# Patient Record
Sex: Male | Born: 1945 | Race: White | Hispanic: No | Marital: Married | Smoking: Former smoker
Health system: Southern US, Community
[De-identification: ages and names within clinical notes are randomized; demographics above are authoritative.]

## PROBLEM LIST (undated history)

## (undated) DIAGNOSIS — T4145XA Adverse effect of unspecified anesthetic, initial encounter: Secondary | ICD-10-CM

## (undated) DIAGNOSIS — M60852 Other myositis, left thigh: Secondary | ICD-10-CM

## (undated) DIAGNOSIS — Z950 Presence of cardiac pacemaker: Secondary | ICD-10-CM

## (undated) DIAGNOSIS — I350 Nonrheumatic aortic (valve) stenosis: Secondary | ICD-10-CM

## (undated) DIAGNOSIS — G8929 Other chronic pain: Secondary | ICD-10-CM

## (undated) DIAGNOSIS — I4891 Unspecified atrial fibrillation: Secondary | ICD-10-CM

## (undated) DIAGNOSIS — I10 Essential (primary) hypertension: Secondary | ICD-10-CM

## (undated) DIAGNOSIS — R7989 Other specified abnormal findings of blood chemistry: Secondary | ICD-10-CM

## (undated) DIAGNOSIS — T8859XA Other complications of anesthesia, initial encounter: Secondary | ICD-10-CM

## (undated) DIAGNOSIS — J449 Chronic obstructive pulmonary disease, unspecified: Secondary | ICD-10-CM

## (undated) DIAGNOSIS — R040 Epistaxis: Secondary | ICD-10-CM

## (undated) DIAGNOSIS — I25810 Atherosclerosis of coronary artery bypass graft(s) without angina pectoris: Secondary | ICD-10-CM

## (undated) DIAGNOSIS — J45909 Unspecified asthma, uncomplicated: Secondary | ICD-10-CM

## (undated) DIAGNOSIS — N179 Acute kidney failure, unspecified: Secondary | ICD-10-CM

## (undated) DIAGNOSIS — E119 Type 2 diabetes mellitus without complications: Secondary | ICD-10-CM

## (undated) DIAGNOSIS — D649 Anemia, unspecified: Secondary | ICD-10-CM

## (undated) DIAGNOSIS — R42 Dizziness and giddiness: Secondary | ICD-10-CM

## (undated) DIAGNOSIS — R011 Cardiac murmur, unspecified: Secondary | ICD-10-CM

## (undated) DIAGNOSIS — Z9289 Personal history of other medical treatment: Secondary | ICD-10-CM

## (undated) DIAGNOSIS — I499 Cardiac arrhythmia, unspecified: Secondary | ICD-10-CM

## (undated) DIAGNOSIS — K7460 Unspecified cirrhosis of liver: Secondary | ICD-10-CM

## (undated) DIAGNOSIS — M5030 Other cervical disc degeneration, unspecified cervical region: Secondary | ICD-10-CM

## (undated) DIAGNOSIS — M5136 Other intervertebral disc degeneration, lumbar region: Secondary | ICD-10-CM

## (undated) DIAGNOSIS — Z9981 Dependence on supplemental oxygen: Secondary | ICD-10-CM

## (undated) DIAGNOSIS — I5033 Acute on chronic diastolic (congestive) heart failure: Secondary | ICD-10-CM

## (undated) DIAGNOSIS — I442 Atrioventricular block, complete: Secondary | ICD-10-CM

## (undated) DIAGNOSIS — E871 Hypo-osmolality and hyponatremia: Secondary | ICD-10-CM

## (undated) DIAGNOSIS — E785 Hyperlipidemia, unspecified: Secondary | ICD-10-CM

## (undated) DIAGNOSIS — J189 Pneumonia, unspecified organism: Secondary | ICD-10-CM

## (undated) DIAGNOSIS — R945 Abnormal results of liver function studies: Secondary | ICD-10-CM

## (undated) DIAGNOSIS — K861 Other chronic pancreatitis: Secondary | ICD-10-CM

## (undated) DIAGNOSIS — R0602 Shortness of breath: Secondary | ICD-10-CM

## (undated) DIAGNOSIS — M069 Rheumatoid arthritis, unspecified: Secondary | ICD-10-CM

## (undated) DIAGNOSIS — Z952 Presence of prosthetic heart valve: Secondary | ICD-10-CM

## (undated) DIAGNOSIS — K219 Gastro-esophageal reflux disease without esophagitis: Secondary | ICD-10-CM

## (undated) DIAGNOSIS — I4892 Unspecified atrial flutter: Secondary | ICD-10-CM

## (undated) DIAGNOSIS — M1990 Unspecified osteoarthritis, unspecified site: Secondary | ICD-10-CM

## (undated) DIAGNOSIS — K766 Portal hypertension: Secondary | ICD-10-CM

## (undated) DIAGNOSIS — I2510 Atherosclerotic heart disease of native coronary artery without angina pectoris: Secondary | ICD-10-CM

## (undated) DIAGNOSIS — I5032 Chronic diastolic (congestive) heart failure: Secondary | ICD-10-CM

## (undated) DIAGNOSIS — M549 Dorsalgia, unspecified: Secondary | ICD-10-CM

## (undated) DIAGNOSIS — Z951 Presence of aortocoronary bypass graft: Secondary | ICD-10-CM

## (undated) HISTORY — PX: BACK SURGERY: SHX140

## (undated) HISTORY — DX: Abnormal results of liver function studies: R94.5

## (undated) HISTORY — DX: Chronic obstructive pulmonary disease, unspecified: J44.9

## (undated) HISTORY — DX: Other specified abnormal findings of blood chemistry: R79.89

## (undated) HISTORY — PX: INSERT / REPLACE / REMOVE PACEMAKER: SUR710

## (undated) HISTORY — DX: Rheumatoid arthritis, unspecified: M06.9

## (undated) HISTORY — DX: Essential (primary) hypertension: I10

---
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
## 1997-03-27 DIAGNOSIS — Z951 Presence of aortocoronary bypass graft: Secondary | ICD-10-CM

## 1997-03-27 HISTORY — DX: Presence of aortocoronary bypass graft: Z95.1

## 1997-03-27 HISTORY — PX: CORONARY ARTERY BYPASS GRAFT: SHX141

## 2006-11-02 ENCOUNTER — Ambulatory Visit (HOSPITAL_COMMUNITY): Admission: RE | Admit: 2006-11-02 | Discharge: 2006-11-02 | Payer: Self-pay | Admitting: Neurosurgery

## 2006-12-02 HISTORY — PX: ANTERIOR CERVICAL DECOMP/DISCECTOMY FUSION: SHX1161

## 2006-12-23 ENCOUNTER — Inpatient Hospital Stay (HOSPITAL_COMMUNITY): Admission: RE | Admit: 2006-12-23 | Discharge: 2006-12-25 | Payer: Self-pay | Admitting: Neurosurgery

## 2007-05-04 HISTORY — PX: POSTERIOR LAMINECTOMY / DECOMPRESSION CERVICAL SPINE: SUR739

## 2007-05-19 ENCOUNTER — Inpatient Hospital Stay (HOSPITAL_COMMUNITY): Admission: RE | Admit: 2007-05-19 | Discharge: 2007-05-21 | Payer: Self-pay | Admitting: Neurosurgery

## 2008-05-03 DIAGNOSIS — M51369 Other intervertebral disc degeneration, lumbar region without mention of lumbar back pain or lower extremity pain: Secondary | ICD-10-CM

## 2008-05-03 DIAGNOSIS — I25810 Atherosclerosis of coronary artery bypass graft(s) without angina pectoris: Secondary | ICD-10-CM

## 2008-05-03 DIAGNOSIS — M5136 Other intervertebral disc degeneration, lumbar region: Secondary | ICD-10-CM

## 2008-05-03 HISTORY — DX: Atherosclerosis of coronary artery bypass graft(s) without angina pectoris: I25.810

## 2008-05-03 HISTORY — DX: Other intervertebral disc degeneration, lumbar region without mention of lumbar back pain or lower extremity pain: M51.369

## 2008-05-03 HISTORY — DX: Other intervertebral disc degeneration, lumbar region: M51.36

## 2008-05-06 ENCOUNTER — Encounter: Payer: Self-pay | Admitting: Physician Assistant

## 2008-05-06 ENCOUNTER — Encounter: Payer: Self-pay | Admitting: Cardiology

## 2008-05-07 ENCOUNTER — Encounter: Payer: Self-pay | Admitting: Physician Assistant

## 2008-05-10 ENCOUNTER — Encounter: Payer: Self-pay | Admitting: Physician Assistant

## 2008-06-28 ENCOUNTER — Observation Stay (HOSPITAL_COMMUNITY): Admission: EM | Admit: 2008-06-28 | Discharge: 2008-06-28 | Payer: Self-pay | Admitting: Emergency Medicine

## 2008-12-24 ENCOUNTER — Ambulatory Visit: Payer: Self-pay | Admitting: Family Medicine

## 2008-12-24 ENCOUNTER — Ambulatory Visit: Payer: Self-pay | Admitting: Cardiovascular Disease

## 2008-12-24 ENCOUNTER — Inpatient Hospital Stay (HOSPITAL_COMMUNITY): Admission: AD | Admit: 2008-12-24 | Discharge: 2009-01-03 | Payer: Self-pay | Admitting: Neurosurgery

## 2008-12-25 ENCOUNTER — Encounter: Payer: Self-pay | Admitting: Cardiovascular Disease

## 2008-12-27 ENCOUNTER — Ambulatory Visit: Payer: Self-pay | Admitting: Thoracic Surgery (Cardiothoracic Vascular Surgery)

## 2008-12-28 ENCOUNTER — Encounter: Payer: Self-pay | Admitting: Cardiovascular Disease

## 2008-12-31 ENCOUNTER — Encounter: Payer: Self-pay | Admitting: Family Medicine

## 2009-01-02 ENCOUNTER — Encounter: Payer: Self-pay | Admitting: Cardiology

## 2009-01-02 HISTORY — PX: CORONARY ANGIOPLASTY WITH STENT PLACEMENT: SHX49

## 2009-01-03 ENCOUNTER — Encounter: Payer: Self-pay | Admitting: Cardiology

## 2009-01-15 ENCOUNTER — Ambulatory Visit: Payer: Self-pay | Admitting: Cardiology

## 2009-01-15 DIAGNOSIS — I25810 Atherosclerosis of coronary artery bypass graft(s) without angina pectoris: Secondary | ICD-10-CM | POA: Insufficient documentation

## 2009-01-15 DIAGNOSIS — M5430 Sciatica, unspecified side: Secondary | ICD-10-CM | POA: Insufficient documentation

## 2009-01-15 DIAGNOSIS — I4892 Unspecified atrial flutter: Secondary | ICD-10-CM | POA: Insufficient documentation

## 2009-05-03 HISTORY — PX: LUMBAR DISC SURGERY: SHX700

## 2009-05-06 ENCOUNTER — Encounter: Payer: Self-pay | Admitting: Cardiology

## 2009-05-13 ENCOUNTER — Encounter: Payer: Self-pay | Admitting: Cardiology

## 2009-06-16 ENCOUNTER — Ambulatory Visit: Payer: Self-pay | Admitting: Cardiology

## 2009-07-14 ENCOUNTER — Telehealth (INDEPENDENT_AMBULATORY_CARE_PROVIDER_SITE_OTHER): Payer: Self-pay | Admitting: *Deleted

## 2009-08-01 ENCOUNTER — Inpatient Hospital Stay (HOSPITAL_COMMUNITY): Admission: RE | Admit: 2009-08-01 | Discharge: 2009-08-03 | Payer: Self-pay | Admitting: Neurosurgery

## 2009-10-31 ENCOUNTER — Encounter: Payer: Self-pay | Admitting: Cardiology

## 2009-12-05 ENCOUNTER — Encounter: Payer: Self-pay | Admitting: Cardiology

## 2010-02-25 ENCOUNTER — Ambulatory Visit: Payer: Self-pay | Admitting: Cardiology

## 2010-02-25 DIAGNOSIS — Z8639 Personal history of other endocrine, nutritional and metabolic disease: Secondary | ICD-10-CM

## 2010-02-25 DIAGNOSIS — Z862 Personal history of diseases of the blood and blood-forming organs and certain disorders involving the immune mechanism: Secondary | ICD-10-CM | POA: Insufficient documentation

## 2010-05-24 ENCOUNTER — Encounter: Payer: Self-pay | Admitting: Neurosurgery

## 2010-06-02 NOTE — Letter (Signed)
Summary: External Correspondence/ OFFICE NOTE DR. DANIEL  External Correspondence/ OFFICE NOTE DR. DANIEL   Imported By: Dorise Hiss 05/15/2009 16:44:00  _____________________________________________________________________  External Attachment:    Type:   Image     Comment:   External Document

## 2010-06-02 NOTE — Letter (Signed)
Summary: [facility]  [facility]   Imported By: Claudette Laws 12/25/2008 08:41:08  _____________________________________________________________________  External Attachment:    Type:   Image     Comment:   External Document

## 2010-06-02 NOTE — Assessment & Plan Note (Signed)
Summary: 3 MONTH FU-NEEDS SURGICAL CLEARANCE FOR BACK SURGERY   Visit Type:  Follow-up Primary Provider:  Donzetta Sprung  CC:  follow-up visit and need surgical clearance for back surgery.  History of Present Illness: the patient is a 65 year old male this her coronary bypass grafting in 1998. The patient required back surgery but this was postponed due to an abnormal Cardiolite. Subsequently the patient underwent bare-metal stent placement to the vein graft of right coronary artery. The patient reports no recurrent chest pain. He has had no palpitations. The patient previously had a brief episode of atrial flutter. He however remains in normal sinus rhythm. The patient stated he is very limited in his activity due to his severe back pain. His valve and at least 5 months on Plavix. He denies any orthopnea PND. He does report easy bruisability. I noted to the patient that these both on aspirin Plavix as well as Fish oil and vitamin E. all which can lead to easy bruisability. From a cardiovascular standpoint however he has remained stable his EKG today shows normal sinus rhythm.the patient also reports that he had several falls not because of loss of consciousness but do to date instability and weakness in his lower extremities related to his back.  Preventive Screening-Counseling & Management  Alcohol-Tobacco     Smoking Status: quit     Year Quit: 1998     Pipe use/week: chew tobacco     Cans of tobacco/week: 6pks/wk     Tobacco Counseling: to remain off tobacco products  Clinical Review Panels:  CXR CXR results  Artifact overlies the chest.  The patient has had   previous median sternotomy and CABG.  There are slightly increased   chronic lung markings but no evidence of active infiltrate, mass,   effusion or collapse.  Ordinary degenerative changes effect the   spine.    IMPRESSION:   Prior CABG.  Chronic lung markings.  No active disease evident.  (12/28/2008)  Echocardiogram Echocardiogram   1. Left ventricle: The cavity size was normal. Wall thickness was      increased in a pattern of mild LVH. Systolic function was normal.      The estimated ejection fraction was in the range of 55% to 65%.   2. Atrial septum: No defect or patent foramen ovale was identified.   3. Impressions: No evidence of SBE but subopitmal images   Impressions:    - No evidence of SBE but subopitmal images   Transthoracic echocardiography. M-mode, complete 2D, spectral   Doppler, and color Doppler. Height: Height: 180.3cm. Height: 71in.   Weight: Weight: 121kg. Weight: 266.2lb. Body mass index: BMI:   37.2kg/m^2. Body surface area: BSA: 2.69m^2. Patient status:   Inpatient. Location: Bedside. (12/31/2008)  Cardiac Imaging Cardiac Cath Findings  RESULTS:  Initially, the stenosis in the midportion of the right   coronary artery was estimated at 95%.  Following stenting, this improved   to 0%.      CONCLUSION:  Successful percutaneous coronary intervention of the lesion   in the midportion of the vein graft to distal right coronary artery   using a bare-metal stent with distal protection with improvement of   center narrowing from 95% to 0%.      DISPOSITION:  The patient returned to [facility] room for further   observation.  We used a bare-metal stent and I think if the patient   requires back surgery this could be done anytime after 6 weeks and the  Plavix could be interrupted for that.      Bruce Elvera Lennox Juanda Chance, MD, [facility]  (01/02/2009)    Current Medications (verified): 1)  Lisinopril 40 Mg Tabs (Lisinopril) .... Take One Tablet By Mouth Daily 2)  Novolin 70/30 70-30 % Susp (Insulin Isophane & Regular) .... Inject 63 Units Subcutaneously Two Times A Day 3)  Crestor 5 Mg Tabs (Rosuvastatin Calcium) .... Take One Tablet By Mouth Every Other Day 4)  Metformin Hcl 1000 Mg Tabs (Metformin Hcl) .... Two Times A Day 5)  Combivent 18-103 Mcg/act Aero  (Ipratropium-Albuterol) .... As Needed 6)  Zetia 10 Mg Tabs (Ezetimibe) .... Take One Tablet By Mouth Daily. 7)  Metoprolol Succinate 50 Mg Xr24h-Tab (Metoprolol Succinate) .... Take One Tablet By Mouth Two Times A Day 8)  Prednisone 5 Mg Tabs (Prednisone) .... Take 1 Tablet By Mouth Two Times A Day 9)  Co Q-10 30 Mg  Caps (Coenzyme Q10) .... Once Daily 10)  Fish Oil   Oil (Fish Oil) .... Once Daily 11)  Aspirin 81 Mg Tbec (Aspirin) .... Take One Tablet By Mouth Daily 12)  Ipratropium-Albuterol 0.5-2.5 (3) Mg/31ml Soln (Ipratropium-Albuterol) .... Nebulizer-Up To 4 Times Daily As Needed 13)  Plavix 75 Mg Tabs (Clopidogrel Bisulfate) .... Take One Tablet By Mouth Daily 14)  Percocet 5-325 Mg Tabs (Oxycodone-Acetaminophen) .... Take 1 Tablet By Mouth Every 4-6 Hours As Needed 15)  Loratadine 10 Mg Tabs (Loratadine) .... Take 1 Tablet By Mouth Once A Day 16)  Ascorbic Acid 500 Mg Tabs (Ascorbic Acid) .... Take 1 Tablet By Mouth Two Times A Day 17)  Multivitamins  Tabs (Multiple Vitamin) .... Take 1 Tablet By Mouth Once A Day 18)  Vitamin E 400 Unit Caps (Vitamin E) .... Take 1 Tablet By Mouth Once A Day  Allergies (verified): No Known Drug Allergies  Comments:  Nurse/Medical Assistant: The patient's medications and allergies were reviewed with the patient and were updated in the Medication and Allergy Lists. Bottles reviewed.  Past History:  Past Surgical History: Last updated: 01/14/2009 Appendectomy CABG  Anterior cervical decompression with posterior decompression and       fusion.   Social History: Last updated: 01/14/2009 Retired  Married  Tobacco Use - Former.  Alcohol Use - no  Risk Factors: Smoking Status: quit (06/16/2009) Pipe Use/wk: chew tobacco (06/16/2009) Cans of tobacco/wk: 6pks/wk (06/16/2009)  Past Medical History: status post bare-metal stent to the vein graft to the right coronary artery. September 2010 Normal LV function Rheumatoid  arthritis. COPD  Family History: Reviewed history and no changes required. noncontributory  Social History: Smoking Status:  quit Cans of tobacco/week:  6pks/wk Pipe use/week:  chew tobacco  Review of Systems       The patient complains of muscle weakness and joint pain.  The patient denies fatigue, malaise, fever, weight gain/loss, vision loss, decreased hearing, hoarseness, chest pain, palpitations, shortness of breath, prolonged cough, wheezing, sleep apnea, coughing up blood, abdominal pain, blood in stool, nausea, vomiting, diarrhea, heartburn, incontinence, blood in urine, leg swelling, rash, skin lesions, headache, fainting, dizziness, depression, anxiety, enlarged lymph nodes, easy bruising or bleeding, and environmental allergies.    Vital Signs:  Patient profile:   65 year old male Height:      72 inches Weight:      279 pounds Pulse rate:   86 / minute BP sitting:   112 / 76  (left arm) Cuff size:   large  Vitals Entered By: Carlye Grippe (June 16, 2009 3:25 PM)  CC: follow-up visit,need surgical clearance for back surgery   Physical Exam  Additional Exam:  General: Well-developed, well-nourished in no distress head: Normocephalic and atraumatic eyes PERRLA/EOMI intact, conjunctiva and lids normal nose: No deformity or lesions mouth normal dentition, normal posterior pharynx neck: Supple, no JVD.  No masses, thyromegaly or abnormal cervical nodes. No carotid bruits lungs: Normal breath sounds bilaterally without wheezing.  Normal percussion heart: regular rate and rhythm with normal S1 and S2, no S3 or S4.  PMI is normal.  No pathological murmurs abdomen: Normal bowel sounds, abdomen is soft and nontender without masses, organomegaly or hernias noted.  No hepatosplenomegaly musculoskeletal: patient walks with a cane. He has significant gait instability. pulsus: Pulse is normal in all 4 extremities Extremities: No peripheral pitting edema neurologic: Alert and  oriented x 3 skin: Intact without lesions or rashes cervical nodes: No significant adenopathy psychologic: Normal affect    EKG  Procedure date:  06/16/2009  Findings:      normal sinus rhythm. Incomplete right bundle branch block. Heart rate 86 beats per minute  Impression & Recommendations:  Problem # 1:  ATRIAL FLUTTER (ICD-427.32) no evidence of recurrent atrial flutter. At this point there is no indication to place patient Coumadin, particularly with upcoming back surgery. His updated medication list for this problem includes:    Metoprolol Succinate 50 Mg Xr24h-tab (Metoprolol succinate) .Marland Kitchen... Take one tablet by mouth two times a day    Aspirin 81 Mg Tbec (Aspirin) .Marland Kitchen... Take one tablet by mouth daily    Plavix 75 Mg Tabs (Clopidogrel bisulfate) .Marland Kitchen... Take one tablet by mouth daily  Orders: EKG w/ Interpretation (93000)  Problem # 2:  SCIATICA (ICD-724.3) the patient has severe disability related to his back problem. He has remained stable from a cardiovascular perspective he should not be able to proceed with back surgery. I asked the patient to stop his fish oil as well as his vitamin E, both which have anticoagulant properties. The patient will also stop Plavix 7 days before surgery and then resume Plavix whenever Dr. Channing Mutters feels it's safe to resume.we will notify Dr. 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 He reports no significant recurrent chest pain. His updated medication list for this problem includes:    Lisinopril 40 Mg Tabs (Lisinopril) .Marland Kitchen... Take one tablet by mouth daily    Metoprolol Succinate 50 Mg Xr24h-tab (Metoprolol succinate) .Marland Kitchen... Take one tablet by mouth two times a day    Aspirin 81 Mg Tbec (Aspirin) .Marland Kitchen... Take one tablet by mouth daily    Plavix 75 Mg Tabs (Clopidogrel  bisulfate) .Marland Kitchen... Take one tablet by mouth daily  Patient Instructions: 1)  Stop Plavix 7 days before surgery  2)  Stop Vitamin E and Fish Oil while on Plavix 3)  Follow up in  6 months.    Appended Document: 3 MONTH FU-NEEDS SURGICAL CLEARANCE FOR BACK SURGERY please forward to Dr. Channing Mutters. Patient is clear for back surgery.

## 2010-06-02 NOTE — Assessment & Plan Note (Signed)
Summary: 6 MO FU AUG REMINDER-SRS   Visit Type:  Follow-up Primary Provider:  Donzetta Sprung   History of Present Illness: the patient is a 65 year old male with history of coronary bypass grafting in 1998. The patient in August of 2000 and then underwent bare-metal stent placement the vein graft to the right coronary artery. Because it was felt that he needed back surgery fairly soon a bare-metal stent was placed instead of a drug-eluting stent. However the patient's back surgery was delayed and he underwent surgery in April 2011. He had significant sciatica which remains present but with some improvement in his overall pain syndrome. The patient has rheumatoid arthritis and is on infliximab infusions every other month. 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 Apparently Dr. Reuel Boom has drawn some blood last week and asked him to follow up with his to make sure that he is no further elevation in the transaminases. Despite the fact that the patient had a bare-metal stent placed in his vein graft I recommended to him that we continue on aspirin and Plavix and will antiplatelet therapy. The patient has continued multiple risk factors and he has no complications with Plavix.  He also has a history of atrial flutter but reports no palpitations and has remained in normal sinus rhythm. An EKG was obtained in the office today and reviewed.   The patient denies any chest pain he does have shortness of breath due to deconditioning but this pattern is unchanged he also has no palpitations he has no orthopnea PND and reports no lower extremity edema.  Preventive Screening-Counseling & Management  Alcohol-Tobacco     Smoking Status: quit     Year Quit: 1998     Cans of tobacco/week: chews 4 packs tobacco/week     Tobacco Counseling: not to resume use of tobacco products  Current Medications (verified): 1)  Lisinopril 40 Mg  Tabs (Lisinopril) .... Take One Tablet By Mouth Daily 2)  Novolin 70/30 70-30 % Susp (Insulin Isophane & Regular) .... Inject 63 Units Subcutaneously Two Times A Day 3)  Crestor 5 Mg Tabs (Rosuvastatin Calcium) .... Take One Tablet By Mouth Every Other Day 4)  Metformin Hcl 1000 Mg Tabs (Metformin Hcl) .... Two Times A Day 5)  Combivent 18-103 Mcg/act Aero (Ipratropium-Albuterol) .... As Needed 6)  Zetia 10 Mg Tabs (Ezetimibe) .... Take One Tablet By Mouth Daily. 7)  Metoprolol Succinate 50 Mg Xr24h-Tab (Metoprolol Succinate) .... Take One Tablet By Mouth Two Times A Day 8)  Prednisone 5 Mg Tabs (Prednisone) .... Take 1 Tablet By Mouth Two Times A Day 9)  Co Q-10 30 Mg  Caps (Coenzyme Q10) .... Once Daily 10)  Fish Oil   Oil (Fish Oil) .... Once Daily 11)  Aspirin 81 Mg Tbec (Aspirin) .... Take One Tablet By Mouth Daily 12)  Ipratropium-Albuterol 0.5-2.5 (3) Mg/74ml Soln (Ipratropium-Albuterol) .... Nebulizer-Up To 4 Times Daily As Needed 13)  Plavix 75 Mg Tabs (Clopidogrel Bisulfate) .... Take One Tablet By Mouth Daily 14)  Percocet 5-325 Mg Tabs (Oxycodone-Acetaminophen) .... Take 1 Tablet By Mouth Every 4-6 Hours As Needed 15)  Loratadine 10 Mg Tabs (Loratadine) .... Take 1 Tablet By Mouth Once A Day 16)  Ascorbic Acid 500 Mg Tabs (Ascorbic Acid) .... Take 1 Tablet By Mouth Two Times A Day 17)  Multivitamins  Tabs (Multiple Vitamin) .... Take 1 Tablet By Mouth Once A Day 18)  Vitamin E 400 Unit Caps (Vitamin E) .... Take 1 Tablet By Mouth Once A Day 19)  Remicade 100 Mg Solr (Infliximab) .... Infusion Every Other Month For Rheumatoid Arthritis  Allergies (verified): No Known Drug Allergies  Comments:  Nurse/Medical Assistant: The patient is currently on medications but does not know the name or dosage at this time. Instructed to contact our office with details. Will update medication list at that time.  Past History:  Past Medical History: status post bare-metal stent to the vein  graft to the right coronary artery. September 2010 Normal LV function Rheumatoid arthritis.infliximab infusions every other month COPD Elevated liver function tests  Past Surgical History: Appendectomy CABG 1998  Anterior cervical decompression with posterior decompression and       fusion.   Social History: Cans of tobacco/week:  chews 4 packs tobacco/week  Review of Systems       The patient complains of weight gain/loss, shortness of breath, prolonged cough, and joint pain.  The patient denies fatigue, malaise, fever, vision loss, decreased hearing, hoarseness, chest pain, palpitations, wheezing, sleep apnea, coughing up blood, abdominal pain, blood in stool, nausea, vomiting, diarrhea, heartburn, incontinence, blood in urine, muscle weakness, leg swelling, rash, skin lesions, headache, fainting, dizziness, depression, anxiety, enlarged lymph nodes, easy bruising or bleeding, and environmental allergies.    Vital Signs:  Patient profile:   65 year old male Height:      72 inches Weight:      290 pounds BMI:     39.47 Pulse rate:   87 / minute BP sitting:   117 / 68  (left arm) Cuff size:   large  Vitals Entered By: Carlye Grippe (February 25, 2010 10:10 AM)  Nutrition Counseling: Patient's BMI is greater than 25 and therefore counseled on weight management options.  Physical Exam  Additional Exam:  General: Well-developed, well-nourished in no distress head: Normocephalic and atraumatic eyes PERRLA/EOMI intact, conjunctiva and lids normal nose: No deformity or lesions mouth normal dentition, normal posterior pharynx neck: Supple, no JVD.  No masses, thyromegaly or abnormal cervical nodes. No carotid bruits lungs: Normal breath sounds bilaterally without wheezing.  Normal percussion heart: regular rate and rhythm with normal S1 and S2, no S3 or S4.  PMI is normal.  No pathological murmurs abdomen: Normal bowel sounds, abdomen is soft and nontender without masses,  organomegaly or hernias noted.  No hepatosplenomegaly musculoskeletal: patient walks with a cane. He has significant gait instability. pulsus: Pulse is normal in all 4 extremities Extremities: No peripheral pitting edema neurologic: Alert and oriented x 3 skin: Intact without lesions or rashes cervical nodes: No significant adenopathy psychologic: Normal affect    EKG  Procedure date:  02/25/2010  Findings:      normal sinus rhythm. Incomplete right bundle branch block. Nonspecific ST segment changes. 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 Despite this I have recommended that he continues on dual antiplatelet therapy. His updated medication list for this problem includes:    Lisinopril 40 Mg Tabs (Lisinopril) .Marland Kitchen... Take one tablet by mouth daily    Metoprolol Succinate 50 Mg Xr24h-tab (Metoprolol succinate) .Marland Kitchen... Take one tablet by mouth two times a day    Aspirin 81 Mg Tbec (Aspirin) .Marland Kitchen... Take one tablet  by mouth daily    Plavix 75 Mg Tabs (Clopidogrel bisulfate) .Marland Kitchen... Take one tablet by mouth daily  Problem # 3:  ATRIAL FLUTTER (ICD-427.32) no recurrence of atrial flutter no palpitations EKG demonstrates normal sinus rhythm. His updated medication list for this problem includes:    Metoprolol Succinate 50 Mg Xr24h-tab (Metoprolol succinate) .Marland Kitchen... Take one tablet by mouth two times a day    Aspirin 81 Mg Tbec (Aspirin) .Marland Kitchen... Take one tablet by mouth daily    Plavix 75 Mg Tabs (Clopidogrel bisulfate) .Marland Kitchen... Take one tablet by mouth daily  Orders: EKG w/ Interpretation (93000)  Problem # 4:  LIVER FUNCTION TESTS, ABNORMAL, HX OF (ICD-V12.2) the patient has mild elevation in his transaminases likely related to his  infusion with infliximab. I asked him to followup with Dr. Garner Nash and keep an eye on his liver function tests. I'm not sure if hepatitis screen was done in the past but we'll leave this decision up to Dr. Garner Nash.  Patient Instructions: 1)  Your physician recommends that you continue on your current medications as directed. Please refer to the Current Medication list given to you today. 2)  Follow up in  6 months

## 2010-06-02 NOTE — Progress Notes (Signed)
  [place] from [facility] [place] [facility]  July 14, 2009 2:36 PM

## 2010-07-22 LAB — GLUCOSE, CAPILLARY
Glucose-Capillary: 236 mg/dL — ABNORMAL HIGH (ref 70–99)
Glucose-Capillary: 276 mg/dL — ABNORMAL HIGH (ref 70–99)
Glucose-Capillary: 289 mg/dL — ABNORMAL HIGH (ref 70–99)

## 2010-07-22 LAB — TYPE AND SCREEN
ABO/RH(D): O POS
Antibody Screen: NEGATIVE

## 2010-07-26 LAB — URINALYSIS, ROUTINE W REFLEX MICROSCOPIC
Bilirubin Urine: NEGATIVE
Ketones, ur: NEGATIVE mg/dL
Nitrite: NEGATIVE
Specific Gravity, Urine: 1.024 (ref 1.005–1.030)
Urobilinogen, UA: 1 mg/dL (ref 0.0–1.0)

## 2010-07-26 LAB — DIFFERENTIAL
Basophils Absolute: 0.1 10*3/uL (ref 0.0–0.1)
Basophils Relative: 1 % (ref 0–1)
Monocytes Relative: 10 % (ref 3–12)
Neutro Abs: 7.2 10*3/uL (ref 1.7–7.7)
Neutrophils Relative %: 65 % (ref 43–77)

## 2010-07-26 LAB — COMPREHENSIVE METABOLIC PANEL
Alkaline Phosphatase: 97 U/L (ref 39–117)
BUN: 17 mg/dL (ref 6–23)
Calcium: 9.7 mg/dL (ref 8.4–10.5)
Creatinine, Ser: 0.85 mg/dL (ref 0.4–1.5)
Glucose, Bld: 103 mg/dL — ABNORMAL HIGH (ref 70–99)
Potassium: 4.5 mEq/L (ref 3.5–5.1)
Total Protein: 6.8 g/dL (ref 6.0–8.3)

## 2010-07-26 LAB — CBC
HCT: 43 % (ref 39.0–52.0)
Hemoglobin: 14.4 g/dL (ref 13.0–17.0)
MCHC: 33.5 g/dL (ref 30.0–36.0)
MCV: 91 fL (ref 78.0–100.0)
RDW: 15.5 % (ref 11.5–15.5)

## 2010-07-26 LAB — PROTIME-INR: INR: 0.98 (ref 0.00–1.49)

## 2010-07-26 LAB — APTT: aPTT: 31 seconds (ref 24–37)

## 2010-08-07 LAB — CBC
HCT: 39.7 % (ref 39.0–52.0)
HCT: 40.3 % (ref 39.0–52.0)
Hemoglobin: 13.1 g/dL (ref 13.0–17.0)
MCHC: 33 g/dL (ref 30.0–36.0)
MCV: 90.6 fL (ref 78.0–100.0)
MCV: 90.9 fL (ref 78.0–100.0)
Platelets: 201 10*3/uL (ref 150–400)
Platelets: 209 10*3/uL (ref 150–400)
RBC: 4.45 MIL/uL (ref 4.22–5.81)
RDW: 14.2 % (ref 11.5–15.5)
RDW: 14.3 % (ref 11.5–15.5)
WBC: 9.5 10*3/uL (ref 4.0–10.5)

## 2010-08-07 LAB — GLUCOSE, CAPILLARY
Glucose-Capillary: 104 mg/dL — ABNORMAL HIGH (ref 70–99)
Glucose-Capillary: 104 mg/dL — ABNORMAL HIGH (ref 70–99)
Glucose-Capillary: 145 mg/dL — ABNORMAL HIGH (ref 70–99)
Glucose-Capillary: 152 mg/dL — ABNORMAL HIGH (ref 70–99)
Glucose-Capillary: 164 mg/dL — ABNORMAL HIGH (ref 70–99)
Glucose-Capillary: 183 mg/dL — ABNORMAL HIGH (ref 70–99)
Glucose-Capillary: 190 mg/dL — ABNORMAL HIGH (ref 70–99)
Glucose-Capillary: 90 mg/dL (ref 70–99)

## 2010-08-07 LAB — BASIC METABOLIC PANEL
BUN: 10 mg/dL (ref 6–23)
CO2: 26 mEq/L (ref 19–32)
Calcium: 8.9 mg/dL (ref 8.4–10.5)
Calcium: 9.1 mg/dL (ref 8.4–10.5)
Chloride: 101 mEq/L (ref 96–112)
Creatinine, Ser: 0.86 mg/dL (ref 0.4–1.5)
GFR calc Af Amer: >60 mL/min (ref >60)
GFR calc non Af Amer: >60 mL/min (ref >60)
Glucose, Bld: 127 mg/dL — ABNORMAL HIGH (ref 70–99)
Glucose, Bld: 138 mg/dL — ABNORMAL HIGH (ref 70–99)
Potassium: 3.8 mEq/L (ref 3.5–5.1)
Sodium: 133 mEq/L — ABNORMAL LOW (ref 135–145)

## 2010-08-07 LAB — VANCOMYCIN, TROUGH: Vancomycin Tr: 20.5 ug/mL — ABNORMAL HIGH (ref 10.0–20.0)

## 2010-08-08 LAB — BASIC METABOLIC PANEL
BUN: 26 mg/dL — ABNORMAL HIGH (ref 6–23)
BUN: 36 mg/dL — ABNORMAL HIGH (ref 6–23)
CO2: 24 mEq/L (ref 19–32)
Chloride: 94 mEq/L — ABNORMAL LOW (ref 96–112)
Chloride: 96 mEq/L (ref 96–112)
Creatinine, Ser: 0.98 mg/dL (ref 0.4–1.5)
GFR calc Af Amer: >60 mL/min (ref >60)
GFR calc Af Amer: >60 mL/min (ref >60)
GFR calc non Af Amer: >60 mL/min (ref >60)
Glucose, Bld: 372 mg/dL — ABNORMAL HIGH (ref 70–99)
Potassium: 3.9 mEq/L (ref 3.5–5.1)
Potassium: 4.4 mEq/L (ref 3.5–5.1)
Potassium: 4.5 mEq/L (ref 3.5–5.1)
Sodium: 134 mEq/L — ABNORMAL LOW (ref 135–145)

## 2010-08-08 LAB — GLUCOSE, CAPILLARY
Glucose-Capillary: 128 mg/dL — ABNORMAL HIGH (ref 70–99)
Glucose-Capillary: 137 mg/dL — ABNORMAL HIGH (ref 70–99)
Glucose-Capillary: 151 mg/dL — ABNORMAL HIGH (ref 70–99)
Glucose-Capillary: 160 mg/dL — ABNORMAL HIGH (ref 70–99)
Glucose-Capillary: 162 mg/dL — ABNORMAL HIGH (ref 70–99)
Glucose-Capillary: 163 mg/dL — ABNORMAL HIGH (ref 70–99)
Glucose-Capillary: 174 mg/dL — ABNORMAL HIGH (ref 70–99)
Glucose-Capillary: 195 mg/dL — ABNORMAL HIGH (ref 70–99)
Glucose-Capillary: 224 mg/dL — ABNORMAL HIGH (ref 70–99)
Glucose-Capillary: 236 mg/dL — ABNORMAL HIGH (ref 70–99)
Glucose-Capillary: 268 mg/dL — ABNORMAL HIGH (ref 70–99)
Glucose-Capillary: 301 mg/dL — ABNORMAL HIGH (ref 70–99)
Glucose-Capillary: 306 mg/dL — ABNORMAL HIGH (ref 70–99)
Glucose-Capillary: 320 mg/dL — ABNORMAL HIGH (ref 70–99)
Glucose-Capillary: 343 mg/dL — ABNORMAL HIGH (ref 70–99)
Glucose-Capillary: 353 mg/dL — ABNORMAL HIGH (ref 70–99)
Glucose-Capillary: 358 mg/dL — ABNORMAL HIGH (ref 70–99)
Glucose-Capillary: 420 mg/dL — ABNORMAL HIGH (ref 70–99)
Glucose-Capillary: 438 mg/dL — ABNORMAL HIGH (ref 70–99)
Glucose-Capillary: 77 mg/dL (ref 70–99)
Glucose-Capillary: 90 mg/dL (ref 70–99)

## 2010-08-08 LAB — CBC
HCT: 38.7 % — ABNORMAL LOW (ref 39.0–52.0)
HCT: 39.8 % (ref 39.0–52.0)
HCT: 42.7 % (ref 39.0–52.0)
HCT: 42.8 % (ref 39.0–52.0)
HCT: 43.1 % (ref 39.0–52.0)
Hemoglobin: 12.9 g/dL — ABNORMAL LOW (ref 13.0–17.0)
Hemoglobin: 14.2 g/dL (ref 13.0–17.0)
Hemoglobin: 14.5 g/dL (ref 13.0–17.0)
Hemoglobin: 15.1 g/dL (ref 13.0–17.0)
MCHC: 33.5 g/dL (ref 30.0–36.0)
MCHC: 33.6 g/dL (ref 30.0–36.0)
MCHC: 33.7 g/dL (ref 30.0–36.0)
MCHC: 34 g/dL (ref 30.0–36.0)
MCV: 91.3 fL (ref 78.0–100.0)
MCV: 91.5 fL (ref 78.0–100.0)
MCV: 91.6 fL (ref 78.0–100.0)
Platelets: 195 10*3/uL (ref 150–400)
Platelets: 201 10*3/uL (ref 150–400)
Platelets: 204 10*3/uL (ref 150–400)
Platelets: 212 10*3/uL (ref 150–400)
RBC: 4.23 MIL/uL (ref 4.22–5.81)
RBC: 4.7 MIL/uL (ref 4.22–5.81)
RBC: 4.7 MIL/uL (ref 4.22–5.81)
RDW: 13.4 % (ref 11.5–15.5)
RDW: 13.9 % (ref 11.5–15.5)
RDW: 14.1 % (ref 11.5–15.5)
RDW: 14.2 % (ref 11.5–15.5)
RDW: 14.2 % (ref 11.5–15.5)
WBC: 10.1 10*3/uL (ref 4.0–10.5)
WBC: 11.6 10*3/uL — ABNORMAL HIGH (ref 4.0–10.5)
WBC: 12 10*3/uL — ABNORMAL HIGH (ref 4.0–10.5)
WBC: 12.2 10*3/uL — ABNORMAL HIGH (ref 4.0–10.5)

## 2010-08-08 LAB — URINALYSIS, MICROSCOPIC ONLY
Bilirubin Urine: NEGATIVE
Hgb urine dipstick: NEGATIVE
Nitrite: NEGATIVE
Specific Gravity, Urine: 1.013 (ref 1.005–1.030)
pH: 6 (ref 5.0–8.0)

## 2010-08-08 LAB — DIFFERENTIAL
Basophils Absolute: 0.1 10*3/uL (ref 0.0–0.1)
Eosinophils Relative: 3 % (ref 0–5)
Lymphocytes Relative: 19 % (ref 12–46)
Monocytes Absolute: 1 10*3/uL (ref 0.1–1.0)
Monocytes Relative: 9 % (ref 3–12)
Neutro Abs: 7.9 10*3/uL — ABNORMAL HIGH (ref 1.7–7.7)

## 2010-08-08 LAB — URINALYSIS, ROUTINE W REFLEX MICROSCOPIC
Glucose, UA: >1000 mg/dL — AB
Ketones, ur: 15 mg/dL — AB
Leukocytes, UA: NEGATIVE
Nitrite: NEGATIVE
Specific Gravity, Urine: 1.029 (ref 1.005–1.030)
pH: 5.5 (ref 5.0–8.0)

## 2010-08-08 LAB — SYNOVIAL CELL COUNT + DIFF, W/ CRYSTALS
Crystals, Fluid: NONE SEEN
WBC, Synovial: 77005 /mm3 — ABNORMAL HIGH (ref 0–200)

## 2010-08-08 LAB — BLOOD GAS, ARTERIAL
Acid-base deficit: 0 mmol/L (ref 0.0–2.0)
Bicarbonate: 23.3 mEq/L (ref 20.0–24.0)
O2 Saturation: 95.6 %
pO2, Arterial: 75.9 mmHg — ABNORMAL LOW (ref 80.0–100.0)

## 2010-08-08 LAB — HEPARIN LEVEL (UNFRACTIONATED)
Heparin Unfractionated: 0.15 IU/mL — ABNORMAL LOW (ref 0.30–0.70)
Heparin Unfractionated: 0.24 IU/mL — ABNORMAL LOW (ref 0.30–0.70)
Heparin Unfractionated: 0.28 IU/mL — ABNORMAL LOW (ref 0.30–0.70)
Heparin Unfractionated: 0.28 IU/mL — ABNORMAL LOW (ref 0.30–0.70)
Heparin Unfractionated: 0.35 IU/mL (ref 0.30–0.70)
Heparin Unfractionated: 0.41 IU/mL (ref 0.30–0.70)
Heparin Unfractionated: 0.5 IU/mL (ref 0.30–0.70)

## 2010-08-08 LAB — COMPREHENSIVE METABOLIC PANEL
AST: 40 U/L — ABNORMAL HIGH (ref 0–37)
Albumin: 3.9 g/dL (ref 3.5–5.2)
Chloride: 97 mEq/L (ref 96–112)
Creatinine, Ser: 1.06 mg/dL (ref 0.4–1.5)
GFR calc Af Amer: >60 mL/min (ref >60)
Potassium: 4.2 mEq/L (ref 3.5–5.1)
Total Bilirubin: 0.6 mg/dL (ref 0.3–1.2)
Total Protein: 7.1 g/dL (ref 6.0–8.3)

## 2010-08-08 LAB — URINE MICROSCOPIC-ADD ON

## 2010-08-08 LAB — PROTIME-INR
INR: 1.1 (ref 0.00–1.49)
Prothrombin Time: 14.3 seconds (ref 11.6–15.2)

## 2010-08-08 LAB — APTT
aPTT: 25 seconds (ref 24–37)
aPTT: 29 seconds (ref 24–37)

## 2010-08-08 LAB — URINE CULTURE

## 2010-08-08 LAB — GLUCOSE, SYNOVIAL FLUID: Glucose, Synovial Fluid: 163 mg/dL

## 2010-08-08 LAB — RAPID STREP SCREEN (MED CTR MEBANE ONLY): Streptococcus, Group A Screen (Direct): NEGATIVE

## 2010-08-08 LAB — STREP A DNA PROBE: Group A Strep Probe: NEGATIVE

## 2010-08-08 LAB — PROTEIN, BODY FLUID

## 2010-08-08 LAB — HEMOGLOBIN A1C: Hgb A1c MFr Bld: 9.6 % — ABNORMAL HIGH (ref 4.6–6.1)

## 2010-08-18 LAB — BASIC METABOLIC PANEL
BUN: 32 mg/dL — ABNORMAL HIGH (ref 6–23)
CO2: 23 mEq/L (ref 19–32)
Calcium: 10.1 mg/dL (ref 8.4–10.5)
Creatinine, Ser: 1.53 mg/dL — ABNORMAL HIGH (ref 0.4–1.5)
GFR calc Af Amer: 56 mL/min — ABNORMAL LOW (ref >60)

## 2010-08-18 LAB — CBC
MCHC: 34.2 g/dL (ref 30.0–36.0)
Platelets: 215 10*3/uL (ref 150–400)
RBC: 5.17 MIL/uL (ref 4.22–5.81)

## 2010-08-18 LAB — DIFFERENTIAL
Basophils Absolute: 0.2 10*3/uL — ABNORMAL HIGH (ref 0.0–0.1)
Basophils Relative: 1 % (ref 0–1)
Eosinophils Absolute: 0.1 10*3/uL (ref 0.0–0.7)
Monocytes Relative: 10 % (ref 3–12)
Neutro Abs: 10.4 10*3/uL — ABNORMAL HIGH (ref 1.7–7.7)
Neutrophils Relative %: 76 % (ref 43–77)

## 2010-09-15 NOTE — Cardiovascular Report (Signed)
[name], [name]             ACCOUNT NO.:  [id number]   MEDICAL RECORD NO.:  [id number]          PATIENT TYPE:  INP   LOCATION:  2508                         FACILITY:  [facility]   PHYSICIAN:  Dennis R. Juanda Chance, MD, FACCDATE OF BIRTH:  [birth year]   DATE OF PROCEDURE:  01/02/2009  DATE OF DISCHARGE:                            CARDIAC CATHETERIZATION   CLINICAL HISTORY:  [name] is 65 year old and has had previous  bypass surgery in 1998.  He was admitted on Dr. Loraine Leriche Roy's service for  lumbar back surgery, but injured his right knee while waiting for  surgery and the surgery was postponed.  He was seen by Dr. Eden Emms in  consultation for preoperative evaluation and had a Myoview scan which  was abnormal and underwent catheterization why Dr. Riley Kill, which showed  a patent LIMA graft to the LAD, tight lesion in the distal limb of the  sequential vein graft to the marginal posterolateral branch of the  circumflex artery, although the native circulation to the posterolateral  branch was not severely obstructed, 50-70% lesion in the vein graft to  the diagonal branch of the LAD which was a small vessel, and a 95%  stenosis in the midportion of the vein graft to distal right coronary  artery.  Surgery was considered and Dr. Cornelius Moras saw him in consultation,  but it was felt that percutaneous intervention was a better choice.  After reviewing the films with Dr. Riley Kill and discussed with Dr.  Eden Emms, we thought the best approach was to stent the vein graft to the  right.  We did not feel the distal limb of the circumflex graft needed  intervention and the diagonal lesion was not clearly obstructing and a  small vessel and we did not feel this needed intervention.  His  procedure was delayed because he had fever and elevated white count due  to what was felt to be septic phlebitis.   PROCEDURE:  The procedure was performed via the right femoral artery  using an arterial sheath and a 6-French  right saphenous vein bypass  graft guiding catheter, 6-French with side holes.  The patient given  Angiomax bolus infusion.  He was loaded with Plavix and baby aspirin.  We passed a Prowater wire down the graft and crossed the lesion without  difficulty.  We then passed the spider filter which was a 4.0 filter  down to the distal portion of the graft.  We removed the Prowater wire  and advanced the filter to the distal graft and then deployed the filter  by removing the sheath.  We then directly stented the lesion with a 3.0  x 15 mm Vision stent deploying this with 1 inflation of 14 atmospheres  for 30 seconds.  The filter was then removed and final diagnostic  studies were performed through the guiding catheter.   The patient tolerated the procedure well and left Laboratory in  satisfactory condition.  The right femoral artery was not suitable for  closure due to a stick below the bifurcation.   RESULTS:  Initially, the stenosis in the midportion of the right  coronary  artery was estimated at 95%.  Following stenting, this improved  to 0%.   CONCLUSION:  Successful percutaneous coronary intervention of the lesion  in the midportion of the vein graft to distal right coronary artery  using a bare-metal stent with distal protection with improvement of  center narrowing from 95% to 0%.   DISPOSITION:  The patient returned to [facility] room for further  observation.  We used a bare-metal stent and I think if the patient  requires back surgery this could be done anytime after 6 weeks and the  Plavix could be interrupted for that.      Dennis Elvera Lennox Juanda Chance, MD, [facility]  Electronically Signed     BRB/MEDQ  D:  01/02/2009  T:  01/03/2009  Job:  [id number]   cc:   Dyann Kief. Eden Emms, MD, [facility]

## 2010-09-15 NOTE — H&P (Signed)
[name], [name]             ACCOUNT NO.:  [id number]   MEDICAL RECORD NO.:  [id number]          PATIENT TYPE:  INP   LOCATION:  3172                         FACILITY:  [facility]   PHYSICIAN:  Payton Doughty, M.D.      DATE OF BIRTH:  23-May-1945   DATE OF ADMISSION:  05/19/2007  DATE OF DISCHARGE:                              HISTORY & PHYSICAL   ADMISSION DIAGNOSIS:  Spondylosis at 64.   Neurosurgeon surgery fights x-rays 59, right-handed white gentleman who  had C3-4 at C7-T1 spondylosis.  Operation was done several months ago  and access 3471 was not accessible from the anterior   PROGNOSIS:  The patient's body habitus returns now for posterior  cervical and posterior C7-T1 fusion.  He has mostly left arm pain with  ulnar or C8 radiculopathy.   MEDICAL HISTORY:  Marked for adult onset diabetes, coronary artery  disease and COPD (he uses insulin), metformin, flunisolide, Combivent,  albuterol, ipratropium, loratadine, Toprol, lisinopril,  hydrochlorothiazide, ezetimibe, Crestor and aspirin, Motrin and  oxycodone.   ALLERGIES:  None.   SURGICAL HISTORY:  1. Appendectomy in 1950.  2. Five way coronary bypass in 1998.   SOCIAL HISTORY:  Does not smoke or drink and is retired Advertising account planner.   FAMILY HISTORY:  [name] is 77 and in good health.  [name] at age 79 deceased  with cancer.   REVIEW OF SYSTEMS:  Remarkable for wearing glasses, hearing aid, hearing  loss, hypertension, COPD, shortness of breath, back pain, arm pain, neck  pain, skin disease difficult memory diabetes and nasal allergies.   PHYSICAL EXAMINATION:  HEENT:  Within normal limits.  NECK:  He has a somewhat limited range of motion in his neck, and now it  does reproduces some of his left shoulder and arm pain.  CHEST:  Few wheezes.  CARDIAC:  Regular rate and rhythm with distant  heart sounds.  ABDOMEN:  Large but nontender with no hepatosplenomegaly.  EXTREMITIES:  No clubbing, cyanosis.  GU:  Exam is  deferred.  Peripheral pulses are good.  NEUROLOGICAL:  He is awake, alert and oriented, save for hard of hearing  Cranial nerves appear to function normally.  Motor exam shows 5/5  strength throughout the right upper extremity.  Left upper extremities  have good strength in deltoid, biceps, triceps, wrist extensors, finger  extensors about 4/5 and grips 4/5.  Reflexes are absent in the triceps,  one at the biceps on the left, one at the biceps and one at the triceps  on the right.  Brachioradialis is one.  There is very positive Tinel's  over the ulnar nerve and is greater on the left side.  Hoffman's is  negative MR results have been reviewed above.   CLINICAL IMPRESSION:  Residual left C8 radiculopathy.   PLAN:  A posterior laminectomy fusion.  The risks and benefits have been  discussed with him and he wishes to proceed.    .           ______________________________  Payton Doughty, M.D.     MWR/MEDQ  D:  05/19/2007  T:  05/19/2007  Job:  [id number]

## 2010-09-15 NOTE — Op Note (Signed)
[name], DIEHL             ACCOUNT NO.:  [id number]   MEDICAL RECORD NO.:  [id number]          PATIENT TYPE:  INP   LOCATION:  3315                         FACILITY:  [facility]   PHYSICIAN:  Payton Doughty, M.D.      DATE OF BIRTH:  10-13-1945   DATE OF PROCEDURE:  12/23/2006  DATE OF DISCHARGE:                               OPERATIVE REPORT   PREOPERATIVE DIAGNOSIS:  Spondylosis at C3-C4 disc C7-T1.   POSTOPERATIVE DIAGNOSIS:  Spondylosis at C3-C4 disc C7-T1.   OPERATIVE PROCEDURE:  C3-C4 anterior cervical discectomy and fusion with  Reflex hybrid plate and attempted C7-T1 anterior cervical decompression  and fusion.   SURGEON:  Payton Doughty, M.D.   ASSISTANT:  Danae Orleans. Venetia Maxon, M.D.   ANESTHESIA:  General endotracheal anesthesia.   PREPARATION:  Sterile Betadine prep and scrub with alcohol wipe.   COMPLICATIONS:  We were unable to access the C7-T1 disc space.   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  He is taken to the  operating room, smoothly anesthetized and intubated, placed spine on the  operating table. Following shave, prep, and drape in the usual sterile  fashion, two skin incisions were made, one was two fingerbreadths below  the mandible from the midline and the medial border of the  sternocleidomastoid muscle on the left side, the other one was one  fingerbreadth above the clavicle, also from the midline of the medial  border of the sternocleidomastoid muscle.  The platysma on both sides  was identified, elevated, divided, and undermined.  Dissection revealed  the sternocleidomastoid that was retracted laterally to the left, the  carotid artery and jugular vein retracted laterally to the left, trachea  and esophagus were retracted laterally to the right exposing bones of  the anterior cervical spine.  A marker was placed and the C3-C4 disc  space was identified.  Working in the  prevertebral space and counting  down from the marker, the C7-T1 disc space was also identified.  The C7-  T1 disc space was basically in the top part of the thoracic cavity.  The  aorta was directly over top of it and it was simply impossible to access  it via an anterior cervical approach.  Feeling that it was not warranted  to open the patient's chest, it was decided to forego the C7-T1  discectomy and to simply do the anterior decompression and fusion at C3-  C4.   At C3-C4, the disc was removed under gross observation and then the  operating microscope was brought in.  We used microdissection technique  to remove the remaining disc, divide the posterior annulus, divide the  posterior longitudinal ligament, and explore the disc space bilaterally.  The neural foramina were decompressed using the Kerrison punch and a  careful exploration revealed they were open.  An 8 mm bone graft was  fashioned and patellar allograft tapped into place and a 16 mm Reflex  hybrid plate was selected with 12 mm screws, two in C3, two in C4.  Intraoperative x-ray showed  good placement of bone, graft, plate, and  screws.  Both incisions were closed in successive layers of 3-0 Vicryl  and 4-0 Vicryl.  The patient was placed in an Aspen collar and returned  to the recovery room in good condition.           ______________________________  Payton Doughty, M.D.     MWR/MEDQ  D:  12/23/2006  T:  12/24/2006  Job:  [id number]

## 2010-09-15 NOTE — H&P (Signed)
NAMESHOLOM, DULUDE             ACCOUNT NO.:  [id number]   MEDICAL RECORD NO.:  [id number]          PATIENT TYPE:  INP   LOCATION:  3023                         FACILITY:  [facility]   PHYSICIAN:  Payton Doughty, M.D.      DATE OF BIRTH:  [birth year]   DATE OF ADMISSION:  12/24/2008  DATE OF DISCHARGE:                              HISTORY & PHYSICAL   ADMITTING DIAGNOSIS:  Torn meniscus in his right knee, diabetes, cardiac  ischemia, and lumbar radiculopathy.   BODY OF TEXT:  This is a now 65 year old right-handed white gentleman  who I have been following for several years.  He has had several  cervical fusions and done well, but has developed some pain in his left  leg, had a foraminal disk that was unresponsive to steroids and was to  be admitted today for foraminal diskectomy on the left side at L3-4;  however, yesterday, he rolled over him in bed and had a sharp popping  pain in his knee and since then been unable to bear weight on his right  leg because of pain in his knee.  It is tender immediately.  It is  tender with through all range of motion.  It seems as if he probably has  a torn meniscus and he cannot walk, so he is going to be admitted.   Secondly when exploring his history with him and found out that in  January he was up in [place] with a bout of atrial flutter and shortness of  breath.  There appeared to be some Q-waves on his initial EKG and this  was not mentioned, but because of this, we felt that it was not prudent  to operate him for his back, so during the time that he was admitted for  his knee, we were going to have the cardiologist visit with him as well.   His medical history is remarkable for adult-onset diabetes, coronary  artery disease, and COPD.   He is on insulin 70/30 45 twice a day, metformin, flunisolide,  Combivent, albuterol, ipratropium, loratadine, Toprol, lisinopril,  hydrochlorothiazide, ezetimibe, Crestor, aspirin, Motrin, and oxycodone.   He  has no allergies.   SURGICAL HISTORY:  He has an appendectomy in 1950s, bypass in 1998.  He  has anterior decompression and a posterior fusion.   SOCIAL HISTORY:  He does not smoke or drink and he is retired from  [employer].   FAMILY HISTORY:  Mom is 42, dad is 62, deceased with cancer.   REVIEW OF SYSTEMS:  Remarkable for wearing glasses, hearing aid, hearing  loss, hypertension, COPD, shortness of breath, back pain, arm pain, neck  pain, skin disease, difficult with memory, diabetes, nasal allergies,  and rheumatoid arthritis.   PHYSICAL EXAMINATION:  HEENT:  Normal limit.  NECK:  He has a little bit of reasonable range of motion of his neck.  CHEST:  Wheezes.  CARDIAC:  At this time is regular rate and rhythm with very distant  heart sounds.  ABDOMEN:  Large, but nontender with no hepatosplenomegaly.  EXTREMITIES:  Without clubbing or cyanosis.  He has  knee pain as  described above.  Strength is intact.  Sensory dysesthesias in the left  L3 distribution.  Reflexes were not check today because of his knee  pain.   CLINICAL IMPRESSION:  Herniated lumbar disks, which is secondary problem  now.  Needs to be visited by the Orthopedic doctors for his right knee  and he will visit the cardiologist about his heart.  He is going to be  admitted to 3000.           ______________________________  Payton Doughty, M.D.     MWR/MEDQ  D:  12/24/2008  T:  12/25/2008  Job:  [id number]

## 2010-09-15 NOTE — Cardiovascular Report (Signed)
NAMELAMONDRE, WESCHE             ACCOUNT NO.:  [id number]   MEDICAL RECORD NO.:  [id number]          PATIENT TYPE:  INP   LOCATION:  3701                         FACILITY:  [facility]   PHYSICIAN:  Arturo Morton. Riley Kill, MD, FACCDATE OF BIRTH:  1946-04-11   DATE OF PROCEDURE:  12/26/2008  DATE OF DISCHARGE:                            CARDIAC CATHETERIZATION   INDICATIONS:  The patient has an abnormal CV function study.  He  developed atrial flutter during evaluation for coronary ischemia prior  to operation.  He has had prior bypass surgery in 1998 by Dr. Cruz Condon.  Current study was done to assess coronary anatomy.   PROCEDURES:  1. Left heart catheterization.  2. Selective coronary angiography.  3. Selective left ventriculography.  4. Saphenous vein graft angiography.  5. Selective left internal mammary angiography.   DESCRIPTION OF PROCEDURE:  The procedure was performed from the right  femoral arteries using 5-French catheters.  He tolerated the procedure  without complication.  He was taken to the holding area in satisfactory  clinical condition.  I then called Dr. Eden Emms and discussed the case  with him and a surgical consult was obtained.  There were no major  complications.   HEMODYNAMIC DATA:  1. Central aortic pressure 99/52, mean 70.  2. Left ventricular pressure 106/20.  3. No gradient or pullback across the aortic valve.   ANGIOGRAPHIC DATA:  1. The left main coronary artery is free of critical disease.  2. The LAD goes out anteriorly, is calcified, and then demonstrates a      moderate stenosis in the calcified area that is difficult to assess      in severity, but is at least 70%.  This is because there is      competitive flow to and fro.  3. The distal left anterior descending artery is supplied by an      internal mammary vessel and is widely patent.  4. There is a saphenous vein graft to the diagonal branch that is      small in caliber and has about 60%  narrowing proximally.  5. The circumflex is a fairly large-caliber vessel.  The first      marginal branch is probably not seen.  The second marginal branch      has a proximal lesion of 70-80% proximally and then has competitive      flow filling distally.  The AV circumflex has multiple lesions      coursing posteriorly, all of which appear to be moderate in      severity but noncritical being about 70% luminal reduction.  6. The saphenous vein graft sequentially to two marginal branches      demonstrates wide patency to the first marginal branch but tandem      lesions of 80-90% in the second limb.  7. The right coronary artery is severely diseased but patent.  There      is long 80% narrowing in the proximal mid junction followed by a      70% fairly focal stenosis in the midvessel.  8. The saphenous vein graft to the  distal right coronary artery is      patent but with a subtotal 95% lesion distally.   Ventriculography done in the RAO projection reveals preserved global  systolic function with well-preserved wall motion.   CONCLUSIONS:  1. Normal left ventricular function.  2. 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  The patient is diabetic with elevated      sugars.  With multiple lesions, it is not ideal for percutaneous      intervention, yet redo surgery involves a fairly drastic step with      a widely patent large internal mammary.  This will need to be      discussed in greater detail.  Surgical consultation will be      obtained.  I have discussed the case with Dr. Eden Emms who will      follow up.      Arturo Morton. Riley Kill, MD, [facility]  Electronically Signed     TDS/MEDQ  D:  12/26/2008  T:  12/27/2008  Job:  [id number]   cc:   Noralyn Pick. Eden Emms, MD, [facility]  CV Laboratory  [facility] Medicine Inpatient Teaching Service

## 2010-09-15 NOTE — Consult Note (Signed)
[name], [name]             ACCOUNT NO.:  [id number]   MEDICAL RECORD NO.:  [id number]          PATIENT TYPE:  INP   LOCATION:  3023                         FACILITY:  [facility]   PHYSICIAN:  Noralyn Pick. Eden Emms, MD, FACCDATE OF BIRTH:  Mar 13, 1946   DATE OF CONSULTATION:  DATE OF DISCHARGE:                                 CONSULTATION   HISTORY OF PRESENT ILLNESS:  A 65 year old patient we were asked to  clear for surgery.  Interestingly, Mr. Osgood was coming to the  hospital for lumbar surgery, however, the evening before he injured his  right knee.  He is unable to weightbear and will likely get an MRI and  require a right knee surgery.   His primary care doctor is Dr. Donzetta Sprung.  He is status post coronary  artery bypass surgery in 1998.  I believe at that time his cardiologist  was [facility].  He has not been seen on a regular basis by  Cardiology.  We do not have records from [facility] or Dr.  Reuel Boom.  He indicates he has not had a stress test in over 5 years.  He  does not get chest pain but does get exertional dyspnea.  He is a  previous smoker with hypertension and diabetes.   The patient's activities are limited by rheumatoid arthritis and his  knee pain.   He also has limitations on exercise from his chronic back pain.   He has not had any significant palpitations or syncope.  There has been  no diaphoresis.  No history of valvular heart disease or congestive  heart failure.   The patient does indicate that he was hospitalized up at [facility] for  shortness of breath in January or February.  He has not seen by  Cardiology at that time.  There was a question if this was pneumonia or  URI.  He indicates that his heart was fluttering at that time.  I have  asked our PA to get these records to review.   REVIEW OF SYSTEMS:  His 10-point review of systems is otherwise  negative.   PAST MEDICAL HISTORY:  Remarkable for:  1. Previous spondylolysis with  C-spine surgery by Dr. Channing Mutters.  2. History of diabetes.  3. History of hypertension.  Lipid status unknown.  4. History of rheumatoid arthritis.   ALLERGIES:  He denies any allergies.   CURRENT MEDICATIONS:  Insulin sliding scale, magnesium and oxycodone.   FAMILY HISTORY:  Noncontributory.  The patient is happily married.  He  and his wife are from [place].  He is retired.   SOCIAL HISTORY:  He does not drink and quit smoking approximately 8-10  years ago.   PHYSICAL EXAMINATION:  GENERAL:  Elderly male in no distress.  HEENT:  Unremarkable.  NECK:  Carotids are without bruit.  No lymphadenopathy,  thyromegaly or  JVP elevation.  LUNGS:  Clear.  No diaphragmatic motion.  No wheezing  CVA:  S1 and S2.  Normal heart sounds.  PMI normal.  ABDOMEN:  Benign.  Bowel sounds positive.  No AAA.  No tenderness.  No  bruit.  No hepatosplenomegaly, no hepatojugular reflux or tenderness.  Distal pulses are intact.  No edema.  NEURO:  Nonfocal.  SKIN:  Warm and dry.  He has marked erythema and swelling with an  effusion in his right knee.  It is extremely tender to the touch.  I did  not try to extend or flex the right knee.   STUDIES:  His baseline EKG shows sinus rhythm with an old inferior wall  MI.  There were no old EKGs to compare.   LABORATORY WORK:  Remarkable for potassium of 4.2, creatinine 1.06.  Protime 0.9, white count 11.6, hematocrit 46.5.   His current blood pressure is 140/80, pulse is 62, respiratory rate 16,  afebrile.   IMPRESSION:  1. Preoperative clearance.  The patient has 66 year old bypass grafts.      He is a diabetic and has limited activity due to his arthritis,      neck, lower back and knee problems.  He needs a pharmacological      stress test prior to clearing him for surgery.  We will order an      adenosine Myoview study on him tomorrow.  I suspect it will show an      old inferior wall infarct, but so long as there is no reversibility      or marked  ischemia, he will be cleared for surgery and we need to      get his home medications.  I suspect he should be on a beta blocker      and ACE inhibitor.  2. Diabetes.  Would get Internal Medicine consultation.  Currently, he      is only being ordered for sliding scale.  Again, home medications      should be instituted if he is to get any dye, any Glucophage would      be held.  3. Right knee pain.  As far as I can tell, Orthopedics has not seen      the patient.  Suspect he will need an MRI to diagnose any tears.      He may need to have the effusion tapped.  Again, he will not be      ready for any surgery until December 26, 2008 as we will need      tomorrow to do his adenosine Myoview and clear his heart.   We will try to get records from [facility] since there was a  question of his heart fluttering in January, February.  He may have had  paroxysmal fibrillation and flutter in the setting of a URI pneumonia.   Further recommendation would be based on results of his stress test.      Theron Arista C. Eden Emms, MD, [facility]  Electronically Signed     PCN/MEDQ  D:  12/24/2008  T:  12/25/2008  Job:  [id number]

## 2010-09-15 NOTE — H&P (Signed)
[name], [name]             ACCOUNT NO.:  [id number]   MEDICAL RECORD NO.:  [id number]          PATIENT TYPE:  INP   LOCATION:  3172                         FACILITY:  [facility]   PHYSICIAN:  Payton Doughty, M.D.      DATE OF BIRTH:  1946/02/19   DATE OF ADMISSION:  12/23/2006  DATE OF DISCHARGE:                              HISTORY & PHYSICAL   ADMITTING DIAGNOSES:  Spondylosis at C3-4 and at C7-T1.   SERVICE:  Neurosurgery.   This is a very nice 65 year old right-handed white gentleman who since  November is having increasing pain, some in the neck, mostly up the left  shoulder, down his left arm with numbness on the ulnar side of the left  hand.  Had initial studies, showed spondylosis at 3-4.  The neurosurgeon  did not get much done.  I saw him in my office in June, got an EMG and  nerve conduction studies as well as a repeat MRI.  EMG and nerve  conduction studies showed left C8 radiculopathy and a repeat MRI showed  significant compression at left L4 root at the neural foramen as well as  the left C8 nerve root and C7-T1 neural foramen, and he is admitted for  decompression and fusion at both those levels.   MEDICAL HISTORY:  Remarkable for adult-onset diabetes, coronary artery  disease, and COPD.  He uses insulin 70/30 with 34 in the morning, 24 at  night.  He uses metformin 2000 mg a day.  He is on flunisolide,  Combivent, albuterol, ipratropium, loratadine, Toprol, lisinopril,  hydrochlorothiazide, ezetimibe, and Crestor.   ALLERGIES:  HE HAS NO ALLERGIES.   SURGICAL HISTORY:  He has had an appendectomy, __________ and a bypass  in 1998.   SOCIAL HISTORY:  He does not smoke or drink, and he is a retired  Advertising account planner.   FAMILY HISTORY:  Mom is 34 in good health.  Dad is 33, deceased with  lung cancer.   REVIEW OF SYSTEMS:  Remarkable for wearing glasses, hearing aid, hearing  loss, hypertension, COPD, shortness of breath, back pain, arm pain, neck  pain, skin  disease, difficulty without memory, diabetes, and nasal  allergies.   PHYSICAL EXAMINATION:  HEENT:  Within normal limits.  NECK:  He has somewhat limited range of motion of his neck.  CHEST:  Few wheezes.  CARDIAC EXAM:  Regular rate and rhythm.  Distant heart sounds.  ABDOMEN:  Nontender, no hepatosplenomegaly.  EXTREMITIES:  Without clubbing or cyanosis.  GU EXAM:  Deferred.  Peripheral pulses are good.  NEUROLOGICALLY:  He is awake, alert, and oriented except for he is hard  of hearing.  His cranial nerves are intact.  Motor exam:  Shows 5/5  strength throughout the right upper extremity and left upper extremity  has good strength in the deltoid, biceps, triceps extensors, and finger  extensors are 4+/5 and grips 4/5.  Reflexes are absent at the biceps, 1  at the triceps on the left, 1 at the triceps on the right.  Brachial  radialis is 1 and hehas very positive Tinel over the ulnar  nerve roots.  On the left side, Hoffmann's is negative.   His MRI and EMG results have been reviewed above.   CLINICAL IMPRESSION:  Cervical spondylosis at C7-T1 and at T3-4.   PLAN:  Anterior decompression and fusion at C3-4 as well as at C7-T1.  The risks and benefits have been discussed with him, and he wished to  proceed.           ______________________________  Payton Doughty, M.D.     MWR/MEDQ  D:  12/23/2006  T:  12/23/2006  Job:  [id number]

## 2010-09-15 NOTE — Consult Note (Signed)
[name], [name]             ACCOUNT NO.:  [id number]   MEDICAL RECORD NO.:  [id number]          PATIENT TYPE:  INP   LOCATION:  3701                         FACILITY:  [facility]   PHYSICIAN:  Salvatore Decent. Cornelius Moras, M.D. DATE OF BIRTH:  [birth year]   DATE OF CONSULTATION:  12/27/2008  DATE OF DISCHARGE:                                 CONSULTATION   REQUESTING PHYSICIAN:  Arturo Morton. Riley Kill, MD, [facility]   REASON FOR CONSULTATION:  Severe three-vessel coronary artery disease  with vein graft disease status post coronary artery bypass grafting in  1998.   HISTORY OF PRESENT ILLNESS:  [name] is a 65 year old obese white  male with known history of coronary artery disease, hypertension, type 2  diabetes mellitus, rheumatoid arthritis, chronic obstructive pulmonary  disease, and hyperlipidemia.  The patient presented with angina pectoris  in 1998 and underwent coronary artery bypass grafting x5 at that time.  Grafts placed at the time of surgery included left internal mammary  artery to the distal left anterior descending coronary artery, saphenous  vein graft to a diagonal branch, saphenous vein graft to first and  second obtuse marginal branches sequentially, and saphenous vein graft  to the posterior descending coronary artery.  The patient did well,  although he states that it took quite a while for him to get over the  surgery, particularly with respect to recovering from the sternotomy  itself.  He did not have any wound complications per se, but he remained  sore for more than six months.  He has done well from a cardiac  standpoint until presently.  In January of this year, he apparently was  hospitalized briefly at [facility] in [place] with atrial flutter.  This resolved spontaneously and cardiac enzymes were reportedly  negative.  He did not have any further cardiac workup done at that time  and he was not treated with anticoagulation.  Over the last 6 months,  [name]  has had considerable problems with his back.  He apparently  developed acute back pain related to herniated disk at L3-L4.  This has  been associated with numbness and weakness involving his left leg.  He  states that the pain has gotten some better in recent weeks, and he is  now able to ambulate with a cane.  However, he has been evaluated by Dr.  Trey Sailors with tentative plans to proceed with possible surgical  intervention.  For preoperative clearance, however, the patient was  noted to have an abnormal electrocardiogram and given history of  previous coronary artery disease and episode of atrial flutter last  January, cardiac consultation was requested for preoperative clearance.  At this time, the patient also developed acute swelling and pain in his  right knee consistent with acute inflammatory arthritis, for which he  was seen by the orthopedic surgical team.  He underwent needle  aspiration and steroid injection.  The patient is now being treated with  increased doses of oral prednisone.  The patient was taken for a stress  echocardiogram for preoperative cardiac clearance.  He went into atrial  flutter during  the procedure, which was subsequently terminated.  The  patient then underwent elective cardiac catheterization by Dr. Riley Kill  yesterday.  He was found to have normal left ventricular size and  function.  There is progression of native three-vessel coronary artery  disease.  All five of the previous bypass grafts remained patent and  functional, although there is high-grade stenosis in the midportion of  the vein graft to the posterior descending coronary artery as well as  partial stenosis in the continuation of vein graft to the second obtuse  marginal branch.  There is mild <70% stenosis of the vein graft to the  diagonal branch.  All remaining grafts are functioning normally and free  of significant stenosis.  Cardiothoracic surgical consultation was  requested to  consider possible treatment options.   REVIEW OF SYSTEMS:  GENERAL:  The patient reports that for the last 6  months, he has been down and very sedentary because of problems related  to his back.  As such, he has been fairly inactive physically.  His  appetite has been fine and he has actually gained back about 10 pounds  in weight.  CARDIAC:  The patient denies any history of chest pain, chest tightness,  chest pressure either with activity or at rest.  When he developed  atrial flutter during his stress echo, he was asymptomatic.  He reports  mild exertional shortness of breath.  The patient, otherwise, denies  shortness of breath either with activity or at rest.  The patient denies  PND, orthopnea, or lower extremity edema.  RESPIRATORY:  Notable for intermittent bouts of congestion which the  patient treats with nebulized breathing treatment at home.  He states  that this loosens up his breathing when he feels like he is getting  tight and allows him to cough and clear the secretions.  He has managed  this way for years.  He, otherwise, denies productive cough, hemoptysis,  wheezing.  GASTROINTESTINAL:  Notable for some intermittent problems with  swallowing pills and initiation of swallowing that dates back to his  cervical disk surgery many years ago.  The patient reports normal bowel  function.  He denies hematochezia, hematemesis, melena.  MUSCULOSKELETAL:  Notable for chronic problems related to rheumatoid  arthritis.  The patient recently was instructed to stop taking ibuprofen  by his primary care physician, and subsequent to this developed acute  flare of pain in his back and severe acute flare in his right knee.  This is being treated with high-dose steroids at this time, and the  symptoms are improving.  The right knee pain has improved markedly  following knee aspiration and cortisone injection.  The patient still  has chronic back pain as well as pain and numbness  involving the medial  aspect of the left upper thigh extending down to the level of the knee.  NEUROLOGIC:  Notable for symptoms of sciatic pain and numbness in the L3-  L4 distribution as noted on the left side.  GENITOURINARY:  Negative.  HEENT:  Negative.  ENDOCRINE:  Notable for type 2 diabetes mellitus.  The patient admits  that he does not check his blood sugars at home.  He states that his  most recent hemoglobin A1c was greater than 9.  INFECTIOUS:  Negative.   PAST MEDICAL HISTORY:  1. Coronary artery disease status post coronary artery bypass grafting      x5 in 1998.  2. Hypertension.  3. Hyperlipidemia.  4. Type 2 diabetes mellitus,  poorly controlled.  5. Rheumatoid arthritis.  6. Chronic obstructive pulmonary disease.  7. Herniated disk L3-L4 with left side sciatic pain and numbness.  8. Obesity.  9. Atrial flutter.   PAST SURGICAL HISTORY:  1. Appendectomy.  2. Coronary artery bypass grafting x5.  3. Anterior cervical decompression with posterior decompression and      fusion.   FAMILY HISTORY:  Noncontributory.   SOCIAL HISTORY:  The patient is married, lives with his wife in [place].  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  The  patient has remote history of heavy tobacco abuse, although he quit  smoking in 1998.  He denies excessive alcohol consumption.   MEDICATIONS PRIOR TO ADMISSION:  1. Lisinopril 40 mg daily.  2. Novolin 70/30 insulin 45 units twice daily.  3. Rosuvastatin 5 mg daily.  4. Oxycodone as needed for pain.  5. Metformin 1000 mg twice daily.  6. Advair Diskus 1 puff twice daily.  7. Albuterol inhaler as needed.  8. Zetia 10 mg daily.  9. Metoprolol 50 mg twice daily.  10.Prednisone 5 mg daily.  11.Hydrochlorothiazide 25 mg daily.  12.Flexeril 10 mg three times daily as needed.  13.Coenzyme Q10.  14.Fish oil capsule.  15.Aspirin 81 mg  daily.  16.Humira twice monthly.  17.Ibuprofen as needed.   DRUG ALLERGIES:  None known.   PHYSICAL EXAMINATION:  GENERAL:  The patient is a well-appearing obese  male who appears his stated age, in no acute distress.  HEENT:  Unrevealing.  NECK:  Supple.  There is no cervical or supraclavicular lymphadenopathy.  There is no jugular venous distention.  No carotid bruits noted.  CHEST:  A well-healed median sternotomy scar.  Auscultation demonstrates  distant breath sounds.  No wheezes, rales, or rhonchi noted.  CARDIOVASCULAR:  Notable for distant heart sounds.  No murmurs, rubs, or  gallops are appreciated.  ABDOMEN:  Obese, but soft and nontender.  There are no palpable masses.  Bowel sounds are present.  EXTREMITIES:  Warm and well perfused.  There is no lower extremity  edema.  Distal pulses are easily palpable in the posterior tibial  position.  There are well-healed surgical scars up and down the right  thigh and right lower leg from previous saphenous vein harvest.  There  is no sign of venous insufficiency.  RECTAL AND GU:  Both deferred.  SKIN:  Clean, dry, healthy appearing throughout.  NEUROLOGIC:  Grossly nonfocal.   DIAGNOSTIC TESTS:  Cardiac catheterization performed by Dr. Riley Kill is  reviewed.  This demonstrates severe native three-vessel coronary artery  disease with 80-90% proximal stenosis of the right coronary artery, 70%  stenosis of the distal right coronary artery.  There is patent vein  graft to the posterior descending coronary artery which is widely patent  and functional with exception of a short segment 95% irregular stenosis  in the mid portion of the vein graft.  The left main coronary artery is  normal.  There is 70% proximal stenosis of the left anterior descending  coronary artery.  There is patent left internal mammary artery graft to  the distal left anterior descending coronary artery, which is widely  patent and functioning normally.  There is  a small diagonal branch with  a patent vein graft to the diagonal branch that is functioning normally.  There is tubular 50-60% proximal stenosis of the vein graft to the  diagonal branch.  There is 100% proximal occlusion of the large obtuse  marginal branch.  There is 80% proximal stenosis of the second obtuse  marginal branch and 70% stenosis of the distal left circumflex coronary  artery.  There is patent vein graft sequence to the first and second  obtuse marginal branches.  This vein graft is functioning normally to  the first obtuse marginal branch.  There are tandem ulcerated 80-90%  lesions in the continuation of the vein graft between the first and  second obtuse marginal branches.  Left ventricular function is normal  with no significant wall motion abnormalities.   IMPRESSION:  Severe native three-vessel coronary artery disease with  vein graft disease, status post coronary artery bypass grafting x5 in  1998.  The patient is asymptomatic, but developed atrial flutter during  stress and would clearly be at risk for acute myocardial infarction in  the event he were to undergo general anesthesia for surgical  intervention for other purposes.  He has chronic back pain and sciatica  related to herniated lumbar disk.  His physical mobility has been  severely hampered over the last 6 months.  His diabetes is poorly  controlled.  Options for management of coronary artery disease include  continued medical therapy versus percutaneous coronary intervention,  versus repeat surgical revascularization.  Surgical revascularization  would probably come with the best long-term prognosis in terms of  decreased risk of recurrent cardiac event.  However, this would come  with considerable operative risk, and even in the absence of  complications, the postoperative recovery would certainly be slow.  I  suspect it would be at least 6 months if not longer following redo  coronary artery bypass  grafting before [name] could consider  surgical treatment for his herniated disk.  Percutaneous coronary  intervention for treatment of vein graft disease appears to be feasible,  particularly with respect to the high-grade lesion in the vein graft to  the posterior descending coronary artery.  If this were treated  percutaneously, I suspect that [name] could undergo his back  surgery with acceptable risk.  The pertinent question there would be  whether or not to use a drug-eluting stent at the time of percutaneous  coronary intervention.   PLAN:  I have discussed matters at length with [name] and his wife.  [name] seems reluctant to consider redo coronary artery bypass  surgery at this time.  Under the circumstances, I suspect that his  impression is correct.  I would favor a percutaneous coronary  intervention at this time in an effort to get him back on his feet as  quick as possible and allow him to have his back surgery if he in fact  needs to have it done to get him back physically mobile.  Ultimately, he  may require redo surgical revascularization for treatments of coronary  artery disease, but this would come with considerable risk.  All of his  questions have been addressed.  We will discuss matters further with Dr.  Riley Kill.  Finally, his diabetes needs to be brought under very tight  control if his long-term prognosis is going to factor into the equation  at all.  Without aggressive attention to his underlying medical disease,  [name] should not be considered a candidate for redo coronary  artery bypass grafting because his long term prognosis would remain poor  even with successful coronary revascularization.      Salvatore Decent. Cornelius Moras, M.D.  Electronically Signed     CHO/MEDQ  D:  12/27/2008  T:  12/28/2008  Job:  [id number]   cc:   Arturo Morton. Riley Kill, MD, [facility]  Kathlene Cote, M.D.

## 2010-09-15 NOTE — Discharge Summary (Signed)
NAMECHANEL, MCKESSON             ACCOUNT NO.:  [id number]   MEDICAL RECORD NO.:  [id number]          PATIENT TYPE:  INP   LOCATION:  3010                         FACILITY:  [facility]   PHYSICIAN:  Payton Doughty, M.D.      DATE OF BIRTH:  1945/06/06   DATE OF ADMISSION:  05/19/2007  DATE OF DISCHARGE:  05/22/2007                               DISCHARGE SUMMARY   ADMISSION DIAGNOSES:  Cervical spondylosis at C7, T1 with right CA  radiculopathy.   DISCHARGE DIAGNOSES:  Cervical spondylosis at C7, T1 with right CA  radiculopathy.   PROCEDURE:  C7, T1 posterior cervical fusion with C7, T1 laminotomy.   COMPLICATIONS:  None.   DISPOSITION:  Discharged in satisfactory condition.   HISTORY OF PRESENT ILLNESS:  This is a 65 year old gentleman. His  history and physical is recounted on the chart. He had C3-4 operated on  previously. C7,T1 could not be operated because of his body habitus. He  was therefore admitted for posterior decompression and fusion.   PAST MEDICAL HISTORY:  Remarkable for coronary artery disease. He is  cleared by electro-cardiologist.   PHYSICAL EXAMINATION:  GENERAL:  Intact examination.  NEUROLOGIC:  Intact examination.   HOSPITAL COURSE:  He was admitted after ascertainment of the normal  laboratory values and underwent a C7, T1 posterior cervical fusion as  well as laminotomy as described above. Postoperatively, he has done  extremely well in the Intensive Care Unit for 1 night because of his  heart. Spent another day on the floor. Now, he is up and about with  interscapular pain. Strength is full. His left hand is much better. His  incisions are well healing.   DISCHARGE MEDICATIONS:  Percocet for pain and Ciprofloxacin  prophylactically.   FOLLOWUP:  With me in the [facility] office in about a week for suture  removal.           ______________________________  Payton Doughty, M.D.     MWR/MEDQ  D:  05/21/2007  T:  05/21/2007  Job:  [id number]

## 2010-09-15 NOTE — Op Note (Signed)
NAMEKHRISTIAN, PHILLIPPI             ACCOUNT NO.:  [id number]   MEDICAL RECORD NO.:  [id number]          PATIENT TYPE:  INP   LOCATION:  3112                         FACILITY:  [facility]   PHYSICIAN:  Payton Doughty, M.D.      DATE OF BIRTH:  11/06/1945   DATE OF PROCEDURE:  05/19/2007  DATE OF DISCHARGE:                               OPERATIVE REPORT   PREOPERATIVE DIAGNOSIS:  Spondylosis and left C8 radiculopathy.   POSTOPERATIVE DIAGNOSIS:  Spondylosis and left C8 radiculopathy.   OPERATIVE PROCEDURE:  C7-T1 laminotomy and foraminotomy and C7-T1  posterior cervical fusion.   ANESTHESIA:  General endotracheal anesthesia.   PREPARATION:  Betadine prep with alcohol wipe.   COMPLICATIONS:  None.   ASSISTANT:  Coletta Memos, M.D.  Nurse assistant Basilia Jumbo.   BODY OF TEXT:  This is a 65 year old gentleman with cervical spondylosis  and left C8 radiculopathy taken to the operating room, smoothly  anesthetized and intubated, placed on the [facility] bed and turned prone  with pressure points padded. Following shave, prep, and drape in the  usual sterile fashion, the skin was infiltrated with 1% lidocaine with  1:400,000 epinephrine.  The skin was incised from T1 to C5. The lamina  of T1, C7, C6 and C5 on the left side and C7 and T1 on the right side  were exposed.  X-ray was obtained.  However, it was not feasible to see  the marker. Correlation with the MRI found that C5 was the last bifid  spinous process, C6 was minimally bifid and somewhat shorter, and C7 was  tall and was the vertebral prominence. Therefore, the vertebral  prominence and the level below were fused.  C7-T1 laminotomy and  foraminotomy was carried out on the left side with exposure of the C8  root and resulted in its decompression. Lateral mass screws were placed  in C7 bilaterally and pedicle screws placed in T1 bilaterally.  They  were connected with rods.  The lamina was decorticated with a high-speed  drill and packed  with BMP on the extender matrix.  The wound was  irrigated.  Hemostasis was assured.  Successive layers of 0 Vicryl, 2-0  Vicryl, and 3-0 nylon were used to close.  A Betadine dressing was  applied.  The patient was placed in an Aspen collar and returned to the  recovery room in good condition.           ______________________________  Payton Doughty, M.D.     MWR/MEDQ  D:  05/19/2007  T:  05/19/2007  Job:  [id number]

## 2010-09-15 NOTE — Discharge Summary (Signed)
[name], [name]             ACCOUNT NO.:  [id number]   MEDICAL RECORD NO.:  [id number]          PATIENT TYPE:  INP   LOCATION:  3315                         FACILITY:  [facility]   PHYSICIAN:  Danae Orleans. Venetia Maxon, M.D.  DATE OF BIRTH:  12-02-1945   DATE OF ADMISSION:  12/23/2006  DATE OF DISCHARGE:  12/25/2006                               DISCHARGE SUMMARY   REASON FOR ADMISSION:  Cervical spondylosis of C3-4 and C7-T1.   FINAL DIAGNOSIS:  Cervical spondylosis of C3-4 and C7-T1   ADDITIONAL DIAGNOSES:  1. Chronic obstructive pulmonary disease.  2. Hypertension.  3. Diabetes.   HISTORY OF PRESENT ILLNESS AND HOSPITAL COURSE:  [name] is  a 65 year old man with bilateral upper extremity hand weakness, Hoffmann  sign on the left and evidence of cervical myelopathy.  He was also taken  to surgery  initially for plan to do a decompression and fusion at C3-4  and C7-T1, however, it was not possible to access C7-T1 level because of  his size and high-riding aorta but he underwent major decompression and  fusion at C3-4.  However, the patient had significant discomfort  swallowing after surgery and was initially given pain medication by  elixir and a short course of Decadron.  On the 24th he was doing well  and was swallowing well.  He was discharged home in stable and  satisfactory condition.  He tolerated operation and hospitalization well  with discharge instructions to wear a cervical collar.  Follow up with  Dr. Channing Mutters in the office in about 2 weeks.   DISCHARGE MEDICATIONS:  He was given discharge medications of:  1. Percocet.  2. Valium.  Along with his preoperative medications of:  1. Lisinopril 40 mg daily.  2. Hydrochlorothiazide 25 mg daily.  3. Crestor 5 mg daily.  4. Oxycodone 5/325 as needed.  5. Loratadine 20 mg daily.  6. __________  aspirin 81 daily.  7. Zetia 10 daily.  8. Metoprolol 50 mg 2 tablets daily.  9. Metformin 1000 mg 2 times daily.  10.70/30  insulin 35 units every morning and 25 unit every evening.  11.Albuterol nebulizer and ipratropium nebulizers.      Danae Orleans. Venetia Maxon, M.D.  Electronically Signed     JDS/MEDQ  D:  12/25/2006  T:  12/25/2006  Job:  [id number]

## 2010-09-17 ENCOUNTER — Ambulatory Visit: Payer: Self-pay | Admitting: Cardiology

## 2010-12-02 ENCOUNTER — Encounter: Payer: Self-pay | Admitting: Cardiology

## 2010-12-10 ENCOUNTER — Encounter: Payer: Self-pay | Admitting: Cardiology

## 2010-12-10 ENCOUNTER — Ambulatory Visit (INDEPENDENT_AMBULATORY_CARE_PROVIDER_SITE_OTHER): Payer: Medicare Other | Admitting: Cardiology

## 2010-12-10 VITALS — BP 141/70 | HR 73 | Resp 23 | Ht 71.0 in | Wt 284.1 lb

## 2010-12-10 DIAGNOSIS — Z8639 Personal history of other endocrine, nutritional and metabolic disease: Secondary | ICD-10-CM

## 2010-12-10 DIAGNOSIS — I4892 Unspecified atrial flutter: Secondary | ICD-10-CM

## 2010-12-10 DIAGNOSIS — Z862 Personal history of diseases of the blood and blood-forming organs and certain disorders involving the immune mechanism: Secondary | ICD-10-CM

## 2010-12-10 DIAGNOSIS — I25810 Atherosclerosis of coronary artery bypass graft(s) without angina pectoris: Secondary | ICD-10-CM

## 2010-12-10 NOTE — Progress Notes (Deleted)
HPI   No Known Allergies  Current Outpatient Prescriptions on File Prior to Visit  Medication Sig Dispense Refill  . albuterol-ipratropium (COMBIVENT) 18-103 MCG/ACT inhaler Inhale 2 puffs into the lungs every 6 (six) hours as needed.        . ascorbic acid (VITAMIN C) 500 MG tablet Take 500 mg by mouth 2 (two) times daily.        . aspirin 81 MG EC tablet Take 81 mg by mouth daily.        . clopidogrel (PLAVIX) 75 MG tablet Take 75 mg by mouth daily.        . co-enzyme Q-10 30 MG capsule Take 30 mg by mouth daily.        . ezetimibe (ZETIA) 10 MG tablet Take 10 mg by mouth daily.        . inFLIXimab (REMICADE) 100 MG injection Inject into the vein every 30 (thirty) days.        . insulin NPH-insulin regular (NOVOLIN 70/30) (70-30) 100 UNIT/ML injection Inject 63 Units into the skin 2 (two) times daily.        . ipratropium-albuterol (DUONEB) 0.5-2.5 (3) MG/3ML SOLN Take 3 mLs by nebulization 4 (four) times daily as needed.        . lisinopril (PRINIVIL,ZESTRIL) 40 MG tablet Take 40 mg by mouth daily.        . metFORMIN (GLUCOPHAGE) 1000 MG tablet Take 1,000 mg by mouth 2 (two) times daily.        . metoprolol (TOPROL-XL) 50 MG 24 hr tablet Take 50 mg by mouth 2 (two) times daily.        . Multiple Vitamin (MULTIVITAMIN) tablet Take 1 tablet by mouth daily.        . oxyCODONE-acetaminophen (PERCOCET) 5-325 MG per tablet Take 1 tablet by mouth every 4 (four) hours as needed.        . predniSONE (DELTASONE) 5 MG tablet Take 5 mg by mouth 2 (two) times daily.        . rosuvastatin (CRESTOR) 5 MG tablet Take 5 mg by mouth every other day.        . Omega-3 Fatty Acids (FISH OIL) 1000 MG CAPS Take 1 capsule by mouth daily. 1000 mg?       . vitamin E 400 UNIT capsule Take 400 Units by mouth daily.          Past Medical History  Diagnosis Date  . Rheumatoid arthritis     infliximab infusions every month   . COPD (chronic obstructive pulmonary disease)   . Elevated liver function tests      Past Surgical History  Procedure Date  . Coronary stent placement     s/p bare metal stent to the vein graft to the RCA 9/20. nml LV function   . Appendectomy   . Coronary artery bypass graft 1998  . Anterior cervical decompression     with posterior decompression and fusion     No family history on file.  History   Social History  . Marital Status: Married    Spouse Name: N/A    Number of Children: N/A  . Years of Education: N/A   Occupational History  . Not on file.   Social History Main Topics  . Smoking status: Former Smoker  . Smokeless tobacco: Not on file  . Alcohol Use: No  . Drug Use: Not on file  . Sexually Active: Not on file   Other Topics Concern  .   Not on file   Social History Narrative   Married, retired.     PHYSICAL EXAM BP 141/70  Pulse 73  Resp 23  Ht 5' 11" (1.803 m)  Wt 284 lb 1.9 oz (128.876 kg)  BMI 39.63 kg/m2  SpO2 93%   ECG:  ASSESSMENT AND PLAN    

## 2010-12-10 NOTE — Progress Notes (Deleted)
HPI   No Known Allergies  Current Outpatient Prescriptions on File Prior to Visit  Medication Sig Dispense Refill  . albuterol-ipratropium (COMBIVENT) 18-103 MCG/ACT inhaler Inhale 2 puffs into the lungs every 6 (six) hours as needed.        Marland Kitchen ascorbic acid (VITAMIN C) 500 MG tablet Take 500 mg by mouth 2 (two) times daily.        Marland Kitchen aspirin 81 MG EC tablet Take 81 mg by mouth daily.        . clopidogrel (PLAVIX) 75 MG tablet Take 75 mg by mouth daily.        Marland Kitchen co-enzyme Q-10 30 MG capsule Take 30 mg by mouth daily.        Marland Kitchen ezetimibe (ZETIA) 10 MG tablet Take 10 mg by mouth daily.        Marland Kitchen inFLIXimab (REMICADE) 100 MG injection Inject into the vein every 30 (thirty) days.        . insulin NPH-insulin regular (NOVOLIN 70/30) (70-30) 100 UNIT/ML injection Inject 63 Units into the skin 2 (two) times daily.        Marland Kitchen ipratropium-albuterol (DUONEB) 0.5-2.5 (3) MG/3ML SOLN Take 3 mLs by nebulization 4 (four) times daily as needed.        Marland Kitchen lisinopril (PRINIVIL,ZESTRIL) 40 MG tablet Take 40 mg by mouth daily.        . metFORMIN (GLUCOPHAGE) 1000 MG tablet Take 1,000 mg by mouth 2 (two) times daily.        . metoprolol (TOPROL-XL) 50 MG 24 hr tablet Take 50 mg by mouth 2 (two) times daily.        . Multiple Vitamin (MULTIVITAMIN) tablet Take 1 tablet by mouth daily.        Marland Kitchen oxyCODONE-acetaminophen (PERCOCET) 5-325 MG per tablet Take 1 tablet by mouth every 4 (four) hours as needed.        . predniSONE (DELTASONE) 5 MG tablet Take 5 mg by mouth 2 (two) times daily.        . rosuvastatin (CRESTOR) 5 MG tablet Take 5 mg by mouth every other day.        . Omega-3 Fatty Acids (FISH OIL) 1000 MG CAPS Take 1 capsule by mouth daily. 1000 mg?       . vitamin E 400 UNIT capsule Take 400 Units by mouth daily.          Past Medical History  Diagnosis Date  . Rheumatoid arthritis     infliximab infusions every month   . COPD (chronic obstructive pulmonary disease)   . Elevated liver function tests      Past Surgical History  Procedure Date  . Coronary stent placement     s/p bare metal stent to the vein graft to the RCA 9/20. nml LV function   . Appendectomy   . Coronary artery bypass graft 1998  . Anterior cervical decompression     with posterior decompression and fusion     No family history on file.  History   Social History  . Marital Status: Married    Spouse Name: N/A    Number of Children: N/A  . Years of Education: N/A   Occupational History  . Not on file.   Social History Main Topics  . Smoking status: Former Games developer  . Smokeless tobacco: Not on file  . Alcohol Use: No  . Drug Use: Not on file  . Sexually Active: Not on file   Other Topics Concern  .  Not on file   Social History Narrative   Married, retired.     PHYSICAL EXAM BP 141/70  Pulse 73  Resp 23  Ht 5\' 11"  (1.803 m)  Wt 284 lb 1.9 oz (128.876 kg)  BMI 39.63 kg/m2  SpO2 93%   ECG:  ASSESSMENT AND PLAN

## 2010-12-10 NOTE — Patient Instructions (Signed)
Continue all current medications. Your physician wants you to follow up in: 6 months.  You will receive a reminder letter in the mail one-two months in advance.  If you don't receive a letter, please call our office to schedule the follow up appointment   

## 2010-12-12 NOTE — Assessment & Plan Note (Signed)
No recurrent arrhythmias no indication for anticoagulation at the present time

## 2010-12-12 NOTE — Assessment & Plan Note (Signed)
Continue medical therapy with dual antiplatelet therapy. The patient has a bare-metal stents the stents placed in the vein graft. Ejection fraction is stable by current echocardiogram

## 2010-12-12 NOTE — Assessment & Plan Note (Signed)
Followed by Dr. Reuel Boom. Apparently chronically elevated

## 2010-12-12 NOTE — Progress Notes (Signed)
HPI The patient is a 65 year old male with history of coronary bypass grafting in 1998. The patient also underwent bare-metal stenting of a vein graft to the right coronary artery 2010. Subsequently underwent back surgery in April 2011 and did well. The patient has a history of rheumatoid arthritis and is on infliximab infusions every month. The patient is significantly limited in his activity because of his rheumatoid arthritis. He has been continued on dual antiplatelet therapy with aspirin and Plavix. He has a history of atrial flutter but continues to remain in normal sinus rhythm. He also reports no palpitations. He has chronically had elevated liver function tests which as discussed with Dr. Reuel Boom. From a cardiac perspective the patient appears to be doing well. He reports no chest pain he does have shortness of breath secondary to underlying COPD and still chews tobacco. He significantly deconditioned because of his underlying rheumatoid arthritis and limited mobility. We did a bedside echocardiogram today which showed normal LV systolic function with no wall motion abnormalities. Estimated ejection fraction was 60-65%. There was no significant mitral regurgitation or aortic insufficiency. Right heart structures appear to be normal.  No Known Allergies  Current Outpatient Prescriptions on File Prior to Visit  Medication Sig Dispense Refill  . albuterol-ipratropium (COMBIVENT) 18-103 MCG/ACT inhaler Inhale 2 puffs into the lungs every 6 (six) hours as needed.        Marland Kitchen ascorbic acid (VITAMIN C) 500 MG tablet Take 500 mg by mouth 2 (two) times daily.        Marland Kitchen aspirin 81 MG EC tablet Take 81 mg by mouth daily.        . clopidogrel (PLAVIX) 75 MG tablet Take 75 mg by mouth daily.        Marland Kitchen co-enzyme Q-10 30 MG capsule Take 30 mg by mouth daily.        Marland Kitchen ezetimibe (ZETIA) 10 MG tablet Take 10 mg by mouth daily.        Marland Kitchen inFLIXimab (REMICADE) 100 MG injection Inject into the vein every 30 (thirty)  days.        . insulin NPH-insulin regular (NOVOLIN 70/30) (70-30) 100 UNIT/ML injection Inject 63 Units into the skin 2 (two) times daily.        Marland Kitchen ipratropium-albuterol (DUONEB) 0.5-2.5 (3) MG/3ML SOLN Take 3 mLs by nebulization 4 (four) times daily as needed.        Marland Kitchen lisinopril (PRINIVIL,ZESTRIL) 40 MG tablet Take 40 mg by mouth daily.        . metFORMIN (GLUCOPHAGE) 1000 MG tablet Take 1,000 mg by mouth 2 (two) times daily.        . metoprolol (TOPROL-XL) 50 MG 24 hr tablet Take 50 mg by mouth 2 (two) times daily.        . Multiple Vitamin (MULTIVITAMIN) tablet Take 1 tablet by mouth daily.        Marland Kitchen oxyCODONE-acetaminophen (PERCOCET) 5-325 MG per tablet Take 1 tablet by mouth every 4 (four) hours as needed.        . predniSONE (DELTASONE) 5 MG tablet Take 5 mg by mouth 2 (two) times daily.        . rosuvastatin (CRESTOR) 5 MG tablet Take 5 mg by mouth every other day.        . Omega-3 Fatty Acids (FISH OIL) 1000 MG CAPS Take 1 capsule by mouth daily. 1000 mg?       . vitamin E 400 UNIT capsule Take 400 Units by mouth daily.  Past Medical History  Diagnosis Date  . Rheumatoid arthritis     infliximab infusions every month   . COPD (chronic obstructive pulmonary disease)   . Elevated liver function tests     Past Surgical History  Procedure Date  . Coronary stent placement     s/p bare metal stent to the vein graft to the RCA 9/20. nml LV function   . Appendectomy   . Coronary artery bypass graft 1998  . Anterior cervical decompression     with posterior decompression and fusion     No family history on file.  History   Social History  . Marital Status: Married    Spouse Name: N/A    Number of Children: N/A  . Years of Education: N/A   Occupational History  . Not on file.   Social History Main Topics  . Smoking status: Former Games developer  . Smokeless tobacco: Not on file  . Alcohol Use: No  . Drug Use: Not on file  . Sexually Active: Not on file   Other  Topics Concern  . Not on file   Social History Narrative   Married, retired.    [id number] positives as outlined above. The remainder of the 18  point review of systems is negative  PHYSICAL EXAM BP 141/70  Pulse 73  Resp 23  Ht 5\' 11"  (1.803 m)  Wt 284 lb 1.9 oz (128.876 kg)  BMI 39.63 kg/m2  SpO2 93%  General: Overweight male in no distress Head: Normocephalic and atraumatic Eyes:PERRLA/EOMI intact, conjunctiva and lids normal Ears: No deformity or lesions Mouth:normal dentition, normal posterior pharynx Neck: Supple, no JVD.  No masses, thyromegaly or abnormal cervical nodes Lungs: Normal breath sounds bilaterally without wheezing.  Normal percussion Cardiac: regular rate and rhythm with normal S1 and S2, no S3 or S4.  PMI is normal.  No pathological murmurs Abdomen: Normal bowel sounds, abdomen is soft and nontender without masses, organomegaly or hernias noted.  No hepatosplenomegaly MSK: Decreased muscle strength and difficulty with gait secondary to rheumatoid arthritis  Vascular: Pulse is normal in all 4 extremities Extremities: No peripheral pitting edema Neurologic: Alert and oriented x 3 Skin: Intact without lesions or rashes Lymphatics: No significant adenopathy Psychologic: Normal affect  Limited bedside echocardiogram: Results outlined above  ECG: Not available  ASSESSMENT AND PLAN

## 2011-01-21 LAB — DIFFERENTIAL
Eosinophils Absolute: 0.7
Eosinophils Relative: 4
Lymphs Abs: 3.7
Monocytes Relative: 9

## 2011-01-21 LAB — COMPREHENSIVE METABOLIC PANEL
ALT: 113 — ABNORMAL HIGH
AST: 78 — ABNORMAL HIGH
CO2: 28
Calcium: 10
GFR calc Af Amer: >60
GFR calc non Af Amer: >60
Sodium: 136
Total Protein: 6.5

## 2011-01-21 LAB — URINALYSIS, ROUTINE W REFLEX MICROSCOPIC
Glucose, UA: NEGATIVE
Hgb urine dipstick: NEGATIVE
Specific Gravity, Urine: 1.025
pH: 5.5

## 2011-01-21 LAB — CBC
MCHC: 34.2
RDW: 14.1

## 2011-02-12 LAB — COMPREHENSIVE METABOLIC PANEL
AST: 62 — ABNORMAL HIGH
Alkaline Phosphatase: 115
BUN: 21
CO2: 25
Chloride: 102
Creatinine, Ser: 1.19
GFR calc non Af Amer: >60
Total Bilirubin: 1

## 2011-02-12 LAB — PROTIME-INR: Prothrombin Time: 13.1

## 2011-02-12 LAB — URINALYSIS, ROUTINE W REFLEX MICROSCOPIC
Bilirubin Urine: NEGATIVE
Glucose, UA: NEGATIVE
Hgb urine dipstick: NEGATIVE
Ketones, ur: NEGATIVE
Protein, ur: NEGATIVE

## 2011-02-12 LAB — DIFFERENTIAL
Basophils Absolute: 0.1
Basophils Relative: 1
Eosinophils Relative: 5
Lymphocytes Relative: 26
Neutro Abs: 8.3 — ABNORMAL HIGH

## 2011-02-12 LAB — CBC
HCT: 45.8
MCV: 85.5
RBC: 5.36
WBC: 13.7 — ABNORMAL HIGH

## 2011-02-12 LAB — ABO/RH: ABO/RH(D): O POS

## 2011-11-19 ENCOUNTER — Telehealth: Payer: Self-pay | Admitting: Internal Medicine

## 2011-11-19 NOTE — Telephone Encounter (Signed)
called wife with new pt appt

## 2011-11-22 ENCOUNTER — Telehealth: Payer: Self-pay | Admitting: Internal Medicine

## 2011-11-22 NOTE — Telephone Encounter (Signed)
Referred by Dr. Dareen Piano Dx- Thrombocytopenia. NP package mailed out.

## 2011-11-24 ENCOUNTER — Ambulatory Visit (HOSPITAL_BASED_OUTPATIENT_CLINIC_OR_DEPARTMENT_OTHER): Payer: Medicare Other | Admitting: Internal Medicine

## 2011-11-24 ENCOUNTER — Other Ambulatory Visit (HOSPITAL_BASED_OUTPATIENT_CLINIC_OR_DEPARTMENT_OTHER): Payer: Medicare Other

## 2011-11-24 ENCOUNTER — Encounter: Payer: Self-pay | Admitting: Internal Medicine

## 2011-11-24 ENCOUNTER — Ambulatory Visit: Payer: Medicare Other

## 2011-11-24 VITALS — BP 133/75 | HR 68 | Temp 96.9°F | Ht 71.0 in | Wt 291.8 lb

## 2011-11-24 DIAGNOSIS — D696 Thrombocytopenia, unspecified: Secondary | ICD-10-CM

## 2011-11-24 LAB — CBC WITH DIFFERENTIAL/PLATELET
Basophils Absolute: 0 10*3/uL (ref 0.0–0.1)
Eosinophils Absolute: 0.1 10*3/uL (ref 0.0–0.5)
HCT: 45.3 % (ref 38.4–49.9)
HGB: 15.1 g/dL (ref 13.0–17.1)
MONO#: 0.4 10*3/uL (ref 0.1–0.9)
NEUT#: 1.7 10*3/uL (ref 1.5–6.5)
NEUT%: 50.5 % (ref 39.0–75.0)
RDW: 14.6 % (ref 11.0–14.6)
WBC: 3.3 10*3/uL — ABNORMAL LOW (ref 4.0–10.3)
lymph#: 1.1 10*3/uL (ref 0.9–3.3)

## 2011-11-24 LAB — COMPREHENSIVE METABOLIC PANEL
Albumin: 3.9 g/dL (ref 3.5–5.2)
BUN: 18 mg/dL (ref 6–23)
CO2: 24 mEq/L (ref 19–32)
Calcium: 10.2 mg/dL (ref 8.4–10.5)
Chloride: 102 mEq/L (ref 96–112)
Creatinine, Ser: 0.95 mg/dL (ref 0.50–1.35)
Potassium: 4.7 mEq/L (ref 3.5–5.3)

## 2011-11-24 LAB — LACTATE DEHYDROGENASE: LDH: 159 U/L (ref 94–250)

## 2011-11-24 NOTE — Progress Notes (Signed)
[facility] [phone]   [phone]  CONSULT NOTE  REASON FOR CONSULTATION:  66 years old white male with thrombocytopenia  HPI [name] is a 66 y.o. male was past medical history significant for hypertension, diabetes mellitus, dyslipidemia, history of coronary artery disease status post CABG in 1998 as well as stent placement in addition to a history of rheumatoid arthritis. The patient was seen by his rheumatologist Dr. Dareen Piano for routine evaluation and blood work. CBC on 11/18/2011 showed platelets count were down to 73,000 from 145,000 on 07/29/2011. Repeat CBC on 11/19/2011 showed the platelets count were further down to 69,000. He was referred to me today for evaluation and recommendation regarding his thrombocytopenia. Previous CBC on 01/07/2011 showed platelets count 166,000 and on 06/17/2011 platelets count were 148,000. The patient has been on treatment with Remicade for his rheumatoid arthritis but this was change in April 2013 Actemra, which can result dose related neutropenia and thrombocytopenia. The patient also mentioned that he did not have any great response with the new medication regarding his rheumatoid arthritis. The patient is currently on several other medications that can cause thrombocytopenia including Crestor and Zetia as well as Plavix but he has been on this medication for several years. He denied having any bleeding issues today, no bruises or ecchymosis.  He has no significant weight loss or night sweats. No kidney disease. No history of hemolytic anemia. The patient is married and has 5 children. 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 He has a history of smoking but quit in 1998. No alcohol or drug abuse. @SFHPI @  Past Medical History  Diagnosis Date  . Rheumatoid arthritis     infliximab infusions every month   . COPD (chronic obstructive pulmonary disease)   . Elevated liver function  tests   . Hypertension   . Diabetes mellitus     Past Surgical History  Procedure Date  . Coronary stent placement     s/p bare metal stent to the vein graft to the RCA 9/20. nml LV function   . Appendectomy   . Coronary artery bypass graft 1998  . Anterior cervical decompression     with posterior decompression and fusion     Family History  Problem Relation Age of Onset  . Hypertension Mother   . Cancer Father   . Stroke Brother     Social History History  Substance Use Topics  . Smoking status: Former Smoker -- 3.0 packs/day for 35 years    Types: Cigarettes    Quit date: 11/23/1996  . Smokeless tobacco: Current User    Types: Chew  . Alcohol Use: No    No Known Allergies  Current Outpatient Prescriptions  Medication Sig Dispense Refill  . albuterol-ipratropium (COMBIVENT) 18-103 MCG/ACT inhaler Inhale 2 puffs into the lungs every 6 (six) hours as needed.        Marland Kitchen ascorbic acid (VITAMIN C) 500 MG tablet Take 500 mg by mouth 2 (two) times daily.        Marland Kitchen CETIRIZINE HCL PO Take 10 mg by mouth daily.        . clopidogrel (PLAVIX) 75 MG tablet Take 75 mg by mouth daily.        Marland Kitchen co-enzyme Q-10 30 MG capsule Take 30 mg by mouth daily.        Marland Kitchen desonide (DESOWEN) 0.05 % cream Apply topically at bedtime as needed and may repeat dose one  time if needed.      . ezetimibe (ZETIA) 10 MG tablet Take 10 mg by mouth daily.        . insulin NPH-insulin regular (NOVOLIN 70/30) (70-30) 100 UNIT/ML injection Inject 63 Units into the skin 2 (two) times daily.        Marland Kitchen ipratropium-albuterol (DUONEB) 0.5-2.5 (3) MG/3ML SOLN Take 3 mLs by nebulization 4 (four) times daily as needed.        Marland Kitchen ketoconazole (NIZORAL) 2 % cream Apply topically daily.      Marland Kitchen lisinopril (PRINIVIL,ZESTRIL) 40 MG tablet Take 40 mg by mouth daily.        Marland Kitchen loratadine (CLARITIN) 10 MG tablet Take 10 mg by mouth daily.      . metFORMIN (GLUCOPHAGE) 1000 MG tablet Take 1,000 mg by mouth 2 (two) times daily.          . metoprolol (TOPROL-XL) 50 MG 24 hr tablet Take 50 mg by mouth 2 (two) times daily.        . Multiple Vitamin (MULTIVITAMIN) tablet Take 1 tablet by mouth daily.        Marland Kitchen oxyCODONE-acetaminophen (PERCOCET) 5-325 MG per tablet Take 1 tablet by mouth every 4 (four) hours as needed.        . predniSONE (DELTASONE) 5 MG tablet Take 5 mg by mouth 2 (two) times daily.        . rosuvastatin (CRESTOR) 5 MG tablet Take 5 mg by mouth every other day.          Review of Systems  A comprehensive review of systems was negative.  Physical Exam  ZOX:WRUEA, healthy, no distress, well nourished and well developed SKIN: skin color, texture, turgor are normal HEAD: Normocephalic, No masses, lesions, tenderness or abnormalities EYES: normal EARS: External ears normal OROPHARYNX:no exudate and no erythema  NECK: supple, no adenopathy LYMPH:  no palpable lymphadenopathy, no hepatosplenomegaly LUNGS: clear to auscultation  HEART: regular rate & rhythm and no murmurs ABDOMEN:abdomen soft, non-tender, normal bowel sounds and no masses or organomegaly BACK: Back symmetric, no curvature. EXTREMITIES:no edema, no skin discoloration, no clubbing, no cyanosis  NEURO: alert & oriented x 3 with fluent speech, no focal motor/sensory deficits  PERFORMANCE STATUS: ECOG 1  LABORATORY DATA: Lab Results  Component Value Date   WBC 3.3* 11/24/2011   HGB 15.1 11/24/2011   HCT 45.3 11/24/2011   MCV 93.8 11/24/2011   PLT 83* 11/24/2011      Chemistry      Component Value Date/Time   NA 138 07/25/2009 1139   K 4.5 07/25/2009 1139   CL 104 07/25/2009 1139   CO2 28 07/25/2009 1139   BUN 17 07/25/2009 1139   CREATININE 0.85 07/25/2009 1139      Component Value Date/Time   CALCIUM 9.7 07/25/2009 1139   ALKPHOS 97 07/25/2009 1139   AST 51* 07/25/2009 1139   ALT 69* 07/25/2009 1139   BILITOT 0.4 07/25/2009 1139       RADIOGRAPHIC STUDIES: No results found.  ASSESSMENT: This is a very pleasant 66 years old white  male presented for evaluation of thrombocytopenia which most likely drug-induced secondary to treatment with Actemra which is associated with those related to neutropenia and thrombocytopenia and there was time coincidence between the administration of the medication in April 2013 and thrombocytopenia.  PLAN: I recommended for the patient to discuss with Dr. Dareen Piano switching this medication to a different agent for his rheumatoid arthritis or at least reduce the dose to  avoid thrombocytopenia and Leukocytopenia. His platelets count today are 83,000 and the patient is not at risk for any significant bleeding issues. I don't see a need for any further intervention from my side at this point. I asked the patient to continue his routine followup visit with his primary care physician as well as his rheumatologist. I would be happy to see him in the future if needed.  All questions were answered. The patient knows to call the clinic with any problems, questions or concerns. We can certainly see the patient much sooner if necessary.  Thank you so much for allowing me to participate in the care of [name]. I will continue to follow up the patient with you and assist in his care.  I spent 30 minutes counseling the patient face to face. The total time spent in the appointment was 50 minutes.   Dathan Attia K. 11/24/2011, 3:53 PM

## 2011-11-25 ENCOUNTER — Encounter: Payer: Self-pay | Admitting: Internal Medicine

## 2011-11-25 NOTE — Progress Notes (Signed)
Patient came in yesterday as a new patient with his wife and he has two insurance.The patient wife said that they are [place] as far as financial assistance.

## 2012-01-06 ENCOUNTER — Ambulatory Visit: Payer: Medicare Other | Admitting: Cardiology

## 2012-02-07 ENCOUNTER — Encounter: Payer: Self-pay | Admitting: Cardiology

## 2012-02-07 ENCOUNTER — Ambulatory Visit (INDEPENDENT_AMBULATORY_CARE_PROVIDER_SITE_OTHER): Payer: Medicare Other | Admitting: Cardiology

## 2012-02-07 VITALS — BP 124/70 | HR 79 | Ht 71.0 in | Wt 282.0 lb

## 2012-02-07 DIAGNOSIS — I25810 Atherosclerosis of coronary artery bypass graft(s) without angina pectoris: Secondary | ICD-10-CM

## 2012-02-07 DIAGNOSIS — Z8639 Personal history of other endocrine, nutritional and metabolic disease: Secondary | ICD-10-CM

## 2012-02-07 DIAGNOSIS — I4892 Unspecified atrial flutter: Secondary | ICD-10-CM

## 2012-02-07 NOTE — Patient Instructions (Addendum)

## 2012-02-07 NOTE — Progress Notes (Signed)
HPI The patient presents for followup of known coronary disease. Since he was last seen he said no new cardiovascular complaints. He's not particularly active being limited by COPD and back pain. However, he does not get any chest discomfort, neck or arm discomfort. He does not have palpitations, presyncope or syncope. He has had no PND or orthopnea. He's had no weight gain (or weight loss) or new edema.  No Known Allergies  Current Outpatient Prescriptions  Medication Sig Dispense Refill  . albuterol-ipratropium (COMBIVENT) 18-103 MCG/ACT inhaler Inhale 2 puffs into the lungs every 6 (six) hours as needed.        Marland Kitchen ascorbic acid (VITAMIN C) 500 MG tablet Take 500 mg by mouth 2 (two) times daily.        Marland Kitchen CETIRIZINE HCL PO Take 10 mg by mouth daily.        . clopidogrel (PLAVIX) 75 MG tablet Take 75 mg by mouth daily.        Marland Kitchen co-enzyme Q-10 30 MG capsule Take 30 mg by mouth daily.        Marland Kitchen desonide (DESOWEN) 0.05 % cream Apply topically at bedtime as needed and may repeat dose one time if needed.      . ezetimibe (ZETIA) 10 MG tablet Take 10 mg by mouth daily.        . insulin NPH-insulin regular (NOVOLIN 70/30) (70-30) 100 UNIT/ML injection Inject 70 Units into the skin 2 (two) times daily.       Marland Kitchen ipratropium-albuterol (DUONEB) 0.5-2.5 (3) MG/3ML SOLN Take 3 mLs by nebulization 4 (four) times daily as needed.        Marland Kitchen ketoconazole (NIZORAL) 2 % cream Apply topically daily.      Marland Kitchen lisinopril (PRINIVIL,ZESTRIL) 40 MG tablet Take 40 mg by mouth daily.        Marland Kitchen loratadine (CLARITIN) 10 MG tablet Take 10 mg by mouth daily.      . metFORMIN (GLUCOPHAGE) 1000 MG tablet Take 1,000 mg by mouth 2 (two) times daily.        . metoprolol (TOPROL-XL) 50 MG 24 hr tablet Take 50 mg by mouth 2 (two) times daily.        . Multiple Vitamin (MULTIVITAMIN) tablet Take 1 tablet by mouth daily.        . NON FORMULARY Simponi Aria infusion every other month      . oxyCODONE-acetaminophen (PERCOCET) 5-325 MG  per tablet Take 1 tablet by mouth every 4 (four) hours as needed.        . predniSONE (DELTASONE) 5 MG tablet Take 5 mg by mouth daily.       . rosuvastatin (CRESTOR) 5 MG tablet Take 5 mg by mouth every other day.         Past Medical History  Diagnosis Date  . Rheumatoid arthritis     infliximab infusions every month   . COPD (chronic obstructive pulmonary disease)   . Elevated liver function tests   . Hypertension   . Diabetes mellitus     Past Surgical History  Procedure Date  . Coronary stent placement     s/p bare metal stent to the vein graft to the RCA 9/20. nml LV function   . Appendectomy   . Coronary artery bypass graft 1998  . Anterior cervical decompression     with posterior decompression and fusion     ROS:  As stated in the HPI and negative for all other systems.  PHYSICAL EXAM  BP 124/70  Pulse 79  Ht 5\' 11"  (1.803 m)  Wt 127.914 kg (282 lb)  BMI 39.33 kg/m2 GENERAL:  Well appearing HEENT:  Pupils equal round and reactive, fundi not visualized, oral mucosa unremarkable NECK:  No jugular venous distention, waveform within normal limits, carotid upstroke brisk and symmetric, no bruits, no thyromegaly LYMPHATICS:  No cervical, inguinal adenopathy LUNGS:  Clear to auscultation bilaterally BACK:  No CVA tenderness CHEST:  Well healed sternotomy scar. HEART:  PMI not displaced or sustained,S1 and S2 within normal limits, no S3, no S4, no clicks, no rubs, no murmurs ABD:  Flat, positive bowel sounds normal in frequency in pitch, no bruits, no rebound, no guarding, no midline pulsatile mass, no hepatomegaly, no splenomegaly, obese EXT:  2 plus pulses throughout, no edema, no cyanosis no clubbing SKIN:  No rashes no nodules NEURO:  Cranial nerves II through XII grossly intact, motor grossly intact throughout PSYCH:  Cognitively intact, oriented to person place and time   ASSESSMENT AND PLAN   CORONARY ATHEROSCLEROSIS OF ARTERY BYPASS GRAFT -  The patient has  had no new symptoms since his last catheterization in 2010. At this point no further cardiovascular testing is suggested. He will continue with risk reduction.  ATRIAL FLUTTER -  No recurrent arrhythmias no indication for anticoagulation at the present time  DYSLIPIDEMIA - This is followed by  Donzetta Sprung, MD.  The goal should be an LDL less than 70 and HDL greater than 40.

## 2013-02-12 ENCOUNTER — Ambulatory Visit (INDEPENDENT_AMBULATORY_CARE_PROVIDER_SITE_OTHER): Payer: Medicare Other | Admitting: Cardiovascular Disease

## 2013-02-12 ENCOUNTER — Other Ambulatory Visit: Payer: Self-pay | Admitting: *Deleted

## 2013-02-12 ENCOUNTER — Encounter: Payer: Self-pay | Admitting: Cardiovascular Disease

## 2013-02-12 VITALS — BP 107/57 | HR 79 | Ht 71.0 in | Wt 285.0 lb

## 2013-02-12 DIAGNOSIS — J4489 Other specified chronic obstructive pulmonary disease: Secondary | ICD-10-CM

## 2013-02-12 DIAGNOSIS — J449 Chronic obstructive pulmonary disease, unspecified: Secondary | ICD-10-CM

## 2013-02-12 DIAGNOSIS — I10 Essential (primary) hypertension: Secondary | ICD-10-CM

## 2013-02-12 DIAGNOSIS — R011 Cardiac murmur, unspecified: Secondary | ICD-10-CM

## 2013-02-12 DIAGNOSIS — R0609 Other forms of dyspnea: Secondary | ICD-10-CM

## 2013-02-12 DIAGNOSIS — I25810 Atherosclerosis of coronary artery bypass graft(s) without angina pectoris: Secondary | ICD-10-CM

## 2013-02-12 DIAGNOSIS — I4892 Unspecified atrial flutter: Secondary | ICD-10-CM

## 2013-02-12 MED ORDER — LOSARTAN POTASSIUM 25 MG PO TABS: 25.0000 mg | ORAL_TABLET | Freq: Every day | ORAL | Status: DC

## 2013-02-12 NOTE — Progress Notes (Signed)
SUBJECTIVE: SUBJECTIVE: Mr. Nedeau has a h/o CABG with stent to a vein graft, HTN, dyslipidemia, paroxysmal atrial flutter, diabetes, COPD, and rheumatoid arthritis. ECG today shows sinus rhythm, 83 bpm, RBBB with PVC's, and a nonspecific T wave abnormality, not remarkably different from one done a year ago.  He has chronic SOB due to COPD which has progressively worsened in the past 6 months to a year, according to his wife and the patient. His PCP made some medication adjustments which appear to have helped.  His last cardiac cath was in 2010. He very seldomly gets left axillary chest pain. He denies any current leg swelling. He denies palpitations, lightheadedness, and syncope.  Prior to CABG, his symptoms were related to retrosternal chest pain.  He reportedly has not tolerated Lipitor in the past due to myalgias. He had been on Crestor but due to insurance reasons, he was started on Zetia.  He stopped infusions for RA as he felt it was ineffective.   No Known Allergies  Current Outpatient Prescriptions  Medication Sig Dispense Refill  . albuterol-ipratropium (COMBIVENT) 18-103 MCG/ACT inhaler Inhale 2 puffs into the lungs every 6 (six) hours as needed.        Marland Kitchen ascorbic acid (VITAMIN C) 500 MG tablet Take 500 mg by mouth daily.       Marland Kitchen CETIRIZINE HCL PO Take 10 mg by mouth daily.        . clopidogrel (PLAVIX) 75 MG tablet Take 75 mg by mouth daily.        Marland Kitchen co-enzyme Q-10 30 MG capsule Take 30 mg by mouth daily.        Marland Kitchen desonide (DESOWEN) 0.05 % cream Apply topically at bedtime as needed and may repeat dose one time if needed.      . ezetimibe (ZETIA) 10 MG tablet Take 10 mg by mouth daily.        . insulin NPH-insulin regular (NOVOLIN 70/30) (70-30) 100 UNIT/ML injection Inject 76-78 Units into the skin 2 (two) times daily.       Marland Kitchen ipratropium-albuterol (DUONEB) 0.5-2.5 (3) MG/3ML SOLN Take 3 mLs by nebulization 4 (four) times daily as needed.        Marland Kitchen ketoconazole  (NIZORAL) 2 % cream Apply topically daily.      Marland Kitchen loratadine (CLARITIN) 10 MG tablet Take 10 mg by mouth daily.      Marland Kitchen losartan (COZAAR) 50 MG tablet Take 50 mg by mouth daily.      . metFORMIN (GLUCOPHAGE) 1000 MG tablet Take 1,000 mg by mouth 2 (two) times daily.        . metoprolol (TOPROL-XL) 50 MG 24 hr tablet Take 50 mg by mouth 2 (two) times daily.        . Multiple Vitamin (MULTIVITAMIN) tablet Take 1 tablet by mouth daily.        Marland Kitchen oxyCODONE-acetaminophen (PERCOCET) 5-325 MG per tablet Take 1 tablet by mouth every 4 (four) hours as needed.        . predniSONE (DELTASONE) 5 MG tablet Take 5 mg by mouth daily.        No current facility-administered medications for this visit.    Past Medical History  Diagnosis Date  . Rheumatoid arthritis(714.0)     infliximab infusions every month   . COPD (chronic obstructive pulmonary disease)   . Elevated liver function tests   . Hypertension   . Diabetes mellitus     Past Surgical History  Procedure Laterality  Date  . Coronary stent placement      s/p bare metal stent to the vein graft to the RCA 9/20. nml LV function   . Appendectomy    . Coronary artery bypass graft  1998  . Anterior cervical decompression      with posterior decompression and fusion     History   Social History  . Marital Status: Married    Spouse Name: N/A    Number of Children: N/A  . Years of Education: N/A   Occupational History  . Not on file.   Social History Main Topics  . Smoking status: Former Smoker -- 3.00 packs/day for 35 years    Types: Cigarettes    Quit date: 11/23/1996  . Smokeless tobacco: Current User    Types: Chew     Comment: started chewing in 2001/chews 2-3 packs per week  . Alcohol Use: No  . Drug Use: Not on file  . Sexual Activity: Not on file   Other Topics Concern  . Not on file   Social History Narrative   Married, retired.      Filed Vitals:   02/12/13 1326 02/12/13 1342  BP: 97/58 107/57  Pulse: 79 79    Height: 5\' 11"  (1.803 m)   Weight: 285 lb (129.275 kg)   SpO2: 93%     PHYSICAL EXAM General: NAD Neck: No JVD, no thyromegaly or thyroid nodule.  Lungs: Dry crackles at bases bilaterally with wheezes also heard, with diminished respiratory effort. CV: Nondisplaced PMI.  Heart regular with premature contractions appreciated, distant S1/S2, no S3/S4, II/VI harsh systolic murmur at RUSB.  No peripheral edema.  No carotid bruit.  Normal pedal pulses.  Abdomen: Soft, nontender, no hepatosplenomegaly, no distention.  Neurologic: Alert and oriented x 3.  Psych: Normal affect. Extremities: No clubbing or cyanosis.   ECG: reviewed and available in electronic records.      ASSESSMENT AND PLAN: 1. CAD: no anginal discomfort. He does have dyspnea on exertion which may more be related to his COPD and potentially interstitial lung disease secondary to rheumatoid arthritis. I will obtain an echocardiogram to evaluate his systolic and diastolic function, as well as to assess for valvular disease given the murmur I appreciated along with his progressive dyspnea. Continues on Plavix. 2. HTN: relatively hypotensive today, albeit asymptomatic. As per he and his wife's request, I will reduce losartan to 25 mg daily and have him f/u with a nurse to see if his home BP cuff is calibrated appropriately (wrist cuff). I've asked him to check his BP 3-4 times per week over the next several weeks and to inform me of these results. 3. Hyperlipidemia: on Zetia. Followed by PCP. 4. Paroxysmal atrial flutter: currently in sinus rhythm.  5. Dyspnea on exertion and murmur: see #1. Will obtain a chest xray as well.   Prentice Docker, M.D., F.A.C.C.

## 2013-02-12 NOTE — Patient Instructions (Addendum)
   Decrease Losartan to 25mg  daily - new sent to pharm  Your physician has requested that you have an echocardiogram. Echocardiography is a painless test that uses sound waves to create images of your heart. It provides your doctor with information about the size and shape of your heart and how well your heart's chambers and valves are working. This procedure takes approximately one hour. There are no restrictions for this procedure. A chest x-ray takes a picture of the organs and structures inside the chest, including the heart, lungs, and blood vessels. This test can show several things, including, whether the heart is enlarges; whether fluid is building up in the lungs; and whether pacemaker / defibrillator leads are still in place. Your physician has requested that you regularly monitor and record your blood pressure readings at home. Please check at varying times of the day over the next 3-4 weeks and return readings to office.   Nurse visit to compare your monitor with our BP machine Continue all other medications.   Office will contact with results via phone or letter.   Your physician wants you to follow up in: 6 months.  You will receive a reminder letter in the mail one-two months in advance.  If you don't receive a letter, please call our office to schedule the follow up appointment

## 2013-02-14 ENCOUNTER — Encounter: Payer: Self-pay | Admitting: *Deleted

## 2013-02-15 ENCOUNTER — Other Ambulatory Visit (INDEPENDENT_AMBULATORY_CARE_PROVIDER_SITE_OTHER): Payer: Medicare Other

## 2013-02-15 ENCOUNTER — Other Ambulatory Visit: Payer: Self-pay

## 2013-02-15 DIAGNOSIS — I25810 Atherosclerosis of coronary artery bypass graft(s) without angina pectoris: Secondary | ICD-10-CM

## 2013-02-15 DIAGNOSIS — I359 Nonrheumatic aortic valve disorder, unspecified: Secondary | ICD-10-CM

## 2013-02-15 DIAGNOSIS — R0609 Other forms of dyspnea: Secondary | ICD-10-CM

## 2013-03-16 ENCOUNTER — Ambulatory Visit (INDEPENDENT_AMBULATORY_CARE_PROVIDER_SITE_OTHER): Payer: Medicare Other | Admitting: Cardiovascular Disease

## 2013-03-16 VITALS — BP 136/72 | HR 76 | Ht 71.0 in | Wt 296.0 lb

## 2013-03-16 DIAGNOSIS — I25810 Atherosclerosis of coronary artery bypass graft(s) without angina pectoris: Secondary | ICD-10-CM

## 2013-03-16 DIAGNOSIS — Z79899 Other long term (current) drug therapy: Secondary | ICD-10-CM

## 2013-03-16 DIAGNOSIS — J849 Interstitial pulmonary disease, unspecified: Secondary | ICD-10-CM

## 2013-03-16 DIAGNOSIS — I350 Nonrheumatic aortic (valve) stenosis: Secondary | ICD-10-CM | POA: Insufficient documentation

## 2013-03-16 DIAGNOSIS — I359 Nonrheumatic aortic valve disorder, unspecified: Secondary | ICD-10-CM

## 2013-03-16 DIAGNOSIS — M069 Rheumatoid arthritis, unspecified: Secondary | ICD-10-CM

## 2013-03-16 DIAGNOSIS — E785 Hyperlipidemia, unspecified: Secondary | ICD-10-CM

## 2013-03-16 DIAGNOSIS — J449 Chronic obstructive pulmonary disease, unspecified: Secondary | ICD-10-CM

## 2013-03-16 DIAGNOSIS — J8410 Pulmonary fibrosis, unspecified: Secondary | ICD-10-CM

## 2013-03-16 DIAGNOSIS — R0609 Other forms of dyspnea: Secondary | ICD-10-CM

## 2013-03-16 DIAGNOSIS — I10 Essential (primary) hypertension: Secondary | ICD-10-CM

## 2013-03-16 MED ORDER — PREDNISONE 10 MG PO TABS: ORAL_TABLET | ORAL | Status: DC

## 2013-03-16 MED ORDER — FUROSEMIDE 40 MG PO TABS: ORAL_TABLET | ORAL | Status: DC

## 2013-03-16 NOTE — Patient Instructions (Addendum)
   Lasix 40mg  daily x 5 days only, then STOP   Lab for BMET - due around November 20 or 21 Office will contact with results via phone or letter.    Prednisone 20mg  daily x 3 days, then 10mg  daily x 4 days, then resume 5mg  daily as you were previously taking Continue all other medications.   Pulmonology referral for evaluation of COPD  Echo - due just prior to next office visit  Your physician wants you to follow up in: 6 months.  You will receive a reminder letter in the mail one-two months in advance.  If you don't receive a letter, please call our office to schedule the follow up appointment

## 2013-03-16 NOTE — Progress Notes (Signed)
Patient ID: [name], male   DOB: 1945-11-05, 67 y.o.   MRN: [id number]      SUBJECTIVE: [name] has a h/o CABG with stent to a vein graft, HTN, dyslipidemia, paroxysmal atrial flutter, diabetes, COPD, and rheumatoid arthritis.  His most recent ECG on 02-12-13 showed sinus rhythm, 83 bpm, RBBB with PVC's, and a nonspecific T wave abnormality, not remarkably different from one done a year ago.   He has chronic SOB due to COPD which has progressively worsened in the past 6 months to a year, according to his wife and the patient. His PCP made some medication adjustments which appear to have helped.  His last cardiac cath was in 2010. He very seldomly gets left axillary chest pain. He denies any current leg swelling. He denies palpitations, lightheadedness, and syncope at present.  Prior to CABG, his symptoms were related to retrosternal chest pain.  He reportedly has not tolerated Lipitor in the past due to myalgias. He had been on Crestor but due to insurance reasons, he was started on Zetia.  He stopped infusions for RA as he felt it was ineffective.  At his last visit in mid-October, I ordered a chest x-ray and echocardiogram to evaluate his dyspnea on exertion and murmur. A chest x-ray showed bibasilar scarring vs atelectasis. His echocardiogram showed normal left ventricular systolic function with an ejection fraction of 60-65%, grade 1 diastolic dysfunction, and moderate aortic stenosis with a mean gradient of 35 mmHg and a peak velocity of 3.75 m/s.  He believes he may have had PFT's about 2 years ago at the time of applying for disability. He has some mild left leg swelling, primarily around the ankle. He denies chest pain.  No Known Allergies  Current Outpatient Prescriptions  Medication Sig Dispense Refill  . albuterol-ipratropium (COMBIVENT) 18-103 MCG/ACT inhaler Inhale 2 puffs into the lungs every 6 (six) hours as needed.        Marland Kitchen ascorbic acid (VITAMIN C) 500 MG tablet  Take 500 mg by mouth daily.       Marland Kitchen CETIRIZINE HCL PO Take 10 mg by mouth daily.        . clopidogrel (PLAVIX) 75 MG tablet Take 75 mg by mouth daily.        Marland Kitchen co-enzyme Q-10 30 MG capsule Take 30 mg by mouth daily.        Marland Kitchen desonide (DESOWEN) 0.05 % cream Apply topically at bedtime as needed and may repeat dose one time if needed.      . ezetimibe (ZETIA) 10 MG tablet Take 10 mg by mouth daily.        . insulin NPH-insulin regular (NOVOLIN 70/30) (70-30) 100 UNIT/ML injection Inject 76-78 Units into the skin 2 (two) times daily.       Marland Kitchen ipratropium-albuterol (DUONEB) 0.5-2.5 (3) MG/3ML SOLN Take 3 mLs by nebulization 4 (four) times daily as needed.        Marland Kitchen ketoconazole (NIZORAL) 2 % cream Apply topically daily.      Marland Kitchen loratadine (CLARITIN) 10 MG tablet Take 10 mg by mouth daily.      Marland Kitchen losartan (COZAAR) 100 MG tablet Take 50 mg by mouth daily.      . metFORMIN (GLUCOPHAGE) 1000 MG tablet Take 1,000 mg by mouth 2 (two) times daily.        . metoprolol (TOPROL-XL) 50 MG 24 hr tablet Take 50 mg by mouth 2 (two) times daily.        Marland Kitchen  Multiple Vitamin (MULTIVITAMIN) tablet Take 1 tablet by mouth daily.        Marland Kitchen oxyCODONE-acetaminophen (PERCOCET) 5-325 MG per tablet Take 1 tablet by mouth every 4 (four) hours as needed.        . predniSONE (DELTASONE) 5 MG tablet Take 5 mg by mouth daily.        No current facility-administered medications for this visit.    Past Medical History  Diagnosis Date  . Rheumatoid arthritis(714.0)     infliximab infusions every month   . COPD (chronic obstructive pulmonary disease)   . Elevated liver function tests   . Hypertension   . Diabetes mellitus     Past Surgical History  Procedure Laterality Date  . Coronary stent placement      s/p bare metal stent to the vein graft to the RCA 9/20. nml LV function   . Appendectomy    . Coronary artery bypass graft  1998  . Anterior cervical decompression      with posterior decompression and fusion      History   Social History  . Marital Status: Married    Spouse Name: N/A    Number of Children: N/A  . Years of Education: N/A   Occupational History  . Not on file.   Social History Main Topics  . Smoking status: Former Smoker -- 3.00 packs/day for 35 years    Types: Cigarettes    Quit date: 11/23/1996  . Smokeless tobacco: Current User    Types: Chew     Comment: started chewing in 2001/chews 2-3 packs per week  . Alcohol Use: No  . Drug Use: Not on file  . Sexual Activity: Not on file   Other Topics Concern  . Not on file   Social History Narrative   Married, retired.      Filed Vitals:   03/16/13 0827  BP: 136/72  Pulse: 76  Height: 5\' 11"  (1.803 m)  Weight: 296 lb (134.265 kg)  SpO2: 93%    PHYSICAL EXAM General: NAD  Neck: No JVD, no thyromegaly or thyroid nodule.  Lungs: Dry crackles at bases bilaterally with wheezes also heard, with diminished respiratory effort.  CV: Nondisplaced PMI. Heart regular with premature contractions appreciated, distant S1/S2, no S3/S4, II/VI harsh systolic murmur at RUSB. 1+ left peri-ankle edema. No carotid bruit. Normal pedal pulses.  Abdomen: Soft, nontender, no hepatosplenomegaly, no distention.  Neurologic: Alert and oriented x 3.  Psych: Normal affect.  Extremities: No clubbing or cyanosis.    ECG: reviewed and available in electronic records.  - Left ventricle: The cavity size was normal. There was mild concentric hypertrophy. Systolic function was normal. The estimated ejection fraction was in the range of 60% to 65%. Wall motion was normal; there were no regional wall motion abnormalities. There was an increased relative contribution of atrial contraction to ventricular filling. Doppler parameters are consistent with abnormal left ventricular relaxation (grade 1 diastolic dysfunction). - Aortic valve: Trileaflet; moderately thickened, moderately calcified leaflets. Valve mobility was restricted. There Was  moderate stenosis. Peak velocity: 375cm/s (S). Mean gradient: 35mm Hg (S). Valve area: 0.9cm^2(VTI). Valve area: 0.83cm^2 (Vmax). - Left atrium: The atrium was mildly dilated.      ASSESSMENT AND PLAN: 1. CAD: no anginal discomfort. He does have dyspnea on exertion which is likely related to both his COPD and potentially interstitial lung disease secondary to rheumatoid arthritis, along with his moderate aortic stenosis. I will repeat his echo in 6 months. Continues on  Plavix.  2. HTN: normotensive today.  3. Hyperlipidemia: on Zetia. Followed by PCP.  4. Paroxysmal atrial flutter: currently in sinus rhythm.  5. Dyspnea on exertion and murmur: I believe this is likely due to both COPD/interstitial lung disease due to rheumatoid arthritis, as well as moderate aortic stenosis. He does not require any intervention for his stenotic valve at present. I will repeat an echocardiogram in 6 months to evaluate for progression. I had a long discussion with both the patient and his wife with regards to treatment of symptomatic severe aortic stenosis, but we are not at that point yet (AVR vs TAVR). I will give him 40 mg of Lasix daily for 5 days and then check a BMET. I will also make a referral to a pulmonologist to further evaluate and treat his COPD and interstitial lung disease. He is presently on prednisone 5 mg daily I will give him a short prednisone burst of 20 mg for 3 days followed by 10 mg for 4 days and then back to his maintenance dose of 5 mg daily.  Dispo: f/u 6 months after echo  Prentice Docker, M.D., F.A.C.C.

## 2013-03-21 ENCOUNTER — Encounter: Payer: Self-pay | Admitting: Internal Medicine

## 2013-03-21 ENCOUNTER — Ambulatory Visit (INDEPENDENT_AMBULATORY_CARE_PROVIDER_SITE_OTHER): Payer: Medicare Other | Admitting: Internal Medicine

## 2013-03-21 VITALS — BP 116/64 | HR 70 | Temp 97.5°F | Ht 71.0 in | Wt 288.0 lb

## 2013-03-21 DIAGNOSIS — M069 Rheumatoid arthritis, unspecified: Secondary | ICD-10-CM

## 2013-03-21 DIAGNOSIS — R0600 Dyspnea, unspecified: Secondary | ICD-10-CM

## 2013-03-21 DIAGNOSIS — R0609 Other forms of dyspnea: Secondary | ICD-10-CM

## 2013-03-21 MED ORDER — FAMOTIDINE 20 MG PO TABS: ORAL_TABLET | ORAL | Status: DC

## 2013-03-21 MED ORDER — FORMOTEROL FUMARATE 20 MCG/2ML IN NEBU: 20.0000 ug | INHALATION_SOLUTION | Freq: Two times a day (BID) | RESPIRATORY_TRACT | Status: DC

## 2013-03-21 MED ORDER — PANTOPRAZOLE SODIUM 40 MG PO TBEC: 40.0000 mg | DELAYED_RELEASE_TABLET | Freq: Every day | ORAL | Status: DC

## 2013-03-21 NOTE — Progress Notes (Signed)
  Subjective:    Patient ID: [name], male    DOB: [birth year]   MRN: [id number]  HPI  76 yowm quit smoking in 1998 with dx of copd in 1996 with cough never completely resolved and then breathing much worse p CABG 1998 but even worse  Since start 2014 so referred 03/21/2013 to pulmonary clinic by [facility] in [place] for sob eval.  Baseline wt around 250 late 90s   03/21/2013 1st [facility] office visit/ [name] cc daily doe worse with humid weather assoc with cough/ hoarseness > min white mucus.  Sometimes sob at rest, freq wakening up wheezing and needs to get up and use neb  Steroid dep for RA x 2 years, better breathing with prednisone  advair made him worse.   No obvious patterns in  day to day or daytime variabilty or assoc cp or chest tightness, subjective wheeze overt sinus or hb symptoms. No unusual exp hx or h/o childhood pna/ asthma or knowledge of premature birth.  Sleeping ok without nocturnal  or early am exacerbation  of respiratory  c/o's or need for noct saba. Also denies any obvious fluctuation of symptoms with weather or environmental changes or other aggravating or alleviating factors except as outlined above   Current Medications, Allergies, Complete Past Medical History, Past Surgical History, Family History, and Social History were reviewed in [employer] record.            Review of Systems  Constitutional: Negative for fever, chills, activity change, appetite change and unexpected weight change.  HENT: Positive for congestion and dental problem. Negative for postnasal drip, rhinorrhea, sneezing, sore throat, trouble swallowing and voice change.   Eyes: Negative for visual disturbance.  Respiratory: Positive for cough and shortness of breath. Negative for choking.   Cardiovascular: Positive for leg swelling. Negative for chest pain.  Gastrointestinal: Negative for nausea, vomiting and abdominal pain.  Genitourinary: Negative for  difficulty urinating.  Musculoskeletal: Positive for arthralgias.  Skin: Negative for rash.  Psychiatric/Behavioral: Negative for behavioral problems and confusion.       Objective:   Physical Exam  Very hoarse amb wm barely get from chair to exam table with cane due to weak legs > sob  Wt Readings from Last 3 Encounters:  03/21/13 288 lb (130.636 kg)  03/16/13 296 lb (134.265 kg)  02/12/13 285 lb (129.275 kg)      HEENT mild turbinate edema.  Oropharynx no thrush or excess pnd or cobblestoning.  No JVD or cervical adenopathy. Mild accessory muscle hypertrophy. Trachea midline, nl thryroid. Chest was hyperinflated by percussion with diminished breath sounds and moderate increased exp time without wheeze. Hoover sign positive at mid inspiration. Regular rate and rhythm without murmur gallop or rub or increase P2 or edema.  Abd: no hsm, nl excursion. Ext warm without cyanosis or clubbing.     02/12/13 Basilar scarring     Assessment & Plan:

## 2013-03-21 NOTE — Patient Instructions (Signed)
Performist 20 mcg twice daily with with budesonide  Only use your albuterol/ipatropium as a rescue medication to be used if you can't catch your breath by resting or doing a relaxed purse lip breathing pattern.  - The less you use it, the better it will work when you need it. - Ok to use up to every 4 hours if you must but call for immediate appointment if use goes up over your usual need - Don't leave home without it !!  (think of it like your spare tire for your car)   Pantoprazole (protonix) 40 mg   Take 30-60 min before first meal of the day and Pepcid 20 mg one bedtime until return to office - this is the best way to tell whether stomach acid is contributing to your problem.    GERD (REFLUX)  is an extremely common cause of respiratory symptoms, many times with no significant heartburn at all.    It can be treated with medication, but also with lifestyle changes including avoidance of late meals, excessive alcohol, smoking cessation, and avoid fatty foods, chocolate, peppermint, colas, red wine, and acidic juices such as orange juice.  NO MINT OR MENTHOL PRODUCTS SO NO COUGH DROPS  USE SUGARLESS CANDY INSTEAD (jolley ranchers or Stover's)  NO OIL BASED VITAMINS - use powdered substitutes.  I will defer to Dr Dareen Piano to determine the ceiling and the floor for your prednisone.   Please schedule a follow up office visit in 4 weeks, sooner if needed with pft's

## 2013-03-23 DIAGNOSIS — R0600 Dyspnea, unspecified: Secondary | ICD-10-CM | POA: Insufficient documentation

## 2013-03-23 DIAGNOSIS — M069 Rheumatoid arthritis, unspecified: Secondary | ICD-10-CM | POA: Insufficient documentation

## 2013-03-23 NOTE — Assessment & Plan Note (Signed)
No evidence for sign RA lung dz at present, steroid dep longterm can adjust dose for joints but should not need to do so for lungs

## 2013-03-23 NOTE — Assessment & Plan Note (Signed)
-  03/21/2013  Walked RA  2 laps @ 185 ft each stopped due to  Weak legs and sob with sats still 92%   When respiratory symptoms begin or become refractory well after a patient reports complete smoking cessation,  Especially when this wasn't the case while they were smoking, a red flag is raised based on the work of Dr Primitivo Gauze which states:  if you quit smoking when your best day FEV1 is still well preserved it is highly unlikely you will progress to severe disease.  That is to say, once the smoking stops,  the symptoms should not suddenly erupt or markedly worsen.  If so, the differential diagnosis should include  obesity/deconditioning,  LPR/Reflux/Aspiration syndromes,  occult CHF, or  especially side effect of medications commonly used in this population.   For now try add perfomist to bud and change duoneb/combivent to backup until returns for pfts  ? Acid (or non-acid) GERD > always difficult to exclude as up to 75% of pts in some series report no assoc GI/ Heartburn symptoms> rec max (24h)  acid suppression and diet restrictions/ reviewed and instructions given in writting   Obesity/ deconditioning clearly a concern as well ? Rehab potential > discuss next ov   ? CHF > see echo 02/15/13  C/w AS:  Mean gradient: 35mm Hg (S). Valve area: 0.9cm^2(VTI) > f/u by cards serially planned

## 2013-04-24 ENCOUNTER — Other Ambulatory Visit: Payer: Self-pay | Admitting: Internal Medicine

## 2013-04-24 DIAGNOSIS — R0600 Dyspnea, unspecified: Secondary | ICD-10-CM

## 2013-04-30 ENCOUNTER — Ambulatory Visit (INDEPENDENT_AMBULATORY_CARE_PROVIDER_SITE_OTHER): Payer: Medicare Other | Admitting: Internal Medicine

## 2013-04-30 ENCOUNTER — Encounter: Payer: Self-pay | Admitting: Internal Medicine

## 2013-04-30 VITALS — BP 112/66 | HR 66 | Temp 97.7°F | Ht 71.0 in | Wt 291.0 lb

## 2013-04-30 DIAGNOSIS — J449 Chronic obstructive pulmonary disease, unspecified: Secondary | ICD-10-CM

## 2013-04-30 DIAGNOSIS — R0609 Other forms of dyspnea: Secondary | ICD-10-CM

## 2013-04-30 DIAGNOSIS — R0600 Dyspnea, unspecified: Secondary | ICD-10-CM

## 2013-04-30 LAB — PULMONARY FUNCTION TEST
DL/VA % pred: 74 %
DL/VA: 3.47 ml/min/mmHg/L
DLCO unc % pred: 59 %
FEF 25-75 Pre: 0.42 L/sec
FEF2575-%Change-Post: 126 %
FEF2575-%Pred-Post: 35 %
FEV1-%Change-Post: 25 %
FEV1-%Pred-Post: 44 %
FEV1-%Pred-Pre: 34 %
FEV1-Post: 1.51 L
FEV1-Pre: 1.2 L
FEV1FVC-%Change-Post: 0 %
FEV1FVC-%Pred-Pre: 70 %
FEV6-%Pred-Post: 61 %
FEV6-Pre: 2.12 L
FEV6FVC-%Change-Post: 0 %
FEV6FVC-%Pred-Pre: 98 %
FVC-Post: 2.91 L
Post FEV1/FVC ratio: 52 %
Post FEV6/FVC ratio: 94 %
Pre FEV6/FVC Ratio: 93 %
RV % pred: 152 %
RV: 3.79 L
TLC: 6.53 L

## 2013-04-30 NOTE — Progress Notes (Signed)
PFT done today. 

## 2013-04-30 NOTE — Patient Instructions (Signed)
You have a GOLD III severity copd with reversibility which performist helps  The next medication to try as add on if your breathing is really what is holding you back = LAMA  =  spiriva or  tudorza but these are both powders for now - new versions in aerosol form may be available after the first of the year if desired   You would be a very good candidate for rehab at [facility] hospital > we could refer you if Dr Garner Nash prefers

## 2013-04-30 NOTE — Progress Notes (Signed)
  Subjective:    Patient ID: [name], male    DOB: [birth year]   MRN: [id number]   Brief patient profile:  17 yowm quit smoking in 1998 with dx of copd in 1996 with cough never completely resolved and then breathing much worse p CABG 1998 but even worse  Since start 2014 so referred 03/21/2013 to pulmonary clinic by [facility] in [place] for sob eval.  Baseline wt around 250 late 90s   History of Present Illness  03/21/2013 1st [facility] office visit/ Wert cc daily doe worse with humid weather assoc with cough/ hoarseness > min white mucus.  Sometimes sob at rest, freq wakening up wheezing and needs to get up and use neb Steroid dep for RA x 2 years, better breathing with prednisone  advair made him worse.  rec Performist 20 mcg twice daily with with budesonide Pantoprazole (protonix) 40 mg   Take 30-60 min before first meal of the day and Pepcid 20 mg one bedtime until return to office - this is the best way to tell whether stomach acid is contributing to your problem.   GERD diet  I will defer to Dr Dareen Piano to determine the ceiling and the floor for your prednisone.> 5 mg x sev years   04/30/2013 f/u ov/Wert re: GOLD III copd with reversibility Chief Complaint  Patient presents with  . Followup with PFT    Pt states overall breathing has improved. He did have some SOB this am-relates to the damp weather. No new co's today.   using rescue every couple of days when overdoes it, recovers at rest but never tries to rechallenge himself p saba        Objective:   Physical Exam    amb wm more robust than prev ov   04/30/2013     291  Wt Readings from Last 3 Encounters:  03/21/13 288 lb (130.636 kg)  03/16/13 296 lb (134.265 kg)  02/12/13 285 lb (129.275 kg)      HEENT mild turbinate edema.  Oropharynx no thrush or excess pnd or cobblestoning.  No JVD or cervical adenopathy. Mild accessory muscle hypertrophy. Trachea midline, nl thryroid. Chest was hyperinflated by  percussion with diminished breath sounds and moderate increased exp time without wheeze. Hoover sign positive at mid inspiration. Regular rate and rhythm without murmur gallop or rub or increase P2 or edema.  Abd: no hsm, nl excursion. Ext warm without cyanosis or clubbing.     02/12/13 Basilar scarring     Assessment & Plan:

## 2013-05-01 ENCOUNTER — Telehealth: Payer: Self-pay | Admitting: *Deleted

## 2013-05-01 NOTE — Telephone Encounter (Signed)
Message copied by Lesle Chris on Tue May 01, 2013 10:23 AM ------      Message from: Lesle Chris      Created: Fri Mar 16, 2013  9:14 AM      Regarding: KO FU        BMET - 11/20 ------

## 2013-05-01 NOTE — Telephone Encounter (Signed)
Note faxed to Dr. Rosann Auerbach office requesting most recent labs.

## 2013-05-01 NOTE — Telephone Encounter (Signed)
Spoke with wife Clydie Braun) - stated that he had labs done at [facility]) office like we requested.

## 2013-05-02 NOTE — Assessment & Plan Note (Signed)
-   PFT's 04/30/2013 FEV1  1.51 (44%) p B2 (which was a 25% improvement) and ratio 52 with dlco 59 corrects to 74%  I had an extended summary  discussion with the patient today lasting 15 to 20 minutes of a 25 minute visit on the following issues:  pft's reviewed looking at the cup half full in that he clearly does have a reversible component  He clearly is better now on ICS/LABA  and now more limited by knees not sob so would leave well enough alone but if does become more limited by sob best option would be lama like spiriva or turdorza as add on.  Since maintaining on pred by rheum one other option to consolidate rx is just to use anoro daily  Advised him that regardless what we pick as a "Plan A" we use the amt of "Plan B" use (saba use ) to determine whether a change in Plan A needed.  For now he doing fine so no change rx needed    Each maintenance medication was reviewed in detail including most importantly the difference between maintenance and as needed and under what circumstances the prns are to be used.  Please see instructions for details which were reviewed in writing and the patient given a copy.

## 2013-05-14 NOTE — Telephone Encounter (Signed)
Labs received & routed to Dr. Purvis Sheffield for review.

## 2013-06-06 ENCOUNTER — Other Ambulatory Visit: Payer: Self-pay | Admitting: Cardiovascular Disease

## 2013-08-15 ENCOUNTER — Other Ambulatory Visit: Payer: Self-pay | Admitting: *Deleted

## 2013-08-15 DIAGNOSIS — I2510 Atherosclerotic heart disease of native coronary artery without angina pectoris: Secondary | ICD-10-CM

## 2013-08-15 DIAGNOSIS — I350 Nonrheumatic aortic (valve) stenosis: Secondary | ICD-10-CM

## 2013-09-14 ENCOUNTER — Ambulatory Visit (HOSPITAL_COMMUNITY)
Admission: RE | Admit: 2013-09-14 | Discharge: 2013-09-14 | Disposition: A | Discharge status: Home / Self Care | Payer: Medicare Other | Source: Ambulatory Visit | Attending: Cardiovascular Disease | Admitting: Cardiovascular Disease

## 2013-09-14 DIAGNOSIS — J449 Chronic obstructive pulmonary disease, unspecified: Secondary | ICD-10-CM | POA: Insufficient documentation

## 2013-09-14 DIAGNOSIS — I359 Nonrheumatic aortic valve disorder, unspecified: Secondary | ICD-10-CM

## 2013-09-14 DIAGNOSIS — J4489 Other specified chronic obstructive pulmonary disease: Secondary | ICD-10-CM | POA: Insufficient documentation

## 2013-09-14 DIAGNOSIS — E785 Hyperlipidemia, unspecified: Secondary | ICD-10-CM | POA: Insufficient documentation

## 2013-09-14 DIAGNOSIS — Z87891 Personal history of nicotine dependence: Secondary | ICD-10-CM | POA: Insufficient documentation

## 2013-09-14 DIAGNOSIS — I10 Essential (primary) hypertension: Secondary | ICD-10-CM | POA: Insufficient documentation

## 2013-09-14 DIAGNOSIS — I350 Nonrheumatic aortic (valve) stenosis: Secondary | ICD-10-CM

## 2013-09-14 DIAGNOSIS — I2510 Atherosclerotic heart disease of native coronary artery without angina pectoris: Secondary | ICD-10-CM

## 2013-09-14 DIAGNOSIS — Z6837 Body mass index (BMI) 37.0-37.9, adult: Secondary | ICD-10-CM | POA: Insufficient documentation

## 2013-09-14 NOTE — Progress Notes (Signed)
*  PRELIMINARY RESULTS* Echocardiogram 2D Echocardiogram has been performed.  Katheren Puller 09/14/2013, 12:19 PM

## 2013-09-25 ENCOUNTER — Encounter: Payer: Self-pay | Admitting: Cardiovascular Disease

## 2013-09-25 ENCOUNTER — Ambulatory Visit (INDEPENDENT_AMBULATORY_CARE_PROVIDER_SITE_OTHER): Payer: Medicare Other | Admitting: Cardiovascular Disease

## 2013-09-25 VITALS — BP 114/60 | HR 105 | Ht 71.0 in | Wt 295.0 lb

## 2013-09-25 DIAGNOSIS — R600 Localized edema: Secondary | ICD-10-CM

## 2013-09-25 DIAGNOSIS — J849 Interstitial pulmonary disease, unspecified: Secondary | ICD-10-CM

## 2013-09-25 DIAGNOSIS — R0609 Other forms of dyspnea: Secondary | ICD-10-CM

## 2013-09-25 DIAGNOSIS — I4892 Unspecified atrial flutter: Secondary | ICD-10-CM

## 2013-09-25 DIAGNOSIS — I499 Cardiac arrhythmia, unspecified: Secondary | ICD-10-CM

## 2013-09-25 DIAGNOSIS — I359 Nonrheumatic aortic valve disorder, unspecified: Secondary | ICD-10-CM

## 2013-09-25 DIAGNOSIS — E785 Hyperlipidemia, unspecified: Secondary | ICD-10-CM

## 2013-09-25 DIAGNOSIS — Z79899 Other long term (current) drug therapy: Secondary | ICD-10-CM

## 2013-09-25 DIAGNOSIS — I10 Essential (primary) hypertension: Secondary | ICD-10-CM

## 2013-09-25 DIAGNOSIS — J8410 Pulmonary fibrosis, unspecified: Secondary | ICD-10-CM

## 2013-09-25 DIAGNOSIS — M069 Rheumatoid arthritis, unspecified: Secondary | ICD-10-CM

## 2013-09-25 DIAGNOSIS — J449 Chronic obstructive pulmonary disease, unspecified: Secondary | ICD-10-CM

## 2013-09-25 DIAGNOSIS — R0989 Other specified symptoms and signs involving the circulatory and respiratory systems: Secondary | ICD-10-CM

## 2013-09-25 DIAGNOSIS — I25810 Atherosclerosis of coronary artery bypass graft(s) without angina pectoris: Secondary | ICD-10-CM

## 2013-09-25 DIAGNOSIS — I350 Nonrheumatic aortic (valve) stenosis: Secondary | ICD-10-CM

## 2013-09-25 DIAGNOSIS — R609 Edema, unspecified: Secondary | ICD-10-CM

## 2013-09-25 MED ORDER — FUROSEMIDE 40 MG PO TABS: 40.0000 mg | ORAL_TABLET | Freq: Every day | ORAL | Status: DC

## 2013-09-25 MED ORDER — DILTIAZEM HCL 30 MG PO TABS: 30.0000 mg | ORAL_TABLET | Freq: Two times a day (BID) | ORAL | Status: DC

## 2013-09-25 MED ORDER — POTASSIUM CHLORIDE CRYS ER 20 MEQ PO TBCR: 20.0000 meq | EXTENDED_RELEASE_TABLET | Freq: Every day | ORAL | Status: DC

## 2013-09-25 NOTE — Progress Notes (Addendum)
Patient ID: [name], male   DOB: Mar 04, 1946, 68 y.o.   MRN: [id number]      SUBJECTIVE: [name] has a h/o CABG with stent to a vein graft, HTN, dyslipidemia, paroxysmal atrial flutter, diabetes, COPD, aortic stenosis and rheumatoid arthritis. Last cardiac cath in 2010. Since his last visit, he feels that his shortness of breath is about the same if not slightly worse. He denies chest pain. He very seldom has dizziness and denies syncope altogether. He believes he had pneumonia approximately 6 weeks ago. The elevated pollen counts have been bothering his breathing as well. He denies palpitations. He has had significant left lower extremity swelling out of proportion to his right leg, in which the vein was harvested from.  ECG today shows atrial flutter with variable AV block, HR 106 bpm.    No Known Allergies  Current Outpatient Prescriptions  Medication Sig Dispense Refill  . albuterol-ipratropium (COMBIVENT) 18-103 MCG/ACT inhaler Inhale 2 puffs into the lungs every 6 (six) hours as needed.        Marland Kitchen ascorbic acid (VITAMIN C) 500 MG tablet Take 500 mg by mouth daily.       . BUDESONIDE IN 1 vial in neb twice daily  Pt unsure about strength      . CETIRIZINE HCL PO Take 10 mg by mouth daily.        . clopidogrel (PLAVIX) 75 MG tablet Take 75 mg by mouth daily.        Marland Kitchen co-enzyme Q-10 30 MG capsule Take 30 mg by mouth daily.        Marland Kitchen desonide (DESOWEN) 0.05 % cream Apply topically at bedtime as needed and may repeat dose one time if needed.      . ezetimibe (ZETIA) 10 MG tablet Take 10 mg by mouth daily.        . famotidine (PEPCID) 20 MG tablet One at bedtime  30 tablet  2  . formoterol (PERFOROMIST) 20 MCG/2ML nebulizer solution Take 2 mLs (20 mcg total) by nebulization 2 (two) times daily. Use in nebulizer twice daily perfectly regularly  120 mL  11  . furosemide (LASIX) 40 MG tablet TAKE AS DIRECTED BY MD  30 tablet  3  . insulin NPH-insulin regular (NOVOLIN 70/30) (70-30)  100 UNIT/ML injection Inject 76-78 Units into the skin 2 (two) times daily.       Marland Kitchen ipratropium-albuterol (DUONEB) 0.5-2.5 (3) MG/3ML SOLN Take 3 mLs by nebulization 4 (four) times daily as needed.        Marland Kitchen ketoconazole (NIZORAL) 2 % cream Apply topically daily.      Marland Kitchen loratadine (CLARITIN) 10 MG tablet Take 10 mg by mouth daily.      Marland Kitchen losartan (COZAAR) 100 MG tablet Take 50 mg by mouth daily.      . metFORMIN (GLUCOPHAGE) 1000 MG tablet Take 1,000 mg by mouth 2 (two) times daily.        . metoprolol (TOPROL-XL) 50 MG 24 hr tablet Take 50 mg by mouth 2 (two) times daily.        . Multiple Vitamin (MULTIVITAMIN) tablet Take 1 tablet by mouth daily.        Marland Kitchen oxyCODONE-acetaminophen (PERCOCET) 5-325 MG per tablet Take 1 tablet by mouth every 4 (four) hours as needed.        . pantoprazole (PROTONIX) 40 MG tablet Take 1 tablet (40 mg total) by mouth daily. Take 30-60 min before first meal of the day  30 tablet  2  . predniSONE (DELTASONE) 5 MG tablet Take 5 mg by mouth daily.        No current facility-administered medications for this visit.    Past Medical History  Diagnosis Date  . Rheumatoid arthritis(714.0)     infliximab infusions every month   . COPD (chronic obstructive pulmonary disease)   . Elevated liver function tests   . Hypertension   . Diabetes mellitus     Past Surgical History  Procedure Laterality Date  . Coronary stent placement      s/p bare metal stent to the vein graft to the RCA 9/20. nml LV function   . Appendectomy    . Coronary artery bypass graft  1998  . Anterior cervical decompression      with posterior decompression and fusion     History   Social History  . Marital Status: Married    Spouse Name: N/A    Number of Children: N/A  . Years of Education: N/A   Occupational History  . Retired Special educational needs teacher    Social History Main Topics  . Smoking status: Former Smoker -- 3.00 packs/day for 35 years    Types: Cigarettes    Quit date: 11/23/1996  .  Smokeless tobacco: Current User    Types: Chew     Comment: started chewing in 2001/chews 2-3 packs per week  . Alcohol Use: No  . Drug Use: Not on file  . Sexual Activity: Not on file   Other Topics Concern  . Not on file   Social History Narrative   Married, retired.      Filed Vitals:   09/25/13 1307  Height: 5\' 11"  (1.803 m)  Weight: 295 lb (133.811 kg)   BP 114/60  Pulse 105   PHYSICAL EXAM General: NAD  Neck: No JVD, no thyromegaly or thyroid nodule.  Lungs: Dry crackles at bases bilaterally without wheezes, with overall diminished sounds throughout. No rales. CV: Nondisplaced PMI. Heart regular with premature contractions appreciated, distant S1/diminished S2, no S3/S4, II/VI harsh systolic murmur at RUSB. 1-2+ left periankle and pretibial, trace right peri-ankle edema.. No carotid bruit. Abdomen: Soft, nontender, no hepatosplenomegaly, no distention.  Neurologic: Alert and oriented x 3.  Psych: Normal affect.  Extremities: No clubbing or cyanosis.   ECG: reviewed and available in electronic records.  ECHO (09-14-2013): - Left ventricle: The cavity size was normal. Wall thickness was increased in a pattern of mild LVH. Systolic function was normal. The estimated ejection fraction was in the range of 60% to 65%. Cannot exclude hypokinesis of the basalinferior myocardium. The study is not technically sufficient to allow evaluation of LV diastolic function. - Aortic valve: Moderately calcified annulus. Probably trileaflet; severely calcified leaflets. Cusp separation was reduced. There was severe stenosis. No significant regurgitation. Mean gradient: 66mm Hg (S). Peak gradient: 60mm Hg (S). Valve area: 0.97cm^2(VTI). Peak velocity ratio of LVOT to aortic valve: 0.28. Valve area: 0.88cm^2 (Vmax). - Mitral valve: Calcified annulus. Trivial regurgitation. - Left atrium: The atrium was mildly dilated. - Right atrium: The atrium was mildly dilated. Central venous  pressure: 53mm Hg (est). - Tricuspid valve: Trivial regurgitation. - Pulmonary arteries: Systolic pressure could not be accurately estimated. - Pericardium, extracardiac: There was no pericardial effusion. Impressions:  - Mild LVH with LVEF 60-65%, cannot exclude basal inferior hypokinesis. Indeterminate diastolic function in the setting of atrial arrhythmia. Mild biatrial enlargement. MAC with trivial mitral regurgitation. Severe calcific aortic stenosis as outlined above. Gradients have increased somewhat compared to  prior study. Unable to assess PASP.     ASSESSMENT AND PLAN: 1. CAD: No anginal discomfort. He does have dyspnea on exertion which is likely related to both his COPD and potentially interstitial lung disease secondary to rheumatoid arthritis, along with severe aortic stenosis. Continues on Plavix, metoprolol, and Zetia.  2. HTN: Normotensive today on current therapy. No changes are indicated. 3. Hyperlipidemia: On Zetia. Followed by PCP. Intolerant to Lipitor. 4. Paroxysmal atrial flutter: HR is elevated today and may be contributing to leg edema with respect to diastolic dysfunction. I am not inclined to increase the dose of metoprolol given his underlying COPD. I will start diltiazem 30 mg bid. 5. Severe aortic stenosis: I had a long discussion with both the patient and his wife with regards to treatment of symptomatic severe aortic stenosis. With respect to his symptoms, he has had increased left lower extremity swelling but no chest pain. His dyspnea on exertion is stable if not slightly worse, but making this assessment difficult is his significant COPD. He denies significant dizziness and syncope altogether. I will make a referral to Dr. Tonny Bollman in our valve clinic for a comprehensive evaluation with respect to his management options. 6. COPD: Now followed by Dr. Sherene Sires. Appears to be stable. 7. Leg edema: There is no palpable cord or tenderness/inflammation to  suggest DVT. I will prescribe Lasix 40 mg daily along with KCl 20 meq daily and check a BMET on 5/29.  Dispo: f/u 3 months. Arrange for appt with Dr. Tonny Bollman.   Prentice Docker, M.D., F.A.C.C.

## 2013-09-25 NOTE — Patient Instructions (Signed)
   Change Lasix to 40mg  daily  Begin Potassium daily  Begin Diltiazem (SA) 30mg  twice a day    All prescriptions above sent to pharmacy Continue all other medications.   Lab for BMET - due this Friday Office will contact with results via phone or letter.   Referral to Dr. - valve clinic [facility] office) Follow up in  3 months

## 2013-09-25 NOTE — Addendum Note (Signed)
Addended by: Lesle Chris on: 09/25/2013 01:58 PM   Modules accepted: Orders, Medications

## 2013-10-02 ENCOUNTER — Encounter: Payer: Self-pay | Admitting: *Deleted

## 2013-10-09 ENCOUNTER — Other Ambulatory Visit: Payer: Self-pay | Admitting: *Deleted

## 2013-10-09 MED ORDER — POTASSIUM CHLORIDE CRYS ER 20 MEQ PO TBCR: 20.0000 meq | EXTENDED_RELEASE_TABLET | Freq: Every day | ORAL | Status: DC

## 2013-10-09 MED ORDER — DILTIAZEM HCL 30 MG PO TABS: 30.0000 mg | ORAL_TABLET | Freq: Two times a day (BID) | ORAL | Status: DC

## 2013-10-09 MED ORDER — FUROSEMIDE 40 MG PO TABS: 40.0000 mg | ORAL_TABLET | Freq: Every day | ORAL | Status: DC

## 2013-10-30 ENCOUNTER — Encounter: Payer: Self-pay | Admitting: Cardiovascular Disease

## 2013-10-30 ENCOUNTER — Ambulatory Visit (INDEPENDENT_AMBULATORY_CARE_PROVIDER_SITE_OTHER): Payer: Medicare Other | Admitting: Cardiovascular Disease

## 2013-10-30 VITALS — BP 118/67 | HR 93 | Ht 71.0 in | Wt 291.8 lb

## 2013-10-30 DIAGNOSIS — I359 Nonrheumatic aortic valve disorder, unspecified: Secondary | ICD-10-CM

## 2013-10-30 DIAGNOSIS — I350 Nonrheumatic aortic (valve) stenosis: Secondary | ICD-10-CM

## 2013-10-30 DIAGNOSIS — I25810 Atherosclerosis of coronary artery bypass graft(s) without angina pectoris: Secondary | ICD-10-CM

## 2013-10-30 NOTE — Patient Instructions (Signed)
TAVR discussed with the patient and educational information provided. The patient will call back if interested in proceeding with further evaluation and treatment of Aortic Stenosis.

## 2013-10-31 ENCOUNTER — Encounter: Payer: Self-pay | Admitting: Cardiovascular Disease

## 2013-10-31 NOTE — Progress Notes (Signed)
HPI:   68 year old gentleman referred for consideration of TAVR. The patient has been diagnosed with severe aortic stenosis. He has an extensive cardiac history with remote CABG in 1998. He then underwent PCI of the saphenous vein graft to right coronary artery in 2012. He has multiple other comorbid conditions including obesity, atrial flutter, severe COPD, diabetes, rheumatoid arthritis, and hypertension. The patient lives in he with his wife in [place], [place].  He was seen last month by Dr Purvis Sheffield and noted to have progressive aortic stenosis by echocardiography. His symptoms of chronic shortness of breath have worsened. Considering these changes, he was referred for review of potential treatment options.  The patient has long-standing dyspnea. He also has a chronic cough. He is on nocturnal oxygen and also uses oxygen during the day at times. He denies chest pain or pressure. He denies orthopnea or PND. He's had no lightheadedness or syncope. He is primarily limited by shortness of breath, but also has significant back and leg problems. His low back pain radiates into his left leg.  The patient is retired from [employer]. He was a smoker until the time of his bypass surgery in 1998. He does not drink alcohol. He's gained about 30 pounds in in the last 3-4 years, primarily related to sedentary lifestyle.  Outpatient Encounter Prescriptions as of 10/30/2013  Medication Sig  . albuterol-ipratropium (COMBIVENT) 18-103 MCG/ACT inhaler Inhale 2 puffs into the lungs every 6 (six) hours as needed.    Marland Kitchen ascorbic acid (VITAMIN C) 500 MG tablet Take 500 mg by mouth daily.   . BUDESONIDE IN 1 vial in neb twice daily  Pt unsure about strength  . CETIRIZINE HCL PO Take 10 mg by mouth daily.    . clopidogrel (PLAVIX) 75 MG tablet Take 75 mg by mouth daily.    Marland Kitchen co-enzyme Q-10 30 MG capsule Take 30 mg by mouth daily.    Marland Kitchen desonide (DESOWEN) 0.05 % cream Apply topically at bedtime as needed and may repeat  dose one time if needed.  . diltiazem (CARDIZEM) 30 MG tablet Take 1 tablet (30 mg total) by mouth 2 (two) times daily.  Marland Kitchen ezetimibe (ZETIA) 10 MG tablet Take 10 mg by mouth daily.    . famotidine (PEPCID) 20 MG tablet One at bedtime  . formoterol (PERFOROMIST) 20 MCG/2ML nebulizer solution Take 2 mLs (20 mcg total) by nebulization 2 (two) times daily. Use in nebulizer twice daily perfectly regularly  . furosemide (LASIX) 40 MG tablet Take 1 tablet (40 mg total) by mouth daily.  . insulin NPH-insulin regular (NOVOLIN 70/30) (70-30) 100 UNIT/ML injection Inject 76-78 Units into the skin 2 (two) times daily.   Marland Kitchen ipratropium-albuterol (DUONEB) 0.5-2.5 (3) MG/3ML SOLN Take 3 mLs by nebulization 4 (four) times daily as needed.    Marland Kitchen ketoconazole (NIZORAL) 2 % cream Apply topically daily.  Marland Kitchen loratadine (CLARITIN) 10 MG tablet Take 10 mg by mouth daily.  Marland Kitchen losartan (COZAAR) 100 MG tablet Take 50 mg by mouth daily.  . metFORMIN (GLUCOPHAGE) 1000 MG tablet Take 1,000 mg by mouth 2 (two) times daily.    . metoprolol (TOPROL-XL) 50 MG 24 hr tablet Take 50 mg by mouth 2 (two) times daily.    . Multiple Vitamin (MULTIVITAMIN) tablet Take 1 tablet by mouth daily.    Marland Kitchen oxyCODONE-acetaminophen (PERCOCET) 5-325 MG per tablet Take 1 tablet by mouth every 4 (four) hours as needed.    . pantoprazole (PROTONIX) 40 MG tablet Take 1 tablet (40  mg total) by mouth daily. Take 30-60 min before first meal of the day  . potassium chloride SA (KLOR-CON M20) 20 MEQ tablet Take 1 tablet (20 mEq total) by mouth daily.  . predniSONE (DELTASONE) 5 MG tablet Take 5 mg by mouth daily.     Review of patient's allergies indicates no known allergies.  Past Medical History  Diagnosis Date  . Rheumatoid arthritis(714.0)     infliximab infusions every month   . COPD (chronic obstructive pulmonary disease)   . Elevated liver function tests   . Hypertension   . Diabetes mellitus     Past Surgical History  Procedure Laterality  Date  . Coronary stent placement      s/p bare metal stent to the vein graft to the RCA 9/20. nml LV function   . Appendectomy    . Coronary artery bypass graft  1998  . Anterior cervical decompression      with posterior decompression and fusion     History   Social History  . Marital Status: Married    Spouse Name: N/A    Number of Children: N/A  . Years of Education: N/A   Occupational History  . Retired Special educational needs teacher    Social History Main Topics  . Smoking status: Former Smoker -- 3.00 packs/day for 35 years    Types: Cigarettes    Quit date: 11/23/1996  . Smokeless tobacco: Current User    Types: Chew     Comment: started chewing in 2001/chews 2-3 packs per week  . Alcohol Use: No  . Drug Use: Not on file  . Sexual Activity: Not on file   Other Topics Concern  . Not on file   Social History Narrative   Married, retired.     Family History  Problem Relation Age of Onset  . Hypertension Mother   . Cancer Father   . Stroke Brother   . Liver cancer Paternal Grandfather   . Rheum arthritis Maternal Aunt   . Asthma Child   . Emphysema Brother     smoked    ROS:  General: no fevers/chills/night sweats Eyes: no blurry vision, diplopia, or amaurosis ENT: no sore throat or hearing loss Resp: Positive for cough, shortness of breath CV: Positive for leg swelling, otherwise as per history of present illness GI: no abdominal pain, nausea, vomiting, diarrhea, or constipation GU: no dysuria, frequency, or hematuria Skin: no rash Neuro: no headache, numbness, tingling, or weakness of extremities Musculoskeletal: Positive for low back pain Heme: no bleeding, DVT, or easy bruising Endo: no polydipsia or polyuria  BP 118/67  Pulse 93  Ht 5\' 11"  (1.803 m)  Wt 132.36 kg (291 lb 12.8 oz)  BMI 40.72 kg/m2  PHYSICAL EXAM: Pt is alert and oriented, pleasant morbidly obese male, in no distress. HEENT: normal Neck: JVP normal. Carotid upstrokes normal with bilateral  bruits. No thyromegaly. Lungs: equal expansion, clear bilaterally CV: The cardiac apex is nonpalpable. Heart is irregular with a grade 3/6 mid peaking systolic crescendo decrescendo murmur at the left sternal border Abd: soft, NT, +BS, no bruit, obese Back: no CVA tenderness Ext: Trace pretibial edema bilaterally        DP/PT pulses intact and = Skin: warm and dry without rash Neuro: CNII-XII intact             Strength intact = bilaterally  EKG:  Atrial flutter with variable AV block, right bundle branch block.  2-D echocardiogram 09/14/2013: Study Conclusions  - Left  ventricle: The cavity size was normal. Wall thickness was increased in a pattern of mild LVH. Systolic function was normal. The estimated ejection fraction was in the range of 60% to 65%. Cannot exclude hypokinesis of the basalinferior myocardium. The study is not technically sufficient to allow evaluation of LV diastolic function. - Aortic valve: Moderately calcified annulus. Probably trileaflet; severely calcified leaflets. Cusp separation was reduced. There was severe stenosis. No significant regurgitation. Mean gradient: 41mm Hg (S). Peak gradient: 54mm Hg (S). Valve area: 0.97cm^2(VTI). Peak velocity ratio of LVOT to aortic valve: 0.28. Valve area: 0.88cm^2 (Vmax). - Mitral valve: Calcified annulus. Trivial regurgitation. - Left atrium: The atrium was mildly dilated. - Right atrium: The atrium was mildly dilated. Central venous pressure: 3mm Hg (est). - Tricuspid valve: Trivial regurgitation. - Pulmonary arteries: Systolic pressure could not be accurately estimated. - Pericardium, extracardiac: There was no pericardial effusion. Impressions:  - Mild LVH with LVEF 60-65%, cannot exclude basal inferior hypokinesis. Indeterminate diastolic function in the setting of atrial arrhythmia. Mild biatrial enlargement. MAC with trivial mitral regurgitation. Severe calcific aortic stenosis as outlined above.  Gradients have increased somewhat compared to prior study. Unable to assess PASP.  ASSESSMENT AND PLAN: 68 year old gentleman with severe aortic stenosis. Multiple comorbid medical conditions outlined above include previous CABG with known vein graft disease dating back to his last catheterization in 2010, gold stage III COPD on nocturnal oxygen, morbid obesity, insulin requiring type 2 diabetes, poor functional status with chronic low back pain, chronic prednisone, and atrial flutter.  The patient is here with his wife today we have had extensive discussion regarding the natural history and treatment options of severe symptomatic aortic stenosis. He understands the poor associated prognosis. Potential treatment options include palliative medical therapy, transcatheter aortic valve replacement, or surgical aortic valve replacement with redo sternotomy. The next step in his evaluation would be a right and left heart catheterization for assessment of his coronary and bypass graft status and further evaluation of his aortic stenosis. The patient is not sure he wants to proceed with further evaluation and/or treatment at this time. He would like some time to think about this further and discuss again with Dr. Purvis SheffieldKoneswaran when he sees him back as scheduled in September. I will not schedule him back for followup at this time, but would be happy to see him at anytime in the future. If he decides to proceed with cardiac catheterization, he will call me or Dr Purvis SheffieldKoneswaran and we will arrange. He was given literature on TAVR today and all of his questions were answered.   Greater than 75 minutes spent in consultation, over half of that time in direct discussion with the patient regarding issues above.   Tonny BollmanMichael Tannisha Kennington 10/31/2013 10:45 PM

## 2013-11-08 ENCOUNTER — Telehealth: Payer: Self-pay | Admitting: *Deleted

## 2013-11-08 MED ORDER — RIVAROXABAN 20 MG PO TABS: 20.0000 mg | ORAL_TABLET | Freq: Every day | ORAL | Status: DC

## 2013-11-08 NOTE — Telephone Encounter (Signed)
1 month follow up scheduled with Misty Stanley for anticoagulant management.  Next OV with Dr. Purvis Sheffield is September 2015.

## 2013-11-08 NOTE — Telephone Encounter (Signed)
Per Dr. Purvis Sheffield - patient needs to be started on Xarelto 20mg  daily with evening meal for atrial flutter.  Patient had previous OV with Dr. & issues were discussed with Dr. Excell Seltzer.    Wife Purvis Sheffield) notified.  Will supply samples (#15 tabs given) & send new rx to [facility].

## 2013-12-03 ENCOUNTER — Other Ambulatory Visit: Payer: Self-pay | Admitting: Internal Medicine

## 2013-12-05 ENCOUNTER — Other Ambulatory Visit: Payer: Self-pay

## 2013-12-05 MED ORDER — RIVAROXABAN 20 MG PO TABS: 20.0000 mg | ORAL_TABLET | Freq: Every day | ORAL | Status: DC

## 2013-12-11 ENCOUNTER — Ambulatory Visit (INDEPENDENT_AMBULATORY_CARE_PROVIDER_SITE_OTHER): Payer: Medicare Other | Admitting: *Deleted

## 2013-12-11 DIAGNOSIS — I4892 Unspecified atrial flutter: Secondary | ICD-10-CM

## 2013-12-11 NOTE — Patient Instructions (Addendum)
Pt was started on Xarelto 20mg  qd for atrial flutter on 11/08/13 by Dr 01/09/14.    Reviewed patients medication list.  Pt is not currently on any combined P-gp and strong CYP3A4 inhibitors/inducers (ketoconazole, traconazole, ritonavir, carbamazepine, phenytoin, rifampin, St. John's wort).  Reviewed labs 12/11/13.  SCr 0.97, Weight 291, CrCl 134.19.  Dose appropriate based on CrCl.   12/11/13 Hgb and HCT  13.3/39.8  A full discussion of the nature of anticoagulants has been carried out.  A benefit/risk analysis has been presented to the patient, so that they understand the justification for choosing anticoagulation with Xarelto at this time.  The need for compliance is stressed.  Pt is aware to take the medication once daily with the largest meal of the day.  Side effects of potential bleeding are discussed, including unusual colored urine or stools, coughing up blood or coffee ground emesis, nose bleeds or serious fall or head trauma.  Discussed signs and symptoms of stroke. The patient should avoid any OTC items containing aspirin or ibuprofen.  Avoid alcohol consumption.   Call if any signs of abnormal bleeding.  Discussed financial obligations and resolved any difficulty in obtaining medication.  Next lab test test in 6 months.

## 2013-12-14 ENCOUNTER — Telehealth: Payer: Self-pay | Admitting: *Deleted

## 2013-12-14 ENCOUNTER — Telehealth: Payer: Self-pay | Admitting: Cardiovascular Disease

## 2013-12-14 NOTE — Telephone Encounter (Signed)
Patient informed and copy sent to Dr. Rosann Auerbach office.

## 2013-12-14 NOTE — Telephone Encounter (Signed)
Message copied by Eustace Moore on Fri Dec 14, 2013  2:15 PM ------      Message from: Prentice Docker A      Created: Wed Dec 12, 2013  4:08 PM       Good renal function. Platelets low. Should f/u with PCP with regards to thrombocytopenia. ------

## 2013-12-14 NOTE — Telephone Encounter (Signed)
Wife called and informed to have dentist office contact us directly about this matter.

## 2013-12-14 NOTE — Telephone Encounter (Signed)
Patient's wife called about upcoming dental work needed.  Stated that he dentist told her to contact us in reference to if he should come off any medication or start.

## 2014-01-01 DIAGNOSIS — Z9289 Personal history of other medical treatment: Secondary | ICD-10-CM

## 2014-01-01 HISTORY — DX: Personal history of other medical treatment: Z92.89

## 2014-01-03 ENCOUNTER — Ambulatory Visit (INDEPENDENT_AMBULATORY_CARE_PROVIDER_SITE_OTHER): Payer: Medicare Other | Admitting: Cardiovascular Disease

## 2014-01-03 ENCOUNTER — Encounter: Payer: Self-pay | Admitting: Cardiovascular Disease

## 2014-01-03 VITALS — BP 107/73 | HR 76 | Ht 71.0 in | Wt 286.0 lb

## 2014-01-03 DIAGNOSIS — I4892 Unspecified atrial flutter: Secondary | ICD-10-CM

## 2014-01-03 DIAGNOSIS — R600 Localized edema: Secondary | ICD-10-CM

## 2014-01-03 DIAGNOSIS — R0989 Other specified symptoms and signs involving the circulatory and respiratory systems: Secondary | ICD-10-CM

## 2014-01-03 DIAGNOSIS — Z794 Long term (current) use of insulin: Secondary | ICD-10-CM

## 2014-01-03 DIAGNOSIS — IMO0001 Reserved for inherently not codable concepts without codable children: Secondary | ICD-10-CM

## 2014-01-03 DIAGNOSIS — J8410 Pulmonary fibrosis, unspecified: Secondary | ICD-10-CM

## 2014-01-03 DIAGNOSIS — Z862 Personal history of diseases of the blood and blood-forming organs and certain disorders involving the immune mechanism: Secondary | ICD-10-CM

## 2014-01-03 DIAGNOSIS — I25810 Atherosclerosis of coronary artery bypass graft(s) without angina pectoris: Secondary | ICD-10-CM

## 2014-01-03 DIAGNOSIS — M069 Rheumatoid arthritis, unspecified: Secondary | ICD-10-CM

## 2014-01-03 DIAGNOSIS — J849 Interstitial pulmonary disease, unspecified: Secondary | ICD-10-CM

## 2014-01-03 DIAGNOSIS — E785 Hyperlipidemia, unspecified: Secondary | ICD-10-CM

## 2014-01-03 DIAGNOSIS — I350 Nonrheumatic aortic (valve) stenosis: Secondary | ICD-10-CM

## 2014-01-03 DIAGNOSIS — I10 Essential (primary) hypertension: Secondary | ICD-10-CM

## 2014-01-03 DIAGNOSIS — R609 Edema, unspecified: Secondary | ICD-10-CM

## 2014-01-03 DIAGNOSIS — I359 Nonrheumatic aortic valve disorder, unspecified: Secondary | ICD-10-CM

## 2014-01-03 DIAGNOSIS — Z8639 Personal history of other endocrine, nutritional and metabolic disease: Secondary | ICD-10-CM

## 2014-01-03 DIAGNOSIS — J438 Other emphysema: Secondary | ICD-10-CM

## 2014-01-03 DIAGNOSIS — E119 Type 2 diabetes mellitus without complications: Secondary | ICD-10-CM

## 2014-01-03 DIAGNOSIS — R0609 Other forms of dyspnea: Secondary | ICD-10-CM

## 2014-01-03 NOTE — Progress Notes (Signed)
Patient ID: [name], male   DOB: 1945-06-14, 68 y.o.   MRN: [id number]      SUBJECTIVE: The patient returns for followup for coronary artery disease, atrial flutter and severe aortic stenosis. He saw Dr. Tonny Bollman for consideration of TAVR, but wanted more time to think about it before proceeding. He has COPD and uses oxygen at night. He has been having nosebleeds every other day. His dizziness and dyspnea on exertion and in stable. He denies chest pain and syncope. He has several questions with regards to aortic stenosis and TAVR, as well as outcomes.   Review of Systems: As per "subjective", otherwise negative.  No Known Allergies  Current Outpatient Prescriptions  Medication Sig Dispense Refill  . albuterol-ipratropium (COMBIVENT) 18-103 MCG/ACT inhaler Inhale 2 puffs into the lungs every 6 (six) hours as needed.        Marland Kitchen ascorbic acid (VITAMIN C) 500 MG tablet Take 500 mg by mouth daily.       . BUDESONIDE IN 1 vial in neb twice daily  Pt unsure about strength      . CETIRIZINE HCL PO Take 10 mg by mouth daily.        . clopidogrel (PLAVIX) 75 MG tablet Take 75 mg by mouth daily.        Marland Kitchen co-enzyme Q-10 30 MG capsule Take 30 mg by mouth daily.        Marland Kitchen desonide (DESOWEN) 0.05 % cream Apply topically at bedtime as needed and may repeat dose one time if needed.      . diltiazem (CARDIZEM) 30 MG tablet Take 1 tablet (30 mg total) by mouth 2 (two) times daily.  180 tablet  3  . ezetimibe (ZETIA) 10 MG tablet Take 10 mg by mouth daily.        . famotidine (PEPCID) 20 MG tablet One at bedtime  30 tablet  2  . formoterol (PERFOROMIST) 20 MCG/2ML nebulizer solution Take 2 mLs (20 mcg total) by nebulization 2 (two) times daily. Use in nebulizer twice daily perfectly regularly  120 mL  11  . furosemide (LASIX) 40 MG tablet Take 1 tablet (40 mg total) by mouth daily.  90 tablet  3  . insulin NPH-insulin regular (NOVOLIN 70/30) (70-30) 100 UNIT/ML injection Inject 76-78 Units into  the skin 2 (two) times daily.       Marland Kitchen ipratropium-albuterol (DUONEB) 0.5-2.5 (3) MG/3ML SOLN Take 3 mLs by nebulization 4 (four) times daily as needed.        Marland Kitchen ketoconazole (NIZORAL) 2 % cream Apply topically daily.      Marland Kitchen loratadine (CLARITIN) 10 MG tablet Take 10 mg by mouth daily.      Marland Kitchen losartan (COZAAR) 100 MG tablet Take 50 mg by mouth daily.      . metFORMIN (GLUCOPHAGE) 1000 MG tablet Take 1,000 mg by mouth 2 (two) times daily.        . metoprolol (TOPROL-XL) 50 MG 24 hr tablet Take 50 mg by mouth 2 (two) times daily.        . Multiple Vitamin (MULTIVITAMIN) tablet Take 1 tablet by mouth daily.        Marland Kitchen oxyCODONE-acetaminophen (PERCOCET) 5-325 MG per tablet Take 1 tablet by mouth every 4 (four) hours as needed.        . pantoprazole (PROTONIX) 40 MG tablet Take 1 tablet (40 mg total) by mouth daily. Take 30-60 min before first meal of the day  30 tablet  2  .  potassium chloride SA (KLOR-CON M20) 20 MEQ tablet Take 1 tablet (20 mEq total) by mouth daily.  90 tablet  3  . predniSONE (DELTASONE) 5 MG tablet Take 5 mg by mouth daily.       . rivaroxaban (XARELTO) 20 MG TABS tablet Take 1 tablet (20 mg total) by mouth daily with supper.  90 tablet  1   No current facility-administered medications for this visit.    Past Medical History  Diagnosis Date  . Rheumatoid arthritis(714.0)     infliximab infusions every month   . COPD (chronic obstructive pulmonary disease)   . Elevated liver function tests   . Hypertension   . Diabetes mellitus     Past Surgical History  Procedure Laterality Date  . Coronary stent placement      s/p bare metal stent to the vein graft to the RCA 9/20. nml LV function   . Appendectomy    . Coronary artery bypass graft  1998  . Anterior cervical decompression      with posterior decompression and fusion     History   Social History  . Marital Status: Married    Spouse Name: N/A    Number of Children: N/A  . Years of Education: N/A    Occupational History  . Retired Special educational needs teacher    Social History Main Topics  . Smoking status: Former Smoker -- 3.00 packs/day for 35 years    Types: Cigarettes    Start date: 09/15/1961    Quit date: 11/23/1996  . Smokeless tobacco: Current User    Types: Chew     Comment: started chewing in 2001/chews 2-3 packs per week  . Alcohol Use: No  . Drug Use: Not on file  . Sexual Activity: Not on file   Other Topics Concern  . Not on file   Social History Narrative   Married, retired.      Filed Vitals:   01/03/14 1405  BP: 107/73  Pulse: 76  Height: 5\' 11"  (1.803 m)  Weight: 286 lb (129.729 kg)    PHYSICAL EXAM General: NAD, obese Neck: No JVD, no thyromegaly or thyroid nodule.  Lungs: Overall diminished sounds throughout. No rales.  CV: Nondisplaced PMI. Heart regular with premature contractions appreciated, distant S1/diminished S2, no S3/S4, III/VI harsh systolic murmur at RUSB. Trace peri-ankle edema.. No carotid bruit.  Abdomen: Soft, obese.  Neurologic: Alert and oriented x 3.  Psych: Normal affect.  Extremities: No clubbing or cyanosis.  Skin: Normal. Musculoskeletal: Normal range of motion, no gross deformities.  ECG: Most recent ECG reviewed.      ASSESSMENT AND PLAN: 1. CAD: No anginal discomfort. He does have dyspnea on exertion which is likely related to both his COPD and potentially interstitial lung disease secondary to rheumatoid arthritis, along with severe aortic stenosis. Continues on Plavix, metoprolol, diltiazem and Zetia.  2. Essential HTN: Normotensive today on current therapy. No changes are indicated.  3. Hyperlipidemia: On Zetia. Followed by PCP. Intolerant to Lipitor.  4. Paroxysmal atrial flutter: HR is normal. I will continue diltiazem 30 mg bid and Xarelto.  5. Severe aortic stenosis: He will consider TAVR. Will need right and left heart catheterization with coronary angiography beforehand. 6. COPD: Followed by Dr. . Appears to be  stable. Uses oxygen at night. 7. Leg edema: Improved since last visit. I will continue Lasix 40 mg daily along with KCl 20 meq daily. 8. 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  Dispo: f/u 3 months.  Time spent:  40 minutes, of which greater than 50% was spent on counseling regarding severe aortic stenosis and operative procedures to correct it, as well as life expectancy given his multiple comorbidities.  Prentice Docker, M.D., F.A.C.C.

## 2014-01-03 NOTE — Patient Instructions (Signed)
Continue all current medications. Follow up in  3 months 

## 2014-01-29 ENCOUNTER — Inpatient Hospital Stay (HOSPITAL_COMMUNITY)
Admission: AD | Admit: 2014-01-29 | Discharge: 2014-02-07 | DRG: 287 | Disposition: A | Discharge status: Home Health | Payer: Medicare Other | Source: Other Acute Inpatient Hospital | Attending: Cardiology | Admitting: Cardiology

## 2014-01-29 ENCOUNTER — Encounter (HOSPITAL_COMMUNITY): Payer: Self-pay | Admitting: *Deleted

## 2014-01-29 DIAGNOSIS — K5730 Diverticulosis of large intestine without perforation or abscess without bleeding: Secondary | ICD-10-CM | POA: Diagnosis present

## 2014-01-29 DIAGNOSIS — I483 Typical atrial flutter: Secondary | ICD-10-CM

## 2014-01-29 DIAGNOSIS — Z6841 Body Mass Index (BMI) 40.0 and over, adult: Secondary | ICD-10-CM

## 2014-01-29 DIAGNOSIS — Z87891 Personal history of nicotine dependence: Secondary | ICD-10-CM | POA: Diagnosis not present

## 2014-01-29 DIAGNOSIS — J449 Chronic obstructive pulmonary disease, unspecified: Secondary | ICD-10-CM | POA: Diagnosis present

## 2014-01-29 DIAGNOSIS — E785 Hyperlipidemia, unspecified: Secondary | ICD-10-CM | POA: Diagnosis not present

## 2014-01-29 DIAGNOSIS — I359 Nonrheumatic aortic valve disorder, unspecified: Secondary | ICD-10-CM | POA: Diagnosis not present

## 2014-01-29 DIAGNOSIS — Z955 Presence of coronary angioplasty implant and graft: Secondary | ICD-10-CM

## 2014-01-29 DIAGNOSIS — I8510 Secondary esophageal varices without bleeding: Secondary | ICD-10-CM | POA: Diagnosis not present

## 2014-01-29 DIAGNOSIS — I350 Nonrheumatic aortic (valve) stenosis: Secondary | ICD-10-CM | POA: Diagnosis present

## 2014-01-29 DIAGNOSIS — I10 Essential (primary) hypertension: Secondary | ICD-10-CM | POA: Diagnosis present

## 2014-01-29 DIAGNOSIS — I25810 Atherosclerosis of coronary artery bypass graft(s) without angina pectoris: Secondary | ICD-10-CM | POA: Diagnosis present

## 2014-01-29 DIAGNOSIS — I5032 Chronic diastolic (congestive) heart failure: Secondary | ICD-10-CM | POA: Diagnosis present

## 2014-01-29 DIAGNOSIS — K7581 Nonalcoholic steatohepatitis (NASH): Secondary | ICD-10-CM | POA: Diagnosis present

## 2014-01-29 DIAGNOSIS — D509 Iron deficiency anemia, unspecified: Secondary | ICD-10-CM | POA: Diagnosis present

## 2014-01-29 DIAGNOSIS — E109 Type 1 diabetes mellitus without complications: Secondary | ICD-10-CM | POA: Diagnosis not present

## 2014-01-29 DIAGNOSIS — K319 Disease of stomach and duodenum, unspecified: Secondary | ICD-10-CM | POA: Diagnosis not present

## 2014-01-29 DIAGNOSIS — R0609 Other forms of dyspnea: Secondary | ICD-10-CM | POA: Diagnosis present

## 2014-01-29 DIAGNOSIS — I2582 Chronic total occlusion of coronary artery: Secondary | ICD-10-CM | POA: Diagnosis not present

## 2014-01-29 DIAGNOSIS — E669 Obesity, unspecified: Secondary | ICD-10-CM

## 2014-01-29 DIAGNOSIS — Z9981 Dependence on supplemental oxygen: Secondary | ICD-10-CM | POA: Diagnosis not present

## 2014-01-29 DIAGNOSIS — D500 Iron deficiency anemia secondary to blood loss (chronic): Secondary | ICD-10-CM | POA: Diagnosis present

## 2014-01-29 DIAGNOSIS — E119 Type 2 diabetes mellitus without complications: Secondary | ICD-10-CM | POA: Diagnosis present

## 2014-01-29 DIAGNOSIS — Z7901 Long term (current) use of anticoagulants: Secondary | ICD-10-CM | POA: Diagnosis not present

## 2014-01-29 DIAGNOSIS — Z9861 Coronary angioplasty status: Secondary | ICD-10-CM | POA: Diagnosis not present

## 2014-01-29 DIAGNOSIS — IMO0002 Reserved for concepts with insufficient information to code with codable children: Secondary | ICD-10-CM | POA: Diagnosis not present

## 2014-01-29 DIAGNOSIS — E871 Hypo-osmolality and hyponatremia: Secondary | ICD-10-CM | POA: Diagnosis present

## 2014-01-29 DIAGNOSIS — M069 Rheumatoid arthritis, unspecified: Secondary | ICD-10-CM | POA: Diagnosis not present

## 2014-01-29 DIAGNOSIS — I481 Persistent atrial fibrillation: Secondary | ICD-10-CM | POA: Diagnosis present

## 2014-01-29 DIAGNOSIS — I484 Atypical atrial flutter: Secondary | ICD-10-CM

## 2014-01-29 DIAGNOSIS — I4891 Unspecified atrial fibrillation: Secondary | ICD-10-CM | POA: Diagnosis not present

## 2014-01-29 DIAGNOSIS — Z951 Presence of aortocoronary bypass graft: Secondary | ICD-10-CM | POA: Diagnosis not present

## 2014-01-29 DIAGNOSIS — K648 Other hemorrhoids: Secondary | ICD-10-CM | POA: Diagnosis present

## 2014-01-29 DIAGNOSIS — K7460 Unspecified cirrhosis of liver: Secondary | ICD-10-CM | POA: Diagnosis not present

## 2014-01-29 DIAGNOSIS — I5033 Acute on chronic diastolic (congestive) heart failure: Secondary | ICD-10-CM | POA: Diagnosis present

## 2014-01-29 DIAGNOSIS — E1169 Type 2 diabetes mellitus with other specified complication: Secondary | ICD-10-CM

## 2014-01-29 DIAGNOSIS — I4892 Unspecified atrial flutter: Secondary | ICD-10-CM | POA: Diagnosis present

## 2014-01-29 DIAGNOSIS — I509 Heart failure, unspecified: Secondary | ICD-10-CM | POA: Diagnosis not present

## 2014-01-29 DIAGNOSIS — I2510 Atherosclerotic heart disease of native coronary artery without angina pectoris: Secondary | ICD-10-CM | POA: Diagnosis present

## 2014-01-29 DIAGNOSIS — D696 Thrombocytopenia, unspecified: Secondary | ICD-10-CM | POA: Diagnosis present

## 2014-01-29 DIAGNOSIS — K219 Gastro-esophageal reflux disease without esophagitis: Secondary | ICD-10-CM | POA: Diagnosis present

## 2014-01-29 DIAGNOSIS — I482 Chronic atrial fibrillation: Secondary | ICD-10-CM | POA: Diagnosis present

## 2014-01-29 DIAGNOSIS — Z794 Long term (current) use of insulin: Secondary | ICD-10-CM | POA: Diagnosis not present

## 2014-01-29 DIAGNOSIS — K766 Portal hypertension: Secondary | ICD-10-CM | POA: Diagnosis present

## 2014-01-29 DIAGNOSIS — R0600 Dyspnea, unspecified: Secondary | ICD-10-CM | POA: Diagnosis present

## 2014-01-29 DIAGNOSIS — J438 Other emphysema: Secondary | ICD-10-CM | POA: Diagnosis not present

## 2014-01-29 DIAGNOSIS — R0989 Other specified symptoms and signs involving the circulatory and respiratory systems: Secondary | ICD-10-CM

## 2014-01-29 DIAGNOSIS — K861 Other chronic pancreatitis: Secondary | ICD-10-CM | POA: Diagnosis present

## 2014-01-29 DIAGNOSIS — Z7952 Long term (current) use of systemic steroids: Secondary | ICD-10-CM | POA: Diagnosis not present

## 2014-01-29 HISTORY — DX: Other chronic pancreatitis: K86.1

## 2014-01-29 HISTORY — DX: Unspecified atrial flutter: I48.92

## 2014-01-29 HISTORY — DX: Other cervical disc degeneration, unspecified cervical region: M50.30

## 2014-01-29 HISTORY — DX: Atherosclerosis of coronary artery bypass graft(s) without angina pectoris: I25.810

## 2014-01-29 HISTORY — DX: Unspecified cirrhosis of liver: K74.60

## 2014-01-29 HISTORY — DX: Other intervertebral disc degeneration, lumbar region: M51.36

## 2014-01-29 HISTORY — DX: Acute on chronic diastolic (congestive) heart failure: I50.33

## 2014-01-29 HISTORY — DX: Gastro-esophageal reflux disease without esophagitis: K21.9

## 2014-01-29 HISTORY — DX: Nonrheumatic aortic (valve) stenosis: I35.0

## 2014-01-29 HISTORY — DX: Portal hypertension: K76.6

## 2014-01-29 HISTORY — DX: Morbid (severe) obesity due to excess calories: E66.01

## 2014-01-29 HISTORY — DX: Unspecified atrial fibrillation: I48.91

## 2014-01-29 HISTORY — DX: Presence of aortocoronary bypass graft: Z95.1

## 2014-01-29 HISTORY — DX: Chronic diastolic (congestive) heart failure: I50.32

## 2014-01-29 HISTORY — DX: Atherosclerotic heart disease of native coronary artery without angina pectoris: I25.10

## 2014-01-29 HISTORY — DX: Epistaxis: R04.0

## 2014-01-29 LAB — GLUCOSE, CAPILLARY: Glucose-Capillary: 135 mg/dL — ABNORMAL HIGH (ref 70–99)

## 2014-01-29 MED ORDER — SODIUM CHLORIDE 0.9 % IV SOLN: 250.0000 mL | INTRAVENOUS | Status: DC | PRN

## 2014-01-29 MED ORDER — IPRATROPIUM-ALBUTEROL 18-103 MCG/ACT IN AERO: 2.0000 | INHALATION_SPRAY | Freq: Four times a day (QID) | RESPIRATORY_TRACT | Status: DC | PRN

## 2014-01-29 MED ORDER — ARFORMOTEROL TARTRATE 15 MCG/2ML IN NEBU
15.0000 ug | INHALATION_SOLUTION | Freq: Two times a day (BID) | RESPIRATORY_TRACT | Status: DC
  Administered 2014-02-03 – 2014-02-04 (×3): 15 ug via RESPIRATORY_TRACT
  Filled 2014-01-29 (×20): qty 2

## 2014-01-29 MED ORDER — PREDNISONE 5 MG PO TABS
5.0000 mg | ORAL_TABLET | Freq: Every day | ORAL | Status: DC
  Administered 2014-01-30 – 2014-02-07 (×8): 5 mg via ORAL
  Filled 2014-01-29 (×9): qty 1

## 2014-01-29 MED ORDER — SODIUM CHLORIDE 0.9 % IJ SOLN
3.0000 mL | Freq: Two times a day (BID) | INTRAMUSCULAR | Status: DC
  Administered 2014-01-30 – 2014-02-07 (×16): 3 mL via INTRAVENOUS

## 2014-01-29 MED ORDER — ONDANSETRON HCL 4 MG/2ML IJ SOLN: 4.0000 mg | Freq: Four times a day (QID) | INTRAMUSCULAR | Status: DC | PRN

## 2014-01-29 MED ORDER — EZETIMIBE 10 MG PO TABS
10.0000 mg | ORAL_TABLET | Freq: Every day | ORAL | Status: DC
  Administered 2014-01-30 – 2014-02-06 (×9): 10 mg via ORAL
  Filled 2014-01-29 (×10): qty 1

## 2014-01-29 MED ORDER — LOSARTAN POTASSIUM 25 MG PO TABS
25.0000 mg | ORAL_TABLET | Freq: Every day | ORAL | Status: DC
  Administered 2014-01-30 – 2014-02-06 (×6): 25 mg via ORAL
  Filled 2014-01-29 (×8): qty 1

## 2014-01-29 MED ORDER — METOPROLOL SUCCINATE ER 25 MG PO TB24
25.0000 mg | ORAL_TABLET | Freq: Two times a day (BID) | ORAL | Status: DC
  Administered 2014-01-30 – 2014-02-07 (×13): 25 mg via ORAL
  Filled 2014-01-29 (×19): qty 1

## 2014-01-29 MED ORDER — GUAIFENESIN 100 MG/5ML PO SYRP
200.0000 mg | ORAL_SOLUTION | Freq: Three times a day (TID) | ORAL | Status: DC | PRN
  Administered 2014-01-30: 200 mg via ORAL
  Filled 2014-01-29 (×2): qty 10

## 2014-01-29 MED ORDER — INSULIN GLARGINE 100 UNIT/ML ~~LOC~~ SOLN
80.0000 [IU] | Freq: Every day | SUBCUTANEOUS | Status: DC
  Administered 2014-01-30: 80 [IU] via SUBCUTANEOUS
  Filled 2014-01-29 (×2): qty 0.8

## 2014-01-29 MED ORDER — POTASSIUM CHLORIDE CRYS ER 20 MEQ PO TBCR
20.0000 meq | EXTENDED_RELEASE_TABLET | Freq: Every day | ORAL | Status: DC
  Administered 2014-01-30 – 2014-02-07 (×8): 20 meq via ORAL
  Filled 2014-01-29 (×10): qty 1

## 2014-01-29 MED ORDER — IPRATROPIUM-ALBUTEROL 0.5-2.5 (3) MG/3ML IN SOLN
3.0000 mL | Freq: Four times a day (QID) | RESPIRATORY_TRACT | Status: DC | PRN
  Administered 2014-01-30 – 2014-02-07 (×11): 3 mL via RESPIRATORY_TRACT
  Filled 2014-01-29 (×12): qty 3

## 2014-01-29 MED ORDER — METFORMIN HCL 500 MG PO TABS
500.0000 mg | ORAL_TABLET | Freq: Two times a day (BID) | ORAL | Status: DC
  Administered 2014-01-31: 500 mg via ORAL
  Filled 2014-01-29 (×5): qty 1

## 2014-01-29 MED ORDER — DILTIAZEM HCL 30 MG PO TABS
30.0000 mg | ORAL_TABLET | Freq: Two times a day (BID) | ORAL | Status: DC
  Administered 2014-01-30 – 2014-02-07 (×17): 30 mg via ORAL
  Filled 2014-01-29 (×19): qty 1

## 2014-01-29 MED ORDER — LORATADINE 10 MG PO TABS
10.0000 mg | ORAL_TABLET | Freq: Every day | ORAL | Status: DC
  Administered 2014-01-30 – 2014-02-07 (×8): 10 mg via ORAL
  Filled 2014-01-29 (×9): qty 1

## 2014-01-29 MED ORDER — LORATADINE 10 MG PO TABS: 10.0000 mg | ORAL_TABLET | ORAL | Status: DC

## 2014-01-29 MED ORDER — KETOCONAZOLE 2 % EX CREA: 1.0000 "application " | TOPICAL_CREAM | Freq: Every day | CUTANEOUS | Status: DC | PRN

## 2014-01-29 MED ORDER — SODIUM CHLORIDE 0.9 % IJ SOLN
3.0000 mL | INTRAMUSCULAR | Status: DC | PRN
  Administered 2014-01-31: 3 mL via INTRAVENOUS

## 2014-01-29 MED ORDER — OXYCODONE-ACETAMINOPHEN 5-325 MG PO TABS
1.0000 | ORAL_TABLET | ORAL | Status: DC | PRN
  Administered 2014-01-30 – 2014-02-07 (×14): 1 via ORAL
  Filled 2014-01-29 (×14): qty 1

## 2014-01-29 MED ORDER — BUDESONIDE 0.25 MG/2ML IN SUSP
0.2500 mg | Freq: Two times a day (BID) | RESPIRATORY_TRACT | Status: DC
  Administered 2014-02-03 – 2014-02-04 (×3): 0.25 mg via RESPIRATORY_TRACT
  Filled 2014-01-29 (×19): qty 2

## 2014-01-29 MED ORDER — INFLUENZA VAC SPLIT QUAD 0.5 ML IM SUSY
0.5000 mL | PREFILLED_SYRINGE | INTRAMUSCULAR | Status: AC
Start: 2014-01-30 — End: 2014-01-30
  Administered 2014-01-30: 0.5 mL via INTRAMUSCULAR
  Filled 2014-01-29: qty 0.5

## 2014-01-29 MED ORDER — INSULIN ASPART 100 UNIT/ML ~~LOC~~ SOLN
0.0000 [IU] | SUBCUTANEOUS | Status: DC
  Administered 2014-01-30 (×2): 5 [IU] via SUBCUTANEOUS
  Administered 2014-01-30: 2 [IU] via SUBCUTANEOUS

## 2014-01-29 MED ORDER — FUROSEMIDE 40 MG PO TABS
40.0000 mg | ORAL_TABLET | Freq: Every day | ORAL | Status: DC
  Administered 2014-01-30: 40 mg via ORAL
  Filled 2014-01-29: qty 1

## 2014-01-29 MED ORDER — LEVOFLOXACIN IN D5W 750 MG/150ML IV SOLN
750.0000 mg | INTRAVENOUS | Status: AC
  Administered 2014-01-30 – 2014-02-02 (×4): 750 mg via INTRAVENOUS
  Filled 2014-01-29 (×4): qty 150

## 2014-01-29 MED ORDER — ACETAMINOPHEN 325 MG PO TABS
650.0000 mg | ORAL_TABLET | ORAL | Status: DC | PRN
Start: 2014-01-29 — End: 2014-02-07
  Administered 2014-01-31: 650 mg via ORAL
  Filled 2014-01-29: qty 2

## 2014-01-29 MED ORDER — RIVAROXABAN 20 MG PO TABS
20.0000 mg | ORAL_TABLET | Freq: Every day | ORAL | Status: DC
  Administered 2014-01-30: 20 mg via ORAL
  Filled 2014-01-29 (×2): qty 1

## 2014-01-29 NOTE — H&P (Signed)
CARDIOLOGY ADMISSION NOTE  Patient ID: [name] MRN: [id number] DOB/AGE: [birth year] 68 y.o.  Admit date: 01/29/2014 Primary Physician   Donzetta Sprung, MD Primary Cardiologist   Dr. Purvis Sheffield Chief Complaint    Dyspnea  HPI:  The patient has a history of severe AS.  He is being evaluated for possible TAVR.  He also has CAD, atrial fib and COPD with O2 at night.  He saw his primary MD yesterday and he had weight gain and increased SOB.  He had gained about 20 lbs in 3 weeks.  He was now dyspneic walking through his home.  He was not describing new PND or orthopnea.  He was not having chest pain/pressure or neck or arm pain.  He had no new palpitations.  He has a chronic cough without production and he has had no recent fevers or chills.  He was referred to the ED at [facility] and then admitted. His Hbg was 9 which was reduced from 13.3 on 12/11/13.   BNP was slightly high at 163.  CT of the chest demonstrated no PE.  He was admitted for treatment of acute hypoxemia.  During that admission he had anemia work up which suggested a low iron.  He has not had any history of recent GI bleeding or suggestion of this.  Stool guaiac was apparently pending at [facility].  He was transfused.  He did get antibiotics as CT suggested possible aspiration.  He was also given Lasix.  He is breathing better since the transfusion he reports.   The decision was made to send him to [facility] to continue work up for possible TAVR.  He had previously deferred this work up until he thought about it more.    Past Medical History  Diagnosis Date  . Rheumatoid arthritis(714.0)     infliximab infusions every month   . COPD (chronic obstructive pulmonary disease)   . Elevated liver function tests   . Hypertension   . Diabetes mellitus     Past Surgical History  Procedure Laterality Date  . Coronary stent placement      s/p bare metal stent to the vein graft to the RCA 9/20. nml LV function   . Appendectomy    .  Coronary artery bypass graft  1998  . Anterior cervical decompression      with posterior decompression and fusion     No Known Allergies No current facility-administered medications on file prior to encounter.   Current Outpatient Prescriptions on File Prior to Encounter  Medication Sig Dispense Refill  . albuterol-ipratropium (COMBIVENT) 18-103 MCG/ACT inhaler Inhale 2 puffs into the lungs every 6 (six) hours as needed.        Marland Kitchen ascorbic acid (VITAMIN C) 500 MG tablet Take 500 mg by mouth daily.       . BUDESONIDE IN 1 vial in neb twice daily  Pt unsure about strength      . CETIRIZINE HCL PO Take 10 mg by mouth daily.        . clopidogrel (PLAVIX) 75 MG tablet Take 75 mg by mouth daily.        Marland Kitchen co-enzyme Q-10 30 MG capsule Take 30 mg by mouth daily.        Marland Kitchen desonide (DESOWEN) 0.05 % cream Apply topically at bedtime as needed and may repeat dose one time if needed.      . diltiazem (CARDIZEM) 30 MG tablet Take 1 tablet (30 mg total) by mouth 2 (two) times  daily.  180 tablet  3  . ezetimibe (ZETIA) 10 MG tablet Take 10 mg by mouth daily.        . famotidine (PEPCID) 20 MG tablet One at bedtime  30 tablet  2  . formoterol (PERFOROMIST) 20 MCG/2ML nebulizer solution Take 2 mLs (20 mcg total) by nebulization 2 (two) times daily. Use in nebulizer twice daily perfectly regularly  120 mL  11  . furosemide (LASIX) 40 MG tablet Take 1 tablet (40 mg total) by mouth daily.  90 tablet  3  . insulin NPH-insulin regular (NOVOLIN 70/30) (70-30) 100 UNIT/ML injection Inject 76-78 Units into the skin 2 (two) times daily.       Marland Kitchen ipratropium-albuterol (DUONEB) 0.5-2.5 (3) MG/3ML SOLN Take 3 mLs by nebulization 4 (four) times daily as needed.        Marland Kitchen ketoconazole (NIZORAL) 2 % cream Apply topically daily.      Marland Kitchen loratadine (CLARITIN) 10 MG tablet Take 10 mg by mouth daily.      Marland Kitchen losartan (COZAAR) 100 MG tablet Take 50 mg by mouth daily.      . metFORMIN (GLUCOPHAGE) 1000 MG tablet Take 1,000 mg by  mouth 2 (two) times daily.        . metoprolol (TOPROL-XL) 50 MG 24 hr tablet Take 50 mg by mouth 2 (two) times daily.        . Multiple Vitamin (MULTIVITAMIN) tablet Take 1 tablet by mouth daily.        Marland Kitchen oxyCODONE-acetaminophen (PERCOCET) 5-325 MG per tablet Take 1 tablet by mouth every 4 (four) hours as needed.        . pantoprazole (PROTONIX) 40 MG tablet Take 1 tablet (40 mg total) by mouth daily. Take 30-60 min before first meal of the day  30 tablet  2  . potassium chloride SA (KLOR-CON M20) 20 MEQ tablet Take 1 tablet (20 mEq total) by mouth daily.  90 tablet  3  . predniSONE (DELTASONE) 5 MG tablet Take 5 mg by mouth daily.       . rivaroxaban (XARELTO) 20 MG TABS tablet Take 1 tablet (20 mg total) by mouth daily with supper.  90 tablet  1   History   Social History  . Marital Status: Married    Spouse Name: N/A    Number of Children: N/A  . Years of Education: N/A   Occupational History  . Retired Special educational needs teacher    Social History Main Topics  . Smoking status: Former Smoker -- 3.00 packs/day for 35 years    Types: Cigarettes    Start date: 09/15/1961    Quit date: 11/23/1996  . Smokeless tobacco: Current User    Types: Chew     Comment: started chewing in 2001/chews 2-3 packs per week  . Alcohol Use: No  . Drug Use: Not on file  . Sexual Activity: Not on file   Other Topics Concern  . Not on file   Social History Narrative   Married, retired.     Family History  Problem Relation Age of Onset  . Hypertension Mother   . Cancer Father   . Stroke Brother   . Liver cancer Paternal Grandfather   . Rheum arthritis Maternal Aunt   . Asthma Child   . Emphysema Brother     smoked     ROS:  Severe back and leg pain.  Otherwise as stated in the HPI and negative for all other systems.  Physical Exam: Temperature 98.7  F (37.1 C), temperature source Oral.  BP 120/60.  HR 70s GENERAL:  Well appearing but looks older than stated age.   HEENT:  Pupils equal round and  reactive, fundi not visualized, oral mucosa unremarkable NECK:  No jugular venous distention, waveform within normal limits, carotid upstroke brisk and symmetric, no bruits, no thyromegaly LYMPHATICS:  No cervical, inguinal adenopathy LUNGS:  Clear to auscultation bilaterally BACK:  No CVA tenderness CHEST:  Unremarkable HEART:  PMI not displaced or sustained,S1 and S2 within normal limits, no S3,  no clicks, no rubs, apical mid peaking systolic murmur, no diastolic murmurs ABD:  Flat, positive bowel sounds normal in frequency in pitch, no bruits, no rebound, no guarding, no midline pulsatile mass, no hepatomegaly, no splenomegaly, obese EXT:  2 plus pulses upper and absent DP/PT bilateral lower, moderate leg edema, no cyanosis no clubbing SKIN:  No rashes no nodules NEURO:  Cranial nerves II through XII grossly intact, motor grossly intact throughout PSYCH:  Cognitively intact, oriented to person place and time  Labs: H/H:  9/28.5, WBC 7.4, INR 1.5, Na 130, BUN 15, Creat .86, K 3.8,   Radiology:  CXR:  ([facility]) Small right pleural effusion, No overt edema  EKG:  Atrial flutter with variable conduction.  Incomplete RBBB, Poor anterior R wave progression.  No acute ST T wave changes.  01/28/14  ASSESSMENT AND PLAN:    ACUTE DYSPNEA:  Multifactorial (see below).    ACUTE ON CHRONIC DIASTOLIC HEART FAILURE:  Secondary to AS.    He will get a cardiac cath probably this admission as he now wants to go ahead with further work up for possible TAVR.  Dr. Excell Seltzer will need to be notified of his admission.  I will give him PO Lasix.  He does have extremity edema but no overt pulmonary edema at this point.    ATRIAL FIB/FlUTTER:  I will continue the Xarelto today.  I don't anticipate a cath tomorrow pending the GI evaluation.    CAD:  He has no active ischemia symptoms.  He will eventually get a right and left heart cath.    COPD:  Continue O2 and previous meds.  I will continue the prednisone he  was ordered.    ANEMIA:  Acute drop in Hbg since August.  He was transfused at [facility].  Work up there was indicative of acute blood loss anemia with a probable GI source.  He was started on iron.  He will need a GI consult.  I will keep him NPO. (Call GI in the AM)  I agree with holding his Plavix.    DM:  I will give him half of his Glucophage since he is NPO.  I will cover with Lantus 80 units at night with SSI.   HTN:  His BP was low at [facility].  I will give him half of his beta blocker and half of his Cozaar.   ABNORMAL CT:  Of note he had a CT which suggested a small amount of aspirated material on the right side in the trachea.  He will continue his Levaquin 150 mg daily for 5 days.    HYPONATREMIA:  Follow.      SignedRollene Rotunda 01/29/2014, 9:56 PM

## 2014-01-30 ENCOUNTER — Encounter (HOSPITAL_COMMUNITY): Payer: Self-pay | Admitting: Thoracic Surgery (Cardiothoracic Vascular Surgery)

## 2014-01-30 ENCOUNTER — Inpatient Hospital Stay (HOSPITAL_COMMUNITY): Payer: Medicare Other

## 2014-01-30 DIAGNOSIS — I5033 Acute on chronic diastolic (congestive) heart failure: Secondary | ICD-10-CM | POA: Diagnosis not present

## 2014-01-30 DIAGNOSIS — I359 Nonrheumatic aortic valve disorder, unspecified: Secondary | ICD-10-CM

## 2014-01-30 DIAGNOSIS — D649 Anemia, unspecified: Secondary | ICD-10-CM

## 2014-01-30 DIAGNOSIS — I5031 Acute diastolic (congestive) heart failure: Secondary | ICD-10-CM

## 2014-01-30 DIAGNOSIS — I25810 Atherosclerosis of coronary artery bypass graft(s) without angina pectoris: Secondary | ICD-10-CM

## 2014-01-30 LAB — BASIC METABOLIC PANEL
ANION GAP: 9 (ref 5–15)
BUN: 13 mg/dL (ref 6–23)
CHLORIDE: 99 meq/L (ref 96–112)
CO2: 26 mEq/L (ref 19–32)
Calcium: 8.5 mg/dL (ref 8.4–10.5)
Creatinine, Ser: 0.85 mg/dL (ref 0.50–1.35)
GFR, EST NON AFRICAN AMERICAN: 87 mL/min — AB (ref >90)
Glucose, Bld: 178 mg/dL — ABNORMAL HIGH (ref 70–99)
Potassium: 4.3 mEq/L (ref 3.7–5.3)
SODIUM: 134 meq/L — AB (ref 137–147)

## 2014-01-30 LAB — GLUCOSE, CAPILLARY
GLUCOSE-CAPILLARY: 100 mg/dL — AB (ref 70–99)
GLUCOSE-CAPILLARY: 172 mg/dL — AB (ref 70–99)
GLUCOSE-CAPILLARY: 256 mg/dL — AB (ref 70–99)
Glucose-Capillary: 176 mg/dL — ABNORMAL HIGH (ref 70–99)
Glucose-Capillary: 84 mg/dL (ref 70–99)
Glucose-Capillary: 253 mg/dL — ABNORMAL HIGH (ref 70–99)
Glucose-Capillary: 196 mg/dL — ABNORMAL HIGH (ref 70–99)

## 2014-01-30 LAB — MRSA PCR SCREENING: MRSA BY PCR: POSITIVE — AB

## 2014-01-30 LAB — TSH: TSH: 3.73 u[IU]/mL (ref 0.350–4.500)

## 2014-01-30 MED ORDER — FUROSEMIDE 10 MG/ML IJ SOLN
40.0000 mg | Freq: Two times a day (BID) | INTRAMUSCULAR | Status: AC
  Administered 2014-01-30 – 2014-01-31 (×2): 40 mg via INTRAVENOUS
  Filled 2014-01-30 (×2): qty 4

## 2014-01-30 MED ORDER — INSULIN GLARGINE 100 UNIT/ML ~~LOC~~ SOLN
60.0000 [IU] | Freq: Every day | SUBCUTANEOUS | Status: DC
  Administered 2014-01-30 – 2014-02-06 (×8): 60 [IU] via SUBCUTANEOUS
  Filled 2014-01-30 (×9): qty 0.6

## 2014-01-30 MED ORDER — INSULIN ASPART 100 UNIT/ML ~~LOC~~ SOLN
0.0000 [IU] | Freq: Three times a day (TID) | SUBCUTANEOUS | Status: DC
  Administered 2014-01-31: 3 [IU] via SUBCUTANEOUS
  Administered 2014-01-31: 1 [IU] via SUBCUTANEOUS
  Administered 2014-02-02: 2 [IU] via SUBCUTANEOUS
  Administered 2014-02-02: 3 [IU] via SUBCUTANEOUS
  Administered 2014-02-02: 2 [IU] via SUBCUTANEOUS
  Administered 2014-02-03 (×2): 3 [IU] via SUBCUTANEOUS
  Administered 2014-02-04: 2 [IU] via SUBCUTANEOUS
  Administered 2014-02-04 – 2014-02-06 (×2): 3 [IU] via SUBCUTANEOUS
  Administered 2014-02-06 (×2): 2 [IU] via SUBCUTANEOUS
  Administered 2014-02-07: 3 [IU] via SUBCUTANEOUS

## 2014-01-30 NOTE — Progress Notes (Signed)
Following recommendation by Diabetes coordinator, patient's lantus has been decreased to 60 from 80 units at night to avoid hypoglycemia.  Ramond Dial PA Pager: [phone]

## 2014-01-30 NOTE — Progress Notes (Signed)
Utilization review completed.  

## 2014-01-30 NOTE — Consult Note (Addendum)
Multi-Disciplinary Valve Team  Patient ID: COSTA JHA MRN: [id number] DOB/AGE: 1945-11-04 68 y.o.  Admit date: 01/29/2014 Primary Cardiologist: Purvis Sheffield Reason for Consultation: Severe aortic valve stenosis  HPI: 68 yo male with history of severe aortic valve stenosis, RA, COPD, HTN, DM, CAD, GERD, atrial fibrillation/flutter, obesity and former tobacco abuse who is transferred to [facility] for further management of acute CHF and further evaluation of his aortic stenosis with consideration for TAVR. He is being followed by the general cardiology team. I am asked to see him today for a TAVR consult. He was admitted at [facility] with weight gain and increased SOB, up 20 lbs over 3 weeks. He was found to be anemic with Hgb of 9.0 and was transfused. No evidence of GI bleeding. Chest CT negative for PE. He was given antibiotics for possible aspiration pneumonia seen on CT chest.   He is known to have severe aortic valve stenosis with last echo May 2015 with mean gradient of 41 mmHg, peak gradient of 54 mmHg. He has an extensive cardiac history with remote CABG in 1998. He then underwent PCI of the saphenous vein graft to right coronary artery in 2012. The patient has long-standing dyspnea with chronic cough. He is on nocturnal oxygen and also uses oxygen during the day at times. He is primarily limited by shortness of breath, but also has significant back and leg problems. His low back pain radiates into his left leg. He denies chest pain or pressure. He has had no lightheadedness or syncope.  He was a smoker until the time of his bypass surgery in 1998. He does not drink alcohol. He notes 17 lb weight gain over last 3 weeks leading to dyspnea with minimal exertion prompting presentation to primary care and then admission to [facility]. He was seen by Dr. Excell Seltzer in the TAVR clinic 10/30/13 and there was a discussion regarding the need for aortic valve replacement versus TAVR. Given his multiple  comorbidities including advanced COPD, insulin dependent diabetes mellitus, morbid obesity, chronic low back pain with poor functional status, and chronic prednisone therapy he was felt to be a high risk surgical candidate. TAVR was discussed as an option but the patient declined further evaluation at that time.   He is now willing to discuss TAVR as an option. At this time he notes his breathing seems to be much better following diuresis and transfusion of pRBCs. No chest pain or dizziness. Still with LE edema.     Past Medical History  Diagnosis Date  . Rheumatoid arthritis(714.0)     infliximab infusions every month   . COPD (chronic obstructive pulmonary disease)   . Elevated liver function tests   . Hypertension   . Diabetes mellitus   . Aortic stenosis   . CAD (coronary artery disease)   . GERD (gastroesophageal reflux disease)   . Atrial fibrillation     Family History  Problem Relation Age of Onset  . Hypertension Mother   . Cancer Father   . Stroke Brother   . Liver cancer Paternal Grandfather   . Rheum arthritis Maternal Aunt   . Asthma Child   . Emphysema Brother     smoked    History   Social History  . Marital Status: Married    Spouse Name: N/A    Number of Children: N/A  . Years of Education: N/A   Occupational History  . Retired Special educational needs teacher    Social History Main Topics  . Smoking status:  Former Smoker -- 3.00 packs/day for 35 years    Types: Cigarettes    Start date: 09/15/1961    Quit date: 11/23/1996  . Smokeless tobacco: Current User    Types: Chew     Comment: started chewing in 2001/chews 2-3 packs per week  . Alcohol Use: No  . Drug Use: No  . Sexual Activity: Not on file   Other Topics Concern  . Not on file   Social History Narrative   Married, retired.     Past Surgical History  Procedure Laterality Date  . Coronary stent placement      s/p bare metal stent to the vein graft to the RCA 9/20. nml LV function   . Appendectomy    .  Coronary artery bypass graft  1998    5 vessel  . Anterior cervical decompression      with posterior decompression and fusion   . Tonsillectomy and adenoidectomy      No Known Allergies  Hospital Medication:  . arformoterol  15 mcg Nebulization Q12H  . budesonide  0.25 mg Nebulization BID  . diltiazem  30 mg Oral BID  . ezetimibe  10 mg Oral QHS  . furosemide  40 mg Oral Daily  . insulin aspart  0-9 Units Subcutaneous 6 times per day  . insulin glargine  80 Units Subcutaneous QHS  . levofloxacin (LEVAQUIN) IV  750 mg Intravenous Q24H  . loratadine  10 mg Oral Daily  . losartan  25 mg Oral Daily  . metFORMIN  500 mg Oral BID WC  . metoprolol succinate  25 mg Oral BID  . potassium chloride SA  20 mEq Oral Daily  . predniSONE  5 mg Oral Daily  . rivaroxaban  20 mg Oral Q supper  . sodium chloride  3 mL Intravenous Q12H    Review of systems complete and found to be negative unless listed above    Physical Exam: Blood pressure 122/64, pulse 102, temperature 98.8 F (37.1 C), temperature source Oral, resp. rate 23, height 5\' 11"  (1.803 m), weight 303 lb 9.2 oz (137.7 kg), SpO2 100.00%.    General: Well developed, well nourished, NAD  HEENT: OP clear, mucus membranes moist  SKIN: warm, dry. No rashes.  Neuro: No focal deficits  Musculoskeletal: Muscle strength 5/5 all ext  Psychiatric: Mood and affect normal  Neck: Hard to assess JVD, no carotid bruits, no thyromegaly, no lymphadenopathy.  Lungs: Crackles bilateral bases. No wheezes, rhonci Cardiovascular: Irregular, irregular with harsh systolic murmur. No gallops or rubs.  Abdomen:Soft. Bowel sounds present. Non-tender.  Extremities: 2-3+ bilateral lower extremity edema.   Labs:   Lab Results  Component Value Date   WBC 3.3* 11/24/2011   HGB 15.1 11/24/2011   HCT 45.3 11/24/2011   MCV 93.8 11/24/2011   PLT 83* 11/24/2011    Recent Labs Lab 01/30/14 0257  NA 134*  K 4.3  CL 99  CO2 26  BUN 13  CREATININE 0.85    CALCIUM 8.5  GLUCOSE 178*    2-D echocardiogram 09/14/2013:  Study Conclusions  - Left ventricle: The cavity size was normal. Wall thickness was increased in a pattern of mild LVH. Systolic function was normal. The estimated ejection fraction was in the range of 60% to 65%. Cannot exclude hypokinesis of the basalinferior myocardium. The study is not technically sufficient to allow evaluation of LV diastolic function. - Aortic valve: Moderately calcified annulus. Probably trileaflet; severely calcified leaflets. Cusp separation was reduced. There was  severe stenosis. No significant regurgitation. Mean gradient: 49mm Hg (S). Peak gradient: 47mm Hg (S). Valve area: 0.97cm^2(VTI). Peak velocity ratio of LVOT to aortic valve: 0.28. Valve area: 0.88cm^2 (Vmax). - Mitral valve: Calcified annulus. Trivial regurgitation. - Left atrium: The atrium was mildly dilated. - Right atrium: The atrium was mildly dilated. Central venous pressure: 56mm Hg (est). - Tricuspid valve: Trivial regurgitation. - Pulmonary arteries: Systolic pressure could not be accurately estimated. - Pericardium, extracardiac: There was no pericardial effusion. Impressions:  - Mild LVH with LVEF 60-65%, cannot exclude basal inferior hypokinesis. Indeterminate diastolic function in the setting of atrial arrhythmia. Mild biatrial enlargement. MAC with trivial mitral regurgitation. Severe calcific aortic stenosis as outlined above. Gradients have increased somewhat compared to prior study. Unable to assess PASP.  Tele: atrial fibrillation  STS Risk:  Risk of Mortality: 13.11% Morbidity or Mortality: 43.425% Long Length of Stay: 28.374% Short Length of Stay: 13.017% Permanent Stroke: 1.386% Prolonged Ventilation: 36.36% DSW Infection: 0.818% Renal Failure: 19.314% Reoperation: 12.887%    ASSESSMENT AND PLAN:   1. Severe aortic valve stenosis: He is now symptomatic with CHF. He has no angina or syncope. His  CHF is likely driven by his severe AS. He would be a high risk candidate for traditional surgical approach to aortic valve replacement. His STS risk score indicates 13.11% chance of mortality with traditional AVR. He has multiple comorbidities including advanced COPD, insulin dependent diabetes mellitus, morbid obesity, chronic low back pain with poor functional status, and chronic prednisone therapy. He will be considered a candidate for TAVR. Will ask the CT surgery team to provide a surgical opinion (Dr. Laneta Simmers or Dr. Cornelius Moras). Will need right and left heart cath later this week. I will hold his Xarelto today given anemia, possible GI bleeding as well as in anticipation of cardiac cath later this week. Will arrange repeat echo and chest x-ray. Will arrange EKG this am. Anticipate cath before discharge and further planning for TAVR as an outpatient (CT scans) to avoid several large loads of contrast dye. We will follow along and will make further recommendations after his GI workup.   2. Acute diastolic CHF: Due to AS. Agree with changing to IV Lasix today  3. HTN: BP well controlled, no changes  4. DM  5. Hyperlipidemia  6. COPD  7. CAD: stable. He is s/p CABG with stenting of SVG. Will need right and left heart cath as above.    Signed: MCALHANY,CHRISTOPHER 01/30/2014, 10:39 AM

## 2014-01-30 NOTE — Progress Notes (Signed)
Inpatient Diabetes Program Recommendations  AACE/ADA: New Consensus Statement on Inpatient Glycemic Control (2013)  Target Ranges:  Prepandial:   less than 140 mg/dL      Peak postprandial:   less than 180 mg/dL (1-2 hours)      Critically ill patients:  140 - 180 mg/dL   Reason for Assessment:  Results for JIMMIE, RUETER (MRN [id number]) as of 01/30/2014 15:35  Ref. Range 01/29/2014 22:32 01/30/2014 00:19 01/30/2014 03:11 01/30/2014 07:47 01/30/2014 12:04  Glucose-Capillary Latest Range: 70-99 mg/dL 620 (H) 355 (H) 974 (H) 84 100 (H)   Home meds:  Novolin 70/30 80 units bid Hospital meds:  Lantus 80 units q HS, and Novolog sensitive q 4 hours.   Consider reducing Lantus to 60 units q HS.  Thanks, Beryl Meager, RN, BC-ADM [phone])

## 2014-01-30 NOTE — Progress Notes (Signed)
       Patient Name: [name] Date of Encounter: 01/30/2014    SUBJECTIVE: Breathing has improved since initial admission to [facility]. He denies chest pain but still has dyspnea. He was left n.p.o. expecting GI workup. This message was not passed along to our schedulers. He has heart failure, new anemia with hemoglobin of 9, chronic atrial fibrillation, and possible aspiration pneumonia.  TELEMETRY:  Atrial fibrillation with controlled rate Filed Vitals:   01/30/14 0312 01/30/14 0429 01/30/14 0750 01/30/14 0827  BP: 109/59  122/64   Pulse: 78  102   Temp: 98.8 F (37.1 C)  99 F (37.2 C)   TempSrc: Oral  Oral   Resp: 18  23   Height:      Weight:  303 lb 9.2 oz (137.7 kg)    SpO2: 98%  99% 100%    Intake/Output Summary (Last 24 hours) at 01/30/14 1054 Last data filed at 01/30/14 0000  Gross per 24 hour  Intake      0 ml  Output    600 ml  Net   -600 ml   LABS: Basic Metabolic Panel:  Recent Labs  88/91/69 0257  NA 134*  K 4.3  CL 99  CO2 26  GLUCOSE 178*  BUN 13  CREATININE 0.85  CALCIUM 8.5     Radiology/Studies:  No new data  Physical Exam: Blood pressure 122/64, pulse 102, temperature 99 F (37.2 C), temperature source Oral, resp. rate 23, height 5\' 11"  (1.803 m), weight 303 lb 9.2 oz (137.7 kg), SpO2 100.00%. Weight change:   Wt Readings from Last 3 Encounters:  01/30/14 303 lb 9.2 oz (137.7 kg)  01/03/14 286 lb (129.729 kg)  10/30/13 291 lb 12.8 oz (132.36 kg)    Bilateral diminished breath sounds at the bases but no obvious rales or wheezes Irregularly irregular rhythm with 2-3/6 systolic murmur right upper sternal border. 2+ bilateral lower extremity edema Alert and oriented with diminished hearing. Focal deficits the  ASSESSMENT:  1. Anemia, uncertain source but concern for the possibility of blood loss/iron deficiency 2. Known severe aortic stenosis 3. Acute on chronic diastolic heart failure related to aortic stenosis,  anemia, and possible other issues 4. History of coronary artery disease with previous bypass grafting  Plan:  1. We just arranged to have GI see the patient 2. we will go ahead and see the patient today 3. Continue diuresis 4. Will contact Dr. 11/01/13 concerning the request to consider TAVR when appropriately worked. 5. Will need left and right heart cath with coronary and bypass graft angiography when stable. Probably some time later this week.  Azalee Course 01/30/2014, 10:54 AM

## 2014-01-30 NOTE — Progress Notes (Signed)
Echocardiogram 2D Echocardiogram has been performed.  [name] 01/30/2014, 4:11 PM

## 2014-01-31 ENCOUNTER — Encounter (HOSPITAL_COMMUNITY): Payer: Self-pay | Admitting: Physician Assistant

## 2014-01-31 DIAGNOSIS — I350 Nonrheumatic aortic (valve) stenosis: Secondary | ICD-10-CM

## 2014-01-31 DIAGNOSIS — R0600 Dyspnea, unspecified: Secondary | ICD-10-CM

## 2014-01-31 DIAGNOSIS — I5033 Acute on chronic diastolic (congestive) heart failure: Principal | ICD-10-CM

## 2014-01-31 DIAGNOSIS — D500 Iron deficiency anemia secondary to blood loss (chronic): Secondary | ICD-10-CM | POA: Diagnosis present

## 2014-01-31 DIAGNOSIS — I4892 Unspecified atrial flutter: Secondary | ICD-10-CM

## 2014-01-31 LAB — BASIC METABOLIC PANEL
Anion gap: 10 (ref 5–15)
BUN: 14 mg/dL (ref 6–23)
CO2: 28 mEq/L (ref 19–32)
Calcium: 8.2 mg/dL — ABNORMAL LOW (ref 8.4–10.5)
Chloride: 97 mEq/L (ref 96–112)
Creatinine, Ser: 0.93 mg/dL (ref 0.50–1.35)
GFR, EST NON AFRICAN AMERICAN: 84 mL/min — AB (ref >90)
GLUCOSE: 140 mg/dL — AB (ref 70–99)
POTASSIUM: 3.8 meq/L (ref 3.7–5.3)
Sodium: 135 mEq/L — ABNORMAL LOW (ref 137–147)

## 2014-01-31 LAB — CBC
HCT: 32.2 % — ABNORMAL LOW (ref 39.0–52.0)
HEMOGLOBIN: 10.1 g/dL — AB (ref 13.0–17.0)
MCH: 29.2 pg (ref 26.0–34.0)
MCHC: 31.4 g/dL (ref 30.0–36.0)
MCV: 93.1 fL (ref 78.0–100.0)
Platelets: 147 10*3/uL — ABNORMAL LOW (ref 150–400)
RBC: 3.46 MIL/uL — AB (ref 4.22–5.81)
RDW: 15.4 % (ref 11.5–15.5)
WBC: 8.4 10*3/uL (ref 4.0–10.5)

## 2014-01-31 LAB — GLUCOSE, CAPILLARY
GLUCOSE-CAPILLARY: 146 mg/dL — AB (ref 70–99)
GLUCOSE-CAPILLARY: 246 mg/dL — AB (ref 70–99)
Glucose-Capillary: 96 mg/dL (ref 70–99)
Glucose-Capillary: 247 mg/dL — ABNORMAL HIGH (ref 70–99)
Glucose-Capillary: 115 mg/dL — ABNORMAL HIGH (ref 70–99)

## 2014-01-31 MED ORDER — PEG 3350-KCL-NA BICARB-NACL 420 G PO SOLR
4000.0000 mL | Freq: Once | ORAL | Status: AC
  Administered 2014-01-31: 4000 mL via ORAL
  Filled 2014-01-31: qty 4000

## 2014-01-31 MED ORDER — MUPIROCIN 2 % EX OINT
1.0000 "application " | TOPICAL_OINTMENT | Freq: Two times a day (BID) | CUTANEOUS | Status: AC
  Administered 2014-01-31 – 2014-02-04 (×10): 1 via NASAL
  Filled 2014-01-31 (×3): qty 22

## 2014-01-31 MED ORDER — HEPARIN (PORCINE) IN NACL 100-0.45 UNIT/ML-% IJ SOLN: 1500.0000 [IU]/h | INTRAMUSCULAR | Status: DC

## 2014-01-31 MED ORDER — HEPARIN (PORCINE) IN NACL 100-0.45 UNIT/ML-% IJ SOLN
2050.0000 [IU]/h | INTRAMUSCULAR | Status: DC
  Administered 2014-01-31: 1500 [IU]/h via INTRAVENOUS
  Filled 2014-01-31 (×3): qty 250

## 2014-01-31 MED ORDER — HEPARIN BOLUS VIA INFUSION
4000.0000 [IU] | Freq: Once | INTRAVENOUS | Status: AC
  Administered 2014-01-31: 4000 [IU] via INTRAVENOUS
  Filled 2014-01-31: qty 4000

## 2014-01-31 MED ORDER — CHLORHEXIDINE GLUCONATE CLOTH 2 % EX PADS
6.0000 | MEDICATED_PAD | Freq: Every day | CUTANEOUS | Status: AC
  Administered 2014-01-31 – 2014-02-04 (×5): 6 via TOPICAL

## 2014-01-31 NOTE — Consult Note (Signed)
Referring Provider: Dr. Katrinka Blazing Primary Care Physician:  Donzetta Sprung, MD Primary Gastroenterologist:  Gentry Fitz  Reason for Consultation:  Anemia  HPI: [name] is a 68 y.o. male admitted for CHF and evaluation of his aortic stenosis who also has CAD, COPD, IDDM, morbid obesity, chronic back pain and was on Xarelto prior to admit. Consult requested due to anemia with Hgb 9 at outside hospital that is down from 13.3 in mid-August. Hemoccult from outside hospital result not known (pending per chart history). Denies melena, hematochezia, hematemesis, abdominal pain, nausea, vomiting, or heartburn. Denies any history of peptic ulcer disease. Reports a normal colonoscopy 7 years ago in [place]. Cardiology planning heart cath prior to discharge. A transcatheter aortic valve replacement (TAVR) is being considered depending on anemia workup findings. Currently sitting in a chair and reports that he feels a lot stronger since admit.    Past Medical History  Diagnosis Date  . Rheumatoid arthritis(714.0)     infliximab infusions every month   . COPD (chronic obstructive pulmonary disease)   . Elevated liver function tests   . Hypertension   . Diabetes mellitus   . Aortic stenosis     a. 2D ECHO: 01/30/2014: ef 50-55%. No RWMAs. Severe ZO:XWRUE area (VTI): 0.82 cm^2. Valve area (Vmax): 0.74 cm^2. Valve area (Vmean): 0.75 Cm^2. Mod LA/RA dilation. PA peak pressure 33  . CAD (coronary artery disease)   . GERD (gastroesophageal reflux disease)   . Atrial fibrillation   . 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 Degenerative disc disease, cervical   . Degenerative disc disease, lumbar 2010  . Morbid obesity   . Coronary atherosclerosis of artery bypass graft 2010    Past Surgical History  Procedure Laterality Date  . Coronary stent placement  01/02/2009    s/p bare metal stent to the vein graft to the  RCA 9/20. nml LV function   . Appendectomy    . Coronary artery bypass graft  03/27/1997    CABG x 5 by Dr Cornelius Moras  . Anterior cervical decompression      with posterior decompression and fusion   . Tonsillectomy and adenoidectomy      Prior to Admission medications   Medication Sig Start Date End Date Taking? Authorizing Provider  albuterol-ipratropium (COMBIVENT) 18-103 MCG/ACT inhaler Inhale 2 puffs into the lungs every 6 (six) hours as needed for wheezing or shortness of breath.    Yes Historical Provider, MD  ascorbic acid (VITAMIN C) 500 MG tablet Take 500 mg by mouth daily.    Yes Historical Provider, MD  budesonide (PULMICORT) 0.25 MG/2ML nebulizer solution Take 0.25 mg by nebulization 2 (two) times daily.   Yes Historical Provider, MD  cetirizine (ZYRTEC) 10 MG tablet Take 10 mg by mouth at bedtime.   Yes Historical Provider, MD  clopidogrel (PLAVIX) 75 MG tablet Take 75 mg by mouth daily.     Yes Historical Provider, MD  co-enzyme Q-10 30 MG capsule Take 30 mg by mouth at bedtime.    Yes Historical Provider, MD  desonide (DESOWEN) 0.05 % cream Apply 1 application topically 2 (two) times daily.   Yes Historical Provider, MD  diltiazem (CARDIZEM) 30 MG tablet Take 1 tablet (30 mg total) by mouth 2 (two) times daily. 10/09/13  Yes Laqueta Linden, MD  ezetimibe (ZETIA) 10 MG tablet Take 10 mg by  mouth at bedtime.    Yes Historical Provider, MD  formoterol (PERFOROMIST) 20 MCG/2ML nebulizer solution Take 2 mLs (20 mcg total) by nebulization 2 (two) times daily. Use in nebulizer twice daily perfectly regularly 03/21/13  Yes Nyoka Cowden, MD  furosemide (LASIX) 40 MG tablet Take 1 tablet (40 mg total) by mouth daily. 10/09/13  Yes Laqueta Linden, MD  guaifenesin (ROBITUSSIN) 100 MG/5ML syrup Take 200 mg by mouth 3 (three) times daily as needed for cough.   Yes Historical Provider, MD  insulin NPH-insulin regular (NOVOLIN 70/30) (70-30) 100 UNIT/ML injection Inject 80 Units into the  skin 2 (two) times daily.    Yes Historical Provider, MD  ipratropium-albuterol (DUONEB) 0.5-2.5 (3) MG/3ML SOLN Take 3 mLs by nebulization 4 (four) times daily as needed (for shortness of breath).    Yes Historical Provider, MD  ketoconazole (NIZORAL) 2 % cream Apply 1 application topically 2 (two) times daily as needed for irritation.    Yes Historical Provider, MD  loratadine (CLARITIN) 10 MG tablet Take 10 mg by mouth every morning.    Yes Historical Provider, MD  losartan (COZAAR) 100 MG tablet Take 50 mg by mouth at bedtime.    Yes Historical Provider, MD  metFORMIN (GLUCOPHAGE) 1000 MG tablet Take 1,000 mg by mouth 2 (two) times daily.     Yes Historical Provider, MD  metoprolol (TOPROL-XL) 50 MG 24 hr tablet Take 50 mg by mouth 2 (two) times daily.     Yes Historical Provider, MD  Multiple Vitamin (MULTIVITAMIN) tablet Take 1 tablet by mouth daily.     Yes Historical Provider, MD  oxyCODONE-acetaminophen (PERCOCET) 5-325 MG per tablet Take 1 tablet by mouth every 4 (four) hours as needed for moderate pain.    Yes Historical Provider, MD  potassium chloride SA (KLOR-CON M20) 20 MEQ tablet Take 1 tablet (20 mEq total) by mouth daily. 10/09/13  Yes Laqueta Linden, MD  predniSONE (DELTASONE) 5 MG tablet Take 5 mg by mouth daily.    Yes Historical Provider, MD  rivaroxaban (XARELTO) 20 MG TABS tablet Take 1 tablet (20 mg total) by mouth daily with supper. 12/05/13  Yes Tonny Bollman, MD    Scheduled Meds: . arformoterol  15 mcg Nebulization Q12H  . budesonide  0.25 mg Nebulization BID  . Chlorhexidine Gluconate Cloth  6 each Topical Q0600  . diltiazem  30 mg Oral BID  . ezetimibe  10 mg Oral QHS  . insulin aspart  0-9 Units Subcutaneous TID WC  . insulin glargine  60 Units Subcutaneous QHS  . levofloxacin (LEVAQUIN) IV  750 mg Intravenous Q24H  . loratadine  10 mg Oral Daily  . losartan  25 mg Oral Daily  . metoprolol succinate  25 mg Oral BID  . mupirocin ointment  1 application Nasal  BID  . polyethylene glycol-electrolytes  4,000 mL Oral Once  . potassium chloride SA  20 mEq Oral Daily  . predniSONE  5 mg Oral Daily  . sodium chloride  3 mL Intravenous Q12H   Continuous Infusions: . heparin 1,500 Units/hr (01/31/14 1433)   PRN Meds:.sodium chloride, acetaminophen, guaifenesin, ipratropium-albuterol, ketoconazole, ondansetron (ZOFRAN) IV, oxyCODONE-acetaminophen, sodium chloride  Allergies as of 01/29/2014  . (No Known Allergies)    Family History  Problem Relation Age of Onset  . Hypertension Mother   . Cancer Father   . Stroke Brother   . Liver cancer Paternal Grandfather   . Rheum arthritis Maternal Aunt   . Asthma Child   .  Emphysema Brother     smoked    History   Social History  . Marital Status: Married    Spouse Name: N/A    Number of Children: N/A  . Years of Education: N/A   Occupational History  . Retired Special educational needs teacher    Social History Main Topics  . Smoking status: Former Smoker -- 3.00 packs/day for 35 years    Types: Cigarettes    Start date: 09/15/1961    Quit date: 11/23/1996  . Smokeless tobacco: Current User    Types: Chew     Comment: started chewing in 2001/chews 2-3 packs per week  . Alcohol Use: No  . Drug Use: No  . Sexual Activity: Not on file   Other Topics Concern  . Not on file   Social History Narrative   Married, retired.     Review of Systems: All negative from GI standpoint except as stated above in HPI.  Physical Exam: Vital signs: Filed Vitals:   01/31/14 1247  BP: 117/54  Pulse: 45  Temp: 97.9  Resp: 21     General:   Alert,  Obese, no acute distress HEENT: anicteric Lungs:  Clear throughout to auscultation anteriorly Heart:  Regular rate and rhythm Abdomen: soft, nontender, nondistended, +BS Rectal:  Deferred Ext: 3+ pitting LE edema  GI:  Lab Results:  Recent Labs  01/31/14 1230  WBC 8.4  HGB 10.1*  HCT 32.2*  PLT 147*   BMET  Recent Labs  01/30/14 0257 01/31/14 0345  NA  134* 135*  K 4.3 3.8  CL 99 97  CO2 26 28  GLUCOSE 178* 140*  BUN 13 14  CREATININE 0.85 0.93  CALCIUM 8.5 8.2*   LFT No results found for this basename: PROT, ALBUMIN, AST, ALT, ALKPHOS, BILITOT, BILIDIR, IBILI,  in the last 72 hours PT/INR No results found for this basename: LABPROT, INR,  in the last 72 hours   Studies/Results: Dg Chest Port 1v Same Day  01/30/2014   CLINICAL DATA:  CHF.  EXAM: PORTABLE CHEST - 1 VIEW SAME DAY  COMPARISON:  CT and chest radiograph, 01/28/2014  FINDINGS: Changes from CABG surgery are stable. Cardiac silhouette is normal in size and configuration. No convincing mediastinal or hilar masses. There is mild vascular congestion. Right basilar opacity likely combination of a small effusion and atelectasis. No overt pulmonary edema or convincing infiltrate. No pneumothorax.  IMPRESSION: 1. Mild vascular congestion without overt pulmonary edema. No convincing pneumonia. Small effusion with right lung base atelectasis.   Electronically Signed   By: Amie Portland M.D.   On: 01/30/2014 12:53    Impression/Plan: 68 yo with CHF, severe aortic stenosis (AS) and anemia on IV Heparin infusion and in need of further workup/treatment of his AS prior to discharge. Will do EGD/colonoscopy tomorrow with Diprivan sedation. Colon prep this evening. NPO p MN. Patient agreeable to proceed.    LOS: 2 days   Catherine Cubero C.  01/31/2014, 4:30 PM

## 2014-01-31 NOTE — Progress Notes (Signed)
Inpatient Diabetes Program Recommendations  AACE/ADA: New Consensus Statement on Inpatient Glycemic Control (2013)  Target Ranges:  Prepandial:   less than 140 mg/dL      Peak postprandial:   less than 180 mg/dL (1-2 hours)      Critically ill patients:  140 - 180 mg/dL   Reason for Assessment:  Results for [name], OSTROW (MRN [id number]) as of 01/31/2014 11:12  Ref. Range 01/30/2014 12:04 01/30/2014 16:23 01/30/2014 19:09 01/30/2014 21:20 01/31/2014 07:19  Glucose-Capillary Latest Range: 70-99 mg/dL 267 (H) 124 (H) 580 (H) 172 (H) 96    Fasting CBG=96 mg/dL this morning.  May consider adding Novolog meal coverage 5 units tid with meals-Hold if patient eats less than 50%-(To cover meal intake).  Thanks, Beryl Meager, RN, BC-ADM Inpatient Diabetes Coordinator Pager [phone]

## 2014-01-31 NOTE — Progress Notes (Signed)
Tech offered assistance with bath. Family member at bedside and stated she will assist Pt with bath. Bath supplies given to Pt.

## 2014-01-31 NOTE — Progress Notes (Addendum)
Still awaiting GI opinion.  Has severe AS and chronic diastolic CHF that is secondary. Plan continue IV diuresis an additional day. May be a TAVR candidate but need to determine if high risk for bleeding with anticoagulation and be sure there is no cancer or other problems.  Will check hemoglobin today.  Cardiac cath when more GI intelligence and CHF is as compensated as possible.  Will do lower extremity doppler due to asymmetric edema. Xarelto was held in anticipation of GI. I will go ahead and start IV heparin. Don't want to allow DVT.

## 2014-01-31 NOTE — Progress Notes (Addendum)
ANTICOAGULATION CONSULT NOTE - Initial Consult  Pharmacy Consult for Heparin Indication: atrial fibrillation  No Known Allergies  Patient Measurements: Height: 5\' 11"  (180.3 cm) Weight: 302 lb 6.4 oz (137.168 kg) IBW/kg (Calculated) : 75.3 Heparin Dosing Weight:  107 kg  Vital Signs: Temp: 97.9 F (36.6 C) (10/01 0720) Temp src: Oral (10/01 0720) BP: 93/50 mmHg (10/01 0720) Pulse Rate: 67 (10/01 0720)  Labs:  Recent Labs  01/30/14 0257 01/31/14 0345  CREATININE 0.85 0.93    Estimated Creatinine Clearance: 106.1 ml/min (by C-G formula based on Cr of 0.93).   Medical History: Past Medical History  Diagnosis Date  . Rheumatoid arthritis(714.0)     infliximab infusions every month   . COPD (chronic obstructive pulmonary disease)   . Elevated liver function tests   . Hypertension   . Diabetes mellitus   . Aortic stenosis     a. 2D ECHO: 01/30/2014: ef 50-55%. No RWMAs. Severe 02/01/2014 area (VTI): 0.82 cm^2. Valve area (Vmax): 0.74 cm^2. Valve area (Vmean): 0.75 Cm^2. Mod LA/RA dilation. PA peak pressure 33  . CAD (coronary artery disease)   . GERD (gastroesophageal reflux disease)   . Atrial fibrillation   . 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 Degenerative disc disease, cervical   . Degenerative disc disease, lumbar 2010  . Morbid obesity   . Coronary atherosclerosis of artery bypass graft 2010    Assessment: Admit Complaint: Dyspnea, weight gain (20lbs in 3 wks), recent anemia.  Anticoagulation: CT negative for PE at [facility]. Afib/flutter on Xarelto. Scr 0.93. CrCl >100. Last dose midnight 9/30. Cardiology MD would like to start IV heparin today in preparation for cath, GI and TAVR w/u.  Goal of Therapy:  Heparin level 0.3-0.7 units/ml APTT 66-102 Monitor platelets by anticoagulation protocol: Yes   Plan:  Start IV heparin 4000 unit bolus and infusion at 1500  units/hr Check aPTT and HL in 6-8 hrs and daily.   Tiffanni Scarfo S. 10/30, PharmD, BCPS Clinical Staff Pharmacist Pager [phone]  [phone] [place] 01/31/2014,1:15 PM

## 2014-01-31 NOTE — Consult Note (Addendum)
[address]       [name] [address]             [phone]          CARDIOTHORACIC SURGERY CONSULTATION REPORT  PCP is Donzetta Sprung, MD Referring Provider is Laqueta Linden, MD   Reason for consultation:  Severe aortic stenosis  HPI:  Patient is a 68 year old morbidly obese white male from [place] with complex past medical history and multiple medical problems who has been referred to discuss possible treatment options for severe symptomatic aortic stenosis. The patient has a long-standing cardiac history dating back to 1998 when he presented with symptoms of chest pain consistent with angina pectoris. He was found to have three-vessel coronary artery disease and he subsequently underwent coronary artery bypass grafting x5 on 03/27/1997.  Grafts placed at the time of surgery included left internal mammary artery to distal left anterior descending coronary artery, saphenous vein graft to the diagonal branch, sequential saphenous vein grafts to the first and second obtuse marginal branches of left circumflex coronary artery, and saphenous vein graft to the distal right coronary artery.  His postoperative recovery was uncomplicated and he subsequently did well from a cardiac standpoint until 2010. 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 Repeat catheterization performed at that time demonstrated that the majority of his vein grafts remained patent although there was high-grade segmental stenosis in the midportion of the vein graft to right coronary artery. There had been some progression of native coronary artery disease and there was some disease within the terminal branches of the left circumflex system as well. The patient was treated with PCI and stenting of the vein graft to right coronary artery using a bare-metal  stent.  Since then the patient was followed by Dr Earnestine Leys for several years and remained stable from a cardiac standpoint, although he developed progressive exertional shortness of breath.  This was attributed to his underlying severe COPD, for which he is now dependent on oxygen therapy at night. More recently he has been followed by Dr. Purvis Sheffield who noted a systolic murmur on physical exam when he first examined the patient in 2014.  Echocardiogram performed at that time demonstrated the presence of moderate aortic stenosis which has progressed in severity on subsequent followup exams.  He also developed paroxysmal atrial flutter which has become persistent more recently.   He was recently placed on Xarelto for long term anticoagulation.  More recently the patient's shortness of breath has continued to get worse, and he was referred to Dr. Excell Seltzer several months ago to discuss treatment options for management of severe aortic stenosis.  At that time the patient wanted to contemplate matters further before pursuing any further diagnostic workup.  Over the past month the patient experienced a fairly rapid progression of worsening shortness of breath, increasing lower extremity edema, and weight gain. He ultimately presented to his primary care physician's office early this week in respiratory distress. He was sent directly to the emergency department and hospitalized for acute exacerbation of chronic diastolic congestive heart failure. He was noted to be severely anemic and transfused 1 unit packed red blood cells. He was transferred to [facility] for further therapy.  Subsequent to hospitalization the patient's breathing has improved with transfusion and diuretic therapy.  He denies any history of chest pain or chest tightness either  with activity or at rest. He has not had PND, dizzy spells, nor syncope. He has long-standing severe exertional shortness of breath, orthopnea, and lower extremity edema, all of which  became acutely worse immediately prior to admission. At the time of admission he had severe resting shortness of breath.    The patient is married and lives with his wife in [place], [place]. 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 He has severe chronic pain in his back and hips which limit his mobility considerably. He can only walk short distances using a cane.  He uses oxygen at night to sleep.  Past Medical History  Diagnosis Date  . Rheumatoid arthritis(714.0)     infliximab infusions every month   . COPD (chronic obstructive pulmonary disease)   . Elevated liver function tests   . Hypertension   . Diabetes mellitus   . Aortic stenosis     a. 2D ECHO: 01/30/2014: ef 50-55%. No RWMAs. Severe UE:AVWUJ area (VTI): 0.82 cm^2. Valve area (Vmax): 0.74 cm^2. Valve area (Vmean): 0.75 Cm^2. Mod LA/RA dilation. PA peak pressure 33  . CAD (coronary artery disease)   . GERD (gastroesophageal reflux disease)   . Atrial fibrillation   . 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 Degenerative disc disease, cervical   . Degenerative disc disease, lumbar 2010  . Morbid obesity   . Coronary atherosclerosis of artery bypass graft 2010    Past Surgical History  Procedure Laterality Date  . Coronary stent placement  01/02/2009    s/p bare metal stent to the vein graft to the RCA 9/20. nml LV function   . Appendectomy    . Coronary artery bypass graft  03/27/1997    CABG x 5 by Dr Cornelius Moras  . Anterior cervical decompression      with posterior decompression and fusion   . Tonsillectomy and adenoidectomy      Family History  Problem Relation Age of Onset  . Hypertension Mother   . Cancer Father   . Stroke Brother   . Liver cancer Paternal Grandfather    . Rheum arthritis Maternal Aunt   . Asthma Child   . Emphysema Brother     smoked    History   Social History  . Marital Status: Married    Spouse Name: N/A    Number of Children: N/A  . Years of Education: N/A   Occupational History  . Retired Special educational needs teacher    Social History Main Topics  . Smoking status: Former Smoker -- 3.00 packs/day for 35 years    Types: Cigarettes    Start date: 09/15/1961    Quit date: 11/23/1996  . Smokeless tobacco: Current User    Types: Chew     Comment: started chewing in 2001/chews 2-3 packs per week  . Alcohol Use: No  . Drug Use: No  . Sexual Activity: Not on file   Other Topics Concern  . Not on file   Social History Narrative   Married, retired.     Prior to Admission medications   Medication Sig Start Date End Date Taking? Authorizing Provider  albuterol-ipratropium (COMBIVENT) 18-103 MCG/ACT inhaler Inhale 2 puffs into the lungs every 6 (six) hours  as needed for wheezing or shortness of breath.    Yes Historical Provider, MD  ascorbic acid (VITAMIN C) 500 MG tablet Take 500 mg by mouth daily.    Yes Historical Provider, MD  budesonide (PULMICORT) 0.25 MG/2ML nebulizer solution Take 0.25 mg by nebulization 2 (two) times daily.   Yes Historical Provider, MD  cetirizine (ZYRTEC) 10 MG tablet Take 10 mg by mouth at bedtime.   Yes Historical Provider, MD  clopidogrel (PLAVIX) 75 MG tablet Take 75 mg by mouth daily.     Yes Historical Provider, MD  co-enzyme Q-10 30 MG capsule Take 30 mg by mouth at bedtime.    Yes Historical Provider, MD  desonide (DESOWEN) 0.05 % cream Apply 1 application topically 2 (two) times daily.   Yes Historical Provider, MD  diltiazem (CARDIZEM) 30 MG tablet Take 1 tablet (30 mg total) by mouth 2 (two) times daily. 10/09/13  Yes Laqueta Linden, MD  ezetimibe (ZETIA) 10 MG tablet Take 10 mg by mouth at bedtime.    Yes Historical Provider, MD  formoterol (PERFOROMIST) 20 MCG/2ML nebulizer solution Take 2 mLs (20  mcg total) by nebulization 2 (two) times daily. Use in nebulizer twice daily perfectly regularly 03/21/13  Yes Nyoka Cowden, MD  furosemide (LASIX) 40 MG tablet Take 1 tablet (40 mg total) by mouth daily. 10/09/13  Yes Laqueta Linden, MD  guaifenesin (ROBITUSSIN) 100 MG/5ML syrup Take 200 mg by mouth 3 (three) times daily as needed for cough.   Yes Historical Provider, MD  insulin NPH-insulin regular (NOVOLIN 70/30) (70-30) 100 UNIT/ML injection Inject 80 Units into the skin 2 (two) times daily.    Yes Historical Provider, MD  ipratropium-albuterol (DUONEB) 0.5-2.5 (3) MG/3ML SOLN Take 3 mLs by nebulization 4 (four) times daily as needed (for shortness of breath).    Yes Historical Provider, MD  ketoconazole (NIZORAL) 2 % cream Apply 1 application topically 2 (two) times daily as needed for irritation.    Yes Historical Provider, MD  loratadine (CLARITIN) 10 MG tablet Take 10 mg by mouth every morning.    Yes Historical Provider, MD  losartan (COZAAR) 100 MG tablet Take 50 mg by mouth at bedtime.    Yes Historical Provider, MD  metFORMIN (GLUCOPHAGE) 1000 MG tablet Take 1,000 mg by mouth 2 (two) times daily.     Yes Historical Provider, MD  metoprolol (TOPROL-XL) 50 MG 24 hr tablet Take 50 mg by mouth 2 (two) times daily.     Yes Historical Provider, MD  Multiple Vitamin (MULTIVITAMIN) tablet Take 1 tablet by mouth daily.     Yes Historical Provider, MD  oxyCODONE-acetaminophen (PERCOCET) 5-325 MG per tablet Take 1 tablet by mouth every 4 (four) hours as needed for moderate pain.    Yes Historical Provider, MD  potassium chloride SA (KLOR-CON M20) 20 MEQ tablet Take 1 tablet (20 mEq total) by mouth daily. 10/09/13  Yes Laqueta Linden, MD  predniSONE (DELTASONE) 5 MG tablet Take 5 mg by mouth daily.    Yes Historical Provider, MD  rivaroxaban (XARELTO) 20 MG TABS tablet Take 1 tablet (20 mg total) by mouth daily with supper. 12/05/13  Yes Tonny Bollman, MD    Current Facility-Administered  Medications  Medication Dose Route Frequency Provider Last Rate Last Dose  . 0.9 %  sodium chloride infusion  250 mL Intravenous PRN Rollene Rotunda, MD      . acetaminophen (TYLENOL) tablet 650 mg  650 mg Oral Q4H PRN Rollene Rotunda, MD  650 mg at 01/31/14 0253  . arformoterol (BROVANA) nebulizer solution 15 mcg  15 mcg Nebulization Q12H Rollene Rotunda, MD      . budesonide (PULMICORT) nebulizer solution 0.25 mg  0.25 mg Nebulization BID Rollene Rotunda, MD      . Chlorhexidine Gluconate Cloth 2 % PADS 6 each  6 each Topical Q0600 Rollene Rotunda, MD   6 each at 01/31/14 1206  . diltiazem (CARDIZEM) tablet 30 mg  30 mg Oral BID Rollene Rotunda, MD   30 mg at 01/31/14 0958  . ezetimibe (ZETIA) tablet 10 mg  10 mg Oral QHS Rollene Rotunda, MD   10 mg at 01/30/14 2120  . guaifenesin (ROBITUSSIN) 100 MG/5ML syrup 200 mg  200 mg Oral TID PRN Rollene Rotunda, MD   200 mg at 01/30/14 0056  . heparin ADULT infusion 100 units/mL (25000 units/250 mL)  1,500 Units/hr Intravenous Continuous Shirley Friar, MD 15 mL/hr at 01/31/14 1433 1,500 Units/hr at 01/31/14 1433  . insulin aspart (novoLOG) injection 0-9 Units  0-9 Units Subcutaneous TID WC Rollene Rotunda, MD   3 Units at 01/31/14 1659  . insulin glargine (LANTUS) injection 60 Units  60 Units Subcutaneous QHS Azalee Course, [place]   60 Units at 01/30/14 2120  . ipratropium-albuterol (DUONEB) 0.5-2.5 (3) MG/3ML nebulizer solution 3 mL  3 mL Nebulization QID PRN Rollene Rotunda, MD   3 mL at 01/31/14 0809  . ketoconazole (NIZORAL) 2 % cream 1 application  1 application Topical Daily PRN Rollene Rotunda, MD      . levofloxacin (LEVAQUIN) IVPB 750 mg  750 mg Intravenous Q24H Rollene Rotunda, MD   750 mg at 01/30/14 2308  . loratadine (CLARITIN) tablet 10 mg  10 mg Oral Daily Rollene Rotunda, MD   10 mg at 01/31/14 0958  . losartan (COZAAR) tablet 25 mg  25 mg Oral Daily Rollene Rotunda, MD   25 mg at 01/30/14 1051  . metoprolol succinate (TOPROL-XL) 24 hr tablet 25 mg  25  mg Oral BID Rollene Rotunda, MD   25 mg at 01/30/14 2120  . mupirocin ointment (BACTROBAN) 2 % 1 application  1 application Nasal BID Rollene Rotunda, MD   1 application at 01/31/14 1206  . ondansetron (ZOFRAN) injection 4 mg  4 mg Intravenous Q6H PRN Rollene Rotunda, MD      . oxyCODONE-acetaminophen (PERCOCET/ROXICET) 5-325 MG per tablet 1 tablet  1 tablet Oral Q4H PRN Rollene Rotunda, MD   1 tablet at 01/31/14 1147  . polyethylene glycol-electrolytes (NuLYTELY/GoLYTELY) solution 4,000 mL  4,000 mL Oral Once Shirley Friar, MD   4,000 mL at 01/31/14 1733  . potassium chloride SA (K-DUR,KLOR-CON) CR tablet 20 mEq  20 mEq Oral Daily Rollene Rotunda, MD   20 mEq at 01/31/14 0958  . predniSONE (DELTASONE) tablet 5 mg  5 mg Oral Daily Rollene Rotunda, MD   5 mg at 01/31/14 0958  . sodium chloride 0.9 % injection 3 mL  3 mL Intravenous Q12H Rollene Rotunda, MD   3 mL at 01/31/14 0959  . sodium chloride 0.9 % injection 3 mL  3 mL Intravenous PRN Rollene Rotunda, MD        No Known Allergies    Review of Systems:   General:  normal appetite, normal energy, + weight gain, no weight loss, no fever  Cardiac:  no chest pain with exertion, no chest pain at rest, + SOB with exertion, + resting SOB, no PND, + orthopnea, no palpitations, + arrhythmia, + atrial  fibrillation, + LE edema, no dizzy spells, no syncope  Respiratory:  + shortness of breath, + home oxygen, intermittent chronic productive cough, + dry cough, no bronchitis, + wheezing, no hemoptysis, no asthma, no pain with inspiration or cough, no sleep apnea, no CPAP at night  GI:   no difficulty swallowing, no reflux, no frequent heartburn, no hiatal hernia, no abdominal pain, no constipation, no diarrhea, no hematochezia, no hematemesis, no melena  GU:   no dysuria,  no frequency, no urinary tract infection, no hematuria, no enlarged prostate, no kidney stones, no kidney disease  Vascular:  no pain suggestive of claudication, no pain in feet, no leg  cramps, no varicose veins, no DVT, no non-healing foot ulcer  Neuro:   no stroke, no TIA's, no seizures, no headaches, no temporary blindness one eye,  no slurred speech, no peripheral neuropathy, + chronic pain, + instability of gait, no memory/cognitive dysfunction  Musculoskeletal: + severe arthritis, + joint swelling, no myalgias, + difficulty walking, limited mobility   Skin:   no rash, no itching, no skin infections, no pressure sores or ulcerations  Psych:   no anxiety, no depression, no nervousness, no unusual recent stress  Eyes:   no blurry vision, no floaters, no recent vision changes, + wears glasses or contacts  ENT:   no hearing loss, + loose or painful teeth, no dentures, last saw dentist recently in [place] system  Hematologic:  no easy bruising, + abnormal bleeding, no clotting disorder, + recent frequent epistaxis since starting Xarelto  Endocrine:  + diabetes, does not check CBG's at home regularly     Physical Exam:   BP 101/57  Pulse 47  Temp(Src) 97.9 F (36.6 C) (Oral)  Resp 19  Ht 5\' 11"  (1.803 m)  Wt 137.168 kg (302 lb 6.4 oz)  BMI 42.20 kg/m2  SpO2 95%  General:  Morbidly obese, chronically ill-appearing  HEENT:  Unremarkable except for poor dentition  Neck:   no JVD, no bruits, no adenopathy   Chest:   clear to auscultation, symmetrical breath sounds, no wheezes, no rhonchi   CV:   RRR, grade III/VI crescendo/decrescendo systolic murmur   Abdomen:  soft, non-tender, no masses   Extremities:  warm, well-perfused, pulses not palpable, + severe bilateral lower extremity edema  Rectal/GU  Deferred  Neuro:   Grossly non-focal and symmetrical throughout  Skin:   Clean and dry, no rashes, no breakdown  Diagnostic Tests:   Transthoracic Echocardiography  Patient: [name], [name] MR #: [id number] Study Date: 02/15/2013 Gender: M Age: 2 Height: 180.3cm Weight: 129.3kg BSA: 2.31m^2 Pt. Status: Room:  SONOGRAPHER Missy Al-Rammal, RVT, RDCS PERFORMING  [place], [place] ATTENDING Prentice Docker, MD ORDERING Prentice Docker, MD REFERRING Prentice Docker, MD cc:  ------------------------------------------------------------ LV EF: 60% - 65%  ------------------------------------------------------------ History: PMH: Acquired from the patient and from the patient's chart. Dyspnea, exertional dyspnea, and systolic murmur. Coronary artery disease. Chronic obstructive pulmonary disease. Risk factors: Hypertension.  ------------------------------------------------------------ Study Conclusions  - Left ventricle: The cavity size was normal. There was mild concentric hypertrophy. Systolic function was normal. The estimated ejection fraction was in the range of 60% to 65%. Wall motion was normal; there were no regional wall motion abnormalities. There was an increased relative contribution of atrial contraction to ventricular filling. Doppler parameters are consistent with abnormal left ventricular relaxation (grade 1 diastolic dysfunction). - Aortic valve: Trileaflet; moderately thickened, moderately calcified leaflets. Valve mobility was restricted. There wasmoderate stenosis. Peak velocity: 375cm/s (S). Mean gradient: 64mm Hg (  S). Valve area: 0.9cm^2(VTI). Valve area: 0.83cm^2 (Vmax). - Left atrium: The atrium was mildly dilated.  ------------------------------------------------------------ Labs, prior tests, procedures, and surgery: Coronary artery bypass grafting.  Transthoracic echocardiography. M-mode, complete 2D, spectral Doppler, and color Doppler. Height: Height: 180.3cm. Height: 71in. Weight: Weight: 129.3kg. Weight: 284.4lb. Body mass index: BMI: 39.7kg/m^2. Body surface area: BSA: 2.70m^2. Blood pressure: 100/60. Patient status: Outpatient.  ------------------------------------------------------------  ------------------------------------------------------------ Left ventricle: The cavity size was normal. There  was mild concentric hypertrophy. Systolic function was normal. The estimated ejection fraction was in the range of 60% to 65%. Wall motion was normal; there were no regional wall motion abnormalities. There was an increased relative contribution of atrial contraction to ventricular filling. Doppler parameters are consistent with abnormal left ventricular relaxation (grade 1 diastolic dysfunction). There was no evidence of elevated ventricular filling pressure by Doppler parameters.  ------------------------------------------------------------ Aortic valve: Poorly visualized. Trileaflet; moderately thickened, moderately calcified leaflets. Valve mobility was restricted. Doppler: There was moderate stenosis. VTI ratio of LVOT to aortic valve: 0.32. Valve area: 0.9cm^2(VTI). Indexed valve area: 0.37cm^2/m^2 (VTI). Peak velocity ratio of LVOT to aortic valve: 0.29. Valve area: 0.83cm^2 (Vmax). Mean gradient: 53mm Hg (S). Peak gradient: 67mm Hg (S).  ------------------------------------------------------------ Mitral valve: Mildly thickened leaflets . Doppler: Peak gradient: 72mm Hg (D).  ------------------------------------------------------------ Left atrium: The atrium was mildly dilated.  ------------------------------------------------------------ Atrial septum: Poorly visualized.  ------------------------------------------------------------ Right ventricle: The cavity size was normal. Wall thickness was normal. Systolic function was normal.  ------------------------------------------------------------ Pulmonic valve: Poorly visualized.  ------------------------------------------------------------ Tricuspid valve: Structurally normal valve. Leaflet separation was normal. Doppler: Transvalvular velocity was within the normal range. No regurgitation.  ------------------------------------------------------------ Right atrium: The atrium was normal in  size.  ------------------------------------------------------------ Pericardium: There was no pericardial effusion.  ------------------------------------------------------------ Systemic veins: Inferior vena cava: The vessel was normal in size; the respirophasic diameter changes were in the normal range (= 50%); findings are consistent with normal central venous pressure.  ------------------------------------------------------------  2D measurements Normal Doppler measurements Normal Left ventricle LVOT LVID ED, 49.3 mm 43-52 Peak vel, 110 cm/s ------ chord, S PLAX VTI, S 23.8 cm ------ LVID ES, 31 mm 23-38 Aortic valve chord, Peak vel, 375 cm/s ------ PLAX S FS, chord, 37 % >29 Mean vel, 267 cm/s ------ PLAX S LVPW, ED 13 mm ------ VTI, S 74.7 cm ------ IVS/LVPW 1 <1.3 Mean 33 mm Hg ------ ratio, ED gradient, Ventricular septum S IVS, ED 13 mm ------ Peak 56 mm Hg ------ LVOT gradient, Diam, S 19 mm ------ S Area 2.84 cm^2 ------ VTI ratio 0.32 ------ Aorta LVOT/AV Root diam, 31 mm ------ Area, VTI 0.9 cm^2 ------ ED Area index 0.37 cm^2/m ------ Left atrium (VTI) ^2 AP dim 38 mm ------ Peak vel 0.29 ------ AP dim 1.55 cm/m^2 <2.2 ratio, index LVOT/AV Vol, S 56 ml ------ Area, Vmax 0.83 cm^2 ------ Vol index, 22.9 ml/m^2 ------ Mitral valve S Peak E vel 71.3 cm/s ------ Peak A vel 105 cm/s ------ Decelerati 317 ms 150-23 on time 0 Peak 2 mm Hg ------ gradient, D Peak E/A 0.7 ------ ratio Tricuspid valve Regurg 115 cm/s ------ peak vel Peak RV-RA 5 mm Hg ------ gradient, S Pulmonic valve Peak vel, 150 cm/s ------ S  ------------------------------------------------------------ Prepared and Electronically Authenticated by  Prentice Docker, MD [id number]          Transthoracic Echocardiography  Patient: [name], [name] MR #: [id number] Study Date: 09/14/2013 Gender: M Age: 36 Height: 180.3cm Weight: 129.3kg BSA:  2.79m^2 Pt. Status: Room:  SONOGRAPHER Mechele Collin,  [place] ATTENDING Prentice Docker, MD ORDERING Prentice Docker, MD REFERRING Prentice Docker, MD PERFORMING [place] cc:  ------------------------------------------------------------ LV EF: 60% - 65%  ------------------------------------------------------------ Indications: Aortic stenosis 424.1.  ------------------------------------------------------------ History: PMH: Former Smoker Atrial flutter. Chronic obstructive pulmonary disease. Risk factors: Hypertension. Dyslipidemia.  ------------------------------------------------------------ Study Conclusions  - Left ventricle: The cavity size was normal. Wall thickness was increased in a pattern of mild LVH. Systolic function was normal. The estimated ejection fraction was in the range of 60% to 65%. Cannot exclude hypokinesis of the basalinferior myocardium. The study is not technically sufficient to allow evaluation of LV diastolic function. - Aortic valve: Moderately calcified annulus. Probably trileaflet; severely calcified leaflets. Cusp separation was reduced. There was severe stenosis. No significant regurgitation. Mean gradient: 41mm Hg (S). Peak gradient: 54mm Hg (S). Valve area: 0.97cm^2(VTI). Peak velocity ratio of LVOT to aortic valve: 0.28. Valve area: 0.88cm^2 (Vmax). - Mitral valve: Calcified annulus. Trivial regurgitation. - Left atrium: The atrium was mildly dilated. - Right atrium: The atrium was mildly dilated. Central venous pressure: 3mm Hg (est). - Tricuspid valve: Trivial regurgitation. - Pulmonary arteries: Systolic pressure could not be accurately estimated. - Pericardium, extracardiac: There was no pericardial effusion. Impressions:  - Mild LVH with LVEF 60-65%, cannot exclude basal inferior hypokinesis. Indeterminate diastolic function in the setting of atrial arrhythmia. Mild biatrial enlargement. MAC with trivial mitral  regurgitation. Severe calcific aortic stenosis as outlined above. Gradients have increased somewhat compared to prior study. Unable to assess PASP.  ------------------------------------------------------------ Labs, prior tests, procedures, and surgery: Coronary artery bypass grafting.  Transthoracic echocardiography. M-mode, complete 2D, spectral Doppler, and color Doppler. Height: Height: 180.3cm. Height: 71in. Weight: Weight: 129.3kg. Weight: 284.4lb. Body mass index: BMI: 39.7kg/m^2. Body surface area: BSA: 2.55m^2. Patient status: Outpatient. Location: Echo laboratory.  ------------------------------------------------------------  ------------------------------------------------------------ Left ventricle: The cavity size was normal. Wall thickness was increased in a pattern of mild LVH. Systolic function was normal. The estimated ejection fraction was in the range of 60% to 65%. Regional wall motion abnormalities: Cannot exclude hypokinesis of the basalinferior myocardium. The study is not technically sufficient to allow evaluation of LV diastolic function.  ------------------------------------------------------------ Aortic valve: Moderately calcified annulus. Probably trileaflet; severely calcified leaflets. Cusp separation was reduced. Doppler: There was severe stenosis. No significant regurgitation. Valve area: 0.97cm^2(VTI). Indexed valve area: 0.4cm^2/m^2 (VTI). Peak velocity ratio of LVOT to aortic valve: 0.28. Valve area: 0.88cm^2 (Vmax). Indexed valve area: 0.36cm^2/m^2 (Vmax). Mean gradient: 41mm Hg (S). Peak gradient: 54mm Hg (S).  ------------------------------------------------------------ Aorta: Aortic root: The aortic root was normal in size.  ------------------------------------------------------------ Mitral valve: Calcified annulus. Doppler: Trivial regurgitation. Peak gradient: 4mm Hg  (D).  ------------------------------------------------------------ Left atrium: The atrium was mildly dilated.  ------------------------------------------------------------ Right ventricle: The cavity size was normal. Systolic function was normal.  ------------------------------------------------------------ Pulmonic valve: The valve appears to be grossly normal. Doppler: Physiologic regurgitation.  ------------------------------------------------------------ Tricuspid valve: The valve appears to be grossly normal. Doppler: Trivial regurgitation.  ------------------------------------------------------------ Pulmonary artery: Systolic pressure could not be accurately estimated.  ------------------------------------------------------------ Right atrium: The atrium was mildly dilated.  ------------------------------------------------------------ Pericardium: There was no pericardial effusion.  ------------------------------------------------------------ Systemic veins: Inferior vena cava: The vessel was normal in size; the respirophasic diameter changes were in the normal range (= 50%); findings are consistent with normal central venous pressure.  ------------------------------------------------------------  2D measurements Normal Doppler measurements Normal Left ventricle Left ventricle LVID ED, 37.2 mm 43-52 Ea, lat 12.1 cm/s ------ chord, ann, tiss PLAX DP LVID ES, 28.2 mm 23-38 E/Ea, lat 8.76 ------  chord, ann, tiss PLAX DP FS, chord, 24 % >29 LVOT PLAX Peak vel, 103 cm/s ------ LVPW, ED 15.7 mm ------ S IVS/LVPW 0.96 <1.3 Aortic valve ratio, ED Peak vel, 367 cm/s ------ Ventricular septum S IVS, ED 15.1 mm ------ Mean vel, 301 cm/s ------ LVOT S Diam, S 20 mm ------ VTI, S 84.5 cm ------ Area 3.14 cm^2 ------ Mean 41 mm Hg ------ Aorta gradient, Root diam, 26 mm ------ S ED Peak 54 mm Hg ------ Left atrium gradient, AP dim 47 mm ------ S AP dim 1.92 cm/m^2  <2.2 Area, VTI 0.97 cm^2 ------ index Area index 0.4 cm^2/m ------ (VTI) ^2 Peak vel 0.28 ------ ratio, LVOT/AV Area, Vmax 0.88 cm^2 ------ Area index 0.36 cm^2/m ------ (Vmax) ^2 Mitral valve Peak E vel 106 cm/s ------ Peak A vel 48 cm/s ------ Decelerati 141 ms 150-23 on time 0 Peak 4 mm Hg ------ gradient, D Peak E/A 2.2 ------ ratio Systemic veins Estimated 3 mm Hg ------ CVP  ------------------------------------------------------------ Prepared and Electronically Authenticated by  Nona Dell 2015-05-15T13:[id number]     Transthoracic Echocardiography  Patient: [name], [name] MR #: [id number] Study Date: 01/30/2014 Gender: M Age: 64 Height: 180.3 cm Weight: 137.4 kg BSA: 2.69 m^2 Pt. Status: Room: 3S15C  ADMITTING Cassell Clement ATTENDING Rollene Rotunda SONOGRAPHER Nolon Rod, RDCS ORDERING Verne Carrow REFERRING McAlhany, Christopher PERFORMING [facility], Inpatient  cc:  ------------------------------------------------------------------- LV EF: 50% - 55%  ------------------------------------------------------------------- Indications: Aortic stenosis 424.1.  ------------------------------------------------------------------- History: PMH: Congestive heart failure. Aortic valve disease. Chronic obstructive pulmonary disease. Risk factors: Former tobacco use. Hypertension.  ------------------------------------------------------------------- Study Conclusions  - Left ventricle: The cavity size was normal. Wall thickness was normal. Systolic function was normal. The estimated ejection fraction was in the range of 50% to 55%. Wall motion was normal; there were no regional wall motion abnormalities. - Aortic valve: There was severe stenosis. Valve area (VTI): 0.82 cm^2. Valve area (Vmax): 0.74 cm^2. Valve area (Vmean): 0.75 cm^2. - Left atrium: The atrium was moderately dilated. - Right atrium: The atrium was moderately  dilated. - Pulmonary arteries: PA peak pressure: 33 mm Hg (S).  ------------------------------------------------------------------- Labs, prior tests, procedures, and surgery: Coronary artery bypass grafting.  Transthoracic echocardiography. M-mode, complete 2D, spectral Doppler, and color Doppler. Birthdate: Patient birthdate: October 14, 1945. Age: Patient is 68 yr old. Sex: Gender: male. BMI: 42.3 kg/m^2. Blood pressure: 104/59 Patient status: Inpatient. Study date: Study date: 01/30/2014. Study time: 03:09 PM. Location: ICU/CCU  -------------------------------------------------------------------  ------------------------------------------------------------------- Left ventricle: The cavity size was normal. Wall thickness was normal. Systolic function was normal. The estimated ejection fraction was in the range of 50% to 55%. Wall motion was normal; there were no regional wall motion abnormalities.  ------------------------------------------------------------------- Aortic valve: Moderately thickened, moderately calcified leaflets. Doppler: There was severe stenosis. There was no regurgitation. VTI ratio of LVOT to aortic valve: 0.24. Valve area (VTI): 0.82 cm^2. Indexed valve area (VTI): 0.31 cm^2/m^2. Valve area (Vmax): 0.74 cm^2. Indexed valve area (Vmax): 0.28 cm^2/m^2. Mean velocity ratio of LVOT to aortic valve: 0.22. Valve area (Vmean): 0.75 cm^2. Indexed valve area (Vmean): 0.28 cm^2/m^2. Mean gradient (S): 52 mm Hg. Peak gradient (S): 87 mm Hg.  ------------------------------------------------------------------- Aorta: Aortic root: The aortic root was normal in size. Ascending aorta: The ascending aorta was normal in size.  ------------------------------------------------------------------- Mitral valve: Structurally normal valve. Leaflet separation was normal. Doppler: Transvalvular velocity was within the normal range. There was no evidence for stenosis. There was  trivial regurgitation. Peak gradient (D): 7 mm Hg.  -------------------------------------------------------------------  Left atrium: The atrium was moderately dilated.  ------------------------------------------------------------------- Right ventricle: The cavity size was normal. Systolic function was normal.  ------------------------------------------------------------------- Pulmonic valve: The valve appears to be grossly normal. Doppler: There was no significant regurgitation.  ------------------------------------------------------------------- Tricuspid valve: Structurally normal valve. Leaflet separation was normal. Doppler: Transvalvular velocity was within the normal range. There was trivial regurgitation.  ------------------------------------------------------------------- Pulmonary artery: Systolic pressure was within the normal range.  ------------------------------------------------------------------- Right atrium: The atrium was moderately dilated.  ------------------------------------------------------------------- Pericardium: There was no pericardial effusion.  ------------------------------------------------------------------- Measurements  Left ventricle Value Reference LV ID, ED, PLAX chordal 50.9 mm 43 - 52 LV ID, ES, PLAX chordal (H) 41.1 mm 23 - 38 LV fx shortening, PLAX chordal (L) 19 % >=29 LV PW thickness, ED 10.3 mm --------- IVS/LV PW ratio, ED 1.16 <=1.3 Stroke volume, 2D 72 ml --------- Stroke volume/bsa, 2D 27 ml/m^2 --------- LV e&', lateral 10.9 cm/s --------- LV E/e&', lateral 12.02 --------- LV e&', medial 8.44 cm/s --------- LV E/e&', medial 15.52 --------- LV e&', average 9.67 cm/s --------- LV E/e&', average 13.55 ---------  Ventricular septum Value Reference IVS thickness, ED 11.9 mm ---------  LVOT Value Reference LVOT ID, S 21 mm --------- LVOT area 3.46 cm^2 --------- LVOT peak velocity, S 99.4 cm/s --------- LVOT mean  velocity, S 72.8 cm/s --------- LVOT VTI, S 20.9 cm --------- LVOT peak gradient, S 4 mm Hg ---------  Aortic valve Value Reference Aortic valve mean velocity, S 338 cm/s --------- Aortic valve VTI, S 88 cm --------- Aortic mean gradient, S 52 mm Hg --------- Aortic peak gradient, S 87 mm Hg --------- VTI ratio, LVOT/AV 0.24 --------- Aortic valve area, VTI 0.82 cm^2 --------- Aortic valve area/bsa, VTI 0.31 cm^2/m^2 --------- Aortic valve area, peak velocity 0.74 cm^2 --------- Aortic valve area/bsa, peak 0.28 cm^2/m^2 --------- velocity Velocity ratio, mean, LVOT/AV 0.22 --------- Aortic valve area, mean velocity 0.75 cm^2 --------- Aortic valve area/bsa, mean 0.28 cm^2/m^2 --------- velocity  Aorta Value Reference Aortic root ID, ED 27 mm ---------  Left atrium Value Reference LA ID, A-P, ES 52 mm --------- LA ID/bsa, A-P 1.94 cm/m^2 <=2.2 LA volume, ES, 1-p A4C 76.2 ml --------- LA volume/bsa, ES, 1-p A4C 28.4 ml/m^2 --------- LA volume, ES, 1-p A2C 83.7 ml --------- LA volume/bsa, ES, 1-p A2C 31.2 ml/m^2 ---------  Mitral valve Value Reference Mitral E-wave peak velocity 131 cm/s --------- Mitral A-wave peak velocity 73.8 cm/s --------- Mitral deceleration time (L) 144 ms 150 - 230 Mitral peak gradient, D 7 mm Hg --------- Mitral E/A ratio, peak 1.7 ---------  Pulmonary arteries Value Reference PA pressure, S, DP (H) 33 mm Hg <=30  Tricuspid valve Value Reference Tricuspid regurg peak velocity 248 cm/s --------- Tricuspid peak RV-RA gradient 25 mm Hg ---------  Systemic veins Value Reference Estimated CVP 8 mm Hg ---------  Right ventricle Value Reference RV pressure, S, DP (H) 33 mm Hg <=30  Legend: (L) and (H) mark values outside specified reference range.  ------------------------------------------------------------------- Prepared and Electronically Authenticated by  Kristeen Miss, M.D. 2015-09-30T20:17:35   STS Risk  Calculator  Procedure    Redo CABG + AVR  Risk of Mortality   14.4% Morbidity or Mortality  52.7% Prolonged LOS   34.6% Short LOS    8.4% Permanent Stroke   2.5% Prolonged Vent Support  47.7% DSW Infection    2.6% Renal Failure    20.5% Reoperation    17.6%   Impression:  Patient has stage D severe symptomatic aortic stenosis with preserved left ventricular systolic function.  He describes long-standing symptoms consistent with chronic diastolic congestive heart failure, [place] Heart Association functional class III.  Symptoms of heart failure have become acutely worse over the last few weeks, culminating in his current hospitalization with acute exacerbation of class IV CHF, although this occurred in the setting of severe anemia that has developed since the patient was placed on Xarelto for persistent atrial flutter.  The patient's chronic shortness of breath is unquestionably also related to the underlying presence of severe COPD, morbid obesity, and possibe rheumatic lung disease.  I have personally reviewed the patient's most recent transthoracic echocardiogram.  He clearly has severe aortic stenosis with a tricuspid aortic valve that had severe thickening and restricted leaflet mobility involving all 3 leaflets.  The patient has numerous other comorbid medical problems including severely limited physical mobility because of his underlying arthritis and degenerative disc disease of the spine.  In addition, the patient has undergone coronary artery bypass grafting in the remote past, with history of vein graft disease requiring PCI and stent placement in 2010.  Under the circumstances, risks associated with conventional surgical aortic valve replacement would be exceptionally high.  I feel the patient would likely do very poorly with conventional surgery, and as a result I would favor transcatheter aortic valve replacement as an alternative if possible. However, the patient's symptomatic anemia  needs to be explained prior to any further cardiac workup, and ultimately the patient will need left and right heart catheterization to be performed.    Plan:  I discussed matters at length with the patient and his family in his hospital room this evening. We await results of the patient's planned endoscopy. We will continue to follow along throughout his subsequent hospitalization and workup.   I spent in excess of 120 minutes during the conduct of this hospital consultation and >50% of this time involved direct face-to-face encounter for counseling and/or coordination of the patient's care.   Salvatore Decent. Cornelius Moras, MD 01/31/2014 6:51 PM

## 2014-01-31 NOTE — Progress Notes (Addendum)
Patient Name: ILYAS [name] Date of Encounter: 01/31/2014     Principal Problem:   Aortic stenosis Active Problems:   Coronary atherosclerosis of artery bypass graft   HTN (hypertension)   Hyperlipidemia   Dyspnea   COPD GOLD III with reversibility    CHF (congestive heart failure)   S/P CABG x 5   Morbid obesity    SUBJECTIVE  Feeling much better. Able to lie flat last night but was uncomfortable so sat in arm chair. Still with significant LE swelling L>R, Left leg is chronically a little larger than right. Complains of pain in left calf. In general feeling much better.   CURRENT MEDS . arformoterol  15 mcg Nebulization Q12H  . budesonide  0.25 mg Nebulization BID  . diltiazem  30 mg Oral BID  . ezetimibe  10 mg Oral QHS  . insulin aspart  0-9 Units Subcutaneous TID WC  . insulin glargine  60 Units Subcutaneous QHS  . levofloxacin (LEVAQUIN) IV  750 mg Intravenous Q24H  . loratadine  10 mg Oral Daily  . losartan  25 mg Oral Daily  . metFORMIN  500 mg Oral BID WC  . metoprolol succinate  25 mg Oral BID  . potassium chloride SA  20 mEq Oral Daily  . predniSONE  5 mg Oral Daily  . sodium chloride  3 mL Intravenous Q12H    OBJECTIVE  Filed Vitals:   01/31/14 0246 01/31/14 0500 01/31/14 0720 01/31/14 0809  BP: 99/58  93/50   Pulse: 64  67   Temp: 97.5 F (36.4 C)  97.9 F (36.6 C)   TempSrc: Oral  Oral   Resp: 16  17   Height:      Weight:  302 lb 6.4 oz (137.168 kg)    SpO2: 97%  98% 95%    Intake/Output Summary (Last 24 hours) at 01/31/14 1043 Last data filed at 01/31/14 0900  Gross per 24 hour  Intake    300 ml  Output   3150 ml  Net  -2850 ml   Filed Weights   01/29/14 2350 01/30/14 0429 01/31/14 0500  Weight: 303 lb 9.2 oz (137.7 kg) 303 lb 9.2 oz (137.7 kg) 302 lb 6.4 oz (137.168 kg)    PHYSICAL EXAM  Bilateral diminished breath sounds at the bases but no obvious rales or wheezes  Irregularly irregular rhythm with 2-3/6 systolic murmur  right upper sternal border.  2+ bilateral lower extremity edema , L>R Alert and oriented with diminished hearing.    Accessory Clinical Findings  CBC No results found for this basename: WBC, NEUTROABS, HGB, HCT, MCV, PLT,  in the last 72 hours Basic Metabolic Panel  Recent Labs  01/30/14 0257 01/31/14 0345  NA 134* 135*  K 4.3 3.8  CL 99 97  CO2 26 28  GLUCOSE 178* 140*  BUN 13 14  CREATININE 0.85 0.93  CALCIUM 8.5 8.2*   Thyroid Function Tests  Recent Labs  01/30/14 0257  TSH 3.730    TELE  Atrial fibrillation with CVR  Radiology/Studies  Dg Chest Port 1v Same Day  01/30/2014   CLINICAL DATA:  CHF.  EXAM: PORTABLE CHEST - 1 VIEW SAME DAY  COMPARISON:  CT and chest radiograph, 01/28/2014  FINDINGS: Changes from CABG surgery are stable. Cardiac silhouette is normal in size and configuration. No convincing mediastinal or hilar masses. There is mild vascular congestion. Right basilar opacity likely combination of a small effusion and atelectasis. No overt pulmonary edema or  convincing infiltrate. No pneumothorax.  IMPRESSION: 1. Mild vascular congestion without overt pulmonary edema. No convincing pneumonia. Small effusion with right lung base atelectasis.   Electronically Signed   By: Amie Portland M.D.   On: 01/30/2014 12:53     2D ECHO: 01/30/2014: ef 50-55%. No RWMAs. Severe ZO:XWRUE area (VTI): 0.82 cm^2. Valve area (Vmax): 0.74 cm^2. Valve area (Vmean): 0.75 Cm^2. Mod LA/RA dilation. PA peak pressure 33 LV EF: 50% - 55% Study Conclusions - Left ventricle: The cavity size was normal. Wall thickness was normal. Systolic function was normal. The estimated ejection fraction was in the range of 50% to 55%. Wall motion was normal; there were no regional wall motion abnormalities. - Aortic valve: There was severe stenosis. Valve area (VTI): 0.82 cm^2. Valve area (Vmax): 0.74 cm^2. Valve area (Vmean): 0.75 cm^2. - Left atrium: The atrium was moderately dilated. -  Right atrium: The atrium was moderately dilated. - Pulmonary arteries: PA peak pressure: 33 mm Hg (S).     ASSESSMENT AND PLAN  68 yo male with history of severe aortic valve stenosis, RA, COPD, HTN, DM, CAD, GERD, atrial fibrillation/flutter, obesity and former tobacco abuse who is transferred  From [facility] to [facility] for further management of acute CHF and further evaluation of his aortic stenosis with consideration for TAVR.    Severe aortic valve stenosis: Now with symptomatic with CHF. He has no angina or syncope. His CHF is likely driven by his severe AS. He would be a high risk candidate for traditional surgical approach to aortic valve replacement. His STS risk score indicates 13.11% chance of mortality with traditional AVR. He has multiple comorbidities including advanced COPD, insulin dependent diabetes mellitus, morbid obesity, chronic low back pain with poor functional status, and chronic prednisone therapy. He will be considered a candidate for TAVR.  -- Pending [facility]CS to provide a surgical opinion (Dr. Laneta Simmers or Dr. Cornelius Moras).  -- Will need right and left heart cath later this week  -- Repeat ECHO: 01/30/2014: ef 50-55%. No RWMAs. Severe AV:WUJWJ area (VTI): 0.82 cm^2. Valve area (Vmax): 0.74 cm^2. Valve area (Vmean): 0.75 Cm^2. Mod LA/RA dilation. PA peak pressure 33 -- Repeat CXR with  Mild vascular congestion without overt pulmonary edema. No convincing pneumonia. Small effusion with right lung base  atelectasis. -- Anticipate cath before discharge and further planning for TAVR as an outpatient (CT scans) to avoid several large loads of contrast dye. We will follow along and will make further recommendations after his GI workup.   Acute diastolic CHF: Due to AS and anemia.  -- Continue Toprol XL 25mg  BID and Losartan -- Was on IV Lasix and then change to PO. Now on no diuretic therapy? Creat normal. Will need to resume; however, BP soft. Consider adding 40mg  po BID. He takes 40mg  qd at  home. MD to decide.   Persistent atrial fibrillation- rate controlled today.  -- Xarelto held given anemia, possible GI bleeding as well as in anticipation of cardiac cath later this week. -- Continue diltizaem 30mg  BID and Toprol XL 25mg  BID  Anemia- new anemia with hemoglobin of 9 noted at [facility], which was reduced from 13.3 on 12/11/13. During that admission he had anemia work up which suggested a low iron. He has not had any history of recent GI bleeding or suggestion of this. Stool guaiac was apparently pending at [facility]. He was transfused -- Xarelto on hold  -- No CBC today. Will order stat.   -- GI consult pending. I spoke  with MD from [facility] and he will see patient. May need a colonoscopy.   HTN: BPs have been soft continue to monitor. -- Continue Losartan, diltizaem 30mg  BID and Toprol XL 25mg  BID  DM - will discontinue metformin with possible LHC this admission. Continue SSI  Hyperlipidemia - cont Zetia  CAD: stable. He is s/p CABG with stenting of SVG. Will need right and left heart cath as above. Probably some time later this week.   COPD - continue home meds.    Possible PNA- repeat CXR with no convincing pneumonia -- Levaquin d/c'd  Left calf pain this AM.- chronically has more swelling in left leg; however, does not usually have pain. CTA negative for PE at [facility]. Consider LE dopplers. MD to see.    Billy Fischer PA-C  Pager [phone]

## 2014-02-01 ENCOUNTER — Encounter (HOSPITAL_COMMUNITY)
Admission: AD | Disposition: A | Discharge status: Home Health | Payer: Self-pay | Source: Other Acute Inpatient Hospital | Attending: Cardiology

## 2014-02-01 ENCOUNTER — Inpatient Hospital Stay (HOSPITAL_COMMUNITY): Payer: Medicare Other

## 2014-02-01 ENCOUNTER — Inpatient Hospital Stay (HOSPITAL_COMMUNITY): Payer: Medicare Other | Admitting: Anesthesiology

## 2014-02-01 ENCOUNTER — Encounter (HOSPITAL_COMMUNITY): Payer: Self-pay | Admitting: Thoracic Surgery (Cardiothoracic Vascular Surgery)

## 2014-02-01 ENCOUNTER — Encounter (HOSPITAL_COMMUNITY): Payer: Medicare Other | Admitting: Anesthesiology

## 2014-02-01 DIAGNOSIS — M5136 Other intervertebral disc degeneration, lumbar region: Secondary | ICD-10-CM | POA: Insufficient documentation

## 2014-02-01 DIAGNOSIS — I25810 Atherosclerosis of coronary artery bypass graft(s) without angina pectoris: Secondary | ICD-10-CM

## 2014-02-01 DIAGNOSIS — M51369 Other intervertebral disc degeneration, lumbar region without mention of lumbar back pain or lower extremity pain: Secondary | ICD-10-CM | POA: Insufficient documentation

## 2014-02-01 DIAGNOSIS — R609 Edema, unspecified: Secondary | ICD-10-CM

## 2014-02-01 DIAGNOSIS — K861 Other chronic pancreatitis: Secondary | ICD-10-CM | POA: Diagnosis present

## 2014-02-01 DIAGNOSIS — I5032 Chronic diastolic (congestive) heart failure: Secondary | ICD-10-CM | POA: Diagnosis present

## 2014-02-01 DIAGNOSIS — D509 Iron deficiency anemia, unspecified: Secondary | ICD-10-CM | POA: Diagnosis present

## 2014-02-01 DIAGNOSIS — I5033 Acute on chronic diastolic (congestive) heart failure: Secondary | ICD-10-CM | POA: Diagnosis present

## 2014-02-01 DIAGNOSIS — R040 Epistaxis: Secondary | ICD-10-CM | POA: Insufficient documentation

## 2014-02-01 DIAGNOSIS — I10 Essential (primary) hypertension: Secondary | ICD-10-CM

## 2014-02-01 DIAGNOSIS — K7460 Unspecified cirrhosis of liver: Secondary | ICD-10-CM | POA: Diagnosis present

## 2014-02-01 DIAGNOSIS — K766 Portal hypertension: Secondary | ICD-10-CM | POA: Diagnosis present

## 2014-02-01 HISTORY — DX: Portal hypertension: K76.6

## 2014-02-01 HISTORY — DX: Chronic diastolic (congestive) heart failure: I50.32

## 2014-02-01 HISTORY — DX: Unspecified cirrhosis of liver: K74.60

## 2014-02-01 HISTORY — PX: COLONOSCOPY WITH PROPOFOL: SHX5780

## 2014-02-01 HISTORY — PX: ESOPHAGOGASTRODUODENOSCOPY (EGD) WITH PROPOFOL: SHX5813

## 2014-02-01 HISTORY — DX: Other chronic pancreatitis: K86.1

## 2014-02-01 HISTORY — DX: Acute on chronic diastolic (congestive) heart failure: I50.33

## 2014-02-01 LAB — GLUCOSE, CAPILLARY
GLUCOSE-CAPILLARY: 64 mg/dL — AB (ref 70–99)
GLUCOSE-CAPILLARY: 111 mg/dL — AB (ref 70–99)
GLUCOSE-CAPILLARY: 72 mg/dL (ref 70–99)
GLUCOSE-CAPILLARY: 76 mg/dL (ref 70–99)
Glucose-Capillary: 53 mg/dL — ABNORMAL LOW (ref 70–99)
Glucose-Capillary: 88 mg/dL (ref 70–99)
Glucose-Capillary: 300 mg/dL — ABNORMAL HIGH (ref 70–99)

## 2014-02-01 LAB — HEPATIC FUNCTION PANEL
ALT: 47 U/L (ref 0–53)
AST: 68 U/L — ABNORMAL HIGH (ref 0–37)
Albumin: 2.8 g/dL — ABNORMAL LOW (ref 3.5–5.2)
Alkaline Phosphatase: 105 U/L (ref 39–117)
BILIRUBIN DIRECT: 0.3 mg/dL (ref 0.0–0.3)
BILIRUBIN TOTAL: 0.7 mg/dL (ref 0.3–1.2)
Indirect Bilirubin: 0.4 mg/dL (ref 0.3–0.9)
Total Protein: 7.2 g/dL (ref 6.0–8.3)

## 2014-02-01 LAB — CBC
HCT: 29.3 % — ABNORMAL LOW (ref 39.0–52.0)
HEMOGLOBIN: 9.2 g/dL — AB (ref 13.0–17.0)
MCH: 29.4 pg (ref 26.0–34.0)
MCHC: 31.4 g/dL (ref 30.0–36.0)
MCV: 93.6 fL (ref 78.0–100.0)
Platelets: 116 10*3/uL — ABNORMAL LOW (ref 150–400)
RBC: 3.13 MIL/uL — ABNORMAL LOW (ref 4.22–5.81)
RDW: 15.5 % (ref 11.5–15.5)
WBC: 5.2 10*3/uL (ref 4.0–10.5)

## 2014-02-01 LAB — BASIC METABOLIC PANEL
Anion gap: 10 (ref 5–15)
BUN: 11 mg/dL (ref 6–23)
CALCIUM: 8.2 mg/dL — AB (ref 8.4–10.5)
CO2: 28 mEq/L (ref 19–32)
CREATININE: 0.8 mg/dL (ref 0.50–1.35)
Chloride: 100 mEq/L (ref 96–112)
GFR calc Af Amer: >90 mL/min (ref >90)
GFR calc non Af Amer: 89 mL/min — ABNORMAL LOW (ref >90)
Glucose, Bld: 51 mg/dL — ABNORMAL LOW (ref 70–99)
Potassium: 3.9 mEq/L (ref 3.7–5.3)
Sodium: 138 mEq/L (ref 137–147)

## 2014-02-01 LAB — HEPARIN LEVEL (UNFRACTIONATED)
HEPARIN UNFRACTIONATED: 0.16 [IU]/mL — AB (ref 0.30–0.70)
Heparin Unfractionated: 0.34 IU/mL (ref 0.30–0.70)
Heparin Unfractionated: 0.67 IU/mL (ref 0.30–0.70)

## 2014-02-01 LAB — PROTIME-INR
INR: 1.19 (ref 0.00–1.49)
PROTHROMBIN TIME: 15.1 s (ref 11.6–15.2)

## 2014-02-01 LAB — APTT
aPTT: 43 seconds — ABNORMAL HIGH (ref 24–37)
aPTT: 52 seconds — ABNORMAL HIGH (ref 24–37)

## 2014-02-01 SURGERY — ESOPHAGOGASTRODUODENOSCOPY (EGD) WITH PROPOFOL
Anesthesia: Monitor Anesthesia Care

## 2014-02-01 MED ORDER — IOHEXOL 300 MG/ML  SOLN
100.0000 mL | Freq: Once | INTRAMUSCULAR | Status: AC | PRN
  Administered 2014-02-01: 100 mL via INTRAVENOUS

## 2014-02-01 MED ORDER — LACTATED RINGERS IV SOLN
INTRAVENOUS | Status: DC | PRN
  Administered 2014-02-01: 12:00:00 via INTRAVENOUS

## 2014-02-01 MED ORDER — PHENYLEPHRINE HCL 10 MG/ML IJ SOLN
INTRAMUSCULAR | Status: DC | PRN
  Administered 2014-02-01 (×4): 80 ug via INTRAVENOUS

## 2014-02-01 MED ORDER — DEXTROSE-NACL 5-0.45 % IV SOLN
INTRAVENOUS | Status: DC
  Administered 2014-02-01 – 2014-02-06 (×2): via INTRAVENOUS

## 2014-02-01 MED ORDER — HEPARIN (PORCINE) IN NACL 100-0.45 UNIT/ML-% IJ SOLN
1900.0000 [IU]/h | INTRAMUSCULAR | Status: DC
  Administered 2014-02-01: 1750 [IU]/h via INTRAVENOUS
  Administered 2014-02-02 – 2014-02-05 (×5): 1900 [IU]/h via INTRAVENOUS
  Filled 2014-02-01 (×10): qty 250

## 2014-02-01 MED ORDER — DEXTROSE 50 % IV SOLN
INTRAVENOUS | Status: AC
  Administered 2014-02-01: 50 mL
  Filled 2014-02-01: qty 50

## 2014-02-01 MED ORDER — FUROSEMIDE 10 MG/ML IJ SOLN
40.0000 mg | Freq: Two times a day (BID) | INTRAMUSCULAR | Status: DC
  Administered 2014-02-01 – 2014-02-02 (×3): 40 mg via INTRAVENOUS
  Filled 2014-02-01 (×5): qty 4

## 2014-02-01 MED ORDER — LACTATED RINGERS IV SOLN
INTRAVENOUS | Status: DC
  Administered 2014-02-01: 1000 mL via INTRAVENOUS

## 2014-02-01 MED ORDER — PROPOFOL INFUSION 10 MG/ML OPTIME
INTRAVENOUS | Status: DC | PRN
  Administered 2014-02-01: 50 ug/kg/min via INTRAVENOUS

## 2014-02-01 MED ORDER — FENTANYL CITRATE 0.05 MG/ML IJ SOLN
INTRAMUSCULAR | Status: DC | PRN
  Administered 2014-02-01: 50 ug via INTRAVENOUS

## 2014-02-01 MED ORDER — MIDAZOLAM HCL 5 MG/5ML IJ SOLN
INTRAMUSCULAR | Status: DC | PRN
  Administered 2014-02-01: 1 mg via INTRAVENOUS

## 2014-02-01 MED ORDER — DEXTROSE 50 % IV SOLN
INTRAVENOUS | Status: AC
  Administered 2014-02-01: 25 mL
  Filled 2014-02-01: qty 50

## 2014-02-01 NOTE — Progress Notes (Signed)
       Patient Name: [name] Date of Encounter: 02/01/2014    SUBJECTIVE: He feels well but wife still concerned about edema. Has focal right parasternal chest pain.  TELEMETRY:  A fib / flutter Filed Vitals:   02/01/14 0500 02/01/14 0700 02/01/14 0725 02/01/14 0921  BP:   113/58   Pulse:   83 87  Temp:  97.8 F (36.6 C)    TempSrc:  Oral    Resp:   21 12  Height:      Weight: 302 lb 7.5 oz (137.2 kg)     SpO2:   99% 100%    Intake/Output Summary (Last 24 hours) at 02/01/14 0957 Last data filed at 02/01/14 0900  Gross per 24 hour  Intake 972.25 ml  Output   1150 ml  Net -177.75 ml   LABS: Basic Metabolic Panel:  Recent Labs  55/73/22 0345 02/01/14 0351  NA 135* 138  K 3.8 3.9  CL 97 100  CO2 28 28  GLUCOSE 140* 51*  BUN 14 11  CREATININE 0.93 0.80  CALCIUM 8.2* 8.2*   CBC:  Recent Labs  01/31/14 1230 02/01/14 0351  WBC 8.4 5.2  HGB 10.1* 9.2*  HCT 32.2* 29.3*  MCV 93.1 93.6  PLT 147* 116*    Radiology/Studies:  No new data  Physical Exam: Blood pressure 113/58, pulse 87, temperature 97.8 F (36.6 C), temperature source Oral, resp. rate 12, height 5\' 11"  (1.803 m), weight 302 lb 7.5 oz (137.2 kg), SpO2 100.00%. Weight change: 1.1 oz (0.032 kg)  Wt Readings from Last 3 Encounters:  02/01/14 302 lb 7.5 oz (137.2 kg)  02/01/14 302 lb 7.5 oz (137.2 kg)  01/03/14 286 lb (129.729 kg)   He denies dyspnea. Bilateral LE edema Chest Clear. Lying comfortably. Extremities with bilateral LE edema. Neuro is stable  ASSESSMENT:  1. Severe AS 2. A/C diastolic HF, improved with diuresis 3. Anemia with drop in hemoglobin since IV heparin started yesterday 4. Bilateral LE edema, L>R 5. CAD 6. Falling platelet count, ? Heparin related.  Plan:  1. Continue diuresis 2. Watch hemoglobin and platelet count on heparin 3. ? Cath next week vs as OP depending on GI findings. 4. LE venous duplex not yet done.  03/05/14 02/01/2014, 9:57 AM

## 2014-02-01 NOTE — Progress Notes (Signed)
Patient ID: [name], male   DOB: [birth year], 68 y.o.   MRN: [id number]  EGD concerning for portal hypertension with gastropathy and esophageal varices seen so CT ordered post-procedure and CT consistent with cirrhosis and portal hypertension and also evidence of chronic pancreatitis seen. No focal liver lesions. Will check AFP. Findings of EGD/colon discussed with Dr. Katrinka Blazing following those procedures.

## 2014-02-01 NOTE — Interval H&P Note (Signed)
History and Physical Interval Note:  02/01/2014 12:03 PM  [name]  has presented today for surgery, with the diagnosis of Iron deficiency anemia due to chronic blood loss [D50.0]  The various methods of treatment have been discussed with the patient and family. After consideration of risks, benefits and other options for treatment, the patient has consented to  Procedure(s): ESOPHAGOGASTRODUODENOSCOPY (EGD) WITH PROPOFOL (N/A) COLONOSCOPY WITH PROPOFOL (N/A) as a surgical intervention .  The patient's history has been reviewed, patient examined, no change in status, stable for surgery.  I have reviewed the patient's chart and labs.  Questions were answered to the patient's satisfaction.     Aastha Dayley C.

## 2014-02-01 NOTE — Op Note (Signed)
[facility] [address] [place], [address]   COLONOSCOPY PROCEDURE REPORT     EXAM DATE: 02/01/2014  PATIENT NAME:      [name], [name]           MR #:      [id number]  BIRTHDATE:       1945-12-13      VISIT #:     [id number]  ATTENDING:     Charlott Rakes, MD     STATUS:     inpatient REFERRING MD:      hospital team ASA CLASS:        Class IV  INDICATIONS:  The patient is a 68 yr old male here for a colonoscopy due to iron deficiency anemia. PROCEDURE PERFORMED:     Colonoscopy, diagnostic MEDICATIONS:     Monitored anesthesia care and Per Anesthesia  ESTIMATED BLOOD LOSS:     None  CONSENT: The patient understands the risks and benefits of the procedure and understands that these risks include, but are not limited to: sedation, allergic reaction, infection, perforation and/or bleeding. Alternative means of evaluation and treatment include, among others: physical exam, x-rays, and/or surgical intervention. The patient elects to proceed with this endoscopic procedure.  DESCRIPTION OF PROCEDURE: During intra-op preparation period all mechanical & medical equipment was checked for proper function. Hand hygiene and appropriate measures for infection prevention was taken. After the risks, benefits and alternatives of the procedure were thoroughly explained, Informed consent was verified, confirmed and timeout was successfully executed by the treatment team. A digital exam revealed no abnormalities of the rectum.      The EC-[id number] ([id number]) endoscope was introduced through the anus and advanced to the cecum, which was identified by both the appendix and ileocecal valve. The prep was fair.. The instrument was then slowly withdrawn as the colon was fully examined. An adult colonoscope was inserted into a fair-prepped colon and advanced to the cecum where the appendiceal orifice and ileocecal valve were identified. In order to reach the  cecum loop reduction and abdominal pressure was necessary. In part of the transverse colon there was a moderate amount of stool and food products that could not be aspirated and prevented adequate visualization of this area. No mass lesions were seen during the colonoscopy. A few small sigmoid diverticuli were noted.      Retroflexed views revealed internal hemorrhoids.  The scope was then completely withdrawn from the patient and the procedure terminated.     ADVERSE EVENTS:      There were no immediate complications.   IMPRESSIONS:     Sigmoid diverticulosis Small internal hemorrhoids No anemia source seen on colonoscopy  RECOMMENDATIONS:     Supportive care; No repeat colonoscopy due to age and comorbidities; High fiber diet   Charlott Rakes, MD eSigned:  Charlott Rakes, MD 2014-02-01 76:28:31.517   cc:  CPT CODES: ICD CODES:  The ICD and CPT codes recommended by this software are interpretations from the data that the clinical staff has captured with the software.  The verification of the translation of this report to the ICD and CPT codes and modifiers is the sole responsibility of the health care institution and practicing physician where this report was generated.  [employer] will not be held responsible for the validity of the ICD and CPT codes included on this report.  AMA assumes no liability for data contained or not contained herein. CPT is a Publishing rights manager of the  [employer].

## 2014-02-01 NOTE — Progress Notes (Signed)
ANTICOAGULATION CONSULT NOTE - Follow Up Consult  Pharmacy Consult for Heparin  Indication: atrial fibrillation  No Known Allergies  Patient Measurements: Height: 5\' 11"  (180.3 cm) Weight: 302 lb 6.4 oz (137.168 kg) IBW/kg (Calculated) : 75.3 Heparin Dosing Weight: 107 kg  Vital Signs: Temp: 98.5 F (36.9 C) (10/01 2340) Temp Source: Oral (10/01 2340) BP: 111/59 mmHg (10/01 2340) Pulse Rate: 88 (10/01 2340)  Labs:  Recent Labs  01/30/14 0257 01/31/14 0345 01/31/14 1230 01/31/14 2348  HGB  --   --  10.1*  --   HCT  --   --  32.2*  --   PLT  --   --  147*  --   APTT  --   --   --  52*  HEPARINUNFRC  --   --   --  0.67  CREATININE 0.85 0.93  --   --     Estimated Creatinine Clearance: 106.1 ml/min (by C-G formula based on Cr of 0.93).  Assessment: 68 y/o M with afib/flutter PTA now on heparin while Xarelto is on hold for cath/GI/TAVR w/u. There is some Xarelto influence on the HL of 0.67 which doesn't completely correlate with the aPTT of 52, will use aPTT to dose until closer correlation between the two values.   Goal of Therapy:  Heparin level 0.3-0.7 units/ml aPTT 66-102 seconds Monitor platelets by anticoagulation protocol: Yes   Plan:  -Increase heparin drip to 1750 units/hr -0800 HL -Daily CBC/HL -Monitor for bleeding  78 02/01/2014,12:44 AM

## 2014-02-01 NOTE — H&P (View-Only) (Signed)
Referring Provider: Dr. Katrinka Blazing Primary Care Physician:  Donzetta Sprung, MD Primary Gastroenterologist:  Gentry Fitz  Reason for Consultation:  Anemia  HPI: [name] is a 68 y.o. male admitted for CHF and evaluation of his aortic stenosis who also has CAD, COPD, IDDM, morbid obesity, chronic back pain and was on Xarelto prior to admit. Consult requested due to anemia with Hgb 9 at outside hospital that is down from 13.3 in mid-August. Hemoccult from outside hospital result not known (pending per chart history). Denies melena, hematochezia, hematemesis, abdominal pain, nausea, vomiting, or heartburn. Denies any history of peptic ulcer disease. Reports a normal colonoscopy 7 years ago in [place]. Cardiology planning heart cath prior to discharge. A transcatheter aortic valve replacement (TAVR) is being considered depending on anemia workup findings. Currently sitting in a chair and reports that he feels a lot stronger since admit.    Past Medical History  Diagnosis Date  . Rheumatoid arthritis(714.0)     infliximab infusions every month   . COPD (chronic obstructive pulmonary disease)   . Elevated liver function tests   . Hypertension   . Diabetes mellitus   . Aortic stenosis     a. 2D ECHO: 01/30/2014: ef 50-55%. No RWMAs. Severe ZO:XWRUE area (VTI): 0.82 cm^2. Valve area (Vmax): 0.74 cm^2. Valve area (Vmean): 0.75 Cm^2. Mod LA/RA dilation. PA peak pressure 33  . CAD (coronary artery disease)   . GERD (gastroesophageal reflux disease)   . Atrial fibrillation   . 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 Degenerative disc disease, cervical   . Degenerative disc disease, lumbar 2010  . Morbid obesity   . Coronary atherosclerosis of artery bypass graft 2010    Past Surgical History  Procedure Laterality Date  . Coronary stent placement  01/02/2009    s/p bare metal stent to the vein graft to the  RCA 9/20. nml LV function   . Appendectomy    . Coronary artery bypass graft  03/27/1997    CABG x 5 by Dr Cornelius Moras  . Anterior cervical decompression      with posterior decompression and fusion   . Tonsillectomy and adenoidectomy      Prior to Admission medications   Medication Sig Start Date End Date Taking? Authorizing Provider  albuterol-ipratropium (COMBIVENT) 18-103 MCG/ACT inhaler Inhale 2 puffs into the lungs every 6 (six) hours as needed for wheezing or shortness of breath.    Yes Historical Provider, MD  ascorbic acid (VITAMIN C) 500 MG tablet Take 500 mg by mouth daily.    Yes Historical Provider, MD  budesonide (PULMICORT) 0.25 MG/2ML nebulizer solution Take 0.25 mg by nebulization 2 (two) times daily.   Yes Historical Provider, MD  cetirizine (ZYRTEC) 10 MG tablet Take 10 mg by mouth at bedtime.   Yes Historical Provider, MD  clopidogrel (PLAVIX) 75 MG tablet Take 75 mg by mouth daily.     Yes Historical Provider, MD  co-enzyme Q-10 30 MG capsule Take 30 mg by mouth at bedtime.    Yes Historical Provider, MD  desonide (DESOWEN) 0.05 % cream Apply 1 application topically 2 (two) times daily.   Yes Historical Provider, MD  diltiazem (CARDIZEM) 30 MG tablet Take 1 tablet (30 mg total) by mouth 2 (two) times daily. 10/09/13  Yes Laqueta Linden, MD  ezetimibe (ZETIA) 10 MG tablet Take 10 mg by  mouth at bedtime.    Yes Historical Provider, MD  formoterol (PERFOROMIST) 20 MCG/2ML nebulizer solution Take 2 mLs (20 mcg total) by nebulization 2 (two) times daily. Use in nebulizer twice daily perfectly regularly 03/21/13  Yes Nyoka Cowden, MD  furosemide (LASIX) 40 MG tablet Take 1 tablet (40 mg total) by mouth daily. 10/09/13  Yes Laqueta Linden, MD  guaifenesin (ROBITUSSIN) 100 MG/5ML syrup Take 200 mg by mouth 3 (three) times daily as needed for cough.   Yes Historical Provider, MD  insulin NPH-insulin regular (NOVOLIN 70/30) (70-30) 100 UNIT/ML injection Inject 80 Units into the  skin 2 (two) times daily.    Yes Historical Provider, MD  ipratropium-albuterol (DUONEB) 0.5-2.5 (3) MG/3ML SOLN Take 3 mLs by nebulization 4 (four) times daily as needed (for shortness of breath).    Yes Historical Provider, MD  ketoconazole (NIZORAL) 2 % cream Apply 1 application topically 2 (two) times daily as needed for irritation.    Yes Historical Provider, MD  loratadine (CLARITIN) 10 MG tablet Take 10 mg by mouth every morning.    Yes Historical Provider, MD  losartan (COZAAR) 100 MG tablet Take 50 mg by mouth at bedtime.    Yes Historical Provider, MD  metFORMIN (GLUCOPHAGE) 1000 MG tablet Take 1,000 mg by mouth 2 (two) times daily.     Yes Historical Provider, MD  metoprolol (TOPROL-XL) 50 MG 24 hr tablet Take 50 mg by mouth 2 (two) times daily.     Yes Historical Provider, MD  Multiple Vitamin (MULTIVITAMIN) tablet Take 1 tablet by mouth daily.     Yes Historical Provider, MD  oxyCODONE-acetaminophen (PERCOCET) 5-325 MG per tablet Take 1 tablet by mouth every 4 (four) hours as needed for moderate pain.    Yes Historical Provider, MD  potassium chloride SA (KLOR-CON M20) 20 MEQ tablet Take 1 tablet (20 mEq total) by mouth daily. 10/09/13  Yes Laqueta Linden, MD  predniSONE (DELTASONE) 5 MG tablet Take 5 mg by mouth daily.    Yes Historical Provider, MD  rivaroxaban (XARELTO) 20 MG TABS tablet Take 1 tablet (20 mg total) by mouth daily with supper. 12/05/13  Yes Tonny Bollman, MD    Scheduled Meds: . arformoterol  15 mcg Nebulization Q12H  . budesonide  0.25 mg Nebulization BID  . Chlorhexidine Gluconate Cloth  6 each Topical Q0600  . diltiazem  30 mg Oral BID  . ezetimibe  10 mg Oral QHS  . insulin aspart  0-9 Units Subcutaneous TID WC  . insulin glargine  60 Units Subcutaneous QHS  . levofloxacin (LEVAQUIN) IV  750 mg Intravenous Q24H  . loratadine  10 mg Oral Daily  . losartan  25 mg Oral Daily  . metoprolol succinate  25 mg Oral BID  . mupirocin ointment  1 application Nasal  BID  . polyethylene glycol-electrolytes  4,000 mL Oral Once  . potassium chloride SA  20 mEq Oral Daily  . predniSONE  5 mg Oral Daily  . sodium chloride  3 mL Intravenous Q12H   Continuous Infusions: . heparin 1,500 Units/hr (01/31/14 1433)   PRN Meds:.sodium chloride, acetaminophen, guaifenesin, ipratropium-albuterol, ketoconazole, ondansetron (ZOFRAN) IV, oxyCODONE-acetaminophen, sodium chloride  Allergies as of 01/29/2014  . (No Known Allergies)    Family History  Problem Relation Age of Onset  . Hypertension Mother   . Cancer Father   . Stroke Brother   . Liver cancer Paternal Grandfather   . Rheum arthritis Maternal Aunt   . Asthma Child   .  Emphysema Brother     smoked    History   Social History  . Marital Status: Married    Spouse Name: N/A    Number of Children: N/A  . Years of Education: N/A   Occupational History  . Retired Ins Agent    Social History Main Topics  . Smoking status: Former Smoker -- 3.00 packs/day for 35 years    Types: Cigarettes    Start date: 09/15/1961    Quit date: 11/23/1996  . Smokeless tobacco: Current User    Types: Chew     Comment: started chewing in 2001/chews 2-3 packs per week  . Alcohol Use: No  . Drug Use: No  . Sexual Activity: Not on file   Other Topics Concern  . Not on file   Social History Narrative   Married, retired.     Review of Systems: All negative from GI standpoint except as stated above in HPI.  Physical Exam: Vital signs: Filed Vitals:   01/31/14 1247  BP: 117/54  Pulse: 45  Temp: 97.9  Resp: 21     General:   Alert,  Obese, no acute distress HEENT: anicteric Lungs:  Clear throughout to auscultation anteriorly Heart:  Regular rate and rhythm Abdomen: soft, nontender, nondistended, +BS Rectal:  Deferred Ext: 3+ pitting LE edema  GI:  Lab Results:  Recent Labs  01/31/14 1230  WBC 8.4  HGB 10.1*  HCT 32.2*  PLT 147*   BMET  Recent Labs  01/30/14 0257 01/31/14 0345  NA  134* 135*  K 4.3 3.8  CL 99 97  CO2 26 28  GLUCOSE 178* 140*  BUN 13 14  CREATININE 0.85 0.93  CALCIUM 8.5 8.2*   LFT No results found for this basename: PROT, ALBUMIN, AST, ALT, ALKPHOS, BILITOT, BILIDIR, IBILI,  in the last 72 hours PT/INR No results found for this basename: LABPROT, INR,  in the last 72 hours   Studies/Results: Dg Chest Port 1v Same Day  01/30/2014   CLINICAL DATA:  CHF.  EXAM: PORTABLE CHEST - 1 VIEW SAME DAY  COMPARISON:  CT and chest radiograph, 01/28/2014  FINDINGS: Changes from CABG surgery are stable. Cardiac silhouette is normal in size and configuration. No convincing mediastinal or hilar masses. There is mild vascular congestion. Right basilar opacity likely combination of a small effusion and atelectasis. No overt pulmonary edema or convincing infiltrate. No pneumothorax.  IMPRESSION: 1. Mild vascular congestion without overt pulmonary edema. No convincing pneumonia. Small effusion with right lung base atelectasis.   Electronically Signed   By: David  Ormond M.D.   On: 01/30/2014 12:53    Impression/Plan: 68 yo with CHF, severe aortic stenosis (AS) and anemia on IV Heparin infusion and in need of further workup/treatment of his AS prior to discharge. Will do EGD/colonoscopy tomorrow with Diprivan sedation. Colon prep this evening. NPO p MN. Patient agreeable to proceed.    LOS: 2 days   Kullen Tomasetti C.  01/31/2014, 4:30 PM   

## 2014-02-01 NOTE — Progress Notes (Signed)
Pt blood sugar 53  1/2 amp d50 given Paged Chris. Berg NP he ordered  D50.45 NS at 20 cc/hr will continue to monitor.

## 2014-02-01 NOTE — Anesthesia Preprocedure Evaluation (Signed)
Anesthesia Evaluation    Airway       Dental   Pulmonary shortness of breath, COPDformer smoker,          Cardiovascular hypertension, + CAD, + Past MI, + Cardiac Stents, + CABG and +CHF     Neuro/Psych    GI/Hepatic GERD-  ,  Endo/Other  diabetes  Renal/GU      Musculoskeletal   Abdominal   Peds  Hematology  (+) anemia ,   Anesthesia Other Findings Transthoracic Echocardiography  Patient:    [name], [name] MR #:       [id number] Study Date: 01/30/2014 Gender:     M Age:        68 Height:     180.3 cm Weight:     137.4 kg BSA:        2.69 m^2 Pt. Status: Room:       3S15C   ADMITTING    Cassell Clement  ATTENDING    Rollene Rotunda  SONOGRAPHER  Nolon Rod, RDCS  ORDERING     Verne Carrow  REFERRING    McAlhany, Christopher  PERFORMING   Chmg, Inpatient  cc:  ------------------------------------------------------------------- LV EF: 50% -   55%  ------------------------------------------------------------------- Indications:      Aortic stenosis 424.1.  ------------------------------------------------------------------- History:   PMH:   Congestive heart failure.  Aortic valve disease. Chronic obstructive pulmonary disease.  Risk factors:  Former tobacco use. Hypertension.  ------------------------------------------------------------------- Study Conclusions  - Left ventricle: The cavity size was normal. Wall thickness was   normal. Systolic function was normal. The estimated ejection   fraction was in the range of 50% to 55%. Wall motion was normal;   there were no regional wall motion abnormalities. - Aortic valve: There was severe stenosis. Valve area (VTI): 0.82   cm^2. Valve area (Vmax): 0.74 cm^2. Valve area (Vmean): 0.75   cm^2. - Left atrium: The atrium was moderately dilated. - Right atrium: The atrium was moderately dilated. - Pulmonary arteries: PA peak pressure: 33 mm Hg  (S).  -------------------------------------------------------------------   Reproductive/Obstetrics                           Anesthesia Physical Anesthesia Plan  ASA: IV  Anesthesia Plan: MAC   Post-op Pain Management:    Induction:   Airway Management Planned: Simple Face Mask  Additional Equipment:   Intra-op Plan:   Post-operative Plan:   Informed Consent: I have reviewed the patients History and Physical, chart, labs and discussed the procedure including the risks, benefits and alternatives for the proposed anesthesia with the patient or authorized representative who has indicated his/her understanding and acceptance.   Dental advisory given  Plan Discussed with: CRNA, Anesthesiologist and Surgeon  Anesthesia Plan Comments:         Anesthesia Quick Evaluation

## 2014-02-01 NOTE — Progress Notes (Signed)
Inpatient Diabetes Program Recommendations  AACE/ADA: New Consensus Statement on Inpatient Glycemic Control (2013)  Target Ranges:  Prepandial:   less than 140 mg/dL      Peak postprandial:   less than 180 mg/dL (1-2 hours)      Critically ill patients:  140 - 180 mg/dL   Reason for Assessment:  Results for [name], [name] (MRN [id number]) as of 02/01/2014 11:00  Ref. Range 02/01/2014 05:15 02/01/2014 05:38 02/01/2014 07:25 02/01/2014 08:45  Glucose-Capillary Latest Range: 70-99 mg/dL 53 (L) 88 64 (L) 854 (H)   Diabetes history:  Type 2 diabetes Outpatient Diabetes medications:  Metformin 1000 mg bid, Novolin 70/30 80 units bid Current orders for Inpatient glycemic control:  Note patient is currently NPO.  CBG dropped to 51 mg/dL this AM.  Agree with the addition of D5 to IV fluids.  May also need slight decrease in Lantus to 55 units q HS.  Once eating, patient will likely need the addition of Novolog meal coverage 5 units tid with meals to cover CHO intake.   Thanks, Beryl Meager, RN, BC-ADM Inpatient Diabetes Coordinator Pager [phone]

## 2014-02-01 NOTE — Progress Notes (Addendum)
ANTICOAGULATION CONSULT NOTE - Follow Up Consult  Pharmacy Consult for Heparin Indication: atrial fibrillation  No Known Allergies  Patient Measurements: Height: 5\' 11"  (180.3 cm) Weight: 302 lb 7.5 oz (137.2 kg) IBW/kg (Calculated) : 75.3 Heparin Dosing Weight: 107 kg  Vital Signs: Temp: 97.8 F (36.6 C) (10/02 0700) Temp Source: Oral (10/02 0700) BP: 113/58 mmHg (10/02 0725) Pulse Rate: 87 (10/02 0921)  Labs:  Recent Labs  01/30/14 0257 01/31/14 0345 01/31/14 1230 01/31/14 2348 02/01/14 0351 02/01/14 0817  HGB  --   --  10.1*  --  9.2*  --   HCT  --   --  32.2*  --  29.3*  --   PLT  --   --  147*  --  116*  --   APTT  --   --   --  52*  --  43*  HEPARINUNFRC  --   --   --  0.67  --  0.16*  CREATININE 0.85 0.93  --   --  0.80  --     Estimated Creatinine Clearance: 123.4 ml/min (by C-G formula based on Cr of 0.8).   Assessment: Admit Complaint: Dyspnea, weight gain (20lbs in 3 wks), recent anemia.  Anticoagulation: CT negative for PE at [facility]. Afib/flutter on Xarelto PTA. Last dose midnight 9/30. Cardiology MD would like to start IV heparin in preparation for cath, GI and TAVR w/u. Hgb 10.1>>9.2. Plts down to 116. Heparin level 0.15, aPTT 43 both low. According to RN, heparin has been turned off for Endo. Ok per Dr. 10/30 to resume now after Endo.  Infectious Disease: Aspiration noted on CT. Levaquin 750mg  IV q24h (9/30- 10/3).  Cardiovascular: Severe AS, CAD, CABG '98, afib, HTN, Eval for TAVR. Needs cath. BP 113/58, HR 42-88, currently 67). EF 50-55%. B LE edema/HF. Meds: Diltiazem, Zetia, IV Lasix, losartan, toprol XL, K+  Endocrinology: DM on SSI, Lantus 80/hs, metformin (Glucose 53-247)  Gastrointestinal / Nutrition: w/u for anemia  Neurology: h/o back surgery  Nephrology: CrCl>100. K=3.9.  Pulmonary: COPD. Prednisone, Brovana, Budesonide, Claritin  Hematology / Oncology: Anemia s/p transfusion at [facility].   Goal of Therapy:  Heparin level  0.3-0.7 units/ml Monitor platelets by anticoagulation protocol: Yes   Plan:  Resume IV heparin at previous rate of 1750 units/hr Check heparin level in 6 hrs.   Chancey Ringel S. 11-25-1999, PharmD, BCPS Clinical Staff Pharmacist Pager [phone]  Merilynn Finland Stillinger 02/01/2014,10:45 AM

## 2014-02-01 NOTE — Progress Notes (Signed)
*  PRELIMINARY RESULTS* Vascular Ultrasound Lower extremity venous duplex has been completed.  Preliminary findings: no evidence of DVT  Farrel Demark, RDMS, RVT  02/01/2014, 5:46 PM

## 2014-02-01 NOTE — Op Note (Signed)
[name] [address] [place], [address]   ENDOSCOPY PROCEDURE REPORT  PATIENT: [name], [name]  MR#: [id number] BIRTHDATE: Jun 28, 1945 , 68  yrs. old GENDER: male ENDOSCOPIST: Charlott Rakes, MD REFERRED BY:  hospital team PROCEDURE DATE:  02/01/2014 PROCEDURE:  EGD, diagnostic ASA CLASS:     Class IV INDICATIONS:  iron deficiency anemia. MEDICATIONS: Monitored anesthesia care and Per Anesthesia TOPICAL ANESTHETIC:  DESCRIPTION OF PROCEDURE: After the risks benefits and alternatives of the procedure were thoroughly explained, informed consent was obtained.  The EC-3890Li ([id number]) endoscope was introduced through the mouth and advanced to the second portion of the duodenum , Without limitations.  The instrument was slowly withdrawn as the mucosa was fully examined.    Endoscope advanced into the esophagus, which revealed one column of small distal esophageal varices. GEJ 44 cm from the incisors. Mild portal gastropathy seen in the body and proximal stomach (diffuse mosaic pattern in this area of the stomach). Duodenal bulb mildly nodular without ulcers noted. Second portion of duodenum normal in appearance.       Retroflexion again revealed the mild portal hypertensive gastropathy.  No active bleeding seen.         The scope was then withdrawn from the patient and the procedure completed.  COMPLICATIONS: There were no immediate complications.  ENDOSCOPIC IMPRESSION:     Small distal esophageal varices and mild portal gastropathy concerning for portal hypertension  RECOMMENDATIONS:     Proceed with colonoscopy; Supportive care    eSigned:  Charlott Rakes, MD 2014-02-01 85:88:50.277    CC:  CPT CODES: ICD CODES:  The ICD and CPT codes recommended by this software are interpretations from the data that the clinical staff has captured with the software.  The verification of the translation of this report to the ICD and CPT  codes and modifiers is the sole responsibility of the health care institution and practicing physician where this report was generated.  [employer] will not be held responsible for the validity of the ICD and CPT codes included on this report.  AMA assumes no liability for data contained or not contained herein. CPT is a Publishing rights manager of the [employer].  PATIENT NAME:  [name], [name] MR#: [id number]

## 2014-02-01 NOTE — Progress Notes (Signed)
CARE MANAGEMENT NOTE 02/01/2014  Patient:  [name], [name]   Account Number:  [id number]  Date Initiated:  01/30/2014  Documentation initiated by:  Donn Pierini  Subjective/Objective Assessment:   Pt admitted with GIB and CHF, ? need for TAVR     Action/Plan:   PTA pt lived at home-- NCM to follow for possible d/c needs ?HH-HF need   Anticipated DC Date:     Anticipated DC Plan:        DC Planning Services  CM consult      Choice offered to / List presented to:             Status of service:  In process, will continue to follow Medicare Important Message given?  YES (If response is "NO", the following Medicare IM given date fields will be blank) Date Medicare IM given:  02/01/2014 Medicare IM given by:  [facility] Date Additional Medicare IM given:   Additional Medicare IM given by:    Discharge Disposition:    Per UR Regulation:  Reviewed for med. necessity/level of care/duration of stay  If discussed at Long Length of Stay Meetings, dates discussed:    Comments:

## 2014-02-01 NOTE — Transfer of Care (Signed)
Immediate Anesthesia Transfer of Care Note  Patient: [name]  Procedure(s) Performed: Procedure(s): ESOPHAGOGASTRODUODENOSCOPY (EGD) WITH PROPOFOL (N/A) COLONOSCOPY WITH PROPOFOL (N/A)  Patient Location: PACU  Anesthesia Type:General  Level of Consciousness: awake, alert  and oriented  Airway & Oxygen Therapy: Patient Spontanous Breathing and Patient connected to face mask oxygen  Post-op Assessment: Report given to PACU RN  Post vital signs: Reviewed and stable  Complications: No apparent anesthesia complications

## 2014-02-01 NOTE — Progress Notes (Addendum)
ANTICOAGULATION CONSULT NOTE - Follow Up Consult  Pharmacy Consult for Heparin Indication: atrial fibrillation  No Known Allergies  Patient Measurements: Height: 5\' 11"  (180.3 cm) Weight: 302 lb 7.5 oz (137.2 kg) IBW/kg (Calculated) : 75.3 Heparin Dosing Weight: 107 kg  Vital Signs: Temp: 97.9 F (36.6 C) (10/02 1937) Temp Source: Oral (10/02 1937) BP: 120/67 mmHg (10/02 1937) Pulse Rate: 138 (10/02 1937)  Labs:  Recent Labs  01/30/14 0257 01/31/14 0345 01/31/14 1230 01/31/14 2348 02/01/14 0351 02/01/14 0817 02/01/14 1529 02/01/14 2116  HGB  --   --  10.1*  --  9.2*  --   --   --   HCT  --   --  32.2*  --  29.3*  --   --   --   PLT  --   --  147*  --  116*  --   --   --   APTT  --   --   --  52*  --  43*  --   --   LABPROT  --   --   --   --   --   --  15.1  --   INR  --   --   --   --   --   --  1.19  --   HEPARINUNFRC  --   --   --  0.67  --  0.16*  --  0.34  CREATININE 0.85 0.93  --   --  0.80  --   --   --     Estimated Creatinine Clearance: 123.4 ml/min (by C-G formula based on Cr of 0.8).   Assessment: 6 hour heparin level = 0.34 on IV heparin drip 1750 units/hr- resumed after colonoscopy today.  GI reported colonoscopy impressions: Sigmoid diverticulosis, small internal hemorrhoids, no anemia source seen on colonoscopy. Therapeutic heparin level.    Patient was on Xarelto PTA for Afib/flutter. Cardiology MD wanted patient on IV heparin in preparation for cath.  ?Cath next week.   Hgb 10.1>>9.2. Plts down to 116.   Goal of Therapy:  Heparin level 0.3-0.7 units/ml Monitor platelets by anticoagulation protocol: Yes   Plan:  Continue IV heparin at previous rate of 1750 units/hr Daily Heparin level and CBC.    [phone], RPh Clinical Pharmacist Pager: [phone] 02/01/2014,10:20 PM

## 2014-02-02 LAB — HEPARIN LEVEL (UNFRACTIONATED)
Heparin Unfractionated: 0.22 IU/mL — ABNORMAL LOW (ref 0.30–0.70)
Heparin Unfractionated: 0.43 IU/mL (ref 0.30–0.70)
Heparin Unfractionated: 0.54 IU/mL (ref 0.30–0.70)

## 2014-02-02 LAB — BASIC METABOLIC PANEL
Anion gap: 9 (ref 5–15)
BUN: 11 mg/dL (ref 6–23)
CHLORIDE: 101 meq/L (ref 96–112)
CO2: 29 mEq/L (ref 19–32)
Calcium: 8.3 mg/dL — ABNORMAL LOW (ref 8.4–10.5)
Creatinine, Ser: 0.92 mg/dL (ref 0.50–1.35)
GFR, EST NON AFRICAN AMERICAN: 84 mL/min — AB (ref >90)
Glucose, Bld: 150 mg/dL — ABNORMAL HIGH (ref 70–99)
POTASSIUM: 4.1 meq/L (ref 3.7–5.3)
Sodium: 139 mEq/L (ref 137–147)

## 2014-02-02 LAB — CBC
HCT: 28.5 % — ABNORMAL LOW (ref 39.0–52.0)
Hemoglobin: 8.9 g/dL — ABNORMAL LOW (ref 13.0–17.0)
MCH: 28.9 pg (ref 26.0–34.0)
MCHC: 31.2 g/dL (ref 30.0–36.0)
MCV: 92.5 fL (ref 78.0–100.0)
Platelets: 116 10*3/uL — ABNORMAL LOW (ref 150–400)
RBC: 3.08 MIL/uL — ABNORMAL LOW (ref 4.22–5.81)
RDW: 15.4 % (ref 11.5–15.5)
WBC: 5.3 10*3/uL (ref 4.0–10.5)

## 2014-02-02 LAB — GLUCOSE, CAPILLARY
GLUCOSE-CAPILLARY: 219 mg/dL — AB (ref 70–99)
Glucose-Capillary: 169 mg/dL — ABNORMAL HIGH (ref 70–99)
Glucose-Capillary: 191 mg/dL — ABNORMAL HIGH (ref 70–99)
Glucose-Capillary: 261 mg/dL — ABNORMAL HIGH (ref 70–99)

## 2014-02-02 MED ORDER — FUROSEMIDE 10 MG/ML IJ SOLN
40.0000 mg | Freq: Three times a day (TID) | INTRAMUSCULAR | Status: DC
  Administered 2014-02-02 (×2): 40 mg via INTRAVENOUS
  Filled 2014-02-02 (×4): qty 4

## 2014-02-02 NOTE — Progress Notes (Signed)
Patient ID: [name], male   DOB: [birth year], 68 y.o.   MRN: [id number]     Subjective:    + SOB.   Objective:   Temp:  [97.9 F (36.6 C)-98.8 F (37.1 C)] 98.8 F (37.1 C) (10/03 0700) Pulse Rate:  [36-138] 83 (10/03 0852) Resp:  [15-24] 19 (10/03 0852) BP: (92-120)/(40-76) 108/67 mmHg (10/03 0730) SpO2:  [91 %-99 %] 98 % (10/03 0852) Weight:  [300 lb 14.9 oz (136.5 kg)] 300 lb 14.9 oz (136.5 kg) (10/03 0400) Last BM Date: 02/01/14  Filed Weights   01/31/14 0500 02/01/14 0500 02/02/14 0400  Weight: 302 lb 6.4 oz (137.168 kg) 302 lb 7.5 oz (137.2 kg) 300 lb 14.9 oz (136.5 kg)    Intake/Output Summary (Last 24 hours) at 02/02/14 1124 Last data filed at 02/02/14 0800  Gross per 24 hour  Intake   1320 ml  Output    600 ml  Net    720 ml    Telemetry: aflutter rate controlled  Exam:  General: NAD  Resp: crackles in bases  Cardiac: irreg, 3/6 systolic murmur RUSB, no JVD  GI: abdomen soft, NT, ND  MSK: 2+ bilaterla edema  Neuro: no focal deficits  Psych: appropriate affect  Lab Results:  Basic Metabolic Panel:  Recent Labs Lab 01/31/14 0345 02/01/14 0351 02/02/14 0342  NA 135* 138 139  K 3.8 3.9 4.1  CL 97 100 101  CO2 28 28 29   GLUCOSE 140* 51* 150*  BUN 14 11 11   CREATININE 0.93 0.80 0.92  CALCIUM 8.2* 8.2* 8.3*    Liver Function Tests:  Recent Labs Lab 02/01/14 1529  AST 68*  ALT 47  ALKPHOS 105  BILITOT 0.7  PROT 7.2  ALBUMIN 2.8*    CBC:  Recent Labs Lab 01/31/14 1230 02/01/14 0351 02/02/14 0342  WBC 8.4 5.2 5.3  HGB 10.1* 9.2* 8.9*  HCT 32.2* 29.3* 28.5*  MCV 93.1 93.6 92.5  PLT 147* 116* 116*    Cardiac Enzymes: No results found for this basename: CKTOTAL, CKMB, CKMBINDEX, TROPONINI,  in the last 168 hours  BNP: No results found for this basename: PROBNP,  in the last 8760 hours  Coagulation:  Recent Labs Lab 02/01/14 1529  INR 1.19    ECG:   Medications:   Scheduled Medications: .  arformoterol  15 mcg Nebulization Q12H  . budesonide  0.25 mg Nebulization BID  . Chlorhexidine Gluconate Cloth  6 each Topical Q0600  . diltiazem  30 mg Oral BID  . ezetimibe  10 mg Oral QHS  . furosemide  40 mg Intravenous Q12H  . insulin aspart  0-9 Units Subcutaneous TID WC  . insulin glargine  60 Units Subcutaneous QHS  . loratadine  10 mg Oral Daily  . losartan  25 mg Oral Daily  . metoprolol succinate  25 mg Oral BID  . mupirocin ointment  1 application Nasal BID  . potassium chloride SA  20 mEq Oral Daily  . predniSONE  5 mg Oral Daily  . sodium chloride  3 mL Intravenous Q12H     Infusions: . dextrose 5 % and 0.45% NaCl 20 mL/hr at 02/01/14 1500  . heparin 1,900 Units/hr (02/02/14 0800)     PRN Medications:  sodium chloride, acetaminophen, guaifenesin, ipratropium-albuterol, ketoconazole, ondansetron (ZOFRAN) IV, oxyCODONE-acetaminophen, sodium chloride     Assessment/Plan     1. Acute on chronic diastolic HF - echo 01/30/14 LVEF 50-55%, severe AS. Diastolic function not described, E/e' 15 consistent with  elevated LA pressures - + 740 mL yesterday, net negative 3 liters since admission. Kidney function is stable, he is on lasix 40mg  IV bid. Will increase to tid given remaining significant volume overload.   2. Severe aortic stenosis - AVA VTI 0.82, mean grad 52 - thought to be high risk by CT surgery for surgical AVR - he has seen Dr previously as an outpatient for TAVR consideration, at that time wanted to think things over before proceeding with further workup  - given his multiple medical comboridities, and new diagnosis of cirrhosis unlikely he is still a TAVR candidate. Will have Dr Excell Seltzer see patient Monday to give thoughts. If not candidate, will need consideration for palliative care.    3. Aflutter - anemia on xarelto. Admit Hgb 10.1, trended down to 9.2 and then 8.9. Stable. Off xarelto, now on hep gtt - has been on rate control with Toprol and  dilt, continue  4. Leg swelling - LE Tuesday prelim results negative for DVT - likely multifactorial including severe AS, diastolic dysfunction,and cirrhosis - increase lasix to 40mg  IV tid  5. Thrombocytopenia - has had low platelets in the past, down to 83 2 years ago. Admit plt 147, trended down to 116 and now stable. All cell lines trended down over that time period. Continue to follow at this time.   6. Anemia - xarelto on hold, on heparin gtt. Patient followed by GI with EGD and colonoscopy yesterday. EGD with evidenec of gastropathy and esoph varices, CT showed evidence of cirrhosis and evidence of chronic pancreatitis.  - small varices, not described as bleeding.  - colonscopy no significant findings  7. Cirrhosis - new diagnosis this admission, EGD and CT findings described above. Felt cirrhosis contributing to his anemia and thrombocytopenia. 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   [place], M.D.

## 2014-02-02 NOTE — Progress Notes (Signed)
Patient ID: [name], male   DOB: 03-13-1946, 68 y.o.   MRN: [id number] [facility] Gastroenterology Progress Note  [name] 68 y.o. 03-06-1946   Subjective: Sitting up on side of bed. No complaints. Denies abdominal pain. Wife at bedside.  Objective: Vital signs in last 24 hours: Filed Vitals:   02/02/14 0852  BP: 108/67  Pulse: 83  Temp: 98.8  Resp: 19    Physical Exam: Gen: alert, no acute distress Abd: epigastric tenderness with guarding, soft, nondistended, +BS  Lab Results:  Recent Labs  02/01/14 0351 02/02/14 0342  NA 138 139  K 3.9 4.1  CL 100 101  CO2 28 29  GLUCOSE 51* 150*  BUN 11 11  CREATININE 0.80 0.92  CALCIUM 8.2* 8.3*    Recent Labs  02/01/14 1529  AST 68*  ALT 47  ALKPHOS 105  BILITOT 0.7  PROT 7.2  ALBUMIN 2.8*    Recent Labs  02/01/14 0351 02/02/14 0342  WBC 5.2 5.3  HGB 9.2* 8.9*  HCT 29.3* 28.5*  MCV 93.6 92.5  PLT 116* 116*    Recent Labs  02/01/14 1529  LABPROT 15.1  INR 1.19      Assessment/Plan: 68 yo with evidence of cirrhosis on CT yesterday and EGD. 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 Reports being tested for hepatitis prior to treatment for his RA (results not known). Cirrhosis contributing to his anemia and thrombocytopenia. New finding of cirrhosis complicates planned procedures for his heart and cardiology to f/u. From a liver standpoint I do not think he is a liver transplant candidate with his multiple comorbidities. Low sodium diet. Supportive care. Defer decision on anticoagulation to cardiology. No other recs. Will sign off. Call if questions.  [name] C. 02/02/2014, 10:46 AM

## 2014-02-02 NOTE — Progress Notes (Signed)
ANTICOAGULATION CONSULT NOTE - Follow Up Consult  Pharmacy Consult for Heparin  Indication: atrial fibrillation  No Known Allergies  Patient Measurements: Height: 5\' 11"  (180.3 cm) Weight: 302 lb 7.5 oz (137.2 kg) IBW/kg (Calculated) : 75.3 Heparin Dosing Weight: 107 kg  Vital Signs: Temp: 98.5 F (36.9 C) (10/03 0316) Temp Source: Oral (10/03 0316) BP: 102/64 mmHg (10/03 0316) Pulse Rate: 86 (10/03 0316)  Labs:  Recent Labs  01/31/14 0345  01/31/14 1230  01/31/14 2348 02/01/14 0351 02/01/14 0817 02/01/14 1529 02/01/14 2116 02/02/14 0342  HGB  --   < > 10.1*  --   --  9.2*  --   --   --  8.9*  HCT  --   --  32.2*  --   --  29.3*  --   --   --  28.5*  PLT  --   --  147*  --   --  116*  --   --   --  116*  APTT  --   --   --   --  52*  --  43*  --   --   --   LABPROT  --   --   --   --   --   --   --  15.1  --   --   INR  --   --   --   --   --   --   --  1.19  --   --   HEPARINUNFRC  --   --   --   < > 0.67  --  0.16*  --  0.34 0.22*  CREATININE 0.93  --   --   --   --  0.80  --   --   --  0.92  < > = values in this interval not displayed.  Estimated Creatinine Clearance: 107.3 ml/min (by C-G formula based on Cr of 0.92).  Assessment: 68 y/o M with afib/flutter PTA now on heparin while Xarelto is on hold for cath/GI/TAVR w/u. Now using HL to guide dosing. HL is low at 0.22. No issues per RN.   Goal of Therapy:  Heparin level 0.3-0.7 units/ml aPTT 66-102 seconds Monitor platelets by anticoagulation protocol: Yes   Plan:  -Increase heparin drip to 1900 units/hr -1200 HL -Daily CBC/HL -Monitor for bleeding  68 02/02/2014,4:57 AM

## 2014-02-02 NOTE — Progress Notes (Signed)
ANTICOAGULATION CONSULT NOTE - Follow Up Consult  Pharmacy Consult for heparin Indication: atrial fibrillation  No Known Allergies  Patient Measurements: Height: 5\' 11"  (180.3 cm) Weight: 300 lb 14.9 oz (136.5 kg) IBW/kg (Calculated) : 75.3 Heparin Dosing Weight: 107 kg  Vital Signs: Temp: 98.3 F (36.8 C) (10/03 1920) Temp Source: Oral (10/03 1920) BP: 103/65 mmHg (10/03 1920) Pulse Rate: 89 (10/03 1920)  Labs:  Recent Labs  01/31/14 0345  01/31/14 1230  01/31/14 2348 02/01/14 0351 02/01/14 0817 02/01/14 1529  02/02/14 0342 02/02/14 1225 02/02/14 1901  HGB  --   < > 10.1*  --   --  9.2*  --   --   --  8.9*  --   --   HCT  --   --  32.2*  --   --  29.3*  --   --   --  28.5*  --   --   PLT  --   --  147*  --   --  116*  --   --   --  116*  --   --   APTT  --   --   --   --  52*  --  43*  --   --   --   --   --   LABPROT  --   --   --   --   --   --   --  15.1  --   --   --   --   INR  --   --   --   --   --   --   --  1.19  --   --   --   --   HEPARINUNFRC  --   --   --   < > 0.67  --  0.16*  --   < > 0.22* 0.43 0.54  CREATININE 0.93  --   --   --   --  0.80  --   --   --  0.92  --   --   < > = values in this interval not displayed.  Estimated Creatinine Clearance: 107 ml/min (by C-G formula based on Cr of 0.92).   Medications:  Scheduled:  . arformoterol  15 mcg Nebulization Q12H  . budesonide  0.25 mg Nebulization BID  . Chlorhexidine Gluconate Cloth  6 each Topical Q0600  . diltiazem  30 mg Oral BID  . ezetimibe  10 mg Oral QHS  . furosemide  40 mg Intravenous TID  . insulin aspart  0-9 Units Subcutaneous TID WC  . insulin glargine  60 Units Subcutaneous QHS  . loratadine  10 mg Oral Daily  . losartan  25 mg Oral Daily  . metoprolol succinate  25 mg Oral BID  . mupirocin ointment  1 application Nasal BID  . potassium chloride SA  20 mEq Oral Daily  . predniSONE  5 mg Oral Daily  . sodium chloride  3 mL Intravenous Q12H   Infusions:  . dextrose 5 %  and 0.45% NaCl 20 mL/hr at 02/01/14 1500  . heparin 1,900 Units/hr (02/02/14 1800)    Assessment: 68 yo m with afib/aflutter PTA.  Patient is on heparin while Xarelto on hold for cath/GI/TAVR work up.    PM heparin level therapeutic  Goal of Therapy:  Heparin level 0.3-0.7 units/ml Monitor platelets by anticoagulation protocol: Yes   Plan:  Continue heparin infusion 1900 units/hr Monitor hgb/plts, s/s of bleeding, clinical  course Follow up am labs  Thank you. Okey Regal, PharmD [phone] 02/02/2014 7:47 PM

## 2014-02-02 NOTE — Progress Notes (Signed)
ANTICOAGULATION CONSULT NOTE - Follow Up Consult  Pharmacy Consult for heparin Indication: atrial fibrillation  No Known Allergies  Patient Measurements: Height: 5\' 11"  (180.3 cm) Weight: 300 lb 14.9 oz (136.5 kg) IBW/kg (Calculated) : 75.3 Heparin Dosing Weight: 107 kg  Vital Signs: Temp: 98.3 F (36.8 C) (10/03 1140) Temp Source: Oral (10/03 1140) BP: 115/55 mmHg (10/03 1140) Pulse Rate: 74 (10/03 1140)  Labs:  Recent Labs  01/31/14 0345  01/31/14 1230  01/31/14 2348 02/01/14 0351 02/01/14 0817 02/01/14 1529 02/01/14 2116 02/02/14 0342 02/02/14 1225  HGB  --   < > 10.1*  --   --  9.2*  --   --   --  8.9*  --   HCT  --   --  32.2*  --   --  29.3*  --   --   --  28.5*  --   PLT  --   --  147*  --   --  116*  --   --   --  116*  --   APTT  --   --   --   --  52*  --  43*  --   --   --   --   LABPROT  --   --   --   --   --   --   --  15.1  --   --   --   INR  --   --   --   --   --   --   --  1.19  --   --   --   HEPARINUNFRC  --   --   --   < > 0.67  --  0.16*  --  0.34 0.22* 0.43  CREATININE 0.93  --   --   --   --  0.80  --   --   --  0.92  --   < > = values in this interval not displayed.  Estimated Creatinine Clearance: 107 ml/min (by C-G formula based on Cr of 0.92).   Medications:  Scheduled:  . arformoterol  15 mcg Nebulization Q12H  . budesonide  0.25 mg Nebulization BID  . Chlorhexidine Gluconate Cloth  6 each Topical Q0600  . diltiazem  30 mg Oral BID  . ezetimibe  10 mg Oral QHS  . furosemide  40 mg Intravenous TID  . insulin aspart  0-9 Units Subcutaneous TID WC  . insulin glargine  60 Units Subcutaneous QHS  . loratadine  10 mg Oral Daily  . losartan  25 mg Oral Daily  . metoprolol succinate  25 mg Oral BID  . mupirocin ointment  1 application Nasal BID  . potassium chloride SA  20 mEq Oral Daily  . predniSONE  5 mg Oral Daily  . sodium chloride  3 mL Intravenous Q12H   Infusions:  . dextrose 5 % and 0.45% NaCl 20 mL/hr at 02/01/14 1500   . heparin 1,900 Units/hr (02/02/14 1200)    Assessment: 68 yo m with afib/aflutter PTA.  Patient is on heparin while Xarelto on hold for cath/GI/TAVR work up.  HL this afternoon was therapeutic at 0.43 on 1900 units/hr.  Will continue infusion at same rate and get a confirmatory HL at 1900. Hgb 8.9, plts 116, no s/s of bleeding.  Goal of Therapy:  Heparin level 0.3-0.7 units/ml Monitor platelets by anticoagulation protocol: Yes   Plan:  Continue heparin infusion 1900 units/hr 6-hr HL @ 1900 Monitor  hgb/plts, s/s of bleeding, clinical course  Remmie Bembenek L. Roseanne Ren[place], PharmD Clinical Pharmacy Resident Pager: [phone] 02/02/2014 1:22 PM

## 2014-02-03 LAB — HEPARIN LEVEL (UNFRACTIONATED): HEPARIN UNFRACTIONATED: 0.45 [IU]/mL (ref 0.30–0.70)

## 2014-02-03 LAB — GLUCOSE, CAPILLARY
GLUCOSE-CAPILLARY: 109 mg/dL — AB (ref 70–99)
Glucose-Capillary: 206 mg/dL — ABNORMAL HIGH (ref 70–99)
Glucose-Capillary: 212 mg/dL — ABNORMAL HIGH (ref 70–99)
Glucose-Capillary: 156 mg/dL — ABNORMAL HIGH (ref 70–99)

## 2014-02-03 LAB — CBC
HCT: 31 % — ABNORMAL LOW (ref 39.0–52.0)
Hemoglobin: 9.6 g/dL — ABNORMAL LOW (ref 13.0–17.0)
MCH: 28.8 pg (ref 26.0–34.0)
MCHC: 31 g/dL (ref 30.0–36.0)
MCV: 93.1 fL (ref 78.0–100.0)
PLATELETS: 116 10*3/uL — AB (ref 150–400)
RBC: 3.33 MIL/uL — ABNORMAL LOW (ref 4.22–5.81)
RDW: 15.2 % (ref 11.5–15.5)
WBC: 5.7 10*3/uL (ref 4.0–10.5)

## 2014-02-03 LAB — BASIC METABOLIC PANEL
ANION GAP: 10 (ref 5–15)
BUN: 10 mg/dL (ref 6–23)
CALCIUM: 8.6 mg/dL (ref 8.4–10.5)
CO2: 29 meq/L (ref 19–32)
CREATININE: 0.98 mg/dL (ref 0.50–1.35)
Chloride: 95 mEq/L — ABNORMAL LOW (ref 96–112)
GFR, EST NON AFRICAN AMERICAN: 82 mL/min — AB (ref >90)
Glucose, Bld: 174 mg/dL — ABNORMAL HIGH (ref 70–99)
Potassium: 4.1 mEq/L (ref 3.7–5.3)
SODIUM: 134 meq/L — AB (ref 137–147)

## 2014-02-03 MED ORDER — FUROSEMIDE 10 MG/ML IJ SOLN
60.0000 mg | Freq: Three times a day (TID) | INTRAMUSCULAR | Status: DC
Start: 2014-02-03 — End: 2014-02-04
  Administered 2014-02-03 – 2014-02-04 (×3): 60 mg via INTRAVENOUS
  Filled 2014-02-03 (×4): qty 6

## 2014-02-03 NOTE — Progress Notes (Signed)
Gave pt the Living with Heart Failure booklet. Pt did not want to watch the Heart Failure video at this time.

## 2014-02-03 NOTE — Progress Notes (Signed)
Patient ID: [name], male   DOB: May 22, 1945, 68 y.o.   MRN: [id number]     Subjective:    SOB mildly improved  Objective:   Temp:  [98.3 F (36.8 C)-99.2 F (37.3 C)] 98.5 F (36.9 C) (10/04 0700) Pulse Rate:  [67-89] 78 (10/04 0735) Resp:  [16-27] 23 (10/04 0735) BP: (103-115)/(46-71) 107/56 mmHg (10/04 0735) SpO2:  [96 %-99 %] 98 % (10/04 0735) Weight:  [298 lb 15.1 oz (135.6 kg)] 298 lb 15.1 oz (135.6 kg) (10/04 0341) Last BM Date: 02/01/14  Filed Weights   02/01/14 0500 02/02/14 0400 02/03/14 0341  Weight: 302 lb 7.5 oz (137.2 kg) 300 lb 14.9 oz (136.5 kg) 298 lb 15.1 oz (135.6 kg)    Intake/Output Summary (Last 24 hours) at 02/03/14 0853 Last data filed at 02/03/14 0700  Gross per 24 hour  Intake    460 ml  Output    850 ml  Net   -390 ml    Telemetry: Aflutter 60-90s  Exam:  General: NAD  Resp: faint crackles bilateral bases  Cardiac: irreg, 3/6 systolic mid to late peaking murmur RUSB, no JVD  GI: abdomen soft, NT, ND  MSK: 2+ bilateral LE edema  Neuro: no focal deficits  Psych: appropriate affect  Lab Results:  Basic Metabolic Panel:  Recent Labs Lab 02/01/14 0351 02/02/14 0342 02/03/14 0225  NA 138 139 134*  K 3.9 4.1 4.1  CL 100 101 95*  CO2 28 29 29   GLUCOSE 51* 150* 174*  BUN 11 11 10   CREATININE 0.80 0.92 0.98  CALCIUM 8.2* 8.3* 8.6    Liver Function Tests:  Recent Labs Lab 02/01/14 1529  AST 68*  ALT 47  ALKPHOS 105  BILITOT 0.7  PROT 7.2  ALBUMIN 2.8*    CBC:  Recent Labs Lab 02/01/14 0351 02/02/14 0342 02/03/14 0225  WBC 5.2 5.3 5.7  HGB 9.2* 8.9* 9.6*  HCT 29.3* 28.5* 31.0*  MCV 93.6 92.5 93.1  PLT 116* 116* 116*    Cardiac Enzymes: No results found for this basename: CKTOTAL, CKMB, CKMBINDEX, TROPONINI,  in the last 168 hours  BNP: No results found for this basename: PROBNP,  in the last 8760 hours  Coagulation:  Recent Labs Lab 02/01/14 1529  INR 1.19    ECG:   Medications:     Scheduled Medications: . arformoterol  15 mcg Nebulization Q12H  . budesonide  0.25 mg Nebulization BID  . Chlorhexidine Gluconate Cloth  6 each Topical Q0600  . diltiazem  30 mg Oral BID  . ezetimibe  10 mg Oral QHS  . furosemide  40 mg Intravenous TID  . insulin aspart  0-9 Units Subcutaneous TID WC  . insulin glargine  60 Units Subcutaneous QHS  . loratadine  10 mg Oral Daily  . losartan  25 mg Oral Daily  . metoprolol succinate  25 mg Oral BID  . mupirocin ointment  1 application Nasal BID  . potassium chloride SA  20 mEq Oral Daily  . predniSONE  5 mg Oral Daily  . sodium chloride  3 mL Intravenous Q12H     Infusions: . dextrose 5 % and 0.45% NaCl 20 mL/hr at 02/01/14 1500  . heparin 1,900 Units/hr (02/03/14 0700)     PRN Medications:  sodium chloride, acetaminophen, guaifenesin, ipratropium-albuterol, ketoconazole, ondansetron (ZOFRAN) IV, oxyCODONE-acetaminophen, sodium chloride     Assessment/Plan    1. Acute on chronic diastolic HF  - echo 01/30/14 LVEF 50-55%, severe AS. Diastolic function  not described, E/e' 15 consistent with elevated LA pressures  - neg  yesterday, net negative 3.3 liters since admission. Kidney function is stable, he is on lasix 40mg  IV tid. Gentle diuresis in setting of severe AS, however still volume overloaded and not much negative yesterday. Increase lasix to 60mg  tid.   2. Severe aortic stenosis  - AVA VTI 0.82, mean grad 52  - thought to be high risk by CT surgery for surgical AVR  - he has seen Dr previously as an outpatient for TAVR consideration, at that time patient wanted to think things over before proceeding with further workup  - given his multiple medical comboridities, and new diagnosis of cirrhosis unlikely he is still a TAVR candidate. Will need to have Dr see patient Monday to give thoughts. If not candidate, will need consideration for palliative care likely in the near future.    3. Aflutter  -  anemia on xarelto. Admit Hgb 10.1, trended down to 9.2 and then 8.9. Stable and now uptrending. Off xarelto, now on hep gtt  - has been on rate control with Toprol and dilt, continue  - continue hep gtt for now, if no further cardiac testing planned will need consideration for restarting long term anticoag.  4. Leg swelling  - LE Excell Seltzer prelim results negative for DVT  - likely multifactorial including severe AS, diastolic dysfunction,and cirrhosis  - increase lasix to 60mg  IV tid   5. Thrombocytopenia  - has had low platelets in the past, down to 83 2 years ago. Admit plt 147, trended down to 116 and now stable. All cell lines trended down over that time period. Continue to follow at this time. Likely related to cirrhosis, clinically not consistent with HIT  6. Anemia  - xarelto on hold, on heparin gtt. Patient followed by GI with EGD and colonoscopy. 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  - small varices, not described as bleeding.  - colonscopy no significant findings   7. Cirrhosis  - new diagnosis this admission, EGD and CT findings described above. Felt cirrhosis contributing to his anemia and thrombocytopenia. 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         [place], M.D.

## 2014-02-03 NOTE — Progress Notes (Signed)
Paged cardiology at 1750 regarding pts lasix dose due and BP 91/78. Awaiting call back.

## 2014-02-03 NOTE — Progress Notes (Signed)
ANTICOAGULATION CONSULT NOTE - Follow Up Consult  Pharmacy Consult for heparin Indication: atrial fibrillation  No Known Allergies  Patient Measurements: Height: 5\' 11"  (180.3 cm) Weight: 298 lb 15.1 oz (135.6 kg) IBW/kg (Calculated) : 75.3 Heparin Dosing Weight: 107 kg  Vital Signs: Temp: 98.5 F (36.9 C) (10/04 0700) Temp Source: Oral (10/04 0700) BP: 107/56 mmHg (10/04 0735) Pulse Rate: 78 (10/04 0735)  Labs:  Recent Labs  01/31/14 2348 02/01/14 0351 02/01/14 0817 02/01/14 1529  02/02/14 0342 02/02/14 1225 02/02/14 1901 02/03/14 0225  HGB  --  9.2*  --   --   --  8.9*  --   --  9.6*  HCT  --  29.3*  --   --   --  28.5*  --   --  31.0*  PLT  --  116*  --   --   --  116*  --   --  116*  APTT 52*  --  43*  --   --   --   --   --   --   LABPROT  --   --   --  15.1  --   --   --   --   --   INR  --   --   --  1.19  --   --   --   --   --   HEPARINUNFRC 0.67  --  0.16*  --   < > 0.22* 0.43 0.54 0.45  CREATININE  --  0.80  --   --   --  0.92  --   --  0.98  < > = values in this interval not displayed.  Estimated Creatinine Clearance: 100 ml/min (by C-G formula based on Cr of 0.98).   Medications:  Scheduled:  . arformoterol  15 mcg Nebulization Q12H  . budesonide  0.25 mg Nebulization BID  . Chlorhexidine Gluconate Cloth  6 each Topical Q0600  . diltiazem  30 mg Oral BID  . ezetimibe  10 mg Oral QHS  . furosemide  40 mg Intravenous TID  . insulin aspart  0-9 Units Subcutaneous TID WC  . insulin glargine  60 Units Subcutaneous QHS  . loratadine  10 mg Oral Daily  . losartan  25 mg Oral Daily  . metoprolol succinate  25 mg Oral BID  . mupirocin ointment  1 application Nasal BID  . potassium chloride SA  20 mEq Oral Daily  . predniSONE  5 mg Oral Daily  . sodium chloride  3 mL Intravenous Q12H   Infusions:  . dextrose 5 % and 0.45% NaCl 20 mL/hr at 02/01/14 1500  . heparin 1,900 Units/hr (02/03/14 0700)    Assessment: 68 yo m with afib/aflutter PTA.   Patient on heparin while Xarelto on hold for cath/GI/TAVR work up planned for tomorrow.  HL this AM was therapeutic at 0.45 on 1900 units/hr.  Will continue current infusion and check HL with AM labs. Hgb 9.6 (up from 8.9), plts 116, no s/s of bleeding  Goal of Therapy:  Heparin level 0.3-0.7 units/ml Monitor platelets by anticoagulation protocol: Yes   Plan:  Continue heparin infusion at 1900 units/hr Daily HL in AM Monitor hgb/plts, s/s of bleeding, plans for cath tomorrow, clinical course  Ovidio Steele L. 77, PharmD Clinical Pharmacy Resident Pager: [phone] 02/03/2014 8:45 AM

## 2014-02-04 ENCOUNTER — Encounter (HOSPITAL_COMMUNITY): Payer: Self-pay | Admitting: Thoracic Surgery (Cardiothoracic Vascular Surgery)

## 2014-02-04 LAB — BASIC METABOLIC PANEL
Anion gap: 7 (ref 5–15)
BUN: 9 mg/dL (ref 6–23)
CALCIUM: 8.9 mg/dL (ref 8.4–10.5)
CO2: 33 mEq/L — ABNORMAL HIGH (ref 19–32)
CREATININE: 0.9 mg/dL (ref 0.50–1.35)
Chloride: 97 mEq/L (ref 96–112)
GFR calc Af Amer: >90 mL/min (ref >90)
GFR calc non Af Amer: 85 mL/min — ABNORMAL LOW (ref >90)
GLUCOSE: 108 mg/dL — AB (ref 70–99)
Potassium: 3.7 mEq/L (ref 3.7–5.3)
Sodium: 137 mEq/L (ref 137–147)

## 2014-02-04 LAB — CBC
HCT: 31.1 % — ABNORMAL LOW (ref 39.0–52.0)
HEMOGLOBIN: 9.7 g/dL — AB (ref 13.0–17.0)
MCH: 28.4 pg (ref 26.0–34.0)
MCHC: 31.2 g/dL (ref 30.0–36.0)
MCV: 91.2 fL (ref 78.0–100.0)
Platelets: 115 10*3/uL — ABNORMAL LOW (ref 150–400)
RBC: 3.41 MIL/uL — ABNORMAL LOW (ref 4.22–5.81)
RDW: 15.2 % (ref 11.5–15.5)
WBC: 5 10*3/uL (ref 4.0–10.5)

## 2014-02-04 LAB — GLUCOSE, CAPILLARY
GLUCOSE-CAPILLARY: 81 mg/dL (ref 70–99)
GLUCOSE-CAPILLARY: 179 mg/dL — AB (ref 70–99)
Glucose-Capillary: 249 mg/dL — ABNORMAL HIGH (ref 70–99)
Glucose-Capillary: 189 mg/dL — ABNORMAL HIGH (ref 70–99)

## 2014-02-04 LAB — HEPARIN LEVEL (UNFRACTIONATED): Heparin Unfractionated: 0.33 IU/mL (ref 0.30–0.70)

## 2014-02-04 MED ORDER — FUROSEMIDE 10 MG/ML IJ SOLN
60.0000 mg | Freq: Three times a day (TID) | INTRAMUSCULAR | Status: AC
  Administered 2014-02-04 – 2014-02-05 (×2): 60 mg via INTRAVENOUS

## 2014-02-04 NOTE — Progress Notes (Addendum)
ANTICOAGULATION CONSULT NOTE - Follow Up Consult  Pharmacy Consult for heparin Indication: atrial fibrillation  No Known Allergies  Patient Measurements: Height: 5\' 11"  (180.3 cm) Weight: 298 lb 15.1 oz (135.6 kg) IBW/kg (Calculated) : 75.3 Heparin Dosing Weight: 107 kg  Vital Signs: Temp: 98 F (36.7 C) (10/05 0700) Temp Source: Oral (10/05 0700) BP: 109/58 mmHg (10/05 0719) Pulse Rate: 71 (10/05 0719)  Labs:  Recent Labs  02/01/14 1529  02/02/14 0342  02/02/14 1901 02/03/14 0225 02/04/14 0311  HGB  --   < > 8.9*  --   --  9.6* 9.7*  HCT  --   --  28.5*  --   --  31.0* 31.1*  PLT  --   --  116*  --   --  116* 115*  LABPROT 15.1  --   --   --   --   --   --   INR 1.19  --   --   --   --   --   --   HEPARINUNFRC  --   < > 0.22*  < > 0.54 0.45 0.33  CREATININE  --   --  0.92  --   --  0.98 0.90  < > = values in this interval not displayed.  Estimated Creatinine Clearance: 108.9 ml/min (by C-G formula based on Cr of 0.9).   Medications:  Scheduled:  . arformoterol  15 mcg Nebulization Q12H  . budesonide  0.25 mg Nebulization BID  . diltiazem  30 mg Oral BID  . ezetimibe  10 mg Oral QHS  . furosemide  60 mg Intravenous TID  . insulin aspart  0-9 Units Subcutaneous TID WC  . insulin glargine  60 Units Subcutaneous QHS  . loratadine  10 mg Oral Daily  . losartan  25 mg Oral Daily  . metoprolol succinate  25 mg Oral BID  . mupirocin ointment  1 application Nasal BID  . potassium chloride SA  20 mEq Oral Daily  . predniSONE  5 mg Oral Daily  . sodium chloride  3 mL Intravenous Q12H   Infusions:  . dextrose 5 % and 0.45% NaCl 20 mL/hr at 02/01/14 1500  . heparin 1,900 Units/hr (02/04/14 0800)    Assessment: 68 yo m with afib/aflutter PTA.  Patient on heparin while Xarelto on hold for cath/TAVR work up.  HL this AM was therapeutic at 0.33 on 1900 units/hr.  Hgb 9.7 , plts 116, no s/s of bleeding  Goal of Therapy:  Heparin level 0.3-0.7 units/ml Monitor  platelets by anticoagulation protocol: Yes   Plan:  Continue heparin infusion at 1900 units/hr Daily HL and CBC in AM Will follow plans for cath/ATAVR  78, Pharm D 02/04/2014 11:49 AM

## 2014-02-04 NOTE — Progress Notes (Signed)
Patient Name: [name] Date of Encounter: 02/04/2014     Principal Problem:   Acute on chronic diastolic heart failure Active Problems:   Coronary atherosclerosis of artery bypass graft   Atrial flutter   Aortic stenosis   HTN (hypertension)   Hyperlipidemia   Dyspnea   COPD GOLD III with reversibility    CHF (congestive heart failure)   S/P CABG x 5   Morbid obesity   Chronic blood loss anemia   Chronic diastolic congestive heart failure   Iron deficiency anemia   Cirrhosis   Portal hypertension   Chronic pancreatitis    SUBJECTIVE  Feeling the best he has in months. No cp or sob or orthopnea. Able to lie flat  CURRENT MEDS . arformoterol  15 mcg Nebulization Q12H  . budesonide  0.25 mg Nebulization BID  . Chlorhexidine Gluconate Cloth  6 each Topical Q0600  . diltiazem  30 mg Oral BID  . ezetimibe  10 mg Oral QHS  . furosemide  60 mg Intravenous TID  . insulin aspart  0-9 Units Subcutaneous TID WC  . insulin glargine  60 Units Subcutaneous QHS  . loratadine  10 mg Oral Daily  . losartan  25 mg Oral Daily  . metoprolol succinate  25 mg Oral BID  . mupirocin ointment  1 application Nasal BID  . potassium chloride SA  20 mEq Oral Daily  . predniSONE  5 mg Oral Daily  . sodium chloride  3 mL Intravenous Q12H    OBJECTIVE  Filed Vitals:   02/04/14 0423 02/04/14 0700 02/04/14 0719 02/04/14 0907  BP: 106/56  109/58   Pulse: 77  71   Temp: 97.8 F (36.6 C) 98 F (36.7 C)    TempSrc: Oral Oral    Resp: 19  30   Height:      Weight:      SpO2: 98%  92% 97%    Intake/Output Summary (Last 24 hours) at 02/04/14 0957 Last data filed at 02/04/14 0800  Gross per 24 hour  Intake    440 ml  Output   2450 ml  Net  -2010 ml   Filed Weights   02/01/14 0500 02/02/14 0400 02/03/14 0341  Weight: 302 lb 7.5 oz (137.2 kg) 300 lb 14.9 oz (136.5 kg) 298 lb 15.1 oz (135.6 kg)    PHYSICAL EXAM  General: NAD  Resp: faint crackles bilateral bases    Cardiac: irreg, 3/6 systolic mid to late peaking murmur RUSB, no JVD  GI: abdomen soft, NT, ND  MSK: 2+ bilateral LE edema, much imporved  Neuro: no focal deficits  Psych: appropriate affect   Accessory Clinical Findings  CBC  Recent Labs  02/03/14 0225 02/04/14 0311  WBC 5.7 5.0  HGB 9.6* 9.7*  HCT 31.0* 31.1*  MCV 93.1 91.2  PLT 116* 115*   Basic Metabolic Panel  Recent Labs  02/03/14 0225 02/04/14 0311  NA 134* 137  K 4.1 3.7  CL 95* 97  CO2 29 33*  GLUCOSE 174* 108*  BUN 10 9  CREATININE 0.98 0.90  CALCIUM 8.6 8.9   Liver Function Tests  Recent Labs  02/01/14 1529  AST 68*  ALT 47  ALKPHOS 105  BILITOT 0.7  PROT 7.2  ALBUMIN 2.8*    TELE  Tele was not hooked up at the time of my review.  Radiology/Studies  Ct Abdomen W Contrast  02/01/2014   CLINICAL DATA:  Esophageal varices. Portal gastropathy on EGD. Evaluate  for potential cirrhosis.  EXAM: CT ABDOMEN WITH CONTRAST  TECHNIQUE: Multidetector CT imaging of the abdomen was performed using the standard protocol following bolus administration of intravenous contrast.  CONTRAST:  OMNIPAQUE IOHEXOL 300 MG/ML  SOLN  COMPARISON:  CT of the abdomen and pelvis 10/17/2012.  FINDINGS: Lower chest: Trace bilateral pleural effusions and patchy areas of dependent atelectasis in the lung bases bilaterally. Atherosclerotic calcifications in the left circumflex and right coronary arteries.  Hepatobiliary: The liver has a shrunken appearance and nodular contour, compatible with underlying cirrhosis. No definite focal hepatic lesions. No intra or extrahepatic biliary ductal dilatation. Gallbladder is moderately distended. Small calcified gallstones are noted dependently in the gallbladder.  Pancreas: Multifocal calcifications throughout the pancreas, particularly in the head and proximal body, presumably from chronic pancreatitis.  Spleen: The spleen is enlarged measuring 14.5 x 6.9 x 12.4 cm (estimated splenic  volume of 620 mL).  Adrenals/Urinary Tract: Adrenal glands are normal in appearance bilaterally. Multiple sub cm low-attenuation lesions are noted in the kidneys bilaterally which are incompletely characterized, but favored to represent small cysts. In addition, there are larger well-defined low-attenuation lesions in the kidneys bilaterally compatible with simple cysts, largest of which measures 2.4 cm in the anterior aspect of the interpolar region of the left kidney.  Stomach/Bowel: The stomach is normal in appearance. Within the visualized peritoneal cavity there is no pathologic dilatation of the visualized small bowel or colon.  Vascular/Lymphatic: Portal vein is dilated measuring 17 mm in diameter. Extensive atherosclerosis throughout the visualized abdominal vasculature, without evidence of aneurysm or dissection.  Other: Small volume of ascites. Mild diffuse mesenteric edema. No pneumoperitoneum. Tiny ventral hernia containing a small amount of ascites in the epigastric region.  Musculoskeletal: There are no aggressive appearing lytic or blastic lesions noted in the visualized portions of the skeleton.  IMPRESSION: 1. Cirrhotic liver with dilated portal vein (17 mm in diameter) and splenomegaly, indicative of portal hypertension. 2. No discrete hepatic lesion identified at this time to suggest presence of a hepatocellular carcinoma. 3. Small volume of ascites and diffuse mesenteric edema. 4. The appearance of the pancreas suggest sequela of chronic pancreatitis. 5. Cholelithiasis. 6. Trace bilateral pleural effusions with some dependent atelectasis in the lung bases bilaterally. 7. Extensive atherosclerosis, including multivessel coronary artery disease. Assessment for potential risk factor modification, dietary therapy or pharmacologic therapy may be warranted, if clinically indicated.   Electronically Signed   By: Trudie Reed M.D.   On: 02/01/2014 17:06   Dg Chest Port 1v Same Day  01/30/2014    CLINICAL DATA:  CHF.  EXAM: PORTABLE CHEST - 1 VIEW SAME DAY  COMPARISON:  CT and chest radiograph, 01/28/2014  FINDINGS: Changes from CABG surgery are stable. Cardiac silhouette is normal in size and configuration. No convincing mediastinal or hilar masses. There is mild vascular congestion. Right basilar opacity likely combination of a small effusion and atelectasis. No overt pulmonary edema or convincing infiltrate. No pneumothorax.  IMPRESSION: 1. Mild vascular congestion without overt pulmonary edema. No convincing pneumonia. Small effusion with right lung base atelectasis.   Electronically Signed   By: Amie Portland M.D.   On: 01/30/2014 12:53    ASSESSMENT AND PLAN  68 yo male with history of severe aortic valve stenosis, RA, COPD, HTN, DM, CAD, GERD, atrial fibrillation/flutter, obesity and former tobacco abuse who is transferred From [facility] to [facility] for further management of acute CHF and further evaluation of his aortic stenosis with consideration for TAVR.  Acute on chronic diastolic HF  - echo 01/30/14 LVEF 50-55%, severe AS. Diastolic function not described, E/e' 15 consistent with elevated LA pressures  - Gentle diuresis in setting of severe AS, however he was still volume overloaded yesterday so Lasix increased to 60mg  tid.  - neg 1.9L yesterday, net negative 5.3 liters since admission. Kidney function is stable. Continue lasix 60mg  IV tid.  Severe aortic stenosis  - AVA VTI 0.82, mean grad 52  - thought to be high risk by CT surgery for surgical AVR  - he has seen Dr Excell Seltzer previously as an outpatient for TAVR consideration, at that time patient wanted to think things over before proceeding with further workup  - despite newly diagnosed cirrhosis, may still be a TAVR candidate. Dr. Excell Seltzer will be seeing today.   Aflutter  - anemia on xarelto. Admit Hgb 10.1, trended down to 9.2 and then 8.9. Stable and now uptrending. Today 9.7. Off xarelto, now on hep gtt  - has been on rate  control with Toprol and dilt, continue  - continue hep gtt for now, if no further cardiac testing planned will need consideration for restarting long term anticoag.   Leg swelling  - LE [place] prelim results negative for DVT  - likely multifactorial including severe AS, diastolic dysfunction,and cirrhosis  - continue lasix to 60mg  IV tid   Thrombocytopenia  - has had low platelets in the past, down to 83 2 years ago. Admit plt 147, trended down to 116 and now stable @ 115. All cell lines trended down over that time period. Continue to follow at this time. Likely related to cirrhosis, clinically not consistent with HIT   Anemia  - xarelto on hold, on heparin gtt. Patient followed by GI with EGD and colonoscopy. 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  - small varices, not described as bleeding.  - colonscopy no significant findings   Cirrhosis  - new diagnosis this admission, EGD and CT findings described above. Felt cirrhosis contributing to his anemia and thrombocytopenia. 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   CAD: stable. He is s/p CABG with stenting of SVG.  - may need L/RHC if decide to proceed with TAVR   Signed, Dennis Hora PA-C  Pager [phone]  Patient seen, examined. Available data reviewed. Agree with findings, assessment, and plan as outlined by Carlean Jews, PA-C. The patient is independently interviewed and examined. His wife is at the bedside. Exam reveals an alert, oriented, obese male in no distress. Lung fields are clear. Heart is irregularly irregular with a grade 3/6 late peaking harsh systolic crescendo decrescendo murmur best heard at the left lower sternal border. Abdomen is obese and nontender. Extremities have 2+ pretibial edema bilaterally. Laboratory and radiographic data reviewed. Extensive hospital notes reviewed.  The patient is known to me from a previous outpatient  consultation. He has been seen for evaluation of severe aortic stenosis on a background of multiple comorbid medical conditions. He now has progressive symptoms of congestive heart failure. He has been diagnosed with cirrhosis and portal hypertension during this hospital admission. The patient is otherwise functionally independent. He has not had active bleeding, hepatic encephalopathy, or requirement for treatment of ascites. He is clearly not a candidate for conventional surgical aortic valve replacement. However, he may still be a candidate for TAVR. I think it is clear that without treatment for his severe aortic stenosis, he will continue to have significant  and progressive problems related to congestive heart failure. We had extensive discussion about treatment options for severe aortic stenosis and primarily reviewed the pros and cons of TAVR versus palliative medical therapy today. He wishes to proceed with evaluation for TAVR. I think the next step is a right and left heart catheterization with native coronary and graft angiography as well as full hemodynamic assessment. I have reviewed the risks, indications, and alternatives to right and left heart catheterization with the patient and his wife. They understand and agree to proceed. Will continue IV diuresis today as he is still volume overloaded. Will hold Lasix tomorrow morning. He is scheduled for cardiac catheterization at 10:30 AM. Further plans pending the results of his cardiac catheterization.  Tonny Bollman, M.D. 02/04/2014 12:03 PM

## 2014-02-04 NOTE — Progress Notes (Signed)
Medicare Important Message given?  YES (If response is "NO", the following Medicare IM given date fields will be blank) Date Medicare IM given:  02/04/2014 Medicare IM given by:  Linken Mcglothen  

## 2014-02-04 NOTE — Progress Notes (Signed)
Visited pt at suggestion of nursing staff. Chaplain introduced spiritual care and initiated conversation about how pt is doing. Pt stated he is not apprehensive about catheterization, but the second procedure is a little more "serious." He said his wife has been "living in the hospital" with him. We talked briefly about his family and life history, and his current medical conditions. Nurse needed to work with pt so chaplain stepped out. Pt said to "hold me in your prayers." Will follow as needed.   02/04/14 1639  Clinical Encounter Type  Visited With Patient  Visit Type Initial  Referral From Nurse  Consult/Referral To Chaplain  Stress Factors  Patient Stress Factors Health changes   Wille Glaser 02/04/2014 4:45 PM

## 2014-02-04 NOTE — Progress Notes (Signed)
Utilization review completed.  

## 2014-02-05 ENCOUNTER — Encounter (HOSPITAL_COMMUNITY)
Admission: AD | Disposition: A | Discharge status: Home Health | Payer: Self-pay | Source: Other Acute Inpatient Hospital | Attending: Cardiology

## 2014-02-05 DIAGNOSIS — I2510 Atherosclerotic heart disease of native coronary artery without angina pectoris: Secondary | ICD-10-CM

## 2014-02-05 DIAGNOSIS — D649 Anemia, unspecified: Secondary | ICD-10-CM

## 2014-02-05 HISTORY — PX: LEFT AND RIGHT HEART CATHETERIZATION WITH CORONARY/GRAFT ANGIOGRAM: SHX5448

## 2014-02-05 LAB — GLUCOSE, CAPILLARY
GLUCOSE-CAPILLARY: 100 mg/dL — AB (ref 70–99)
GLUCOSE-CAPILLARY: 94 mg/dL (ref 70–99)
GLUCOSE-CAPILLARY: 286 mg/dL — AB (ref 70–99)
Glucose-Capillary: 86 mg/dL (ref 70–99)

## 2014-02-05 LAB — AFP TUMOR MARKER: AFP TUMOR MARKER: 2.2 ng/mL (ref <6.1)

## 2014-02-05 LAB — CBC
HCT: 31 % — ABNORMAL LOW (ref 39.0–52.0)
Hemoglobin: 9.8 g/dL — ABNORMAL LOW (ref 13.0–17.0)
MCH: 28.6 pg (ref 26.0–34.0)
MCHC: 31.6 g/dL (ref 30.0–36.0)
MCV: 90.4 fL (ref 78.0–100.0)
Platelets: 103 10*3/uL — ABNORMAL LOW (ref 150–400)
RBC: 3.43 MIL/uL — AB (ref 4.22–5.81)
RDW: 15.1 % (ref 11.5–15.5)
WBC: 4.4 10*3/uL (ref 4.0–10.5)

## 2014-02-05 LAB — POCT I-STAT 3, VENOUS BLOOD GAS (G3P V)
Acid-Base Excess: 9 mmol/L — ABNORMAL HIGH (ref 0.0–2.0)
Bicarbonate: 34.2 mEq/L — ABNORMAL HIGH (ref 20.0–24.0)
O2 Saturation: 69 %
TCO2: 36 mmol/L (ref 0–100)
pCO2, Ven: 47.9 mmHg (ref 45.0–50.0)
pH, Ven: 7.461 — ABNORMAL HIGH (ref 7.250–7.300)
pO2, Ven: 34 mmHg (ref 30.0–45.0)

## 2014-02-05 LAB — HEPARIN LEVEL (UNFRACTIONATED): Heparin Unfractionated: 0.48 IU/mL (ref 0.30–0.70)

## 2014-02-05 LAB — POCT I-STAT 3, ART BLOOD GAS (G3+)
Acid-Base Excess: 10 mmol/L — ABNORMAL HIGH (ref 0.0–2.0)
Bicarbonate: 34.6 mEq/L — ABNORMAL HIGH (ref 20.0–24.0)
O2 Saturation: 96 %
PH ART: 7.465 — AB (ref 7.350–7.450)
TCO2: 36 mmol/L (ref 0–100)
pCO2 arterial: 48.1 mmHg — ABNORMAL HIGH (ref 35.0–45.0)
pO2, Arterial: 79 mmHg — ABNORMAL LOW (ref 80.0–100.0)

## 2014-02-05 LAB — POCT ACTIVATED CLOTTING TIME: ACTIVATED CLOTTING TIME: 135 s

## 2014-02-05 SURGERY — LEFT AND RIGHT HEART CATHETERIZATION WITH CORONARY/GRAFT ANGIOGRAM
Anesthesia: LOCAL

## 2014-02-05 MED ORDER — NITROGLYCERIN 1 MG/10 ML FOR IR/CATH LAB
INTRA_ARTERIAL | Status: AC
  Filled 2014-02-05: qty 10

## 2014-02-05 MED ORDER — HEPARIN (PORCINE) IN NACL 2-0.9 UNIT/ML-% IJ SOLN
INTRAMUSCULAR | Status: AC
  Filled 2014-02-05: qty 1500

## 2014-02-05 MED ORDER — FENTANYL CITRATE 0.05 MG/ML IJ SOLN
INTRAMUSCULAR | Status: AC
  Filled 2014-02-05: qty 2

## 2014-02-05 MED ORDER — SODIUM CHLORIDE 0.9 % IJ SOLN
3.0000 mL | Freq: Two times a day (BID) | INTRAMUSCULAR | Status: DC
  Administered 2014-02-05: 3 mL via INTRAVENOUS

## 2014-02-05 MED ORDER — SODIUM CHLORIDE 0.9 % IV SOLN
INTRAVENOUS | Status: DC
  Administered 2014-02-05: 14:00:00 via INTRAVENOUS

## 2014-02-05 MED ORDER — MIDAZOLAM HCL 2 MG/2ML IJ SOLN
INTRAMUSCULAR | Status: AC
  Filled 2014-02-05: qty 2

## 2014-02-05 MED ORDER — SODIUM CHLORIDE 0.9 % IJ SOLN: 3.0000 mL | INTRAMUSCULAR | Status: DC | PRN

## 2014-02-05 MED ORDER — SODIUM CHLORIDE 0.9 % IV SOLN: 250.0000 mL | INTRAVENOUS | Status: DC | PRN

## 2014-02-05 MED ORDER — LIDOCAINE HCL (PF) 1 % IJ SOLN
INTRAMUSCULAR | Status: AC
  Filled 2014-02-05: qty 30

## 2014-02-05 MED ORDER — SODIUM CHLORIDE 0.9 % IV SOLN
INTRAVENOUS | Status: AC
  Administered 2014-02-05: 18:00:00 via INTRAVENOUS

## 2014-02-05 MED ORDER — ASPIRIN 81 MG PO CHEW
324.0000 mg | CHEWABLE_TABLET | Freq: Once | ORAL | Status: AC
  Administered 2014-02-05: 324 mg via ORAL
  Filled 2014-02-05: qty 4

## 2014-02-05 NOTE — Interval H&P Note (Signed)
History and Physical Interval Note:  02/05/2014 2:42 PM  [name]  has presented today for surgery, with the diagnosis of cp  The various methods of treatment have been discussed with the patient and family. After consideration of risks, benefits and other options for treatment, the patient has consented to  Procedure(s): LEFT AND RIGHT HEART CATHETERIZATION WITH CORONARY/GRAFT ANGIOGRAM (N/A) as a surgical intervention .  The patient's history has been reviewed, patient examined, no change in status, stable for surgery.  I have reviewed the patient's chart and labs.  Questions were answered to the patient's satisfaction.   Cath Lab Visit (complete for each Cath Lab visit)  Clinical Evaluation Leading to the Procedure:   ACS: No.  Non-ACS:    Anginal Classification: CCS III  Anti-ischemic medical therapy: Maximal Therapy (2 or more classes of medications)  Non-Invasive Test Results: No non-invasive testing performed  Prior CABG: Previous CABG        Theron Arista [facility] 02/05/2014 2:42 PM

## 2014-02-05 NOTE — Progress Notes (Signed)
Patient Name: [name] Date of Encounter: 02/05/2014  Principal Problem:   Acute on chronic diastolic heart failure Active Problems:   Coronary atherosclerosis of artery bypass graft   Atrial flutter   Aortic stenosis   HTN (hypertension)   Hyperlipidemia   Dyspnea   COPD GOLD III with reversibility    CHF (congestive heart failure)   S/P CABG x 5   Morbid obesity   Chronic blood loss anemia   Chronic diastolic congestive heart failure   Iron deficiency anemia   Cirrhosis   Portal hypertension   Chronic pancreatitis    Patient Profile: 68 yo male with history of severe AS, RA, COPD, HTN, DM, CAD, GERD, atrial fibrillation/flutter, obesity, former tobacco abuse, tx From [facility] to [facility] for acute CHF and consideration for TAVR on 09/29. Seen by [facility], possible TAVR, seen by GI for anemia/cirrhosis. 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 Not sure of dry weight.  OBJECTIVE Filed Vitals:   02/04/14 2200 02/04/14 2311 02/05/14 0021 02/05/14 0415  BP: 101/64 96/56 119/60 103/49  Pulse: 73 64 65 78  Temp:  97.7 F (36.5 C)  97.9 F (36.6 C)  TempSrc:  Oral  Oral  Resp:  28 18 11   Height:      Weight:    286 lb 9.6 oz (130 kg)  SpO2:  95% 90% 96%    Intake/Output Summary (Last 24 hours) at 02/05/14 0838 Last data filed at 02/05/14 0500  Gross per 24 hour  Intake    685 ml  Output   3350 ml  Net  -2665 ml   Filed Weights   02/03/14 0341 02/04/14 0800 02/05/14 0415  Weight: 298 lb 15.1 oz (135.6 kg) 292 lb 5.3 oz (132.6 kg) 286 lb 9.6 oz (130 kg)    PHYSICAL EXAM General: Well developed, well nourished, male in no acute distress. Head: Normocephalic, atraumatic.  Neck: Supple without bruits, JVD not elevated but difficult to assess secondary to body habitus. Lungs:  Resp regular and unlabored, CTA. Heart: Irregular R&R, S1, S2, no S3, S4, AS murmur; no rub. Abdomen: Soft, non-tender, non-distended, BS + x 4.  Extremities: No clubbing,  cyanosis, No edema.  Neuro: Alert and oriented X 3. Moves all extremities spontaneously. Psych: Normal affect.  LABS: CBC: Recent Labs  02/04/14 0311 02/05/14 0356  WBC 5.0 4.4  HGB 9.7* 9.8*  HCT 31.1* 31.0*  MCV 91.2 90.4  PLT 115* 103*    Basic Metabolic Panel: Recent Labs  02/03/14 0225 02/04/14 0311  NA 134* 137  K 4.1 3.7  CL 95* 97  CO2 29 33*  GLUCOSE 174* 108*  BUN 10 9  CREATININE 0.98 0.90  CALCIUM 8.6 8.9   TELE:  Atrial flutter, rate generally controlled  Current Medications:  . arformoterol  15 mcg Nebulization Q12H  . budesonide  0.25 mg Nebulization BID  . diltiazem  30 mg Oral BID  . ezetimibe  10 mg Oral QHS  . insulin aspart  0-9 Units Subcutaneous TID WC  . insulin glargine  60 Units Subcutaneous QHS  . loratadine  10 mg Oral Daily  . losartan  25 mg Oral Daily  . metoprolol succinate  25 mg Oral BID  . potassium chloride SA  20 mEq Oral Daily  . predniSONE  5 mg Oral Daily  . sodium chloride  3 mL Intravenous Q12H  . sodium chloride  3 mL Intravenous Q12H   . [START ON  02/06/2014] sodium chloride    . dextrose 5 % and 0.45% NaCl 20 mL/hr at 02/01/14 1500  . heparin 1,900 Units/hr (02/05/14 0500)    ASSESSMENT AND PLAN  68 yo male with history of severe aortic valve stenosis, RA, COPD, HTN, DM, CAD, GERD, atrial fibrillation/flutter, obesity and former tobacco abuse who is transferred From [facility] to [facility] for further management of acute CHF and further evaluation of his aortic stenosis with consideration for TAVR.   Acute on chronic diastolic HF  - echo 01/30/14 LVEF 50-55%, severe AS. Diastolic function not described, E/e' 15 consistent with elevated LA pressures  - Needed diuresis in setting of severe AS, Lasix increased to 60mg  IV tid on 10/04, currently d/c'd for cath.  - net negative 7.9 liters since admission. Kidney function is stable. Restart Lasix after cath, may be able to change to PO.   Severe aortic stenosis  - AVA VTI  0.82, mean grad 52  - thought to be high risk by CT surgery for surgical AVR  - he has seen Dr 12/04 previously as an outpatient for TAVR consideration, at that time patient wanted to think things over before proceeding with further workup  - despite newly diagnosed cirrhosis, may still be a TAVR candidate. Dr. Excell Seltzer to discuss with family after cath.   Aflutter  - anemia on xarelto. Admit Hgb 10.1, trended down to 9.2 and then 8.9. Stable and now uptrending. Today 9.8. Off xarelto, now on hep gtt  - has been on rate control with Toprol and dilt, continue  - continue hep gtt for now, if no further cardiac testing planned will need consideration for restarting long term anticoag.   Leg swelling  - LE Excell Seltzer prelim results negative for DVT  - likely multifactorial including severe AS, diastolic dysfunction,and cirrhosis  - Restart Lasix after cath  Thrombocytopenia  - has had low platelets in the past, down to 83, 2 years ago. Admit plt 147, trended down to 116 and now stable @ 103. All cell lines trended down over that time period. Continue to follow at this time. Likely related to cirrhosis, clinically not consistent with HIT   Anemia  - xarelto on hold, on heparin gtt. Patient followed by GI with EGD and colonoscopy. 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  - small varices, not described as bleeding.  - colonscopy no significant findings   Cirrhosis  - new diagnosis this admission, EGD and CT findings described above. Felt cirrhosis contributing to his anemia and thrombocytopenia. 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   CAD: stable. He is s/p CABG with stenting of SVG.  -  L/RHC today to help decide to proceed with TAVR   Signed, [place] , PA-C 8:38 AM 02/05/2014   History and all data above reviewed.  Patient examined.  I agree with the findings as above.  The patient exam reveals 04/07/2014  ,   Lungs: Clear  ,  Abd: Positive bowel sounds, no rebound no guarding, Ext Mild edema  .  All available labs, radiology testing, previous records reviewed. Agree with documented assessment and plan. AS:  Cath today per Dr. [id number].  Anemia:  Hgb stable since admission.  Will resume Xarelto after cath.   Excell Seltzer Telesia Ates  12:16 PM  02/05/2014

## 2014-02-05 NOTE — CV Procedure (Signed)
    Cardiac Catheterization Procedure Note  Name: [name] MRN: [id number] DOB: 17-Jun-1945  Procedure: Right Heart Cath, Left Heart Cath, Selective Coronary Angiography, SVG and LIMA angiography  Indication: 68 yo WM with history of CAD s/p CABG present with progressive dyspnea. He has severe Aortic stenosis by Echo. He was in Afib during procedure.   Procedural Details: The right groin was prepped, draped, and anesthetized with 1% lidocaine. Using the modified Seldinger technique a 5 French sheath was placed in the right femoral artery and a 7 French sheath was placed in the right femoral vein. A Swan-Ganz catheter was used for the right heart catheterization. Standard protocol was followed for recording of right heart pressures and sampling of oxygen saturations. Fick cardiac output was calculated. Standard Judkins catheters were used for selective coronary angiography and graft angiography. There were no immediate procedural complications. The patient was transferred to the post catheterization recovery area for further monitoring.  Procedural Findings: Hemodynamics RA */14 mean 10 mm Hg RV 34/10 mm hg PA 33/9 mean 22 mm Hg PCWP */21 mean 14 mm Hg LV 146/13 mm Hg AO 115/60 mean 83 mm Hg  AV mean gradient 21 mm Hg. AV area 2.3 cm squared. Index 0.97  Oxygen saturations: PA 69% AO 96%  Cardiac Output (Fick) 9.0 L/min  Cardiac Index (Fick) 3.7 L/min/meter squared.   Coronary angiography: Coronary dominance: right  Left mainstem: Normal  Left anterior descending (LAD):  Diffuse disease in the proximal vessel to 50-60%. Competitive flow from IMA graft.   Left circumflex (LCx): Patent in the AV groove. The first and second OMs are occluded.   Right coronary artery (RCA): 100% occlusion proximally.  LIMA to the LAD is widely patent.   SVG to the RCA is widely patent. There is a 30% stenosis in the mid graft. Following this there is a patent stent.   SVG sequential to  OM 1 and OM 2 is patent there is 80% stenosis in the continuation segment of the SVG to OM 2.   SVG to the diagonal is patent. There is a focal 80% stenosis in the proximal SVG.   Left ventriculography: Not done   Contrast: 85 cc  Radiation dose/Fluoro time: 11 minutes  Final Conclusions:   1. Severe 3 vessel obstructive CAD 2. Patent LIMA to the LAD 3. Patent SVG to RCA 4. Patent SVG to diagonal with 80% proximal stenosis. 5. Patent SVG to the OM1 and OM2. 80% stenosis in continuation of graft to OM2.  6. AV mean gradient of 20 mm Hg. Data may be influenced by presence of Afib. 7. Normal right heart pressures.   Recommendations: Will discuss findings with primary team.  Judi Cong 02/05/2014, 3:34 PM

## 2014-02-05 NOTE — H&P (View-Only) (Signed)
   Patient Name: [name] Date of Encounter: 02/05/2014  Principal Problem:   Acute on chronic diastolic heart failure Active Problems:   Coronary atherosclerosis of artery bypass graft   Atrial flutter   Aortic stenosis   HTN (hypertension)   Hyperlipidemia   Dyspnea   COPD GOLD III with reversibility    CHF (congestive heart failure)   S/P CABG x 5   Morbid obesity   Chronic blood loss anemia   Chronic diastolic congestive heart failure   Iron deficiency anemia   Cirrhosis   Portal hypertension   Chronic pancreatitis    Patient Profile: 68 yo male with history of severe AS, RA, COPD, HTN, DM, CAD, GERD, atrial fibrillation/flutter, obesity, former tobacco abuse, tx From [facility] to [facility] for acute CHF and consideration for TAVR on 09/29. Seen by [facility]CTS, possible TAVR, seen by GI for anemia/cirrhosis. 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 Not sure of dry weight.  OBJECTIVE Filed Vitals:   02/04/14 2200 02/04/14 2311 02/05/14 0021 02/05/14 0415  BP: 101/64 96/56 119/60 103/49  Pulse: 73 64 65 78  Temp:  97.7 F (36.5 C)  97.9 F (36.6 C)  TempSrc:  Oral  Oral  Resp:  28 18 11  Height:      Weight:    286 lb 9.6 oz (130 kg)  SpO2:  95% 90% 96%    Intake/Output Summary (Last 24 hours) at 02/05/14 0838 Last data filed at 02/05/14 0500  Gross per 24 hour  Intake    685 ml  Output   3350 ml  Net  -2665 ml   Filed Weights   02/03/14 0341 02/04/14 0800 02/05/14 0415  Weight: 298 lb 15.1 oz (135.6 kg) 292 lb 5.3 oz (132.6 kg) 286 lb 9.6 oz (130 kg)    PHYSICAL EXAM General: Well developed, well nourished, male in no acute distress. Head: Normocephalic, atraumatic.  Neck: Supple without bruits, JVD not elevated but difficult to assess secondary to body habitus. Lungs:  Resp regular and unlabored, CTA. Heart: Irregular R&R, S1, S2, no S3, S4, AS murmur; no rub. Abdomen: Soft, non-tender, non-distended, BS + x 4.  Extremities: No clubbing,  cyanosis, No edema.  Neuro: Alert and oriented X 3. Moves all extremities spontaneously. Psych: Normal affect.  LABS: CBC: Recent Labs  02/04/14 0311 02/05/14 0356  WBC 5.0 4.4  HGB 9.7* 9.8*  HCT 31.1* 31.0*  MCV 91.2 90.4  PLT 115* 103*    Basic Metabolic Panel: Recent Labs  02/03/14 0225 02/04/14 0311  NA 134* 137  K 4.1 3.7  CL 95* 97  CO2 29 33*  GLUCOSE 174* 108*  BUN 10 9  CREATININE 0.98 0.90  CALCIUM 8.6 8.9   TELE:  Atrial flutter, rate generally controlled  Current Medications:  . arformoterol  15 mcg Nebulization Q12H  . budesonide  0.25 mg Nebulization BID  . diltiazem  30 mg Oral BID  . ezetimibe  10 mg Oral QHS  . insulin aspart  0-9 Units Subcutaneous TID WC  . insulin glargine  60 Units Subcutaneous QHS  . loratadine  10 mg Oral Daily  . losartan  25 mg Oral Daily  . metoprolol succinate  25 mg Oral BID  . potassium chloride SA  20 mEq Oral Daily  . predniSONE  5 mg Oral Daily  . sodium chloride  3 mL Intravenous Q12H  . sodium chloride  3 mL Intravenous Q12H   . [START ON   02/06/2014] sodium chloride    . dextrose 5 % and 0.45% NaCl 20 mL/hr at 02/01/14 1500  . heparin 1,900 Units/hr (02/05/14 0500)    ASSESSMENT AND PLAN  68 yo male with history of severe aortic valve stenosis, RA, COPD, HTN, DM, CAD, GERD, atrial fibrillation/flutter, obesity and former tobacco abuse who is transferred From [facility] to [facility] for further management of acute CHF and further evaluation of his aortic stenosis with consideration for TAVR.   Acute on chronic diastolic HF  - echo 01/30/14 LVEF 50-55%, severe AS. Diastolic function not described, E/e' 15 consistent with elevated LA pressures  - Needed diuresis in setting of severe AS, Lasix increased to 60mg  IV tid on 10/04, currently d/c'd for cath.  - net negative 7.9 liters since admission. Kidney function is stable. Restart Lasix after cath, may be able to change to PO.   Severe aortic stenosis  - AVA VTI  0.82, mean grad 52  - thought to be high risk by CT surgery for surgical AVR  - he has seen Dr 12/04 previously as an outpatient for TAVR consideration, at that time patient wanted to think things over before proceeding with further workup  - despite newly diagnosed cirrhosis, may still be a TAVR candidate. Dr. Excell Seltzer to discuss with family after cath.   Aflutter  - anemia on xarelto. Admit Hgb 10.1, trended down to 9.2 and then 8.9. Stable and now uptrending. Today 9.8. Off xarelto, now on hep gtt  - has been on rate control with Toprol and dilt, continue  - continue hep gtt for now, if no further cardiac testing planned will need consideration for restarting long term anticoag.   Leg swelling  - LE Excell Seltzer prelim results negative for DVT  - likely multifactorial including severe AS, diastolic dysfunction,and cirrhosis  - Restart Lasix after cath  Thrombocytopenia  - has had low platelets in the past, down to 83, 2 years ago. Admit plt 147, trended down to 116 and now stable @ 103. All cell lines trended down over that time period. Continue to follow at this time. Likely related to cirrhosis, clinically not consistent with HIT   Anemia  - xarelto on hold, on heparin gtt. Patient followed by GI with EGD and colonoscopy. 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  - small varices, not described as bleeding.  - colonscopy no significant findings   Cirrhosis  - new diagnosis this admission, EGD and CT findings described above. Felt cirrhosis contributing to his anemia and thrombocytopenia. 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   CAD: stable. He is s/p CABG with stenting of SVG.  -  L/RHC today to help decide to proceed with TAVR   Signed, [place] , PA-C 8:38 AM 02/05/2014   History and all data above reviewed.  Patient examined.  I agree with the findings as above.  The patient exam reveals 04/07/2014  ,   Lungs: Clear  ,  Abd: Positive bowel sounds, no rebound no guarding, Ext Mild edema  .  All available labs, radiology testing, previous records reviewed. Agree with documented assessment and plan. AS:  Cath today per Dr. [id number].  Anemia:  Hgb stable since admission.  Will resume Xarelto after cath.   Excell Seltzer Nahla Lukin  12:16 PM  02/05/2014

## 2014-02-05 NOTE — Progress Notes (Signed)
ANTICOAGULATION CONSULT NOTE - Follow Up Consult  Pharmacy Consult for heparin Indication: atrial fibrillation  No Known Allergies  Patient Measurements: Height: 5\' 11"  (180.3 cm) Weight: 286 lb 9.6 oz (130 kg) IBW/kg (Calculated) : 75.3 Heparin Dosing Weight: 107 kg  Vital Signs: Temp: 97.7 F (36.5 C) (10/06 0700) Temp Source: Oral (10/06 0700) BP: 103/49 mmHg (10/06 0415) Pulse Rate: 78 (10/06 0415)  Labs:  Recent Labs  02/03/14 0225 02/04/14 0311 02/05/14 0356  HGB 9.6* 9.7* 9.8*  HCT 31.0* 31.1* 31.0*  PLT 116* 115* 103*  HEPARINUNFRC 0.45 0.33 0.48  CREATININE 0.98 0.90  --     Estimated Creatinine Clearance: 106.5 ml/min (by C-G formula based on Cr of 0.9).   Medications:  Scheduled:  . arformoterol  15 mcg Nebulization Q12H  . budesonide  0.25 mg Nebulization BID  . diltiazem  30 mg Oral BID  . ezetimibe  10 mg Oral QHS  . insulin aspart  0-9 Units Subcutaneous TID WC  . insulin glargine  60 Units Subcutaneous QHS  . loratadine  10 mg Oral Daily  . losartan  25 mg Oral Daily  . metoprolol succinate  25 mg Oral BID  . potassium chloride SA  20 mEq Oral Daily  . predniSONE  5 mg Oral Daily  . sodium chloride  3 mL Intravenous Q12H  . sodium chloride  3 mL Intravenous Q12H   Infusions:  . [START ON 02/06/2014] sodium chloride    . dextrose 5 % and 0.45% NaCl 20 mL/hr at 02/01/14 1500  . heparin 1,900 Units/hr (02/05/14 1100)    Assessment: 68 yo m with afib/aflutter PTA.  Patient on heparin while Xarelto on hold for cath/TAVR work up.  HL this AM was therapeutic at 0.48 on 1900 units/hr.  Hgb 9.8, plts 103, no s/s of bleeding. Noted for cath today  Goal of Therapy:  Heparin level 0.3-0.7 units/ml Monitor platelets by anticoagulation protocol: Yes   Plan:  Continue heparin infusion at 1900 units/hr Daily HL and CBC in AM Will follow plans post cath  78, Pharm D 02/05/2014 11:40 AM

## 2014-02-06 LAB — CBC
HCT: 28.9 % — ABNORMAL LOW (ref 39.0–52.0)
Hemoglobin: 9.1 g/dL — ABNORMAL LOW (ref 13.0–17.0)
MCH: 28.6 pg (ref 26.0–34.0)
MCHC: 31.5 g/dL (ref 30.0–36.0)
MCV: 90.9 fL (ref 78.0–100.0)
PLATELETS: 111 10*3/uL — AB (ref 150–400)
RBC: 3.18 MIL/uL — ABNORMAL LOW (ref 4.22–5.81)
RDW: 15.1 % (ref 11.5–15.5)
WBC: 5.5 10*3/uL (ref 4.0–10.5)

## 2014-02-06 LAB — GLUCOSE, CAPILLARY
GLUCOSE-CAPILLARY: 215 mg/dL — AB (ref 70–99)
Glucose-Capillary: 170 mg/dL — ABNORMAL HIGH (ref 70–99)
Glucose-Capillary: 187 mg/dL — ABNORMAL HIGH (ref 70–99)
Glucose-Capillary: 219 mg/dL — ABNORMAL HIGH (ref 70–99)

## 2014-02-06 MED ORDER — FUROSEMIDE 40 MG PO TABS
40.0000 mg | ORAL_TABLET | Freq: Two times a day (BID) | ORAL | Status: DC
  Administered 2014-02-06 – 2014-02-07 (×2): 40 mg via ORAL
  Filled 2014-02-06 (×4): qty 1

## 2014-02-06 NOTE — Anesthesia Postprocedure Evaluation (Signed)
  Anesthesia Post-op Note  Patient: [name]  Procedure(s) Performed: Procedure(s): ESOPHAGOGASTRODUODENOSCOPY (EGD) WITH PROPOFOL (N/A) COLONOSCOPY WITH PROPOFOL (N/A)  Patient Location: PACU  Anesthesia Type:MAC  Level of Consciousness: awake and alert   Airway and Oxygen Therapy: Patient Spontanous Breathing  Post-op Pain: none  Post-op Assessment: Post-op Vital signs reviewed  Post-op Vital Signs: stable  Last Vitals:  Filed Vitals:   02/06/14 0730  BP: 118/54  Pulse: 80  Temp: 36.8 C  Resp:     Complications: No apparent anesthesia complications

## 2014-02-06 NOTE — Progress Notes (Signed)
[address]       [name] [address]             [phone]     CARDIOTHORACIC SURGERY PROGRESS NOTE  1 Day Post-Op  S/P Procedure(s) (LRB): LEFT AND RIGHT HEART CATHETERIZATION WITH CORONARY/GRAFT ANGIOGRAM (N/A)  Subjective: Feels okay and notably much better than he did at the time of admission.  Dyspnea reportedly at or slightly better than his chronic baseline.  Objective: Vital signs in last 24 hours: Temp:  [97.3 F (36.3 C)-98.3 F (36.8 C)] 98.3 F (36.8 C) (10/07 0730) Pulse Rate:  [69-106] 80 (10/07 0730) Cardiac Rhythm:  [-] Atrial flutter (10/07 0910) Resp:  [18-22] 18 (10/07 0730) BP: (109-120)/(53-78) 118/54 mmHg (10/07 0730) SpO2:  [93 %-97 %] 94 % (10/07 0730) Weight:  [131.906 kg (290 lb 12.8 oz)] 131.906 kg (290 lb 12.8 oz) (10/07 0409)  Physical Exam:  Rhythm:   Aflutter  Breath sounds: Bibasilar crackles, no wheezes  Heart sounds:  Irregular w/ systolic murmur  Incisions:  n/a  Abdomen:  soft  Extremities:  Warm, well-perfused, 3+ bilateral LE edema    Intake/Output from previous day: 10/06 0701 - 10/07 0700 In: 690 [I.V.:690] Out: 450 [Urine:450] Intake/Output this shift:    Lab Results:  Recent Labs  02/05/14 0356 02/06/14 0259  WBC 4.4 5.5  HGB 9.8* 9.1*  HCT 31.0* 28.9*  PLT 103* 111*   BMET:  Recent Labs  02/04/14 0311  NA 137  K 3.7  CL 97  CO2 33*  GLUCOSE 108*  BUN 9  CREATININE 0.90  CALCIUM 8.9    CBG (last 3)   Recent Labs  02/05/14 1645 02/05/14 2125 02/06/14 0728  GLUCAP 86 286* 170*   PT/INR:  No results found for this basename: LABPROT, INR,  in the last 72 hours  CXR:  N/A  Cardiac Catheterization Procedure Note  Name: [name]  MRN: [id number]  DOB: 23-Aug-1945  Procedure: Right Heart Cath, Left Heart Cath, Selective Coronary Angiography, SVG and LIMA angiography  Indication: 68 yo WM with history of CAD s/p CABG present with progressive dyspnea. He has severe  Aortic stenosis by Echo. He was in Afib during procedure.  Procedural Details: The right groin was prepped, draped, and anesthetized with 1% lidocaine. Using the modified Seldinger technique a 5 French sheath was placed in the right femoral artery and a 7 French sheath was placed in the right femoral vein. A Swan-Ganz catheter was used for the right heart catheterization. Standard protocol was followed for recording of right heart pressures and sampling of oxygen saturations. Fick cardiac output was calculated. Standard Judkins catheters were used for selective coronary angiography and graft angiography. There were no immediate procedural complications. The patient was transferred to the post catheterization recovery area for further monitoring.  Procedural Findings:  Hemodynamics  RA */14 mean 10 mm Hg  RV 34/10 mm hg  PA 33/9 mean 22 mm Hg  PCWP */21 mean 14 mm Hg  LV 146/13 mm Hg  AO 115/60 mean 83 mm Hg  AV mean gradient 21 mm Hg. AV area 2.3 cm squared. Index 0.97  Oxygen saturations:  PA 69%  AO 96%  Cardiac Output (Fick) 9.0 L/min  Cardiac Index (Fick) 3.7 L/min/meter squared.  Coronary angiography:  Coronary dominance: right  Left mainstem: Normal  Left anterior descending (LAD): Diffuse disease in the proximal vessel to 50-60%. Competitive flow from IMA graft.  Left circumflex (LCx): Patent  in the AV groove. The first and second OMs are occluded.  Right coronary artery (RCA): 100% occlusion proximally.  LIMA to the LAD is widely patent.  SVG to the RCA is widely patent. There is a 30% stenosis in the mid graft. Following this there is a patent stent.  SVG sequential to OM 1 and OM 2 is patent there is 80% stenosis in the continuation segment of the SVG to OM 2.  SVG to the diagonal is patent. There is a focal 80% stenosis in the proximal SVG.  Left ventriculography: Not done  Contrast: 85 cc  Radiation dose/Fluoro time: 11 minutes  Final Conclusions:  1. Severe 3 vessel  obstructive CAD  2. Patent LIMA to the LAD  3. Patent SVG to RCA  4. Patent SVG to diagonal with 80% proximal stenosis.  5. Patent SVG to the OM1 and OM2. 80% stenosis in continuation of graft to OM2.  6. AV mean gradient of 20 mm Hg. Data may be influenced by presence of Afib.  7. Normal right heart pressures.  Recommendations: Will discuss findings with primary team.  Judi Cong  02/05/2014, 3:34 PM    Assessment/Plan: S/P Procedure(s) (LRB): LEFT AND RIGHT HEART CATHETERIZATION WITH CORONARY/GRAFT ANGIOGRAM (N/A)  I have reviewed [name]'s cath results and discussed the findings with the patient, his wife and Dr Excell Seltzer.  Although there has been some progression of vein graft disease in the SVG to the diagonal branch, this vessel is quite small.  The stenosis in the continuation of the SVG between OM1 and OM2 appears unchanged since 2010 and the SVG to the RCA looks quite good, as does the LIMA graft.  The patient has not had symptoms of angina.  I would favor continued medical Rx for CAD unless he were to develop angina.  Given the patient's numerous comorbidities and the recent discovery of significant underlying liver disease, I would not consider [name] a candidate for AVR + redo CABG under any circumstances.    I do think it might be reasonable to consider transcatheter aortic valve replacement as a means to definitively manage his aortic stenosis, and we could proceed with cardiac-gated CTA of heart and CTA of chest/abd/pelvis while he is here to investigate whether or not TAVR might be technically feasible.  However, a plan to address his persistent atrial flutter and how to manage his anticoagulation therapy needs to be addressed.  I spent in excess of 30 minutes during the conduct of this hospital encounter and >50% of this time involved direct face-to-face encounter with the patient for counseling and/or coordination of their care.   Dennis  [name] 02/06/2014 11:18 AM

## 2014-02-06 NOTE — Progress Notes (Signed)
    Subjective:  No chest pain or shortness of breath at rest. DOE persists.  Objective:  Vital Signs in the last 24 hours: Temp:  [97.3 F (36.3 C)-98.3 F (36.8 C)] 97.7 F (36.5 C) (10/07 1200) Pulse Rate:  [70-106] 80 (10/07 0730) Resp:  [18-22] 18 (10/07 0730) BP: (109-120)/(54-78) 118/54 mmHg (10/07 0730) SpO2:  [94 %-97 %] 94 % (10/07 0730) Weight:  [290 lb 12.8 oz (131.906 kg)] 290 lb 12.8 oz (131.906 kg) (10/07 0409)  Intake/Output from previous day: 10/06 0701 - 10/07 0700 In: 690 [I.V.:690] Out: 450 [Urine:450]  Physical Exam: Pt is alert and oriented, obese male in NAD HEENT: normal Neck: JVP - modestly elevated Lungs: CTA bilaterally CV: irregularly irregular with grade 3/6 systolic murmur at the LSB Abd: soft, NT, obese Ext: 1+ edema, distal pulses intact and equal, right groin site clear Skin: warm/dry no rash   Lab Results:  Recent Labs  02/05/14 0356 02/06/14 0259  WBC 4.4 5.5  HGB 9.8* 9.1*  PLT 103* 111*    Recent Labs  02/04/14 0311  NA 137  K 3.7  CL 97  CO2 33*  GLUCOSE 108*  BUN 9  CREATININE 0.90   No results found for this basename: TROPONINI, CK, MB,  in the last 72 hours  Tele: Atrial flutter  Assessment/Plan:  1. Acute on chronic diastolic heart failure 2. Severe aortic stenosis 3. Atrial flutter 4. Liver Cirrhosis with portal HTN 5. Fe deficiency anemia  Cath study carefully reviewed and discussed with Dr Cornelius Moras. I agree that medical therapy is best approach considering his lack of angina. He is clinically improved with IV diuresis. With low BP and normal LV function, I think it is best to stop losartan. Will continue low doses of beta-blocker and Ca channel blocker to help with rate control of atrial flutter. I do not think he should be anticoagulated with cirrhosis, varices, and anemia. Will stop this and start him on ASA 81 mg. Plan to do gated cardiac CTA and CTA chest/abdomen/pelvis tomorrow, then d/c home in next  24-48 hours. Anticipate TAVR in upcoming weeks as long as he remains stable. Will discuss treatment for his atrial flulter with Dr Purvis Sheffield.  Tonny Bollman, M.D. 02/06/2014, 1:22 PM

## 2014-02-07 ENCOUNTER — Encounter (HOSPITAL_COMMUNITY): Payer: Self-pay | Admitting: Radiology

## 2014-02-07 ENCOUNTER — Inpatient Hospital Stay (HOSPITAL_COMMUNITY): Payer: Medicare Other

## 2014-02-07 LAB — BASIC METABOLIC PANEL
Anion gap: 9 (ref 5–15)
BUN: 11 mg/dL (ref 6–23)
CO2: 29 meq/L (ref 19–32)
Calcium: 8.9 mg/dL (ref 8.4–10.5)
Chloride: 97 mEq/L (ref 96–112)
Creatinine, Ser: 0.75 mg/dL (ref 0.50–1.35)
GFR calc Af Amer: >90 mL/min (ref >90)
Glucose, Bld: 186 mg/dL — ABNORMAL HIGH (ref 70–99)
Potassium: 4.3 mEq/L (ref 3.7–5.3)
Sodium: 135 mEq/L — ABNORMAL LOW (ref 137–147)

## 2014-02-07 LAB — GLUCOSE, CAPILLARY
GLUCOSE-CAPILLARY: 120 mg/dL — AB (ref 70–99)
GLUCOSE-CAPILLARY: 210 mg/dL — AB (ref 70–99)
Glucose-Capillary: 223 mg/dL — ABNORMAL HIGH (ref 70–99)

## 2014-02-07 LAB — CBC
HCT: 28.7 % — ABNORMAL LOW (ref 39.0–52.0)
Hemoglobin: 9.1 g/dL — ABNORMAL LOW (ref 13.0–17.0)
MCH: 28.5 pg (ref 26.0–34.0)
MCHC: 31.7 g/dL (ref 30.0–36.0)
MCV: 90 fL (ref 78.0–100.0)
PLATELETS: 137 10*3/uL — AB (ref 150–400)
RBC: 3.19 MIL/uL — ABNORMAL LOW (ref 4.22–5.81)
RDW: 15.1 % (ref 11.5–15.5)
WBC: 6.2 10*3/uL (ref 4.0–10.5)

## 2014-02-07 MED ORDER — FUROSEMIDE 40 MG PO TABS: 80.0000 mg | ORAL_TABLET | Freq: Every day | ORAL | Status: DC

## 2014-02-07 MED ORDER — METOPROLOL TARTRATE 1 MG/ML IV SOLN
5.0000 mg | Freq: Once | INTRAVENOUS | Status: AC
  Administered 2014-02-07: 5 mg via INTRAVENOUS

## 2014-02-07 MED ORDER — METOPROLOL TARTRATE 1 MG/ML IV SOLN
INTRAVENOUS | Status: AC
  Administered 2014-02-07: 5 mg via INTRAVENOUS
  Filled 2014-02-07: qty 10

## 2014-02-07 MED ORDER — IOHEXOL 350 MG/ML SOLN
100.0000 mL | Freq: Once | INTRAVENOUS | Status: AC | PRN
  Administered 2014-02-07: 100 mL via INTRAVENOUS

## 2014-02-07 MED ORDER — METOPROLOL TARTRATE 1 MG/ML IV SOLN: 5.0000 mg | INTRAVENOUS | Status: DC | PRN

## 2014-02-07 MED ORDER — METOPROLOL SUCCINATE ER 25 MG PO TB24: 25.0000 mg | ORAL_TABLET | Freq: Two times a day (BID) | ORAL | Status: DC

## 2014-02-07 MED ORDER — ASPIRIN 81 MG PO TABS: 81.0000 mg | ORAL_TABLET | Freq: Every day | ORAL | Status: AC

## 2014-02-07 MED ORDER — METFORMIN HCL 1000 MG PO TABS: 1000.0000 mg | ORAL_TABLET | Freq: Two times a day (BID) | ORAL | Status: DC

## 2014-02-07 MED ORDER — IOHEXOL 350 MG/ML SOLN: 100.0000 mL | Freq: Once | INTRAVENOUS | Status: DC | PRN

## 2014-02-07 NOTE — Care Management Note (Addendum)
    Page 1 of 1   02/08/2014     1:54:15 PM CARE MANAGEMENT NOTE 02/08/2014  Patient:  [name], [name]   Account Number:  [id number]  Date Initiated:  01/30/2014  Documentation initiated by:  Donn Pierini  Subjective/Objective Assessment:   Pt admitted with GIB and CHF, ? need for TAVR     Action/Plan:   PTA pt lived at home-- NCM to follow for possible d/c needs ?HH-HF need   Anticipated DC Date:  02/07/2014   Anticipated DC Plan:  HOME W HOME HEALTH SERVICES      DC Planning Services  CM consult      Choice offered to / List presented to:             Status of service:  Completed, signed off Medicare Important Message given?  YES (If response is "NO", the following Medicare IM given date fields will be blank) Date Medicare IM given:  02/01/2014 Medicare IM given by:  [facility] Date Additional Medicare IM given:  02/07/2014 Additional Medicare IM given by:  Donn Pierini  Discharge Disposition:  HOME/SELF CARE  Per UR Regulation:  Reviewed for med. necessity/level of care/duration of stay  If discussed at Long Length of Stay Meetings, dates discussed:   02/05/2014  02/07/2014    Comments:  02/07/14- 1050- Donn Pierini RN, BSN [phone] Pt for possible d/c later today after gated cardiac CTA and CTA chest/abdomen/pelvis- spoke with pt and wife at beside- pt states he feels good- wife at home to assist- discussed HH-RN for disease management prior to coming back for surgery in a few weeks- pt and wife are open to [facility] if MD feels it is needed- MD please order HH-RN if you feel pt needs this prior to surgery for disease management- NCM to follow if [facility] ordered and will arrange if needed.  02/05/14- 1150- Donn Pierini RN, BSN [phone] Pt for Heart Cath today, ?TAVR vs more palliative plan MD to discuss with family following cath today- NCM to continue to follow for d/c needs ?HH-RN for HF management

## 2014-02-07 NOTE — Progress Notes (Signed)
Utilization review completed.  

## 2014-02-07 NOTE — Discharge Summary (Signed)
CARDIOLOGY DISCHARGE SUMMARY   Patient ID: [name] MRN: [id number] DOB/AGE: 69-Feb-1947 69 y.o.  Admit date: 01/29/2014 Discharge date: 02/07/2014  PCP: Donzetta Sprung, MD Primary Cardiologist: Dr. Purvis Sheffield  Primary Discharge Diagnosis:   Acute on chronic diastolic heart failure - weight 292 lbs at d/c.  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 male with a history of CAD, severe AS, RA, COPD, HTN, DM, CAD, GERD, atrial fibrillation/flutter, obesity, former tobacco abuse, tx From [facility] to [facility] for acute CHF on 01/29/2014.  Acute on chronic diastolic CHF: He was diuresed with IV Lasix, and his respiratory status improved. His weight decreased by approximately 11 pounds during his hospital stay. His weight at discharge was 292 pounds. His intake/output was negative by 10.5 L during his hospital stay. His family states they will get a good set of scales and he agrees to continue weighing himself daily at discharge. The importance of tracking his daily weight and contacting us if it very significantly was emphasized.  Coronary artery disease: His cardiac enzymes were negative for MI. Once his respiratory status improved, right and left heart  catheterization was performed to further evaluate his respiratory cardiac status. The results are below. The findings were discussed with the primary team but currently medical therapy is recommended.  Atrial flutter: Mr. Legge was in atrial flutter on admission. His rate was controlled with a low dose of a beta blocker and Cardizem.  Chronic anticoagulation: He was on Xarelto prior to admission. Pending procedures, the Xarelto was held and he was placed on heparin.  Aortic stenosis-severe: He was evaluated for TAVR during his hospital stay. He was seen by Dr. Cornelius Moras, and may be a TAVR candidate. Currently the plan is to optimize his multiple medical problems and make a final decision as an outpatient.  Hypertension: Currently his blood pressure is under good control.  Anemia: He was felt to have chronic blood loss anemia, but the symptoms worsened. On admission, his H&H was 10.1/32.2. He decreased to 8.9/28.5. He also had abnormal liver function tests.  A GI consult was called. He was seen by Dr. Bosie Clos and an EGD plus a colonoscopy were performed. The EGD showed small distal esophageal varices and mild portal gastropathy concerning for portal hypertension. The colonoscopy showed sigmoid diverticulosis and small internal hemorrhoids. No anemia source was seen.  Cirrhosis: His LFTs were abnormal and Dr. Bosie Clos evaluated this. He was felt to have cirrhosis, likely from [place] syndrome. This will be followed as an outpatient.  On 10/08, he was seen by Dr. Excell Seltzer, and all data were reviewed. His respiratory status and anemia were stable. CTA of the heart, plus chest/abd/pelvis were performed and information was sent for f/u appointments  with the TAVR clinic. No further inpatient workup was indicated and he was considered stable for discharge, to follow up as an outpatient.  Labs:   Lab Results  Component Value Date   WBC 6.2 02/07/2014   HGB 9.1* 02/07/2014   HCT 28.7* 02/07/2014   MCV 90.0  02/07/2014   PLT 137* 02/07/2014    Recent Labs Lab 02/01/14 1529  02/07/14 1300  NA  --   < > 135*  K  --   < > 4.3  CL  --   < > 97  CO2  --   < > 29  BUN  --   < > 11  CREATININE  --   < > 0.75  CALCIUM  --   < > 8.9  PROT 7.2  --   --   BILITOT 0.7  --   --   ALKPHOS 105  --   --   ALT 47  --   --   AST 68*  --   --   GLUCOSE  --   < > 186*  < > = values in this interval not displayed.  Radiology: Ct Abdomen W Contrast 02/01/2014   CLINICAL DATA:  Esophageal varices. Portal gastropathy on EGD. Evaluate for potential cirrhosis.  EXAM: CT ABDOMEN WITH CONTRAST  TECHNIQUE: Multidetector CT imaging of the abdomen was performed using the standard protocol following bolus administration of intravenous contrast.  CONTRAST:  OMNIPAQUE IOHEXOL 300 MG/ML  SOLN  COMPARISON:  CT of the abdomen and pelvis 10/17/2012.  FINDINGS: Lower chest: Trace bilateral pleural effusions and patchy areas of dependent atelectasis in the lung bases bilaterally. Atherosclerotic calcifications in the left circumflex and right coronary arteries.  Hepatobiliary: The liver has a shrunken appearance and nodular contour, compatible with underlying cirrhosis. No definite focal hepatic lesions. No intra or extrahepatic biliary ductal dilatation. Gallbladder is moderately distended. Small calcified gallstones are noted dependently in the gallbladder.  Pancreas: Multifocal calcifications throughout the pancreas, particularly in the head and proximal body, presumably from chronic pancreatitis.  Spleen: The spleen is enlarged measuring 14.5 x 6.9 x 12.4 cm (estimated splenic volume of 620 mL).  Adrenals/Urinary Tract: Adrenal glands are normal in appearance bilaterally. Multiple sub cm low-attenuation lesions are noted in the kidneys bilaterally which are incompletely characterized, but favored to represent small cysts. In addition, there are larger well-defined low-attenuation lesions in the kidneys bilaterally compatible with  simple cysts, largest of which measures 2.4 cm in the anterior aspect of the interpolar region of the left kidney.  Stomach/Bowel: The stomach is normal in appearance. Within the visualized peritoneal cavity there is no pathologic dilatation of the visualized small bowel or colon.  Vascular/Lymphatic: Portal vein is dilated measuring 17 mm in diameter. Extensive atherosclerosis throughout the visualized abdominal vasculature, without evidence of aneurysm or dissection.  Other: Small volume of ascites. Mild diffuse mesenteric edema. No pneumoperitoneum. Tiny ventral hernia containing a small amount of ascites in the epigastric region.  Musculoskeletal: There are no aggressive appearing lytic or blastic lesions noted in the visualized portions of the skeleton.  IMPRESSION: 1. Cirrhotic liver with dilated portal vein (17 mm in diameter) and splenomegaly, indicative of portal hypertension. 2. No discrete hepatic lesion identified at this time to suggest presence of a hepatocellular carcinoma. 3. Small volume of ascites and diffuse mesenteric edema. 4. The appearance of the pancreas suggest sequela of chronic pancreatitis. 5. Cholelithiasis. 6. Trace bilateral pleural effusions with some dependent atelectasis in the lung bases  bilaterally. 7. Extensive atherosclerosis, including multivessel coronary artery disease. Assessment for potential risk factor modification, dietary therapy or pharmacologic therapy may be warranted, if clinically indicated.   Electronically Signed   By: Trudie Reed M.D.   On: 02/01/2014 17:06   Dg Chest Port 1v Same Day 01/30/2014   CLINICAL DATA:  CHF.  EXAM: PORTABLE CHEST - 1 VIEW SAME DAY  COMPARISON:  CT and chest radiograph, 01/28/2014  FINDINGS: Changes from CABG surgery are stable. Cardiac silhouette is normal in size and configuration. No convincing mediastinal or hilar masses. There is mild vascular congestion. Right basilar opacity likely combination of a small effusion and  atelectasis. No overt pulmonary edema or convincing infiltrate. No pneumothorax.  IMPRESSION: 1. Mild vascular congestion without overt pulmonary edema. No convincing pneumonia. Small effusion with right lung base atelectasis.   Electronically Signed   By: Amie Portland M.D.   On: 01/30/2014 12:53   Cardiac CT, W and without Contrast: 02/07/2014 FINDINGS:  Aortic and branch vessel atherosclerosis. Small new mediastinal  lymph nodes are not pathologically enlarged by size criteria.  Suspected upper abdominal ventral hernia containing adipose tissue  and probably fluid, image 107 of series 201.  Severe centrilobular emphysema. Bilateral airway thickening. Mild  lingular and bilateral lower lobe atelectasis. Scarring medially at  the right lung apex. Probable mucus in the trachea, image 9 series  512.  Perihepatic ascites. Scattered lymph nodes in the gastrohepatic  ligament. Nodular contour of liver compatible with cirrhosis.  NONCARDIAC IMPRESSION:  1. Severe centrilobular emphysema.  2. Bilateral airway thickening favoring bronchitis.  3. Scattered lingular and bilateral lower lobe atelectasis.  4. Perihepatic ascites.  5. Cirrhosis with scattered lymph nodes in the gastrohepatic  ligament which are probably reactive.  6. Aortic and branch vessel atherosclerosis.  7. Upper abdominal ventral hernia containing adipose tissue and  probably a small amount of fluid.    Cardiac Cath: 02/05/2014 Procedural Findings:  Hemodynamics  RA */14 mean 10 mm Hg  RV 34/10 mm hg  PA 33/9 mean 22 mm Hg  PCWP */21 mean 14 mm Hg  LV 146/13 mm Hg  AO 115/60 mean 83 mm Hg  AV mean gradient 21 mm Hg. AV area 2.3 cm squared. Index 0.97  Oxygen saturations:  PA 69%  AO 96%  Cardiac Output (Fick) 9.0 L/min  Cardiac Index (Fick) 3.7 L/min/meter squared.  Coronary angiography:  Coronary dominance: right  Left mainstem: Normal  Left anterior descending (LAD): Diffuse disease in the proximal vessel to  50-60%. Competitive flow from IMA graft.  Left circumflex (LCx): Patent in the AV groove. The first and second OMs are occluded.  Right coronary artery (RCA): 100% occlusion proximally.  LIMA to the LAD is widely patent.  SVG to the RCA is widely patent. There is a 30% stenosis in the mid graft. Following this there is a patent stent.  SVG sequential to OM 1 and OM 2 is patent there is 80% stenosis in the continuation segment of the SVG to OM 2.  SVG to the diagonal is patent. There is a focal 80% stenosis in the proximal SVG.  Left ventriculography: Not done  Contrast: 85 cc  Radiation dose/Fluoro time: 11 minutes  Final Conclusions:  1. Severe 3 vessel obstructive CAD  2. Patent LIMA to the LAD  3. Patent SVG to RCA  4. Patent SVG to diagonal with 80% proximal stenosis.  5. Patent SVG to the OM1 and OM2. 80% stenosis in continuation of graft to  OM2.  6. AV mean gradient of 20 mm Hg. Data may be influenced by presence of Afib.  7. Normal right heart pressures.   EKG: 01/30/2014 SR, no acute ischemic changes Vent. rate 92 BPM PR interval * ms QRS duration 110 ms QT/QTc 358/442 ms P-R-T axes * 21 29  Echo: 01/30/2014 Study Conclusions - Left ventricle: The cavity size was normal. Wall thickness was normal. Systolic function was normal. The estimated ejection fraction was in the range of 50% to 55%. Wall motion was normal; there were no regional wall motion abnormalities. - Aortic valve: There was severe stenosis. Valve area (VTI): 0.82 cm^2. Valve area (Vmax): 0.74 cm^2. Valve area (Vmean): 0.75 cm^2. - Left atrium: The atrium was moderately dilated. - Right atrium: The atrium was moderately dilated. - Pulmonary arteries: PA peak pressure: 33 mm Hg (S).  LE Venous Dopplers: 02/01/2014 Summary: - No evidence of deep vein thrombosis involving the right lower extremity. - No evidence of deep vein thrombosis involving the left lower extremity. - No evidence of Baker&'s cyst  on the right or left. Other specific details can be found in the table(s) above.   FOLLOW UP PLANS AND APPOINTMENTS No Known Allergies   Medication List    STOP taking these medications       clopidogrel 75 MG tablet  Commonly known as:  PLAVIX     rivaroxaban 20 MG Tabs tablet  Commonly known as:  XARELTO      TAKE these medications       ascorbic acid 500 MG tablet  Commonly known as:  VITAMIN C  Take 500 mg by mouth daily.     aspirin 81 MG tablet  Take 1 tablet (81 mg total) by mouth daily.     budesonide 0.25 MG/2ML nebulizer solution  Commonly known as:  PULMICORT  Take 0.25 mg by nebulization 2 (two) times daily.     cetirizine 10 MG tablet  Commonly known as:  ZYRTEC  Take 10 mg by mouth at bedtime.     co-enzyme Q-10 30 MG capsule  Take 30 mg by mouth at bedtime.     COMBIVENT 18-103 MCG/ACT inhaler  Generic drug:  albuterol-ipratropium  Inhale 2 puffs into the lungs every 6 (six) hours as needed for wheezing or shortness of breath.     ipratropium-albuterol 0.5-2.5 (3) MG/3ML Soln  Commonly known as:  DUONEB  Take 3 mLs by nebulization 4 (four) times daily as needed (for shortness of breath).     desonide 0.05 % cream  Commonly known as:  DESOWEN  Apply 1 application topically 2 (two) times daily.     diltiazem 30 MG tablet  Commonly known as:  CARDIZEM  Take 1 tablet (30 mg total) by mouth 2 (two) times daily.     ezetimibe 10 MG tablet  Commonly known as:  ZETIA  Take 10 mg by mouth at bedtime.     formoterol 20 MCG/2ML nebulizer solution  Commonly known as:  PERFOROMIST  Take 2 mLs (20 mcg total) by nebulization 2 (two) times daily. Use in nebulizer twice daily perfectly regularly     furosemide 40 MG tablet  Commonly known as:  LASIX  Take 2 tablets (80 mg total) by mouth daily. Take 2 tablets twice a day for 3 days, then 2 tablets daily. Or as to rectum.     guaifenesin 100 MG/5ML syrup  Commonly known as:  ROBITUSSIN  Take 200 mg by  mouth 3 (three) times daily  as needed for cough.     insulin NPH-regular Human (70-30) 100 UNIT/ML injection  Commonly known as:  NOVOLIN 70/30  Inject 80 Units into the skin 2 (two) times daily.     ketoconazole 2 % cream  Commonly known as:  NIZORAL  Apply 1 application topically 2 (two) times daily as needed for irritation.     loratadine 10 MG tablet  Commonly known as:  CLARITIN  Take 10 mg by mouth every morning.     losartan 100 MG tablet  Commonly known as:  COZAAR  Take 50 mg by mouth at bedtime.     metFORMIN 1000 MG tablet  Commonly known as:  GLUCOPHAGE  Take 1 tablet (1,000 mg total) by mouth 2 (two) times daily. Hold for 48 hours, restart on 02/10/2014.     metoprolol succinate 50 MG 24 hr tablet  Commonly known as:  TOPROL-XL  Take 50 mg by mouth 2 (two) times daily.     multivitamin tablet  Take 1 tablet by mouth daily.     oxyCODONE-acetaminophen 5-325 MG per tablet  Commonly known as:  PERCOCET/ROXICET  Take 1 tablet by mouth every 4 (four) hours as needed for moderate pain.     potassium chloride SA 20 MEQ tablet  Commonly known as:  KLOR-CON M20  Take 1 tablet (20 mEq total) by mouth daily.     predniSONE 5 MG tablet  Commonly known as:  DELTASONE  Take 5 mg by mouth daily.        Discharge Instructions   (HEART FAILURE PATIENTS) Call MD:  Anytime you have any of the following symptoms: 1) 3 pound weight gain in 24 hours or 5 pounds in 1 week 2) shortness of breath, with or without a dry hacking cough 3) swelling in the hands, feet or stomach 4) if you have to sleep on extra pillows at night in order to breathe.    Complete by:  As directed      Diet - low sodium heart healthy    Complete by:  As directed      Diet Carb Modified    Complete by:  As directed      Increase activity slowly    Complete by:  As directed           Follow-up Information   Follow up with Tonny Bollman, MD. (The office will call.)    Specialty:  Cardiology    Contact information:   [address] [place] [address] [phone]       BRING ALL MEDICATIONS WITH YOU TO FOLLOW UP APPOINTMENTS  Time spent with patient to include physician time: 48 min Signed: Theodore Demark, PA-C 02/07/2014, 5:31 PM Co-Sign MD

## 2014-02-07 NOTE — Discharge Instructions (Signed)
PLEASE REMEMBER TO BRING ALL OF YOUR MEDICATIONS TO EACH OF YOUR FOLLOW-UP OFFICE VISITS. ° °PLEASE ATTEND ALL SCHEDULED FOLLOW-UP APPOINTMENTS.  ° °Activity: Increase activity slowly as tolerated. You may shower, but no soaking baths (or swimming) for 1 week. No driving for 2 days. No lifting over 5 lbs for 1 week. No sexual activity for 1 week.  ° °You May Return to Work: in 1 week (if applicable) ° °Wound Care: You may wash cath site gently with soap and water. Keep cath site clean and dry. If you notice pain, swelling, bleeding or pus at your cath site, please call [phone]. ° ° ° °Cardiac Cath Site Care °Refer to this sheet in the next few weeks. These instructions provide you with information on caring for yourself after your procedure. Your caregiver may also give you more specific instructions. Your treatment has been planned according to current medical practices, but problems sometimes occur. Call your caregiver if you have any problems or questions after your procedure. °HOME CARE INSTRUCTIONS °· You may shower 24 hours after the procedure. Remove the bandage (dressing) and gently wash the site with plain soap and water. Gently pat the site dry.  °· Do not apply powder or lotion to the site.  °· Do not sit in a bathtub, swimming pool, or whirlpool for 5 to 7 days.  °· No bending, squatting, or lifting anything over 10 pounds (4.5 kg) as directed by your caregiver.  °· Inspect the site at least twice daily.  °· Do not drive home if you are discharged the same day of the procedure. Have someone else drive you.  °· You may drive 24 hours after the procedure unless otherwise instructed by your caregiver.  °What to expect: °· Any bruising will usually fade within 1 to 2 weeks.  °· Blood that collects in the tissue (hematoma) may be painful to the touch. It should usually decrease in size and tenderness within 1 to 2 weeks.  °SEEK IMMEDIATE MEDICAL CARE IF: °· You have unusual pain at the site or down the  affected limb.  °· You have redness, warmth, swelling, or pain at the site.  °· You have drainage (other than a small amount of blood on the dressing).  °· You have chills.  °· You have a fever or persistent symptoms for more than 72 hours.  °· You have a fever and your symptoms suddenly get worse.  °· Your leg becomes pale, cool, tingly, or numb.  °· You have heavy bleeding from the site. Hold pressure on the site.  °Document Released: 05/22/2010 Document Revised: 04/08/2011 Document Reviewed: 05/22/2010 °[employer]® Patient Information ©2012 [employer]. ° °

## 2014-02-07 NOTE — Progress Notes (Signed)
Results for JAKARIE, PEMBER (MRN [id number]) as of 02/07/2014 14:08  Ref. Range 02/06/2014 11:36 02/06/2014 15:17 02/06/2014 21:32 02/07/2014 07:36 02/07/2014 10:55  Glucose-Capillary Latest Range: 70-99 mg/dL 528 (H) 413 (H) 244 (H) 120 (H) 223 (H)  Noted that postprandial CBGs are greater than 180 mg/dl.  Takes 70/30 insulin 80 units BID at home.  Recommend either changing to 70/30 while in the hospital, or continuing Lantus 60 units, Novolog SENSITIVE correction scale, and adding Novolog meal coverage of 5 units TID if postprandial blood sugars continue to be elevated. Smith Mince RN BSN CDE

## 2014-02-07 NOTE — Progress Notes (Signed)
Pt discharged via wheel chair  At ~1945 with RN to home. Reviewed discharge information with patient and spouse.

## 2014-02-07 NOTE — Progress Notes (Signed)
Patient Name: [name] Date of Encounter: 02/07/2014  Principal Problem:   Acute on chronic diastolic heart failure Active Problems:   Coronary atherosclerosis of artery bypass graft   Atrial flutter   Aortic stenosis   HTN (hypertension)   Hyperlipidemia   Dyspnea   COPD GOLD III with reversibility    CHF (congestive heart failure)   S/P CABG x 5   Morbid obesity   Chronic blood loss anemia   Chronic diastolic congestive heart failure   Iron deficiency anemia   Cirrhosis   Portal hypertension   Chronic pancreatitis    Patient Profile: 68 yo male with history of severe AS, RA, COPD, HTN, DM, CAD, GERD, atrial fibrillation/flutter, obesity, former tobacco abuse, tx From [facility] to [facility] for acute CHF and consideration for TAVR on 09/29. Seen by TCTS, possible TAVR, seen by GI for anemia/cirrhosis. Likely has NASH   SUBJECTIVE: DOE with minimal exertion, acute on chronic, does not weight at home but will start, still with LE edema.   OBJECTIVE Filed Vitals:   02/06/14 2305 02/07/14 0321 02/07/14 0700 02/07/14 0736  BP: 118/59 101/54  122/53  Pulse: 65 66  76  Temp: 97 F (36.1 C) 98.4 F (36.9 C) 97.8 F (36.6 C)   TempSrc: Axillary Oral Oral   Resp: 23 20  18   Height:  5\' 11"  (1.803 m)    Weight:  292 lb 6.4 oz (132.632 kg)    SpO2: 94% 96%  99%    Intake/Output Summary (Last 24 hours) at 02/07/14 1029 Last data filed at 02/07/14 0700  Gross per 24 hour  Intake    620 ml  Output   2375 ml  Net  -1755 ml   Filed Weights   02/05/14 0415 02/06/14 0409 02/07/14 0321  Weight: 286 lb 9.6 oz (130 kg) 290 lb 12.8 oz (131.906 kg) 292 lb 6.4 oz (132.632 kg)    PHYSICAL EXAM General: Well developed, well nourished, male in no acute distress. Head: Normocephalic, atraumatic.  Neck: Supple without bruits, JVD minimal elevation Lungs:  Resp regular and unlabored, scattered rales, no wheezing. Heart: Irreg R&R, S1, S2, no S3, S4, 3/6 murmur; no rub. Abdomen:  Soft, non-tender, non-distended, BS + x 4.  Extremities: No clubbing, cyanosis, 2+ edema.  Neuro: Alert and oriented X 3. Moves all extremities spontaneously. Psych: Normal affect.  LABS: CBC: Recent Labs  02/06/14 0259 02/07/14 0230  WBC 5.5 6.2  HGB 9.1* 9.1*  HCT 28.9* 28.7*  MCV 90.9 90.0  PLT 111* 137*    TELE:  Atrial flutter, rate generally controlled.   Current Medications:  . arformoterol  15 mcg Nebulization Q12H  . budesonide  0.25 mg Nebulization BID  . diltiazem  30 mg Oral BID  . ezetimibe  10 mg Oral QHS  . furosemide  40 mg Oral BID  . insulin aspart  0-9 Units Subcutaneous TID WC  . insulin glargine  60 Units Subcutaneous QHS  . loratadine  10 mg Oral Daily  . metoprolol succinate  25 mg Oral BID  . potassium chloride SA  20 mEq Oral Daily  . predniSONE  5 mg Oral Daily  . sodium chloride  3 mL Intravenous Q12H   . dextrose 5 % and 0.45% NaCl 20 mL/hr at 02/06/14 0104    ASSESSMENT AND PLAN: Principal Problem:   Acute on chronic diastolic heart failure - wt down 10 lbs and I/O neg by 9.4 L since admit. However, still  has sig LE edema. MD advise on a dose of IV Lasix.   Otherwise, medically stable. Consider d/c once testing performed if respiratory status and volume status OK. Active Problems:   Coronary atherosclerosis of artery bypass graft    Atrial flutter   Aortic stenosis   HTN (hypertension)   Hyperlipidemia   Dyspnea   COPD GOLD III with reversibility    CHF (congestive heart failure)   S/P CABG x 5   Morbid obesity   Chronic blood loss anemia   Chronic diastolic congestive heart failure   Iron deficiency anemia   Cirrhosis   Portal hypertension   Chronic pancreatitis  10/07: Cath study carefully reviewed and discussed with Dr Cornelius Moras. I agree that medical therapy is best approach considering his lack of angina. He is clinically improved with IV diuresis. With low BP and normal LV function, I think it is best to stop losartan. Will  continue low doses of beta-blocker and Ca channel blocker to help with rate control of atrial flutter. I do not think he should be anticoagulated with cirrhosis, varices, and anemia. Will stop this and start him on ASA 81 mg. Plan to do gated cardiac CTA and CTA chest/abdomen/pelvis tomorrow, then d/c home in next 24-48 hours. Anticipate TAVR in upcoming weeks as long as he remains stable. Will discuss treatment for his atrial flulter with Dr Purvis Sheffield.   Signed, Theodore Demark , PA-C 10:29 AM 02/07/2014  Patient seen, examined. Available data reviewed. Agree with findings, assessment, and plan as outlined by Theodore Demark, PA-C. Exam reveals alert, obese male in NAD. Lungs with coarse BS bilaterally. CV irregular with 3/6 harsh systolic murmur at the LSB. Ext: 2+ edema.   Plan:   CTA heart today (TAVR planning)  CTA chest/abdomen/pelvis today (TAVR planning)  Start ASA 81 mg daily (stopping anticoagulation in setting of cirrhosis/portal HTN/varices/anemia/thormbocytopenia)  Increase oral diuretics to furosemide 80 mg BID x 3 days, then 80 mg daily  Follow-up 2 weeks with BMET prior to office visit  Tonny Bollman, M.D. 02/07/2014 2:51 PM

## 2014-02-08 NOTE — Anesthesia Postprocedure Evaluation (Signed)
  Anesthesia Post-op Note  Patient: [name]  Procedure(s) Performed: Procedure(s): ESOPHAGOGASTRODUODENOSCOPY (EGD) WITH PROPOFOL (N/A) COLONOSCOPY WITH PROPOFOL (N/A)  Patient Location: PACU and Endoscopy Unit  Anesthesia Type:General  Level of Consciousness: awake and alert   Airway and Oxygen Therapy: Patient Spontanous Breathing  Post-op Pain: none  Post-op Assessment: Post-op Vital signs reviewed  Post-op Vital Signs: stable  Last Vitals:  Filed Vitals:   02/07/14 1400  BP:   Pulse: 46  Temp:   Resp: 22    Complications: No apparent anesthesia complications

## 2014-02-08 NOTE — Addendum Note (Signed)
Addendum created 02/08/14 1535 by Sharee Holster, MD   Modules edited: Notes Section   Notes Section:  File: [id number]

## 2014-02-21 ENCOUNTER — Telehealth: Payer: Self-pay | Admitting: Cardiovascular Disease

## 2014-02-21 ENCOUNTER — Encounter: Payer: Self-pay | Admitting: Cardiovascular Disease

## 2014-02-21 ENCOUNTER — Ambulatory Visit (INDEPENDENT_AMBULATORY_CARE_PROVIDER_SITE_OTHER): Payer: Medicare Other | Admitting: Cardiovascular Disease

## 2014-02-21 VITALS — BP 118/74 | HR 96 | Ht 71.0 in | Wt 282.0 lb

## 2014-02-21 DIAGNOSIS — I350 Nonrheumatic aortic (valve) stenosis: Secondary | ICD-10-CM

## 2014-02-21 DIAGNOSIS — I25810 Atherosclerosis of coronary artery bypass graft(s) without angina pectoris: Secondary | ICD-10-CM

## 2014-02-21 NOTE — Progress Notes (Signed)
Background: The patient is followed for aortic stenosis, atrial flutter, and coronary artery disease with previous CABG. He underwent CABG in 1998 with a LIMA to LAD, vein graft to diagonal, sequential vein graft to first and second OM branches of the left circumflex, and vein graft to the distal RCA. He also has undergone PCI with stenting of the vein graft to the RCA in 2010. 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   HPI:  68 year old gentleman presenting for followup evaluation. The patient was recently hospitalized with acute on chronic diastolic congestive heart failure. During that hospitalization he was noted to be anemic and was transfused one unit of packed red blood cells. He was evaluated by Dr. Cornelius Moras during his hospitalization and continues to undergo workup for TAVR to address his severe aortic stenosis.   The patient had been improving since his hospitalization. He received IV diuretics while hospitalized, followed by an increased dose of oral Lasix at discharge. His weight is down about 20 pounds in his breathing is improved. However, approximately 48 hours ago he became acutely short of breath and stridorous. He was taken to [facility] and admitted for overnight observation. The patient was diagnosed with laryngospasm and was treated with steroids. He was started on proton next for laryngopharyngeal reflux. The patient underwent flexible laryngoscopy and was noted to have aryteynoid fixation and decreased mobility of the vocal cords, but no evidence of a vocal cord lesion. A CT scan of the neck showed no evidence of mass lesion in the superior mediastinum or neck. The patient has ENT followup next Monday. He feels much better since his hospitalization and denies any shortness of  breath at rest. He has not had chest pain or syncope. He has no other complaints at present.  Studies:  Cardiac CTA: FINDINGS:  Aortic Valve: Severely thickened and calcified with moderately  restricted leaflet opening.  Aorta: Normal size, mild diffuse calcifications, no dissection.  Sinotubular Junction: 27 x 26 mm  Ascending Thoracic Aorta: 31 x 31 mm  Aortic Arch: 27 x 26 mm  Descending Thoracic Aorta: 28 x 27 mm  Sinus of Valsalva Measurements:  Non-coronary: 31 mm  Right -coronary: 30 mm  Left -coronary: 31 mm  Coronary Artery Height above Annulus:  Left Main: 10 mm  Right Coronary: 16 mm  Virtual Basal Annulus Measurements:  Maximum/Minimum Diameter: 28 x 25 mm  Perimeter: 99 mm  Area: 560 mm2  Coronary Arteries: The study was performed without use of NTG and is  suboptimal for coronary evaluation.  Optimum Fluoroscopic Angle for Delivery: RAO 1 CRA 1  IMPRESSION:  1. Severely thickened and calcified aortic valve with annular  measurements suitable for delivery of soft 29 mm Edward SAPIEN XT  valve.  2. Optimum Fluoroscopic Angle for Delivery: RAO 1 CRA 1  3. Sheath to tip distance sufficient for transaortic approach (76  mm).  [name]   CTA Chest/Abdomen/Pelvis: VASCULAR MEASUREMENTS PERTINENT TO TAVR:  AORTA:  Minimal Aortic Diameter - 14 x 12 mm  Severity of Aortic Calcification - moderate to severe  RIGHT PELVIS:  Right Common Iliac Artery -  Minimal Diameter - 7.6 x 5.9 mm  Tortuosity - Mild  Calcification - Severe  Right External Iliac Artery -  Minimal Diameter - 7.8 x 4.2 mm  Tortuosity - Mild  Calcification - Moderate  Right Common Femoral Artery -  Minimal Diameter - 8.7 x 4.9 mm  Tortuosity - Mild  Calcification - Severe  LEFT PELVIS:  Left Common Iliac Artery -  Minimal Diameter - 6.9 x 6.2 mm  Tortuosity - Mild  Calcification - Severe  Left External Iliac Artery -  Minimal Diameter - 6.6 x 4.8 mm  Tortuosity - Mild    Calcification - Moderate  Left Common Femoral Artery -  Minimal Diameter - 5.2 x 4.5 mm  Tortuosity - Mild  Calcification - Severe  Review of the MIP images confirms the above findings.  IMPRESSION:  1. Findings and measurements pertinent to potential TAVR procedure,  as detailed above. Although the patient does have extensive pelvic  arterial calcification, and does not have suitable access from the  left side, there appears to be potentially suitable access on the  right side.  2. Mild diffuse bronchial wall thickening with mild centrilobular  and paraseptal emphysema; imaging findings suggestive of underlying  COPD.  3. Stigmata of cirrhosis with portal hypertension and associated  splenomegaly, as above.  4. Cholelithiasis without evidence to suggest acute cholecystitis at  this time.  5. Mild colonic diverticulosis without findings to suggest acute  diverticulitis at this time.  6. 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   Outpatient Encounter Prescriptions as of 02/21/2014  Medication Sig  . albuterol-ipratropium (COMBIVENT) 18-103 MCG/ACT inhaler Inhale 2 puffs into the lungs every 6 (six) hours as needed for wheezing or shortness of breath.   Marland Kitchen ascorbic acid (VITAMIN C) 500 MG tablet Take 500 mg by mouth daily.   Marland Kitchen aspirin 81 MG tablet Take 1 tablet (81 mg total) by mouth daily.  . budesonide (PULMICORT) 0.25 MG/2ML nebulizer solution Take 0.25 mg by nebulization 2 (two) times daily.  . cetirizine (ZYRTEC) 10 MG tablet Take 10 mg by mouth at bedtime.  Marland Kitchen co-enzyme Q-10 30 MG capsule Take 30 mg by mouth at bedtime.   Marland Kitchen desonide (DESOWEN) 0.05 % cream Apply 1 application topically 2 (two) times daily.  Marland Kitchen diltiazem (CARDIZEM) 30 MG tablet Take 1 tablet (30 mg total) by mouth 2 (two) times daily.  Marland Kitchen  ezetimibe (ZETIA) 10 MG tablet Take 10 mg by mouth at bedtime.   . formoterol (PERFOROMIST) 20 MCG/2ML nebulizer solution Take 2 mLs (20 mcg total) by nebulization 2 (two) times daily. Use in nebulizer twice daily perfectly regularly  . furosemide (LASIX) 40 MG tablet Take 2 tablets (80 mg total) by mouth daily. Take 2 tablets twice a day for 3 days, then 2 tablets daily. Or as to rectum.  Marland Kitchen guaifenesin (ROBITUSSIN) 100 MG/5ML syrup Take 200 mg by mouth 3 (three) times daily as needed for cough.  . insulin NPH-insulin regular (NOVOLIN 70/30) (70-30) 100 UNIT/ML injection Inject 80 Units into the skin 2 (two) times daily.   Marland Kitchen ipratropium-albuterol (DUONEB) 0.5-2.5 (3) MG/3ML SOLN Take 3 mLs by nebulization 4 (four) times daily as needed (for shortness of breath).   Marland Kitchen ketoconazole (NIZORAL) 2 % cream Apply 1 application topically 2 (two) times daily as needed for irritation.   Marland Kitchen loratadine (CLARITIN) 10 MG tablet Take 10 mg by mouth every morning.   Marland Kitchen losartan (COZAAR) 100 MG tablet Take 50 mg by mouth at bedtime.   . metoprolol succinate (TOPROL-XL) 25  MG 24 hr tablet Take 1 tablet (25 mg total) by mouth 2 (two) times daily.  . Multiple Vitamin (MULTIVITAMIN) tablet Take 1 tablet by mouth daily.    Marland Kitchen oxyCODONE-acetaminophen (PERCOCET) 5-325 MG per tablet Take 1 tablet by mouth every 4 (four) hours as needed for moderate pain.   . potassium chloride SA (KLOR-CON M20) 20 MEQ tablet Take 1 tablet (20 mEq total) by mouth daily.  . predniSONE (DELTASONE) 5 MG tablet Take 5 mg by mouth daily.   . metFORMIN (GLUCOPHAGE) 1000 MG tablet Take 1 tablet (1,000 mg total) by mouth 2 (two) times daily. Hold for 48 hours, restart on 02/10/2014.    No Known Allergies  Past Medical History  Diagnosis Date  . Rheumatoid arthritis(714.0)     chronic Prednisone  . COPD (chronic obstructive pulmonary disease)   . Elevated liver function tests   . Hypertension   . Diabetes mellitus   . Aortic stenosis     a. 2D  ECHO: 01/30/2014: ef 50-55%. No RWMAs. Severe GE:XBMWU area (VTI): 0.82 cm^2. Valve area (Vmax): 0.74 cm^2. Valve area (Vmean): 0.75 Cm^2. Mod LA/RA dilation. PA peak pressure 33  . CAD (coronary artery disease)   . GERD (gastroesophageal reflux disease)   . Chronic atrial flutter   . 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 Degenerative disc disease, cervical   . Degenerative disc disease, lumbar 2010  . Morbid obesity   . Coronary atherosclerosis of artery bypass graft 2010  . Acute on chronic diastolic heart failure 02/01/2014  . Chronic diastolic congestive heart failure 02/01/2014  . Epistaxis     Developed on Xarelto therapy  . Cirrhosis 02/01/2014  . Portal hypertension 02/01/2014  . Chronic pancreatitis 02/01/2014    Noted on CT scan    family history includes Asthma in his child; Cancer in his father; Emphysema in his brother; Hypertension in his mother; Liver cancer in his paternal grandfather; Rheum arthritis in his maternal aunt; Stroke in his brother.   ROS: Negative except as per HPI  BP 118/74  Pulse 96  Ht 5\' 11"  (1.803 m)  Wt 282 lb (127.914 kg)  BMI 39.35 kg/m2  PHYSICAL EXAM: Pt is alert and oriented, pleasant obese male in NAD HEENT: normal Neck: JVP - normal, carotids 2+= without bruits Lungs: CTA bilaterally CV: Irregular with grade 3/6 harsh systolic crescendo decrescendo murmur at the left sternal border Abd: soft, NT, Positive BS, no hepatomegaly, obese Ext: 1+ pretibial edema bilaterally, distal pulses intact and equal Skin: warm/dry no rash  ASSESSMENT AND PLAN: 68 year old gentleman with severe symptomatic aortic stenosis and a multitude of cardiac and general medical problems as outlined above. He has been evaluated by the multidisciplinary heart team and we have had extensive discussion about his treatment options. I have specifically reviewed the pros and cons of  conventional aortic valve replacement, TAVR, and palliative medical therapy with the patient and his wife. I have personally reviewed his echo, cardiac catheterization, and CT studies. He appears to have  aortic valve anatomy suitable for a 29 mm Sapien XT. His pelvic arterial anatomy is borderline on the right side and not suitable for TAVR access on the left. We will review his studies as a team to determine best option for access (Transfemoral versus Transapical).   Following the decision to proceed with transcatheter aortic valve replacement, a  discussion has been held regarding what types of management strategies would be attempted intraoperatively in the event of life-threatening complications, including whether or not the patient would be considered a candidate for the use of cardiopulmonary bypass and/or conversion to open sternotomy for attempted surgical intervention.  The patient has been advised of a variety of complications that might develop including but not limited to risks of death, stroke, paravalvular leak, aortic dissection or other major vascular complications, aortic annulus rupture, device embolization, cardiac rupture or perforation, mitral regurgitation, acute myocardial infarction, arrhythmia, heart block or bradycardia requiring permanent pacemaker placement, congestive heart failure, respiratory failure, renal failure, pneumonia, infection, other late complications related to structural valve deterioration or migration, or other complications that might ultimately cause a temporary or permanent loss of functional independence or other long term morbidity.  The patient provides full informed consent for the procedure as described and all questions were answered.  The patient is tentatively scheduled for TAVR on 11/3 pending stability of his upper airway symptoms with ENT follow-up next week and surgical second opinion by Dr Laneta Simmers 10/23. Extensive discussion with the patient and his  wife today - all questions answered.  Tonny Bollman MD 02/21/2014 12:47 PM

## 2014-02-21 NOTE — Patient Instructions (Signed)
You are scheduled to see Dr Laneta Simmers tomorrow at 11:30.  Please expect a phone call from Physical Therapy.

## 2014-02-21 NOTE — Telephone Encounter (Signed)
ROI faxed to [facility], Records received Right Back gave to Lauren 10.22.15/km

## 2014-02-22 ENCOUNTER — Institutional Professional Consult (permissible substitution) (INDEPENDENT_AMBULATORY_CARE_PROVIDER_SITE_OTHER): Payer: Medicare Other | Admitting: Surgery

## 2014-02-22 ENCOUNTER — Encounter: Payer: Self-pay | Admitting: Surgery

## 2014-02-22 VITALS — BP 102/59 | HR 111 | Resp 16 | Ht 71.0 in | Wt 277.0 lb

## 2014-02-22 DIAGNOSIS — I350 Nonrheumatic aortic (valve) stenosis: Secondary | ICD-10-CM

## 2014-02-22 NOTE — Progress Notes (Signed)
Patient ID: [name], male   DOB: March 20, 1946, 68 y.o.   MRN: [id number]    [facility]  MULTIDISCIPLINARY HEART VALVE CLINIC    CARDIOTHORACIC SURGERY CONSULTATION REPORT  Referring Provider is Tonny Bollman, MD PCP is Donzetta Sprung, MD  Chief Complaint  Patient presents with  . Aortic Stenosis    eval for TAVR    HPI:  The patient is a 68 year old gentleman with multiple medical problems and a history of coronary artery disease and severe symptomatic aortic stenosis. He underwent CABG x 5 by Dr. Cornelius Moras in 1998. 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 Repeat catheterization performed at that time demonstrated that the majority of his vein grafts remained patent although there was high-grade segmental stenosis in the midportion of the vein graft to right coronary artery. There had been some progression of native coronary artery disease and there was some disease within the terminal branches of the left circumflex system as well. The patient was treated with PCI and stenting of the vein graft to right coronary artery using a bare-metal stent. Since then the patient was followed by Dr Andee Lineman for several years and remained stable from a cardiac standpoint, although he developed progressive exertional shortness of breath. This was attributed to his underlying severe COPD, for which he is now dependent on oxygen therapy at night. More recently he has been followed by Dr. Purvis Sheffield who noted a systolic murmur on physical exam when he first examined the patient in 2014. Echocardiogram performed at that time demonstrated the presence of moderate aortic stenosis which has progressed in severity on subsequent followup exams. He also developed paroxysmal atrial flutter which has become persistent  more recently. He was recently placed on Xarelto for long term anticoagulation. More recently the patient's shortness of breath has continued to worsen and he was recently admitted with a rapid 20 lb weight gain and marked bilateral leg edema. He was noted to be severely anemic and transfused 1 unit packed red blood cells. He was started on lasix and has diuresed from 306 lbs to 277 lbs with much improvement in his shortness of breath.  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 He has severe chronic pain in his back and hips which limit his mobility considerably. He can only walk short distances using a cane. He uses oxygen at night to sleep. He has been diagnosed with cirrhosis and portal hypertension.   Past Medical History  Diagnosis Date  . Rheumatoid arthritis(714.0)     chronic Prednisone  . COPD (chronic obstructive pulmonary disease)   . Elevated liver function tests   . Hypertension   . Diabetes mellitus   . Aortic stenosis     a. 2D ECHO: 01/30/2014: ef 50-55%. No RWMAs. Severe WU:JWJXB area (VTI): 0.82 cm^2. Valve area (Vmax): 0.74 cm^2. Valve area (Vmean): 0.75 Cm^2. Mod LA/RA dilation. PA peak pressure 33  . CAD (coronary artery disease)   . GERD (gastroesophageal reflux disease)   . Chronic atrial flutter   . 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 Degenerative disc disease, cervical   . Degenerative disc disease, lumbar 2010  . Morbid obesity   . Coronary atherosclerosis of artery bypass graft 2010  . Acute on chronic diastolic heart failure 02/01/2014  . Chronic diastolic congestive heart failure 02/01/2014  . Epistaxis     Developed on Xarelto therapy  . Cirrhosis 02/01/2014  . Portal hypertension 02/01/2014  . Chronic  pancreatitis 02/01/2014    Noted on CT scan    Past Surgical History  Procedure Laterality Date  . Coronary stent placement  01/02/2009    s/p bare metal stent to the vein graft to the RCA 9/20. nml LV function   . Appendectomy    . Coronary artery bypass graft  03/27/1997    CABG x 5 by Dr Cornelius Moras  . Anterior cervical decompression      with posterior decompression and fusion   . Tonsillectomy and adenoidectomy    . Lumbar spine surgery    . Esophagogastroduodenoscopy (egd) with propofol N/A 02/01/2014    Procedure: ESOPHAGOGASTRODUODENOSCOPY (EGD) WITH PROPOFOL;  Surgeon: Shirley Friar, MD;  Location: [facility] ENDOSCOPY;  Service: Endoscopy;  Laterality: N/A;  . Colonoscopy with propofol N/A 02/01/2014    Procedure: COLONOSCOPY WITH PROPOFOL;  Surgeon: Shirley Friar, MD;  Location: [facility] ENDOSCOPY;  Service: Endoscopy;  Laterality: N/A;    Family History  Problem Relation Age of Onset  . Hypertension Mother   . Cancer Father   . Stroke Brother   . Liver cancer Paternal Grandfather   . Rheum arthritis Maternal Aunt   . Asthma Child   . Emphysema Brother     smoked    History   Social History  . Marital Status: Married    Spouse Name: N/A    Number of Children: N/A  . Years of Education: N/A   Occupational History  . Retired Special educational needs teacher    Social History Main Topics  . Smoking status: Former Smoker -- 3.00 packs/day for 35 years    Types: Cigarettes    Start date: 09/15/1961    Quit date: 11/23/1996  . Smokeless tobacco: Current User    Types: Chew     Comment: started chewing in 2001/chews 2-3 packs per week  . Alcohol Use: No  . Drug Use: No  . Sexual Activity: Not on file   Other Topics Concern  . Not on file   Social History Narrative   Married, retired.     Current Outpatient Prescriptions  Medication Sig Dispense Refill  . albuterol-ipratropium (COMBIVENT) 18-103 MCG/ACT inhaler Inhale 2 puffs into the lungs every 6 (six) hours as needed for wheezing  or shortness of breath.       Marland Kitchen amoxicillin (AMOXIL) 500 MG capsule Take 500 mg by mouth 3 (three) times daily.      Marland Kitchen ascorbic acid (VITAMIN C) 500 MG tablet Take 500 mg by mouth daily.       . budesonide (PULMICORT) 0.25 MG/2ML nebulizer solution Take 0.25 mg by nebulization 2 (two) times daily.      . cetirizine (ZYRTEC) 10 MG tablet Take 10 mg by mouth at bedtime.      Marland Kitchen co-enzyme Q-10 30 MG capsule Take 30 mg by mouth at bedtime.       Marland Kitchen desonide (DESOWEN) 0.05 % cream Apply 1 application topically 2 (two) times daily.      Marland Kitchen diltiazem (CARDIZEM) 30 MG tablet Take 1 tablet (30 mg total) by mouth 2 (two)  times daily.  180 tablet  3  . ezetimibe (ZETIA) 10 MG tablet Take 10 mg by mouth at bedtime.       . formoterol (PERFOROMIST) 20 MCG/2ML nebulizer solution Take 2 mLs (20 mcg total) by nebulization 2 (two) times daily. Use in nebulizer twice daily perfectly regularly  120 mL  11  . furosemide (LASIX) 40 MG tablet Take 2 tablets (80 mg total) by mouth daily. Take 2 tablets twice a day for 3 days, then 2 tablets daily. Or as to rectum.  120 tablet  3  . guaifenesin (ROBITUSSIN) 100 MG/5ML syrup Take 200 mg by mouth 3 (three) times daily as needed for cough.      . insulin NPH-insulin regular (NOVOLIN 70/30) (70-30) 100 UNIT/ML injection Inject 80 Units into the skin 2 (two) times daily.       Marland Kitchen ipratropium-albuterol (DUONEB) 0.5-2.5 (3) MG/3ML SOLN Take 3 mLs by nebulization 4 (four) times daily as needed (for shortness of breath).       Marland Kitchen ketoconazole (NIZORAL) 2 % cream Apply 1 application topically 2 (two) times daily as needed for irritation.       Marland Kitchen loratadine (CLARITIN) 10 MG tablet Take 10 mg by mouth every morning.       Marland Kitchen losartan (COZAAR) 100 MG tablet Take 50 mg by mouth at bedtime.       . metFORMIN (GLUCOPHAGE) 1000 MG tablet Take 1 tablet (1,000 mg total) by mouth 2 (two) times daily. Hold for 48 hours, restart on 02/10/2014.      . metoprolol succinate (TOPROL-XL) 25 MG 24 hr  tablet Take 1 tablet (25 mg total) by mouth 2 (two) times daily.  60 tablet  6  . Multiple Vitamin (MULTIVITAMIN) tablet Take 1 tablet by mouth daily.        Marland Kitchen oxyCODONE-acetaminophen (PERCOCET) 5-325 MG per tablet Take 1 tablet by mouth every 4 (four) hours as needed for moderate pain.       . potassium chloride SA (KLOR-CON M20) 20 MEQ tablet Take 1 tablet (20 mEq total) by mouth daily.  90 tablet  3  . predniSONE (DELTASONE) 5 MG tablet Take 5 mg by mouth daily. INCREASED TO 25MG  FOR 5 DAYS UNTIL SUNDAY10/25/15, THEN RESUME 5 MG      . aspirin 81 MG tablet Take 1 tablet (81 mg total) by mouth daily.       No current facility-administered medications for this visit.    No Known Allergies    Review of Systems:  General: normal appetite, normal energy, + weight gain, no weight loss, no fever  Cardiac: no chest pain with exertion, no chest pain at rest, + SOB with exertion, + resting SOB, no PND, + orthopnea, no palpitations, + arrhythmia, + atrial fibrillation, + LE edema, no dizzy spells, no syncope  Respiratory: + shortness of breath, + home oxygen, intermittent chronic productive cough, + dry cough, no bronchitis, + wheezing, no hemoptysis, no asthma, no pain with inspiration or cough, no sleep apnea, no CPAP at night  GI: no difficulty swallowing, no reflux, no frequent heartburn, no hiatal hernia, no abdominal pain, no constipation, no diarrhea, no hematochezia, no hematemesis, no melena  GU: no dysuria, no frequency, no urinary tract infection, no hematuria, no enlarged prostate, no kidney stones, no kidney disease  Vascular: no pain suggestive of claudication, no pain in feet, no leg cramps, no varicose veins, no DVT, no non-healing foot ulcer  Neuro: no stroke, no TIA's, no seizures, no  headaches, no temporary blindness one eye, no slurred speech, no peripheral neuropathy, + chronic pain, + instability of gait, no memory/cognitive dysfunction  Musculoskeletal: + severe arthritis, +  joint swelling, no myalgias, + difficulty walking, limited mobility  Skin: no rash, no itching, no skin infections, no pressure sores or ulcerations  Psych: no anxiety, no depression, no nervousness, no unusual recent stress  Eyes: no blurry vision, no floaters, no recent vision changes, + wears glasses or contacts  ENT: no hearing loss, + loose or painful teeth, no dentures, last saw dentist recently in [place] system  Hematologic: no easy bruising, + abnormal bleeding, no clotting disorder, + recent frequent epistaxis since starting Xarelto  Endocrine: + diabetes, does not check CBG's at home regularly        Physical Exam:   BP 102/59  Pulse 111  Resp 16  Ht  (1.803 m)  Wt 277 lb (125.646 kg)  BMI 38.65 kg/m2  SpO2 96%  General:  Morbidly obese and chronically ill-appearing  HEENT:  Unremarkable , NCAT PERLA, EOMI, poor dentition  Neck:   no JVD, no bruits, no adenopathy or thyromegaly  Chest:   clear to auscultation, symmetrical breath sounds, no wheezes, no rhonchi   CV:   RRR, grade III/VI crescendo/decrescendo murmur heard best at RUSB,                                                  no diastolic murmur  Abdomen:  soft, non-tender, no masses   Extremities:  warm, well-perfused, pulses not palpable, moderate bilateral LE edema  Rectal/GU  Deferred  Neuro:   Grossly non-focal and symmetrical throughout  Skin:   Clean and dry, no rashes, no breakdown   Diagnostic Tests:  Transthoracic Echocardiography  Patient: Dennis, [name] MR #: [id number] Study Date: 01/30/2014 Gender: M Age: 46 Height: 180.3 cm Weight: 137.4 kg BSA: 2.69 m^2 Pt. Status: Room: 3S15C  ADMITTING Cassell Clement ATTENDING Rollene Rotunda SONOGRAPHER Nolon Rod, RDCS ORDERING Verne Carrow REFERRING McAlhany, Christopher PERFORMING [facility], Inpatient  cc:  ------------------------------------------------------------------- LV EF: 50% -  55%  ------------------------------------------------------------------- Indications: Aortic stenosis 424.1.  ------------------------------------------------------------------- History: PMH: Congestive heart failure. Aortic valve disease. Chronic obstructive pulmonary disease. Risk factors: Former tobacco use. Hypertension.  ------------------------------------------------------------------- Study Conclusions  - Left ventricle: The cavity size was normal. Wall thickness was normal. Systolic function was normal. The estimated ejection fraction was in the range of 50% to 55%. Wall motion was normal; there were no regional wall motion abnormalities. - Aortic valve: There was severe stenosis. Valve area (VTI): 0.82 cm^2. Valve area (Vmax): 0.74 cm^2. Valve area (Vmean): 0.75 cm^2. - Left atrium: The atrium was moderately dilated. - Right atrium: The atrium was moderately dilated. - Pulmonary arteries: PA peak pressure: 33 mm Hg (S).  ------------------------------------------------------------------- Labs, prior tests, procedures, and surgery: Coronary artery bypass grafting.  Transthoracic echocardiography. M-mode, complete 2D, spectral Doppler, and color Doppler. Birthdate: Patient birthdate: [birth year]. Age: Patient is 68 yr old. Sex: Gender: male. BMI: 42.3 kg/m^2. Blood pressure: 104/59 Patient status: Inpatient. Study date: Study date: 01/30/2014. Study time: 03:09 PM. Location: ICU/CCU  -------------------------------------------------------------------  ------------------------------------------------------------------- Left ventricle: The cavity size was normal. Wall thickness was normal. Systolic function was normal. The estimated ejection fraction was in the range of 50% to 55%. Wall motion was normal; there were no regional wall motion  abnormalities.  ------------------------------------------------------------------- Aortic valve: Moderately thickened, moderately  calcified leaflets. Doppler: There was severe stenosis. There was no regurgitation. VTI ratio of LVOT to aortic valve: 0.24. Valve area (VTI): 0.82 cm^2. Indexed valve area (VTI): 0.31 cm^2/m^2. Valve area (Vmax): 0.74 cm^2. Indexed valve area (Vmax): 0.28 cm^2/m^2. Mean velocity ratio of LVOT to aortic valve: 0.22. Valve area (Vmean): 0.75 cm^2. Indexed valve area (Vmean): 0.28 cm^2/m^2. Mean gradient (S): 52 mm Hg. Peak gradient (S): 87 mm Hg.  ------------------------------------------------------------------- Aorta: Aortic root: The aortic root was normal in size. Ascending aorta: The ascending aorta was normal in size.  ------------------------------------------------------------------- Mitral valve: Structurally normal valve. Leaflet separation was normal. Doppler: Transvalvular velocity was within the normal range. There was no evidence for stenosis. There was trivial regurgitation. Peak gradient (D): 7 mm Hg.  ------------------------------------------------------------------- Left atrium: The atrium was moderately dilated.  ------------------------------------------------------------------- Right ventricle: The cavity size was normal. Systolic function was normal.  ------------------------------------------------------------------- Pulmonic valve: The valve appears to be grossly normal. Doppler: There was no significant regurgitation.  ------------------------------------------------------------------- Tricuspid valve: Structurally normal valve. Leaflet separation was normal. Doppler: Transvalvular velocity was within the normal range. There was trivial regurgitation.  ------------------------------------------------------------------- Pulmonary artery: Systolic pressure was within the normal range.  ------------------------------------------------------------------- Right atrium: The atrium was moderately  dilated.  ------------------------------------------------------------------- Pericardium: There was no pericardial effusion.  ------------------------------------------------------------------- Measurements  Left ventricle Value Reference LV ID, ED, PLAX chordal 50.9 mm 43 - 52 LV ID, ES, PLAX chordal (H) 41.1 mm 23 - 38 LV fx shortening, PLAX chordal (L) 19 % >=29 LV PW thickness, ED 10.3 mm --------- IVS/LV PW ratio, ED 1.16 <=1.3 Stroke volume, 2D 72 ml --------- Stroke volume/bsa, 2D 27 ml/m^2 --------- LV e&', lateral 10.9 cm/s --------- LV E/e&', lateral 12.02 --------- LV e&', medial 8.44 cm/s --------- LV E/e&', medial 15.52 --------- LV e&', average 9.67 cm/s --------- LV E/e&', average 13.55 ---------  Ventricular septum Value Reference IVS thickness, ED 11.9 mm ---------  LVOT Value Reference LVOT ID, S 21 mm --------- LVOT area 3.46 cm^2 --------- LVOT peak velocity, S 99.4 cm/s --------- LVOT mean velocity, S 72.8 cm/s --------- LVOT VTI, S 20.9 cm --------- LVOT peak gradient, S 4 mm Hg ---------  Aortic valve Value Reference Aortic valve mean velocity, S 338 cm/s --------- Aortic valve VTI, S 88 cm --------- Aortic mean gradient, S 52 mm Hg --------- Aortic peak gradient, S 87 mm Hg --------- VTI ratio, LVOT/AV 0.24 --------- Aortic valve area, VTI 0.82 cm^2 --------- Aortic valve area/bsa, VTI 0.31 cm^2/m^2 --------- Aortic valve area, peak velocity 0.74 cm^2 --------- Aortic valve area/bsa, peak 0.28 cm^2/m^2 --------- velocity Velocity ratio, mean, LVOT/AV 0.22 --------- Aortic valve area, mean velocity 0.75 cm^2 --------- Aortic valve area/bsa, mean 0.28 cm^2/m^2 --------- velocity  Aorta Value Reference Aortic root ID, ED 27 mm ---------  Left atrium Value Reference LA ID, A-P, ES 52 mm --------- LA ID/bsa, A-P 1.94 cm/m^2 <=2.2 LA volume, ES, 1-p A4C 76.2 ml --------- LA volume/bsa, ES, 1-p A4C 28.4 ml/m^2 --------- LA volume, ES, 1-p  A2C 83.7 ml --------- LA volume/bsa, ES, 1-p A2C 31.2 ml/m^2 ---------  Mitral valve Value Reference Mitral E-wave peak velocity 131 cm/s --------- Mitral A-wave peak velocity 73.8 cm/s --------- Mitral deceleration time (L) 144 ms 150 - 230 Mitral peak gradient, D 7 mm Hg --------- Mitral E/A ratio, peak 1.7 ---------  Pulmonary arteries Value Reference PA pressure, S, DP (H) 33 mm Hg <=30  Tricuspid valve Value Reference Tricuspid regurg peak velocity 248  cm/s --------- Tricuspid peak RV-RA gradient 25 mm Hg ---------  Systemic veins Value Reference Estimated CVP 8 mm Hg ---------  Right ventricle Value Reference RV pressure, S, DP (H) 33 mm Hg <=30  Legend: (L) and (H) mark values outside specified reference range.  ------------------------------------------------------------------- Prepared and Electronically Authenticated by  Kristeen Miss, M.D. 2015-09-30T20:17:35    Cardiac Catheterization Procedure Note  Name: TAYLOR LEVICK  MRN: [id number]  DOB: 29-Dec-1945  Procedure: Right Heart Cath, Left Heart Cath, Selective Coronary Angiography, SVG and LIMA angiography  Indication: 68 yo WM with history of CAD s/p CABG present with progressive dyspnea. He has severe Aortic stenosis by Echo. He was in Afib during procedure.  Procedural Details: The right groin was prepped, draped, and anesthetized with 1% lidocaine. Using the modified Seldinger technique a 5 French sheath was placed in the right femoral artery and a 7 French sheath was placed in the right femoral vein. A Swan-Ganz catheter was used for the right heart catheterization. Standard protocol was followed for recording of right heart pressures and sampling of oxygen saturations. Fick cardiac output was calculated. Standard Judkins catheters were used for selective coronary angiography and graft angiography. There were no immediate procedural complications. The patient was transferred to the post catheterization  recovery area for further monitoring.  Procedural Findings:  Hemodynamics  RA */14 mean 10 mm Hg  RV 34/10 mm hg  PA 33/9 mean 22 mm Hg  PCWP */21 mean 14 mm Hg  LV 146/13 mm Hg  AO 115/60 mean 83 mm Hg  AV mean gradient 21 mm Hg. AV area 2.3 cm squared. Index 0.97  Oxygen saturations:  PA 69%  AO 96%  Cardiac Output (Fick) 9.0 L/min  Cardiac Index (Fick) 3.7 L/min/meter squared.  Coronary angiography:  Coronary dominance: right  Left mainstem: Normal  Left anterior descending (LAD): Diffuse disease in the proximal vessel to 50-60%. Competitive flow from IMA graft.  Left circumflex (LCx): Patent in the AV groove. The first and second OMs are occluded.  Right coronary artery (RCA): 100% occlusion proximally.  LIMA to the LAD is widely patent.  SVG to the RCA is widely patent. There is a 30% stenosis in the mid graft. Following this there is a patent stent.  SVG sequential to OM 1 and OM 2 is patent there is 80% stenosis in the continuation segment of the SVG to OM 2.  SVG to the diagonal is patent. There is a focal 80% stenosis in the proximal SVG.  Left ventriculography: Not done  Contrast: 85 cc  Radiation dose/Fluoro time: 11 minutes  Final Conclusions:  1. Severe 3 vessel obstructive CAD  2. Patent LIMA to the LAD  3. Patent SVG to RCA  4. Patent SVG to diagonal with 80% proximal stenosis.  5. Patent SVG to the OM1 and OM2. 80% stenosis in continuation of graft to OM2.  6. AV mean gradient of 20 mm Hg. Data may be influenced by presence of Afib.  7. Normal right heart pressures.  Recommendations: Will discuss findings with primary team.  Judi Cong  02/05/2014, 3:34 PM       Provider Default, MD [facility] Feb 08, 2014 8:39:22 AM EDT       ADDENDUM REPORT: 02/08/2014 08:36  CLINICAL DATA: Aortic stenosis  EXAM:  Cardiac TAVR CT  TECHNIQUE:  The patient was scanned on a Philips 256 scanner. A 120 kV  retrospective scan was triggered in the descending thoracic  aorta at  111 HU's. Gantry  rotation speed was 270 msecs and collimation was .9  mm. 10 mg of iv Metoprolol and no nitro were given. The 3D data set  was reconstructed in 5% intervals of the R-R cycle. Systolic and  diastolic phases were analyzed on a dedicated work station using  MPR, MIP and VRT modes. The patient received 80 cc of contrast.  FINDINGS:  Aortic Valve: Severely thickened and calcified with moderately  restricted leaflet opening.  Aorta: Normal size, mild diffuse calcifications, no dissection.  Sinotubular Junction: 27 x 26 mm  Ascending Thoracic Aorta: 31 x 31 mm  Aortic Arch: 27 x 26 mm  Descending Thoracic Aorta: 28 x 27 mm  Sinus of Valsalva Measurements:  Non-coronary: 31 mm  Right -coronary: 30 mm  Left -coronary: 31 mm  Coronary Artery Height above Annulus:  Left Main: 10 mm  Right Coronary: 16 mm  Virtual Basal Annulus Measurements:  Maximum/Minimum Diameter: 28 x 25 mm  Perimeter: 99 mm  Area: 560 mm2  Coronary Arteries: The study was performed without use of NTG and is  suboptimal for coronary evaluation.  Optimum Fluoroscopic Angle for Delivery: RAO 1 CRA 1  IMPRESSION:  1. Severely thickened and calcified aortic valve with annular  measurements suitable for delivery of soft 29 mm Edward SAPIEN XT  valve.  2. Optimum Fluoroscopic Angle for Delivery: RAO 1 CRA 1  3. Sheath to tip distance sufficient for transaortic approach (76  mm).  [name]  Electronically Signed  By: [name]  On: 02/08/2014 08:36       Study Result    EXAM:  OVER-READ INTERPRETATION CT CHEST  The following report is an over-read performed by radiologist Dr.  Brock [facility] on 02/07/2014. This  over-read does not include interpretation of cardiac or coronary  anatomy or pathology. The coronary calcium score/coronary CTA  interpretation by the cardiologist is attached.  COMPARISON: 01/28/2014  FINDINGS:  Aortic and branch vessel  atherosclerosis. Small new mediastinal  lymph nodes are not pathologically enlarged by size criteria.  Suspected upper abdominal ventral hernia containing adipose tissue  and probably fluid, image 107 of series 201.  Severe centrilobular emphysema. Bilateral airway thickening. Mild  lingular and bilateral lower lobe atelectasis. Scarring medially at  the right lung apex. Probable mucus in the trachea, image 9 series  512.  Perihepatic ascites. Scattered lymph nodes in the gastrohepatic  ligament. Nodular contour of liver compatible with cirrhosis.  NONCARDIAC IMPRESSION:  1. Severe centrilobular emphysema.  2. Bilateral airway thickening favoring bronchitis.  3. Scattered lingular and bilateral lower lobe atelectasis.  4. Perihepatic ascites.  5. Cirrhosis with scattered lymph nodes in the gastrohepatic  ligament which are probably reactive.  6. Aortic and branch vessel atherosclerosis.  7. Upper abdominal ventral hernia containing adipose tissue and  probably a small amount of fluid.  Electronically Signed:  By: Herbie Baltimore M.D.  On: 02/07/2014 17:08    CLINICAL DATA: 68 year old male patient with severe aortic  stenosis. Preprocedural study prior to potential transcatheter  aortic valve replacement (TAVR).  EXAM:  CTA CHEST ABDOMEN AND PELVIS WITH CONTRAST  TECHNIQUE:  Multidetector CT imaging of the chest, abdomen and pelvis was  performed using the standard protocol during bolus administration of  intravenous contrast. Multiplanar reconstructed images and MIPs were  obtained and reviewed to evaluate the vascular anatomy.  CONTRAST: OMNIPAQUE IOHEXOL 350 MG/ML SOLN  COMPARISON: Chest CT 01/28/2014. CT of the abdomen and pelvis  without contrast 10/17/2012.  FINDINGS:  CT CHEST FINDINGS  Mediastinum: Heart size is borderline enlarged. There is no  significant pericardial fluid, thickening or pericardial  calcification. Severely calcified and thickened aortic  valve. There  is atherosclerosis of the thoracic aorta, the great vessels of the  mediastinum and the coronary arteries, including calcified  atherosclerotic plaque in the left main, left anterior descending,  left circumflex and right coronary arteries. Status post median  sternotomy for CABG, including LIMA to the LAD. No pathologically  enlarged mediastinal or hilar lymph nodes. Esophagus is unremarkable  in appearance. Multiple small paraesophageal varices.  Lungs/Pleura: Areas of dependent bibasilar subsegmental atelectasis  are noted. Mild chronic scarring in the right apex is unchanged.  Mild diffuse bronchial wall thickening. There is a background of  mild centrilobular and paraseptal emphysema, most notable in the  lung apices. No acute consolidative airspace disease. No pleural  effusions. No suspicious appearing pulmonary nodules or masses.  Musculoskeletal: Median sternotomy wires. Orthopedic fixation  hardware in the lower cervical spine incompletely visualized. There  are no aggressive appearing lytic or blastic lesions noted in the  visualized portions of the skeleton.  CT ABDOMEN AND PELVIS FINDINGS  Hepatobiliary: Liver has a shrunken appearance and nodular contour,  compatible with underlying cirrhosis. No definite focal hepatic  lesions are identified. No intra or extrahepatic biliary ductal  dilatation. Tiny calcified gallstones layer dependently in the  gallbladder measuring 3-4 mm in size. No definite signs of acute  cholecystitis at this time.  Pancreas: Extensive pancreatic calcifications throughout the head  and proximal body of the pancreas, presumably sequela of chronic  pancreatitis. No focal pancreatic lesions. No pancreatic ductal  dilatation.  Spleen: The spleen is enlarged measuring 6.4 x 15.0 x 13.4 cm  (estimated splenic volume of 643 mL).  Adrenals/Urinary Tract: Adrenal glands are normal in appearance.  Multiple low-attenuation lesions are noted in  the kidneys  bilaterally, the majority of which are too small to definitively  characterize. The larger of these lesions are all compatible with  simple cysts, with the largest simple cyst measuring 2.6 cm in  diameter in the medial aspect of the interpolar region of the left  kidney. No hydroureteronephrosis. Urinary bladder is unremarkable in  appearance.  Stomach/Bowel: Normal appearance of the stomach. No pathologic  dilatation of the small bowel or colon. A few scattered colonic  diverticulae are noted, without surrounding inflammatory changes to  suggest an acute diverticulitis at this time.  Vascular/Lymphatic: Extensive atherosclerosis throughout the  abdominal and pelvic vasculature, with findings and measurements  pertinent to potential TAVR procedure, as discussed below. In  addition, there are prominent atherosclerotic plaques at the ostia  of both the celiac axis and the superior mesenteric artery, and  there appears likely to be a potentially hemodynamically significant  stenosis in excess of 50% at the ostium of the superior mesenteric  artery. Inferior mesenteric artery is patent.  Reproductive: Prostate gland is unremarkable in appearance.  Other: Trace volume of ascites, predominantly perihepatic. Tiny  ventral hernia and epigastric region containing predominantly  omental fat and a small volume of ascites. No pneumoperitoneum.  Musculoskeletal: There are no aggressive appearing lytic or blastic  lesions noted in the visualized portions of the skeleton. Interbody  graft at the L3-L4 interspace.  VASCULAR MEASUREMENTS PERTINENT TO TAVR:  AORTA:  Minimal Aortic Diameter - 14 x 12 mm  Severity of Aortic Calcification - moderate to severe  RIGHT PELVIS:  Right Common Iliac Artery -  Minimal Diameter - 7.6 x  5.9 mm  Tortuosity - Mild  Calcification - Severe  Right External Iliac Artery -  Minimal Diameter - 7.8 x 4.2 mm  Tortuosity - Mild  Calcification - Moderate   Right Common Femoral Artery -  Minimal Diameter - 8.7 x 4.9 mm  Tortuosity - Mild  Calcification - Severe  LEFT PELVIS:  Left Common Iliac Artery -  Minimal Diameter - 6.9 x 6.2 mm  Tortuosity - Mild  Calcification - Severe  Left External Iliac Artery -  Minimal Diameter - 6.6 x 4.8 mm  Tortuosity - Mild  Calcification - Moderate  Left Common Femoral Artery -  Minimal Diameter - 5.2 x 4.5 mm  Tortuosity - Mild  Calcification - Severe  Review of the MIP images confirms the above findings.  IMPRESSION:  1. Findings and measurements pertinent to potential TAVR procedure,  as detailed above. Although the patient does have extensive pelvic  arterial calcification, and does not have suitable access from the  left side, there appears to be potentially suitable access on the  right side.  2. Mild diffuse bronchial wall thickening with mild centrilobular  and paraseptal emphysema; imaging findings suggestive of underlying  COPD.  3. Stigmata of cirrhosis with portal hypertension and associated  splenomegaly, as above.  4. Cholelithiasis without evidence to suggest acute cholecystitis at  this time.  5. Mild colonic diverticulosis without findings to suggest acute  diverticulitis at this time.  6. 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  Electronically Signed  By: Trudie Reed M.D.  On: 02/11/2014 09:06      Ref Range 65mo ago    FVC-Pre L 2.30 P    FVC-%Pred-Pre % 49 P    FVC-Post L 2.91 P    FVC-%Pred-Post % 62 P    FVC-%Change-Post % 26 P    FEV1-Pre L 1.20 P    FEV1-%Pred-Pre % 34 P    FEV1-Post L 1.51 P    FEV1-%Pred-Post % 44 P    FEV1-%Change-Post % 25 P    FEV6-Pre L 2.12 P    FEV6-%Pred-Pre % 48 P    FEV6-Post L 2.72 P    FEV6-%Pred-Post % 61 P    FEV6-%Change-Post % 28 P    Pre  FEV1/FVC ratio % 52 P    FEV1FVC-%Pred-Pre % 70 P    Post FEV1/FVC ratio % 52 P    FEV1FVC-%Change-Post % 0 P    Pre FEV6/FVC Ratio % 93 P    FEV6FVC-%Pred-Pre % 98 P    Post FEV6/FVC ratio % 94 P    FEV6FVC-%Pred-Post % 99 P    FEV6FVC-%Change-Post % 0 P    FEF 25-75 Pre L/sec 0.42 P    FEF2575-%Pred-Pre % 15 P    FEF 25-75 Post L/sec 0.94 P    FEF2575-%Pred-Post % 35 P    FEF2575-%Change-Post % 126 P    RV L 3.79 P    RV % pred % 152 P    TLC L 6.53 P    TLC % pred % 90 P    DLCO unc ml/min/mmHg 20.05 P    DLCO unc % pred % 59 P    DL/VA ml/min/mmHg/L 4.58 P    DL/VA % pred % 74 P    Resulting Agency [facility]     Specimen  Collected: 04/30/13 9:30 AM Last Resulted: 04/30/13 10:27 AM           P=Value has a preliminary status     STS Risk Calculator   Procedure Redo CABG + AVR  Risk of Mortality 14.4%  Morbidity or Mortality 52.7%  Prolonged LOS 34.6%  Short LOS 8.4%  Permanent Stroke 2.5%  Prolonged Vent Support 47.7%  DSW Infection 2.6%  Renal Failure 20.5%  Reoperation 17.6%     Impression:  The patient has severe symptomatic aortic stenosis with preserved LV function and NYHA class III CHF symptoms. He has recently had an acute exacerbation of CHF with 20 lbs of edema over a few weeks resulting in need for hospitalization. He has improved markedly with diuresis. He has multiple severe medical problems including morbid obesity, severe COPD, rheumatoid arthritis, cirrhosis with portal hypertension, anemia of undermined origin, persistent atrial flutter, and prior CABG. I agree with Dr. Cornelius Moras that the risk of conventional AVR would be extremely high as calculated with the STS Risk Calculator. I don' think he is a candidate for redo sternotomy for AVR and possible CABG. I do think TAVR is a good alternative for him. His gated cardiac CT shows that he would be an acceptable candidate for a Sapien XT valve. He has moderately tortuous and severely calcified pelvic arterial  vessels. The lumen diameter on the left is inadequate and the right side is probably also too small given the degree of calcification. We may have to consider the trans-apical route or wait for the S3 valve with a smaller delivery system. I will discuss this with Dr. Cornelius Moras and Dr. Excell Seltzer. I reviewed the CT scans with the patient and his wife and discussed the complexities of the access route and the options. I told them that we will discuss it further and call him.   Plan:  We will discuss the case further with the Heart Valve Team and decide on the best access route for TAVR in this patient.    Alleen Borne, MD 02/22/2014

## 2014-02-26 ENCOUNTER — Other Ambulatory Visit: Payer: Self-pay | Admitting: *Deleted

## 2014-02-26 DIAGNOSIS — I350 Nonrheumatic aortic (valve) stenosis: Secondary | ICD-10-CM

## 2014-02-27 ENCOUNTER — Other Ambulatory Visit: Payer: Self-pay | Admitting: *Deleted

## 2014-02-27 DIAGNOSIS — I350 Nonrheumatic aortic (valve) stenosis: Secondary | ICD-10-CM

## 2014-02-28 ENCOUNTER — Ambulatory Visit: Payer: Medicare Other | Admitting: Thoracic Surgery (Cardiothoracic Vascular Surgery)

## 2014-02-28 ENCOUNTER — Encounter (HOSPITAL_COMMUNITY): Payer: Self-pay

## 2014-02-28 ENCOUNTER — Encounter (HOSPITAL_COMMUNITY)
Admission: RE | Admit: 2014-02-28 | Discharge: 2014-02-28 | Disposition: A | Discharge status: Home / Self Care | Payer: Medicare Other | Source: Ambulatory Visit | Attending: Thoracic Surgery (Cardiothoracic Vascular Surgery) | Admitting: Thoracic Surgery (Cardiothoracic Vascular Surgery)

## 2014-02-28 ENCOUNTER — Encounter (HOSPITAL_COMMUNITY): Payer: Self-pay | Admitting: Dentistry

## 2014-02-28 ENCOUNTER — Other Ambulatory Visit: Payer: Self-pay | Admitting: *Deleted

## 2014-02-28 ENCOUNTER — Ambulatory Visit: Payer: Medicare Other | Attending: Surgery | Admitting: Physical Therapy

## 2014-02-28 ENCOUNTER — Ambulatory Visit (HOSPITAL_COMMUNITY): Payer: Self-pay | Admitting: Dentistry

## 2014-02-28 VITALS — BP 105/53 | HR 98 | Temp 98.1°F

## 2014-02-28 DIAGNOSIS — M6281 Muscle weakness (generalized): Secondary | ICD-10-CM | POA: Diagnosis not present

## 2014-02-28 DIAGNOSIS — K047 Periapical abscess without sinus: Secondary | ICD-10-CM | POA: Diagnosis not present

## 2014-02-28 DIAGNOSIS — Z5189 Encounter for other specified aftercare: Secondary | ICD-10-CM | POA: Insufficient documentation

## 2014-02-28 DIAGNOSIS — I10 Essential (primary) hypertension: Secondary | ICD-10-CM | POA: Insufficient documentation

## 2014-02-28 DIAGNOSIS — Z01818 Encounter for other preprocedural examination: Secondary | ICD-10-CM | POA: Diagnosis not present

## 2014-02-28 DIAGNOSIS — K083 Retained dental root: Secondary | ICD-10-CM

## 2014-02-28 DIAGNOSIS — K030 Excessive attrition of teeth: Secondary | ICD-10-CM

## 2014-02-28 DIAGNOSIS — K036 Deposits [accretions] on teeth: Secondary | ICD-10-CM

## 2014-02-28 DIAGNOSIS — M270 Developmental disorders of jaws: Secondary | ICD-10-CM

## 2014-02-28 DIAGNOSIS — I350 Nonrheumatic aortic (valve) stenosis: Secondary | ICD-10-CM

## 2014-02-28 DIAGNOSIS — K045 Chronic apical periodontitis: Secondary | ICD-10-CM

## 2014-02-28 DIAGNOSIS — K029 Dental caries, unspecified: Secondary | ICD-10-CM

## 2014-02-28 DIAGNOSIS — M264 Malocclusion, unspecified: Secondary | ICD-10-CM

## 2014-02-28 DIAGNOSIS — K0530 Chronic periodontitis, unspecified: Secondary | ICD-10-CM

## 2014-02-28 DIAGNOSIS — Z0189 Encounter for other specified special examinations: Secondary | ICD-10-CM

## 2014-02-28 DIAGNOSIS — IMO0002 Reserved for concepts with insufficient information to code with codable children: Secondary | ICD-10-CM

## 2014-02-28 HISTORY — DX: Shortness of breath: R06.02

## 2014-02-28 NOTE — Progress Notes (Signed)
Dr Cornelius Moras notified re. abcessed tooth. Surgery post-poned

## 2014-02-28 NOTE — Patient Instructions (Signed)
HEART VALVES AND MOUTH CARE:  FACTS:   If you have any infection in your mouth, it can infect your heart valve.  If you heart valve is infected, you will be seriously ill.  Infections in the mouth can be SILENT and do not always cause pain.  Examples of infections in the mouth are gum disease, dental cavities, and abscesses.  Some possible signs of infection are: Bad breath, bleeding gums, or teeth that are sensitive to sweets, hot, and/or cold. There are many other signs as well.  WHAT YOU HAVE TO DO:   Brush your teeth after meals and at bedtime. Spend at least 2 minutes brushing well, especially behind your back teeth and all around your teeth that stand alone. Brush at the gumline also.  Do not go to bed without brushing your teeth and flossing.  If you gums bleed when you brush or floss, do NOT stop brushing or flossing. It usually means that your gums need more attention and better cleaning.   If your Dentist or Dr. Kristin Bruins gave you a prescription mouthwash to use, make sure to use it as directed. If you run out of the medication, get a refill at the pharmacy.   If you were given any other medications or directions by your Dentist, please follow them. If you did not understand the directions or forget what you were told, please call. We will be happy to refresh her memory.  If you need antibiotics before dental procedures, make sure you take them one hour prior to every dental visit as directed.   Get a dental checkup every 4-6 months in order to keep your mouth healthy, or to find and treat any new infection. You will most likely need your teeth cleaned or gums treated at the same time.  If you are not able to come in for your scheduled appointment, call your Dentist as soon as possible to reschedule.  If you have a problem in between dental visits, call your Dentist.

## 2014-02-28 NOTE — Progress Notes (Signed)
DENTAL CONSULTATION  Date of Consultation:  02/28/2014 Patient Name:   [name] Date of Birth:   09-03-1945 Medical Record Number: [id number]  VITALS: BP 105/53  Pulse 98  Temp(Src) 98.1 F (36.7 C) (Oral)  CHIEF COMPLAINT: The patient was referred by Dr. Cornelius Moras for a medically necessary pre-heart valve surgery dental protocol examination.  HPI: [name] is a 68 year old male recently diagnosed with severe aortic stenosis. Patient with anticipated aortic valve replacement heart surgery. Patient is now seen as part of a medically necessary pre-heart valve surgery dental protocol examination.  Patient currently denies having acute toothache symptoms today. Patient has had previous toothache symptoms involving the upper right canine #6. The patient described the pain as reaching an 8 out of 10 in intensity but is 0 out of 10 in intensity today.  Patient has a history of having seen his primary dentist, Dr. Saul Fordyce,  for dental restoration on tooth #6 approximately 3 weeks ago. The patient subsequently developed swelling and pain in that area. Patient was placed on amoxicillin 500 mg 3 times daily for 10 days. The symptoms appeared to have resolved until approximately 02/26/2014. Patient then unable to reach his primary dentist, but his primary physician placed him on Keflex 500mg  taking 2 tablets twice daily for the next 10 days. The patient then presented for presurgical testing today prior to the anticipated aortic valve replacement. When his dental history was obtained, the patient was evaluated by Dr. and he became aware of the history of abscess formation. Dr. Cornelius Moras then referred the patient to Dental Medicine for a pre-heart valve surgery dental protocol examination.   The patient indicates that his last dental cleaning was with Dr. Cornelius Moras in [place], [place] approximately 6 months ago. Patient usually sees the dentist only as needed. Patient denies having  any partial dentures. The patient was in the midst of a comprehensive dental treatment plan per the patient report. Patient gives a history of this being made in conjunction with service-connected [facility] benefits.  PROBLEM LIST: Patient Active Problem List   Diagnosis Date Noted  . Aortic stenosis 03/16/2013    Priority: High  . Acute on chronic diastolic heart failure 02/01/2014  . Chronic diastolic congestive heart failure 02/01/2014  . Iron deficiency anemia 02/01/2014  . Cirrhosis 02/01/2014  . Portal hypertension 02/01/2014  . Chronic pancreatitis 02/01/2014  . Degenerative disc disease, lumbar   . Epistaxis   . Chronic blood loss anemia 01/31/2014  . Morbid obesity   . CHF (congestive heart failure) 01/29/2014  . COPD GOLD III with reversibility  04/30/2013  . Dyspnea 03/23/2013  . Rheumatoid arthritis 03/23/2013  . HTN (hypertension) 03/16/2013  . Hyperlipidemia 03/16/2013  . LIVER FUNCTION TESTS, ABNORMAL, HX OF 02/25/2010  . Coronary atherosclerosis of artery bypass graft 01/15/2009  . Atrial flutter 01/15/2009  . Sciatica 01/15/2009  . 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 Rheumatoid arthritis(714.0)     chronic Prednisone  . COPD (chronic obstructive pulmonary disease)   . Elevated liver function tests   . Hypertension   . Diabetes mellitus   . Aortic stenosis     a. 2D ECHO: 01/30/2014: ef 50-55%. No RWMAs. Severe 02/01/2014 area (VTI): 0.82 cm^2. Valve area (Vmax): 0.74 cm^2. Valve area (Vmean): 0.75 Cm^2. Mod LA/RA dilation. PA peak pressure 33  . CAD (coronary artery disease)   . GERD (gastroesophageal reflux disease)   . Chronic  atrial flutter   . 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 Degenerative disc disease, cervical   . Degenerative disc disease, lumbar 2010  . Morbid obesity   . Coronary atherosclerosis of artery bypass  graft 2010  . Acute on chronic diastolic heart failure 02/01/2014  . Chronic diastolic congestive heart failure 02/01/2014  . Epistaxis     Developed on Xarelto therapy  . Cirrhosis 02/01/2014  . Portal hypertension 02/01/2014  . Chronic pancreatitis 02/01/2014    Noted on CT scan  . Shortness of breath     PSH: Past Surgical History  Procedure Laterality Date  . Coronary stent placement  01/02/2009    s/p bare metal stent to the vein graft to the RCA 9/20. nml LV function   . Appendectomy    . Coronary artery bypass graft  03/27/1997    CABG x 5 by Dr Cornelius Moras  . Anterior cervical decompression      with posterior decompression and fusion   . Tonsillectomy and adenoidectomy    . Lumbar spine surgery    . Esophagogastroduodenoscopy (egd) with propofol N/A 02/01/2014    Procedure: ESOPHAGOGASTRODUODENOSCOPY (EGD) WITH PROPOFOL;  Surgeon: Shirley Friar, MD;  Location: [facility] ENDOSCOPY;  Service: Endoscopy;  Laterality: N/A;  . Colonoscopy with propofol N/A 02/01/2014    Procedure: COLONOSCOPY WITH PROPOFOL;  Surgeon: Shirley Friar, MD;  Location: [facility] ENDOSCOPY;  Service: Endoscopy;  Laterality: N/A;    ALLERGIES: No Known Allergies  MEDICATIONS: Current Outpatient Prescriptions  Medication Sig Dispense Refill  . albuterol-ipratropium (COMBIVENT) 18-103 MCG/ACT inhaler Inhale 2 puffs into the lungs every 6 (six) hours as needed for wheezing or shortness of breath.       Marland Kitchen ascorbic acid (VITAMIN C) 500 MG tablet Take 500 mg by mouth daily.       Marland Kitchen aspirin 81 MG tablet Take 1 tablet (81 mg total) by mouth daily.      . budesonide (PULMICORT) 0.25 MG/2ML nebulizer solution Take 0.25 mg by nebulization 2 (two) times daily.      . cephALEXin (KEFLEX) 500 MG capsule Take 1,000 mg by mouth 2 (two) times daily. for 10 days      . cetirizine (ZYRTEC) 10 MG tablet Take 10 mg by mouth at bedtime.      Marland Kitchen co-enzyme Q-10 30 MG capsule Take 30 mg by mouth at bedtime.       Marland Kitchen desonide (DESOWEN)  0.05 % cream Apply 1 application topically 2 (two) times daily as needed (for rash).       Marland Kitchen diltiazem (CARDIZEM) 30 MG tablet Take 1 tablet (30 mg total) by mouth 2 (two) times daily.  180 tablet  3  . ezetimibe (ZETIA) 10 MG tablet Take 10 mg by mouth at bedtime.       . formoterol (PERFOROMIST) 20 MCG/2ML nebulizer solution Take 2 mLs (20 mcg total) by nebulization 2 (two) times daily. Use in nebulizer twice daily perfectly regularly  120 mL  11  . furosemide (LASIX) 80 MG tablet Take 80 mg by mouth daily.      . insulin NPH-insulin regular (NOVOLIN 70/30) (70-30) 100 UNIT/ML injection Inject 80 Units into the skin 2 (two) times daily.       Marland Kitchen ipratropium-albuterol (DUONEB) 0.5-2.5 (3) MG/3ML SOLN Take 3 mLs by nebulization every 6 (six) hours as needed (for shortness  of breath).      Marland Kitchen ketoconazole (NIZORAL) 2 % cream Apply 1 application topically 2 (two) times daily as needed for irritation.       Marland Kitchen loratadine (CLARITIN) 10 MG tablet Take 10 mg by mouth every morning.       . metFORMIN (GLUCOPHAGE) 1000 MG tablet Take 1 tablet (1,000 mg total) by mouth 2 (two) times daily. Hold for 48 hours, restart on 02/10/2014.      . metoprolol succinate (TOPROL-XL) 25 MG 24 hr tablet Take 1 tablet (25 mg total) by mouth 2 (two) times daily.  60 tablet  6  . Multiple Vitamin (MULTIVITAMIN) tablet Take 1 tablet by mouth daily.        Marland Kitchen oxyCODONE-acetaminophen (PERCOCET) 5-325 MG per tablet Take 1 tablet by mouth every 4 (four) hours as needed for moderate pain.       . potassium chloride SA (KLOR-CON M20) 20 MEQ tablet Take 1 tablet (20 mEq total) by mouth daily.  90 tablet  3  . predniSONE (DELTASONE) 5 MG tablet Take 5 mg by mouth daily with breakfast.       No current facility-administered medications for this visit.    LABS: Lab Results  Component Value Date   WBC 6.2 02/07/2014   HGB 9.1* 02/07/2014   HCT 28.7* 02/07/2014   MCV 90.0 02/07/2014   PLT 137* 02/07/2014      Component Value Date/Time    NA 135* 02/07/2014 1300   K 4.3 02/07/2014 1300   CL 97 02/07/2014 1300   CO2 29 02/07/2014 1300   GLUCOSE 186* 02/07/2014 1300   BUN 11 02/07/2014 1300   CREATININE 0.75 02/07/2014 1300   CALCIUM 8.9 02/07/2014 1300   GFRNONAA >90 02/07/2014 1300   GFRAA >90 02/07/2014 1300   Lab Results  Component Value Date   INR 1.19 02/01/2014   INR 0.98 07/25/2009   INR 1.1 12/25/2008   No results found for this basename: PTT    SOCIAL HISTORY: History   Social History  . Marital Status: Married    Spouse Name: N/A    Number of Children: N/A  . Years of Education: N/A   Occupational History  . Retired Special educational needs teacher    Social History Main Topics  . Smoking status: Former Smoker -- 3.00 packs/day for 35 years    Types: Cigarettes    Start date: 09/15/1961    Quit date: 11/23/1996  . Smokeless tobacco: Current User    Types: Chew     Comment: started chewing in 2001/chews 2-3 packs per week  . Alcohol Use: No  . Drug Use: No  . Sexual Activity: Not on file   Other Topics Concern  . Not on file   Social History Narrative   Married, retired.     FAMILY HISTORY: Family History  Problem Relation Age of Onset  . Hypertension Mother   . Cancer Father   . Stroke Brother   . Liver cancer Paternal Grandfather   . Rheum arthritis Maternal Aunt   . Asthma Child   . Emphysema Brother     smoked    REVIEW OF SYSTEMS: Reviewed with the patient and included in dental record.  DENTAL HISTORY: CHIEF COMPLAINT: The patient was referred by Dr. Cornelius Moras for a medically necessary pre-heart valve surgery dental protocol examination.  HPI: [name] is a 68 year old male recently diagnosed with severe aortic stenosis. Patient with anticipated aortic valve replacement heart surgery. Patient is now seen as  part of a medically necessary pre-heart valve surgery dental protocol examination.  Patient currently denies having acute toothache symptoms today. Patient has had previous toothache  symptoms involving the upper right canine #6. The patient described the pain as reaching an 8 out of 10 in intensity but is 0 out of 10 in intensity today.  Patient has a history of having seen his primary dentist, Dr. Saul Fordyce,  for dental restoration on tooth #6 approximately 3 weeks ago. The patient subsequently developed swelling and pain in that area. Patient was placed on amoxicillin 500 mg 3 times daily for 10 days. The symptoms appeared to have resolved until approximately 02/26/2014. Patient then unable to reach his primary dentist, but his primary physician placed him on Keflex 500mg  taking 2 tablets twice daily for the next 10 days. The patient then presented for presurgical testing today prior to the anticipated aortic valve replacement. When his dental history was obtained, the patient was evaluated by Dr. Cornelius Moras and he became aware of the history of abscess formation. Dr. Cornelius Moras then referred the patient to Dental Medicine for a pre-heart valve surgery dental protocol examination.   The patient indicates that his last dental cleaning was with Dr. Saul Fordyce in [place], [place] approximately 6 months ago. Patient usually sees the dentist only as needed. Patient denies having any partial dentures. The patient was in the midst of a comprehensive dental treatment plan per the patient report. Patient gives a history of this being made in conjunction with service-connected [facility] dental benefits.  DENTAL EXAMINATION: GENERAL: The patient is a well-developed, well-nourished male in no acute distress. Patient walked into the clinic with the aid of a wheelchair and cane. HEAD AND NECK: There is no palpable sub mandibular lymphadenopathy. The patient denies acute TMJ symptoms. INTRAORAL EXAM: Patient has normal saliva. Patient has a small mandibular left lingual torus. There is no evidence of abscess formation within the mouth today. There is atrophy of the edentulous alveolar ridges. DENTITION:  Patient is missing tooth numbers 1, 3, 4, 5, 12, 13, 15, 16, 19, 30. There are retained roots in the area of tooth numbers 2, 11, 14, 18, 20. PERIODONTAL: Patient has chronic periodontitis with plaque and calculus accumulations, generalized gingival recession, and tooth mobility as charted. DENTAL CARIES/SUBOPTIMAL RESTORATIONS: There are multiple dental caries and suboptimal dental restorations as per dental charting. ENDODONTIC: The patient has a history of acute pulpitis symptoms of tooth #6. There is evidence of periapical pathology associated with tooth #6, 18, 20, 22, and 29. Tooth #22 and 23 have had previous root canal therapy. The patient denies having any acute symptoms associated with tooth numbers 22 and 23, but does appear to have some persistent periapical radiolucency associated with the apex of tooth #22. The restorations of tooth numbers 22 and 23 are suboptimal and retreatment of root canal therapies numbers 22 and 23 may be indicated along with fabrication of new post and cores and definitive Crown restorations. CROWN AND BRIDGE: There is a bridge on tooth numbers 29 through 31. Both the abutment teeth have extensive dental caries that most likely will require dental extraction.  The patient could consider  root canal therapy with crown lengthening surgery prior to fabrication of a new bridge. The crown on tooth #32 appears to be acceptable. PROSTHODONTIC: The patient denies having any partial dentures. OCCLUSION: Patient has a poor occlusal scheme secondary to multiple missing teeth, supra eruption and drifting of the unopposed teeth into the edentulous areas, extensive maxillary  and mandibular incisal attrition, and lack of replacement of all missing teeth with dental prostheses.  RADIOGRAPHIC INTERPRETATION: An Orthopantogram was obtained and supplemented with a full series of dental radiographs. There are multiple missing teeth. There are multiple retained root segments. There  multiple areas of periapical pathology and radiolucency. Multiple dental caries are noted. Multiple suboptimal dental restorations are noted. There is extensive incisal attrition. There is moderate bone loss. There are previous root canal therapy associated with tooth numbers 22 and 23. There appears to be persistent periapical radiolucency associated with the apex of tooth #22. A poor occlusal scheme is noted.  ASSESSMENTS: 1. Severe aortic stenosis 2. Pre-aortic valve replacement dental protocol examination 3. Chronic apical periodontitis 4. Multiple retained root segments 5. Extensive dental caries 6. Chronic periodontitis with bone loss 7. Accretions 8. Gingival recession 9. Tooth mobility 10. Mandibular left lingual torus-small 11. Maxillary and mandibular incisal attrition 12. Multiple suboptimal dental restorations 13. Supra eruption and drifting of the unopposed teeth into the edentulous areas 14. Poor occlusal scheme and malocclusion 15. No history of partial dentures 16. Potential complications up to and including death to the overall cardiovascular compromise with anticipated invasive dental procedures in the operating room with general anesthesia   PLAN/RECOMMENDATIONS: 1. I discussed the risks, benefits, and complications of various treatment options with the patient in relationship to his medical and dental conditions, anticipated aortic valve replacement, and future risks for endocarditis or valvular infection. We discussed various treatment options to include no treatment, total and subtotal extractions with alveoloplasty, pre-prosthetic surgery as indicated, periodontal therapy, dental restorations, root canal therapy, crown and bridge therapy, implant therapy, and replacement of missing teeth as indicated. The patient currently wishes to proceed with extraction of tooth numbers 2, 6, 7, 8, 9, 10, 11, 14, 18, 20, 29, and 31 with alveoloplasty and periodontal therapy as  indicated in the operating room with general anesthesia. This will be scheduled after the patient contacts his insurance carriers to determine his overall dental benefits for these procedures. After dental surgery, the patient will be cleared for heart valve surgery as indicated. Once medically stable from the anticipated heart valve surgery, the patient will then follow-up the dentist of his choice for continued periodontal therapy, evaluation of tooth numbers 22 and 23 for retreatment of the endodontic therapy, definitive treatment of tooth numbers 22 and 23 with post and core restorations and crowns as indicated, additional dental restorations as needed, and evaluation for replacement of missing teeth as indicated. Operating room procedure will be scheduled once the patient contacts dental medicine to allow arrangement of this operating room procedure.   2. Discussion of findings with medical team and coordination of future medical and dental care as needed.  I spent in excess of  120 minutes during the conduct of this consultation and >50% of this time involved direct face-to-face encounter for counseling and/or coordination of the patient's care.    Charlynne Pander, DDS

## 2014-03-05 ENCOUNTER — Encounter (HOSPITAL_COMMUNITY): Admission: RE | Payer: Self-pay | Source: Ambulatory Visit

## 2014-03-05 ENCOUNTER — Inpatient Hospital Stay (HOSPITAL_COMMUNITY)
Admission: RE | Admit: 2014-03-05 | Payer: Medicare Other | Source: Ambulatory Visit | Admitting: Thoracic Surgery (Cardiothoracic Vascular Surgery)

## 2014-03-05 SURGERY — REPLACEMENT, AORTIC VALVE, TRANSAPICAL APPROACH
Anesthesia: General | Site: Chest

## 2014-03-07 ENCOUNTER — Other Ambulatory Visit (HOSPITAL_COMMUNITY): Payer: Self-pay | Admitting: Dentistry

## 2014-03-08 NOTE — Progress Notes (Signed)
Anesthesia Chart Review: Patient is a 68 year old male scheduled for multiple teeth extractions with alveoloplasty and gross debridement on 03/12/14 by Dr. Kristin Bruins.  Case is posted for GA.  He is scheduled to be a same day work-up as he was recently at [facility] on 02/28/14 for a pre-TAVR visit but reported an abscessed tooth so TAVR was delayed until he could have dental evaluation/treatment.  (Our scheduler spoke with Dr. 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  BMI is consistent with obesity.  PCP is Dr. Donzetta Sprung. GI Dr. Bosie Clos who felt patient was not a candidate for liver transplant due to his multiple co-morbidities. Primary cardiologist is Dr. Purvis Sheffield, with last visit with Dr. Excell Seltzer.  EKG on 01/30/14: aflutter with variable AVB, incomplete right BBB.  Cardiac cath on 02/05/14:  -Coronary dominance: right -Left mainstem: Normal -Left anterior descending (LAD): Diffuse disease in the proximal vessel to 50-60%. Competitive flow from IMA graft.  -Left circumflex (LCx): Patent in the AV groove. The first and second OMs are occluded.  -Right coronary artery (RCA): 100% occlusion proximally. -LIMA to the LAD is widely patent.  -SVG to the RCA is widely patent. There is a 30% stenosis in the mid graft. Following this there is a patent stent.  -SVG sequential to OM 1 and OM 2 is patent there is 80% stenosis in the continuation segment of the SVG to OM 2.  -SVG to the diagonal is patent. There is a focal 80% stenosis in the proximal SVG.  -Left ventriculography: Not done  Final Conclusions:  1. Severe 3 vessel obstructive  CAD 2. Patent LIMA to the LAD 3. Patent SVG to RCA 4. Patent SVG to diagonal with 80% proximal stenosis. 5. Patent SVG to the OM1 and OM2. 80% stenosis in continuation of graft to OM2.  6. AV mean gradient of 20 mm Hg. Data may be influenced by presence of Afib. 7. Normal right heart pressures.   Echo on 01/30/14:  - Left ventricle: The cavity size was normal. Wall thickness was normal. Systolic function was normal. The estimated ejection fraction was in the range of 50% to 55%. Wall motion was normal; there were no regional wall motion abnormalities. - Aortic valve: There was severe stenosis. Valve area (VTI): 0.82 cm^2. Valve area (Vmax): 0.74 cm^2. Valve area (Vmean): 0.75 cm^2. - Left atrium: The atrium was moderately dilated. - Right atrium: The atrium was moderately dilated. - Pulmonary arteries: PA peak pressure: 33 mm Hg (S).  1V CXR 02/19/14 [facility]): Mild basilar scarring or atelectasis. No definite active process.  Plan for CBC, CMET, and PT/PTT (due to cirrhosis history) on arrival with lab review and patient evaluation by his assigned anesthesiologist at that time.  Velna Ochs [facility] Short Stay Center/Anesthesiology Phone [phone] 03/08/2014 6:02 PM

## 2014-03-11 ENCOUNTER — Encounter (HOSPITAL_COMMUNITY): Payer: Self-pay | Admitting: *Deleted

## 2014-03-11 MED ORDER — DEXTROSE 5 % IV SOLN
3.0000 g | Freq: Once | INTRAVENOUS | Status: AC
  Administered 2014-03-12: 3 g via INTRAVENOUS
  Filled 2014-03-11: qty 3000

## 2014-03-12 ENCOUNTER — Ambulatory Visit (HOSPITAL_COMMUNITY): Payer: Medicare Other | Admitting: Vascular Surgery

## 2014-03-12 ENCOUNTER — Ambulatory Visit (HOSPITAL_COMMUNITY)
Admission: RE | Admit: 2014-03-12 | Discharge: 2014-03-12 | Disposition: A | Discharge status: Home / Self Care | Payer: Medicare Other | Source: Ambulatory Visit | Attending: Dentistry | Admitting: Dentistry

## 2014-03-12 ENCOUNTER — Encounter (HOSPITAL_COMMUNITY): Payer: Self-pay | Admitting: *Deleted

## 2014-03-12 ENCOUNTER — Encounter (HOSPITAL_COMMUNITY)
Admission: RE | Disposition: A | Discharge status: Home / Self Care | Payer: Self-pay | Source: Ambulatory Visit | Attending: Dentistry

## 2014-03-12 DIAGNOSIS — K0530 Chronic periodontitis, unspecified: Secondary | ICD-10-CM

## 2014-03-12 DIAGNOSIS — K766 Portal hypertension: Secondary | ICD-10-CM | POA: Diagnosis not present

## 2014-03-12 DIAGNOSIS — I5033 Acute on chronic diastolic (congestive) heart failure: Secondary | ICD-10-CM | POA: Insufficient documentation

## 2014-03-12 DIAGNOSIS — J449 Chronic obstructive pulmonary disease, unspecified: Secondary | ICD-10-CM | POA: Insufficient documentation

## 2014-03-12 DIAGNOSIS — K5730 Diverticulosis of large intestine without perforation or abscess without bleeding: Secondary | ICD-10-CM | POA: Diagnosis not present

## 2014-03-12 DIAGNOSIS — E119 Type 2 diabetes mellitus without complications: Secondary | ICD-10-CM | POA: Insufficient documentation

## 2014-03-12 DIAGNOSIS — I2510 Atherosclerotic heart disease of native coronary artery without angina pectoris: Secondary | ICD-10-CM | POA: Insufficient documentation

## 2014-03-12 DIAGNOSIS — K219 Gastro-esophageal reflux disease without esophagitis: Secondary | ICD-10-CM | POA: Insufficient documentation

## 2014-03-12 DIAGNOSIS — M069 Rheumatoid arthritis, unspecified: Secondary | ICD-10-CM | POA: Insufficient documentation

## 2014-03-12 DIAGNOSIS — Z951 Presence of aortocoronary bypass graft: Secondary | ICD-10-CM | POA: Diagnosis not present

## 2014-03-12 DIAGNOSIS — I4892 Unspecified atrial flutter: Secondary | ICD-10-CM | POA: Insufficient documentation

## 2014-03-12 DIAGNOSIS — M5136 Other intervertebral disc degeneration, lumbar region: Secondary | ICD-10-CM | POA: Diagnosis not present

## 2014-03-12 DIAGNOSIS — K029 Dental caries, unspecified: Secondary | ICD-10-CM | POA: Diagnosis present

## 2014-03-12 DIAGNOSIS — M5030 Other cervical disc degeneration, unspecified cervical region: Secondary | ICD-10-CM | POA: Diagnosis not present

## 2014-03-12 DIAGNOSIS — Z6839 Body mass index (BMI) 39.0-39.9, adult: Secondary | ICD-10-CM | POA: Insufficient documentation

## 2014-03-12 DIAGNOSIS — I10 Essential (primary) hypertension: Secondary | ICD-10-CM | POA: Diagnosis not present

## 2014-03-12 DIAGNOSIS — M270 Developmental disorders of jaws: Secondary | ICD-10-CM | POA: Diagnosis not present

## 2014-03-12 DIAGNOSIS — K083 Retained dental root: Secondary | ICD-10-CM | POA: Diagnosis present

## 2014-03-12 DIAGNOSIS — I350 Nonrheumatic aortic (valve) stenosis: Secondary | ICD-10-CM | POA: Insufficient documentation

## 2014-03-12 DIAGNOSIS — K045 Chronic apical periodontitis: Secondary | ICD-10-CM | POA: Diagnosis present

## 2014-03-12 DIAGNOSIS — K7581 Nonalcoholic steatohepatitis (NASH): Secondary | ICD-10-CM | POA: Diagnosis not present

## 2014-03-12 DIAGNOSIS — K036 Deposits [accretions] on teeth: Secondary | ICD-10-CM | POA: Diagnosis present

## 2014-03-12 HISTORY — PX: MULTIPLE EXTRACTIONS WITH ALVEOLOPLASTY: SHX5342

## 2014-03-12 LAB — COMPREHENSIVE METABOLIC PANEL
ALBUMIN: 2.8 g/dL — AB (ref 3.5–5.2)
ALT: 41 U/L (ref 0–53)
AST: 51 U/L — AB (ref 0–37)
Alkaline Phosphatase: 111 U/L (ref 39–117)
Anion gap: 13 (ref 5–15)
BILIRUBIN TOTAL: 0.6 mg/dL (ref 0.3–1.2)
BUN: 18 mg/dL (ref 6–23)
CHLORIDE: 100 meq/L (ref 96–112)
CO2: 26 mEq/L (ref 19–32)
Calcium: 9.1 mg/dL (ref 8.4–10.5)
Creatinine, Ser: 0.8 mg/dL (ref 0.50–1.35)
GFR calc Af Amer: >90 mL/min (ref >90)
GFR calc non Af Amer: 89 mL/min — ABNORMAL LOW (ref >90)
Glucose, Bld: 87 mg/dL (ref 70–99)
Potassium: 3.5 mEq/L — ABNORMAL LOW (ref 3.7–5.3)
Sodium: 139 mEq/L (ref 137–147)
Total Protein: 6.6 g/dL (ref 6.0–8.3)

## 2014-03-12 LAB — CBC
HCT: 35.8 % — ABNORMAL LOW (ref 39.0–52.0)
HEMOGLOBIN: 10.9 g/dL — AB (ref 13.0–17.0)
MCH: 27.5 pg (ref 26.0–34.0)
MCHC: 30.4 g/dL (ref 30.0–36.0)
MCV: 90.4 fL (ref 78.0–100.0)
PLATELETS: 94 10*3/uL — AB (ref 150–400)
RBC: 3.96 MIL/uL — AB (ref 4.22–5.81)
RDW: 18.7 % — ABNORMAL HIGH (ref 11.5–15.5)
WBC: 4.9 10*3/uL (ref 4.0–10.5)

## 2014-03-12 LAB — SURGICAL PCR SCREEN
MRSA, PCR: NEGATIVE
Staphylococcus aureus: NEGATIVE

## 2014-03-12 LAB — GLUCOSE, CAPILLARY
Glucose-Capillary: 88 mg/dL (ref 70–99)
Glucose-Capillary: 85 mg/dL (ref 70–99)
Glucose-Capillary: 85 mg/dL (ref 70–99)

## 2014-03-12 LAB — PROTIME-INR
INR: 1.22 (ref 0.00–1.49)
Prothrombin Time: 15.6 seconds — ABNORMAL HIGH (ref 11.6–15.2)

## 2014-03-12 LAB — APTT: aPTT: 33 seconds (ref 24–37)

## 2014-03-12 SURGERY — MULTIPLE EXTRACTION WITH ALVEOLOPLASTY
Anesthesia: Monitor Anesthesia Care | Site: Mouth

## 2014-03-12 MED ORDER — MUPIROCIN 2 % EX OINT
1.0000 "application " | TOPICAL_OINTMENT | Freq: Once | CUTANEOUS | Status: AC
  Administered 2014-03-12: 1 via TOPICAL

## 2014-03-12 MED ORDER — OXYCODONE HCL 5 MG/5ML PO SOLN: 5.0000 mg | Freq: Once | ORAL | Status: AC | PRN

## 2014-03-12 MED ORDER — FENTANYL CITRATE 0.05 MG/ML IJ SOLN
INTRAMUSCULAR | Status: AC
  Filled 2014-03-12: qty 5

## 2014-03-12 MED ORDER — SUCCINYLCHOLINE CHLORIDE 20 MG/ML IJ SOLN
INTRAMUSCULAR | Status: AC
  Filled 2014-03-12: qty 1

## 2014-03-12 MED ORDER — BUPIVACAINE-EPINEPHRINE (PF) 0.5% -1:200000 IJ SOLN
INTRAMUSCULAR | Status: AC
  Filled 2014-03-12: qty 3.6

## 2014-03-12 MED ORDER — MIDAZOLAM HCL 5 MG/5ML IJ SOLN
INTRAMUSCULAR | Status: DC | PRN
  Administered 2014-03-12: 2 mg via INTRAVENOUS

## 2014-03-12 MED ORDER — LIDOCAINE-EPINEPHRINE 2 %-1:100000 IJ SOLN
INTRAMUSCULAR | Status: DC | PRN
  Administered 2014-03-12: 9 mL

## 2014-03-12 MED ORDER — 0.9 % SODIUM CHLORIDE (POUR BTL) OPTIME
TOPICAL | Status: DC | PRN
  Administered 2014-03-12: 1000 mL

## 2014-03-12 MED ORDER — ROCURONIUM BROMIDE 50 MG/5ML IV SOLN
INTRAVENOUS | Status: AC
  Filled 2014-03-12: qty 1

## 2014-03-12 MED ORDER — OXYCODONE HCL 5 MG PO TABS
ORAL_TABLET | ORAL | Status: AC
  Filled 2014-03-12: qty 1

## 2014-03-12 MED ORDER — BUPIVACAINE-EPINEPHRINE 0.5% -1:200000 IJ SOLN
INTRAMUSCULAR | Status: DC | PRN
  Administered 2014-03-12: 1.8 mL
  Administered 2014-03-12: 5.4 mL

## 2014-03-12 MED ORDER — PROPOFOL 10 MG/ML IV BOLUS
INTRAVENOUS | Status: AC
  Filled 2014-03-12: qty 20

## 2014-03-12 MED ORDER — LIDOCAINE-EPINEPHRINE 2 %-1:100000 IJ SOLN
INTRAMUSCULAR | Status: AC
  Filled 2014-03-12: qty 8.5

## 2014-03-12 MED ORDER — GELATIN ABSORBABLE 12-7 MM EX MISC: CUTANEOUS | Status: DC | PRN

## 2014-03-12 MED ORDER — ONDANSETRON HCL 4 MG/2ML IJ SOLN
INTRAMUSCULAR | Status: DC | PRN
  Administered 2014-03-12: 4 mg via INTRAVENOUS

## 2014-03-12 MED ORDER — BUPIVACAINE-EPINEPHRINE (PF) 0.5% -1:200000 IJ SOLN
INTRAMUSCULAR | Status: AC
  Filled 2014-03-12: qty 1.8

## 2014-03-12 MED ORDER — MUPIROCIN 2 % EX OINT
TOPICAL_OINTMENT | CUTANEOUS | Status: AC
Start: 2014-03-12 — End: 2014-03-12
  Administered 2014-03-12: 1 via TOPICAL
  Filled 2014-03-12: qty 22

## 2014-03-12 MED ORDER — HEMOSTATIC AGENTS (NO CHARGE) OPTIME
TOPICAL | Status: DC | PRN
  Administered 2014-03-12: 1

## 2014-03-12 MED ORDER — ONDANSETRON HCL 4 MG/2ML IJ SOLN: 4.0000 mg | Freq: Once | INTRAMUSCULAR | Status: DC | PRN

## 2014-03-12 MED ORDER — ONDANSETRON HCL 4 MG/2ML IJ SOLN
INTRAMUSCULAR | Status: AC
  Filled 2014-03-12: qty 2

## 2014-03-12 MED ORDER — OXYCODONE-ACETAMINOPHEN 5-325 MG PO TABS: 1.0000 | ORAL_TABLET | ORAL | Status: AC | PRN

## 2014-03-12 MED ORDER — LACTATED RINGERS IV SOLN
INTRAVENOUS | Status: DC | PRN
  Administered 2014-03-12: 07:00:00 via INTRAVENOUS

## 2014-03-12 MED ORDER — MIDAZOLAM HCL 2 MG/2ML IJ SOLN
INTRAMUSCULAR | Status: AC
  Filled 2014-03-12: qty 2

## 2014-03-12 MED ORDER — AMINOCAPROIC ACID SOLUTION 5% (50 MG/ML)
10.0000 mL | ORAL | Status: DC
  Administered 2014-03-12: 10 mL via ORAL
  Filled 2014-03-12: qty 100

## 2014-03-12 MED ORDER — MEPERIDINE HCL 25 MG/ML IJ SOLN: 6.2500 mg | INTRAMUSCULAR | Status: DC | PRN

## 2014-03-12 MED ORDER — HYDROMORPHONE HCL 1 MG/ML IJ SOLN
0.2500 mg | INTRAMUSCULAR | Status: DC | PRN
Start: 2014-03-12 — End: 2014-03-12

## 2014-03-12 MED ORDER — LIDOCAINE HCL (CARDIAC) 20 MG/ML IV SOLN
INTRAVENOUS | Status: AC
  Filled 2014-03-12: qty 5

## 2014-03-12 MED ORDER — OXYMETAZOLINE HCL 0.05 % NA SOLN
NASAL | Status: AC
  Filled 2014-03-12: qty 15

## 2014-03-12 MED ORDER — OXYCODONE HCL 5 MG PO TABS
5.0000 mg | ORAL_TABLET | Freq: Once | ORAL | Status: AC | PRN
  Administered 2014-03-12: 5 mg via ORAL

## 2014-03-12 MED ORDER — FENTANYL CITRATE 0.05 MG/ML IJ SOLN
INTRAMUSCULAR | Status: DC | PRN
  Administered 2014-03-12: 50 ug via INTRAVENOUS
  Administered 2014-03-12: 100 ug via INTRAVENOUS

## 2014-03-12 SURGICAL SUPPLY — 36 items
ALCOHOL 70% 16 OZ (MISCELLANEOUS) ×3 IMPLANT
ATTRACTOMAT 16X20 MAGNETIC DRP (DRAPES) ×3 IMPLANT
BLADE SURG 15 STRL LF DISP TIS (BLADE) ×2 IMPLANT
BLADE SURG 15 STRL SS (BLADE) ×4
COVER SURGICAL LIGHT HANDLE (MISCELLANEOUS) ×3 IMPLANT
GAUZE PACKING FOLDED 2  STR (GAUZE/BANDAGES/DRESSINGS) ×2
GAUZE PACKING FOLDED 2 STR (GAUZE/BANDAGES/DRESSINGS) ×1 IMPLANT
GAUZE SPONGE 4X4 16PLY XRAY LF (GAUZE/BANDAGES/DRESSINGS) ×6 IMPLANT
GLOVE BIOGEL PI IND STRL 6 (GLOVE) ×2 IMPLANT
GLOVE BIOGEL PI INDICATOR 6 (GLOVE) ×4
GLOVE SKINSENSE STRL SZ6.0 (GLOVE) ×3 IMPLANT
GLOVE SURG ORTHO 8.0 STRL STRW (GLOVE) ×3 IMPLANT
GLOVE SURG SS PI 6.0 STRL IVOR (GLOVE) ×3 IMPLANT
GOWN STRL REUS W/ TWL LRG LVL3 (GOWN DISPOSABLE) ×1 IMPLANT
GOWN STRL REUS W/TWL 2XL LVL3 (GOWN DISPOSABLE) ×3 IMPLANT
GOWN STRL REUS W/TWL LRG LVL3 (GOWN DISPOSABLE) ×2
KIT BASIN OR (CUSTOM PROCEDURE TRAY) ×3 IMPLANT
KIT ROOM TURNOVER OR (KITS) ×3 IMPLANT
MANIFOLD NEPTUNE WASTE (CANNULA) ×3 IMPLANT
NEEDLE BLUNT 16X1.5 OR ONLY (NEEDLE) ×3 IMPLANT
NS IRRIG 1000ML POUR BTL (IV SOLUTION) ×3 IMPLANT
PACK EENT II TURBAN DRAPE (CUSTOM PROCEDURE TRAY) ×3 IMPLANT
PAD ARMBOARD 7.5X6 YLW CONV (MISCELLANEOUS) ×3 IMPLANT
SPONGE SURGIFOAM ABS GEL 100 (HEMOSTASIS) ×3 IMPLANT
SPONGE SURGIFOAM ABS GEL 12-7 (HEMOSTASIS) IMPLANT
SPONGE SURGIFOAM ABS GEL SZ50 (HEMOSTASIS) IMPLANT
SUCTION FRAZIER TIP 10 FR DISP (SUCTIONS) ×3 IMPLANT
SUT CHROMIC 3 0 PS 2 (SUTURE) ×6 IMPLANT
SUT SILK 2 0 (SUTURE) ×2
SUT SILK 2 0 TIES 10X30 (SUTURE) ×3 IMPLANT
SUT SILK 2-0 18XBRD TIE 12 (SUTURE) ×1 IMPLANT
SYR 50ML SLIP (SYRINGE) ×3 IMPLANT
TOWEL OR 17X26 10 PK STRL BLUE (TOWEL DISPOSABLE) ×3 IMPLANT
TUBE CONNECTING 12'X1/4 (SUCTIONS) ×1
TUBE CONNECTING 12X1/4 (SUCTIONS) ×2 IMPLANT
YANKAUER SUCT BULB TIP NO VENT (SUCTIONS) ×3 IMPLANT

## 2014-03-12 NOTE — Transfer of Care (Signed)
Immediate Anesthesia Transfer of Care Note  Patient: [name]  Procedure(s) Performed: Procedure(s): MULTIPLE EXTRACTION OF TOOTH #'S 2,6,7,8,9,10,11,14,18,20,29,31 WITH ALVEOLOPLASTY AND GROSS DEBRIDMENT OF REMAINING TEETH (N/A)  Patient Location: PACU  Anesthesia Type:MAC  Level of Consciousness: awake, alert  and oriented  Airway & Oxygen Therapy: Patient Spontanous Breathing and Patient connected to nasal cannula oxygen  Post-op Assessment: Report given to PACU RN, Post -op Vital signs reviewed and stable and Patient moving all extremities  Post vital signs: Reviewed and stable  Complications: No apparent anesthesia complications

## 2014-03-12 NOTE — Anesthesia Preprocedure Evaluation (Addendum)
Anesthesia Evaluation  Patient identified by MRN, date of birth, ID band Patient awake    Reviewed: Allergy & Precautions, H&P , NPO status , Patient's Chart, lab work & pertinent test results  Airway Mallampati: II  TM Distance: >3 FB Neck ROM: Full    Dental   Pulmonary shortness of breath and with exertion, COPD oxygen dependent, former smoker,          Cardiovascular hypertension, Pt. on medications + CAD, + CABG and +CHF  CV Studies: Cardiac cath on 02/05/14:  -Coronary dominance: right -Left mainstem: Normal -Left anterior descending (LAD): Diffuse disease in the proximal vessel to 50-60%. Competitive flow from IMA graft.  -Left circumflex (LCx): Patent in the AV groove. The first and second OMs are occluded.  -Right coronary artery (RCA): 100% occlusion proximally. -LIMA to the LAD is widely patent.  -SVG to the RCA is widely patent. There is a 30% stenosis in the mid graft. Following this there is a patent stent.  -SVG sequential to OM 1 and OM 2 is patent there is 80% stenosis in the continuation segment of the SVG to OM 2.  -SVG to the diagonal is patent. There is a focal 80% stenosis in the proximal SVG.  -Left ventriculography: Not done  Final Conclusions:  1. Severe 3 vessel obstructive CAD 2. Patent LIMA to the LAD 3. Patent SVG to RCA 4. Patent SVG to diagonal with 80% proximal stenosis. 5. Patent SVG to the OM1 and OM2. 80% stenosis in continuation of graft to OM2.  6. AV mean gradient of 20 mm Hg. Data may be influenced by presence of Afib. 7. Normal right heart pressures.   Echo on 01/30/14:  - Left ventricle: The cavity size was normal. Wall thickness was normal. Systolic function was normal. The estimated ejection fraction was in the range of 50% to 55%. Wall motion was normal; there were no regional wall motion abnormalities. - Aortic valve: There was severe stenosis. Valve area (VTI): 0.82 cm^2.  Valve area (Vmax): 0.74 cm^2. Valve area (Vmean): 0.75 cm^2. - Left atrium: The atrium was moderately dilated. - Right atrium: The atrium was moderately dilated. - Pulmonary arteries: PA peak pressure: 33 mm Hg (S).    Neuro/Psych    GI/Hepatic (+) Cirrhosis -      ,   Endo/Other  diabetes, Type 2, Insulin Dependent  Renal/GU      Musculoskeletal   Abdominal   Peds  Hematology   Anesthesia Other Findings   Reproductive/Obstetrics                           Anesthesia Physical Anesthesia Plan  ASA: IV  Anesthesia Plan: MAC   Post-op Pain Management:    Induction: Intravenous  Airway Management Planned: Natural Airway  Additional Equipment:   Intra-op Plan:   Post-operative Plan:   Informed Consent: I have reviewed the patients History and Physical, chart, labs and discussed the procedure including the risks, benefits and alternatives for the proposed anesthesia with the patient or authorized representative who has indicated his/her understanding and acceptance.     Plan Discussed with: CRNA and Surgeon  Anesthesia Plan Comments: (Due to severe co-morbidities: COPD, AS, CHF,CAD,Obesity we will procede under MAC. Discussed with Dr Kristin Bruins.)       Anesthesia Quick Evaluation

## 2014-03-12 NOTE — Op Note (Signed)
OPERATIVE REPORT  Patient:            [name] Date of Birth:  February 17, 1946 MRN:                [id number]   DATE OF PROCEDURE:  03/12/2014  PREOPERATIVE DIAGNOSES: 1. Aortic stenosis 2. Preaortic valve replacement dental protocol 3. Chronic apical periodontitis 4. Chronic periodontitis 5. Multiple retained root segments 6. Dental caries 7. Mandibular left lingual torus  POSTOPERATIVE DIAGNOSES: 1. Aortic stenosis 2. Preaortic valve replacement dental protocol 3. Chronic apical periodontitis 4. Chronic periodontitis 5. Multiple retained root segments 6. Dental caries 7. Mandibular left lingual torus  OPERATIONS: 1. Multiple extraction of tooth numbers 2, 6, 7, 8, 9, 10, 11, 14, 18, 20, 29, and 31 2. 4 Quadrants of alveoloplasty 3. Gross debridement of remaining dentition   SURGEON: Charlynne Pander, DDS  ASSISTANT: Rory Percy, (dental assistant)  ANESTHESIA: monitored anesthesia care per the anesthesia team  MEDICATIONS: 1. Ancef 3 g IV prior to invasive dental procedures. 2. Local anesthesia with a total utilization of 5 carpules each containing 34 mg of lidocaine with 0.017 mg of epinephrine as well as 4 carpules each containing 9 mg of bupivacaine with 0.009 mg of epinephrine.  SPECIMENS: There were 12 teeth that were discarded.  DRAINS: None  CULTURES: None  COMPLICATIONS: None   ESTIMATED BLOOD LOSS: 150 mLs.  INTRAVENOUS FLUIDS: 400 mLs of Lactated ringers solution.  INDICATIONS: The patient was recently diagnosed with severe aortic stenosis.  A medically necessary dental consultation was then requested to evaluate poor dentition prior to heart valve surgery.  The patient was examined and treatment planned for multiple extractions with alveoloplasty and pre-prosthetic surgery as needed along with gross debridement of remaining dentition in the operating room.  This treatment plan was formulated to decrease the risks and complications  associated with dental infection from affecting the patient's systemic health and the anticipated aortic valve replacement.  OPERATIVE FINDINGS: Patient was examined in operating room number 2.  The teeth were identified for extraction. The mandibular left lingual torus was further evaluated for removal and it was determined that this would not need to be reduced at this time. The patient was noted be affected by chronic periodontitis, accretions, dental caries, chronic apical periodontitis, and multiple retained root segments.   DESCRIPTION OF PROCEDURE: Patient was brought to the main operating room number 2. Patient was then placed in the supine position on the operating table. Monitored anesthesia care was then induced per the anesthesia team. The patient was then prepped and draped in the usual manner for dental medicine procedure. A timeout was performed. The patient was identified and procedures were verified. The oral cavity was then thoroughly examined with the findings noted above. The patient was then ready for dental medicine procedure as follows:  Local anesthesia was then administered sequentially with a total utilization of 5 carpules each containing 34 mg of lidocaine with 0.017 mg of epinephrine as well as 4 carpules  each containing 9 mg bupivacaine with 0.009 mg of epinephrine.  The Maxillary left and right quadrants first approached. Anesthesia was then delivered utilizing infiltration with lidocaine with epinephrine. A #15 blade incision was then made from the maxillary right tuberosity and extended to the distal of #15.  A  surgical flap was then carefully reflected. Bone was then removed with a surgical handpiece and bur and copious amounts of sterile water around tooth numbers 6, 11, and 14.  The maxillary teeth  were then subluxated with a series of straight elevators.  Retained root tips in the area of #2 was then approached and removed with a rongeur. This left a retained root  tip that required further bone removal around it with a surgical handpiece and bur and copious amounts sterile water. The root tip was then elevated out with a root tip pick without complications. Tooth numbers 6, 7, 8, 9, 10, 11 were then removed with a 150 forceps without complications. Tooth #14 was then approached and root tips were then removed without complication. Alveoloplasty was then performed utilizing a ronguers and bone file. The surgical site was then irrigated with copious amounts of sterile saline. The tissues were approximated and trimmed appropriately. A piece of Surgifoam was placed initially extraction socket appropriately. The maxillary right surgical site was then closed from the maxillary right tuberosity and extended the mesial #8 utilizing 3-0 chromic gut suture in a continuous interrupted suture technique 1. The maxillary left surgical site was then closed from the distal of #15 extended to the mesial #9 utilizing 3-0 chromic gut suture in a continuous interrupted suture technique 1.  At this point time, the mandibular quadrants were approached. The patient was given bilateral inferior alveolar nerve blocks and long buccal nerve blocks utilizing the bupivacaine with epinephrine. Further infiltration was then achieved utilizing the bupivacaine with epinephrine. A 15 blade incision was then made from the distal of number 18 and extended to the mesial of #20.  A surgical flap was then reflected appropriately. Appropriate amounts of buccal and interseptal bone were then removed around the retained roots numbers 18 and 20 utilizing a surgical handpiece and copious amount of sterile water. Tooth numbers 18 and 21 then removed with a 151 forceps without complications.Alveoloplasty was then performed utilizing a rongeur and bone file. The surgical site was irrigated with copious postural saline. A piece of Surgifoam was placed into the extraction socket appropriately. The surgical site was then  closed from the mesial of #17 and extended to the distal of #21 utilizing 3-0 chromic gut suture in a continuous interrupted suture technique 1.  The mandibular right surgical site was then approached. A 15 blade incision was then made from the mesial of #32 and extended to the mesial of #28.  A surgical flap was then carefully reflected. Bone was then removed around tooth numbers 29 and 31 utilizing a surgical handpiece and bur and copious amounts sterile water. The coronal aspect of tooth #31 was then removed with a 23 forceps leaving the roots remaining. The roots were then elevated out with a series of cryers elevators without complication.  Alveoloplasty was then performed utilizing a rongeurs and bone file. The tissues were approximated and trimmed appropriately. The surgical sites were then irrigated with copious amounts of sterile saline. A piece of Surgifoam was then placed in the extraction socket appropriately.  The mandibular right surgical site was then closed from the distal of 31 and extended to the distal of #28 utilizing 3-0 chromic gut suture in a continuous interrupted suture technique x1. An interproximal suture was placed between tooth numbers 27 and 28 with 3-0 chromic gut material.  At this point time a gross debridement procedure was performed on the remaining lower teeth utilizing a sonic scaler. Further accretions were then removed utilizing a series of hand curettes. A sonic scaler was then again used to refine removal of accretions.  At this point time, the entire mouth was irrigated with copious amounts of sterile saline.  The patient was examined for complications, seeing none, the dental medicine procedure was deemed to be complete. The throat pack was removed at this time. A series of 4 x 4 gauze with Amicar rinse applied were placed in the mouth to aid hemostasis. The patient was then handed over to the anesthesia team for final disposition. After an appropriate amount of time,  the patient was taken to the postanesthsia care unit in a good condition. All counts were correct for the dental medicine procedure.the patient will continue Amicar 5% oral rinses every hour for the next 10 hours to aid to obtain hemostasis. Patient is to use in a swish and spit manner. Patient was prescribed Percocet 5/325. Patient is to use one to 2 tablets every 4-6 hours as needed for moderate to severe dental pain.   Charlynne Pander, DDS.

## 2014-03-12 NOTE — Progress Notes (Signed)
PRE-OPERATIVE NOTE:  03/12/2014 [name] [id number]  VITALS: BP 109/78 mmHg  Pulse 104  Temp(Src) 98.3 F (36.8 C) (Oral)  Resp 18  Ht 5\' 11"  (1.803 m)  Wt 283 lb 9.6 oz (128.64 kg)  BMI 39.57 kg/m2  SpO2 94%  CBC CBC Latest Ref Rng 02/07/2014 02/06/2014 02/05/2014  WBC 4.0 - 10.5 K/uL 6.2 5.5 4.4  Hemoglobin 13.0 - 17.0 g/dL 04/07/2014) 9.6(Q) 2.2(L)  Hematocrit 39.0 - 52.0 % 28.7(L) 28.9(L) 31.0(L)  Platelets 150 - 400 K/uL 137(L) 111(L) 103(L)      BMET    Component Value Date/Time   NA 135* 02/07/2014 1300   K 4.3 02/07/2014 1300   CL 97 02/07/2014 1300   CO2 29 02/07/2014 1300   GLUCOSE 186* 02/07/2014 1300   BUN 11 02/07/2014 1300   CREATININE 0.75 02/07/2014 1300   CALCIUM 8.9 02/07/2014 1300   GFRNONAA >90 02/07/2014 1300   GFRAA >90 02/07/2014 1300    Lab Results  Component Value Date   INR 1.19 02/01/2014   INR 0.98 07/25/2009   INR 1.1 12/25/2008   No results found for: PTT   12/27/2008 presents for dental procedures in the operating room.   SUBJECTIVE: The patient denies any acute medical or dental changes and agrees to proceed with treatment as planned.  EXAM: No sign of acute dental changes.  ASSESSMENT: Patient is affected by chronic apical periodontitis, chronic periodontitis, multiple retained root segments, accretions, dental caries, and mandibular left torus.  PLAN: Patient agrees to proceed with treatment as planned in the operating room as previously discussed and accepts the risks, benefits, and complications of the proposed treatment. Patient is aware of the risk for bleeding, bruising, swelling, infection, pain, nerve damage, sinus involvement, root tip fracture, mandible fracture, and the risks of complications associated with the anesthesia. Patient also is aware of the potential for cardiovascular complications up to and including death as well as other complications not mentioned above.   Norton Blizzard, DDS

## 2014-03-12 NOTE — H&P (Signed)
03/12/2014  03/12/2014  Patient Name:   [name] Date of Birth:   March 09, 1946 Medical Record Number: [id number]  BP 109/78 mmHg  Pulse 104  Temp(Src) 98.3 F (36.8 C) (Oral)  Resp 18  Ht 5\' 11"  (1.803 m)  Wt 283 lb 9.6 oz (128.64 kg)  BMI 39.57 kg/m2  SpO2 94%   [name] is a 68 -year-old male that presents for multiple dental extractions with alveoloplasty and pre-prosthetic surgery as indicated in the operating room along with gross debridement of remaining dentition.  This will be under monitored anesthesia care per discussion with Anesthesiology. Patient denies acute medical or dental problems. Please use Dr. Earmon Phoenix note of 02/21/2014 as the H&P for the dental operating room procedure.  Charlynne Pander, DDS       Background: The patient is followed for aortic stenosis, atrial flutter, and coronary artery disease with previous CABG. He underwent CABG in 1998 with a LIMA to LAD, vein graft to diagonal, sequential vein graft to first and second OM branches of the left circumflex, and vein graft to the distal RCA. He also has undergone PCI with stenting of the vein graft to the RCA in 2010. 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   HPI: 68 year old gentleman presenting for followup evaluation. The patient was recently hospitalized with acute on chronic diastolic congestive heart failure. During that hospitalization he was noted to be anemic and was transfused one unit of packed red blood cells. He was evaluated by Dr. Cornelius Moras during his hospitalization and continues to undergo workup for TAVR to address his severe aortic stenosis.   The patient had been improving since his hospitalization. He received IV diuretics while hospitalized, followed by an increased dose of  oral Lasix at discharge. His weight is down about 20 pounds in his breathing is improved. However, approximately 48 hours ago he became acutely short of breath and stridorous. He was taken to [facility] and admitted for overnight observation. The patient was diagnosed with laryngospasm and was treated with steroids. He was started on proton next for laryngopharyngeal reflux. The patient underwent flexible laryngoscopy and was noted to have aryteynoid fixation and decreased mobility of the vocal cords, but no evidence of a vocal cord lesion. A CT scan of the neck showed no evidence of mass lesion in the superior mediastinum or neck. The patient has ENT followup next Monday. He feels much better since his hospitalization and denies any shortness of breath at rest. He has not had chest pain or syncope. He has no other complaints at present.  Studies:  Cardiac CTA: FINDINGS:  Aortic Valve: Severely thickened and calcified with moderately  restricted leaflet opening.  Aorta: Normal size, mild diffuse calcifications, no dissection.  Sinotubular Junction: 27 x 26 mm  Ascending Thoracic Aorta: 31 x 31 mm  Aortic Arch: 27 x 26 mm  Descending Thoracic Aorta: 28 x 27 mm  Sinus of Valsalva Measurements:  Non-coronary: 31 mm  Right -coronary: 30 mm  Left -coronary: 31 mm  Coronary Artery Height above Annulus:  Left Main: 10 mm  Right Coronary: 16 mm  Virtual Basal Annulus Measurements:  Maximum/Minimum Diameter: 28 x 25 mm  Perimeter: 99 mm  Area: 560 mm2  Coronary Arteries: The study was performed without use of NTG and is  suboptimal for coronary evaluation.  Optimum Fluoroscopic  Angle for Delivery: RAO 1 CRA 1  IMPRESSION:  1. Severely thickened and calcified aortic valve with annular  measurements suitable for delivery of soft 29 mm Edward SAPIEN XT  valve.  2. Optimum Fluoroscopic Angle for Delivery: RAO 1 CRA 1  3. Sheath to tip distance sufficient for  transaortic approach (76  mm).  [name]   CTA Chest/Abdomen/Pelvis: VASCULAR MEASUREMENTS PERTINENT TO TAVR:  AORTA:  Minimal Aortic Diameter - 14 x 12 mm  Severity of Aortic Calcification - moderate to severe  RIGHT PELVIS:  Right Common Iliac Artery -  Minimal Diameter - 7.6 x 5.9 mm  Tortuosity - Mild  Calcification - Severe  Right External Iliac Artery -  Minimal Diameter - 7.8 x 4.2 mm  Tortuosity - Mild  Calcification - Moderate  Right Common Femoral Artery -  Minimal Diameter - 8.7 x 4.9 mm  Tortuosity - Mild  Calcification - Severe  LEFT PELVIS:  Left Common Iliac Artery -  Minimal Diameter - 6.9 x 6.2 mm  Tortuosity - Mild  Calcification - Severe  Left External Iliac Artery -  Minimal Diameter - 6.6 x 4.8 mm  Tortuosity - Mild  Calcification - Moderate  Left Common Femoral Artery -  Minimal Diameter - 5.2 x 4.5 mm  Tortuosity - Mild  Calcification - Severe  Review of the MIP images confirms the above findings.  IMPRESSION:  1. Findings and measurements pertinent to potential TAVR procedure,  as detailed above. Although the patient does have extensive pelvic  arterial calcification, and does not have suitable access from the  left side, there appears to be potentially suitable access on the  right side.  2. Mild diffuse bronchial wall thickening with mild centrilobular  and paraseptal emphysema; imaging findings suggestive of underlying  COPD.  3. Stigmata of cirrhosis with portal hypertension and associated  splenomegaly, as above.  4. Cholelithiasis without evidence to suggest acute cholecystitis at  this time.  5. Mild colonic diverticulosis without findings to suggest acute  diverticulitis at this time.  6. In addition to the vascular findings pertinent to potential TAVR  procedure, there is significant atherosclerosis at the ostia of the  celiac axis and SMA, with greater than 50% stenosis at  the origin of  the SMA.   Outpatient Encounter Prescriptions as of 02/21/2014  Medication Sig  . albuterol-ipratropium (COMBIVENT) 18-103 MCG/ACT inhaler Inhale 2 puffs into the lungs every 6 (six) hours as needed for wheezing or shortness of breath.   Marland Kitchen ascorbic acid (VITAMIN C) 500 MG tablet Take 500 mg by mouth daily.   Marland Kitchen aspirin 81 MG tablet Take 1 tablet (81 mg total) by mouth daily.  . budesonide (PULMICORT) 0.25 MG/2ML nebulizer solution Take 0.25 mg by nebulization 2 (two) times daily.  . cetirizine (ZYRTEC) 10 MG tablet Take 10 mg by mouth at bedtime.  Marland Kitchen co-enzyme Q-10 30 MG capsule Take 30 mg by mouth at bedtime.   Marland Kitchen desonide (DESOWEN) 0.05 % cream Apply 1 application topically 2 (two) times daily.  Marland Kitchen diltiazem (CARDIZEM) 30 MG tablet Take 1 tablet (30 mg total) by mouth 2 (two) times daily.  Marland Kitchen ezetimibe (ZETIA) 10 MG tablet Take 10 mg by mouth at bedtime.   . formoterol (PERFOROMIST) 20 MCG/2ML nebulizer solution Take 2 mLs (20 mcg total) by nebulization 2 (two) times daily. Use in nebulizer twice daily perfectly regularly  . furosemide (LASIX) 40 MG tablet Take 2 tablets (80 mg total) by mouth daily. Take 2 tablets  twice a day for 3 days, then 2 tablets daily. Or as to rectum.  Marland Kitchen guaifenesin (ROBITUSSIN) 100 MG/5ML syrup Take 200 mg by mouth 3 (three) times daily as needed for cough.  . insulin NPH-insulin regular (NOVOLIN 70/30) (70-30) 100 UNIT/ML injection Inject 80 Units into the skin 2 (two) times daily.   Marland Kitchen ipratropium-albuterol (DUONEB) 0.5-2.5 (3) MG/3ML SOLN Take 3 mLs by nebulization 4 (four) times daily as needed (for shortness of breath).   Marland Kitchen ketoconazole (NIZORAL) 2 % cream Apply 1 application topically 2 (two) times daily as needed for irritation.   Marland Kitchen loratadine (CLARITIN) 10 MG tablet Take 10 mg by mouth every morning.   Marland Kitchen losartan (COZAAR) 100 MG tablet Take 50 mg by mouth at bedtime.   . metoprolol succinate  (TOPROL-XL) 25 MG 24 hr tablet Take 1 tablet (25 mg total) by mouth 2 (two) times daily.  . Multiple Vitamin (MULTIVITAMIN) tablet Take 1 tablet by mouth daily.   Marland Kitchen oxyCODONE-acetaminophen (PERCOCET) 5-325 MG per tablet Take 1 tablet by mouth every 4 (four) hours as needed for moderate pain.   . potassium chloride SA (KLOR-CON M20) 20 MEQ tablet Take 1 tablet (20 mEq total) by mouth daily.  . predniSONE (DELTASONE) 5 MG tablet Take 5 mg by mouth daily.   . metFORMIN (GLUCOPHAGE) 1000 MG tablet Take 1 tablet (1,000 mg total) by mouth 2 (two) times daily. Hold for 48 hours, restart on 02/10/2014.    No Known Allergies  Past Medical History  Diagnosis Date  . Rheumatoid arthritis(714.0)     chronic Prednisone  . COPD (chronic obstructive pulmonary disease)   . Elevated liver function tests   . Hypertension   . Diabetes mellitus   . Aortic stenosis     a. 2D ECHO: 01/30/2014: ef 50-55%. No RWMAs. Severe EQ:ASTMH area (VTI): 0.82 cm^2. Valve area (Vmax): 0.74 cm^2. Valve area (Vmean): 0.75 Cm^2. Mod LA/RA dilation. PA peak pressure 33  . CAD (coronary artery disease)   . GERD (gastroesophageal reflux disease)   . Chronic atrial flutter   . 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 Degenerative disc disease, cervical   . Degenerative disc disease, lumbar 2010  . Morbid obesity   . Coronary atherosclerosis of artery bypass graft 2010  . Acute on chronic diastolic heart failure 02/01/2014  . Chronic diastolic congestive heart failure 02/01/2014  . Epistaxis     Developed on Xarelto therapy  . Cirrhosis 02/01/2014  . Portal hypertension 02/01/2014  . Chronic pancreatitis 02/01/2014    Noted on CT scan    family history includes Asthma in his child; Cancer in his father; Emphysema in his brother; Hypertension  in his mother; Liver cancer in his paternal grandfather; Rheum arthritis in his maternal aunt; Stroke in his brother.   ROS: Negative except as per HPI  BP 118/74  Pulse 96  Ht 5\' 11"  (1.803 m)  Wt 282 lb (127.914 kg)  BMI 39.35 kg/m2  PHYSICAL EXAM: Pt is alert and oriented, pleasant obese male in NAD HEENT: normal Neck: JVP - normal, carotids 2+= without bruits Lungs: CTA bilaterally CV: Irregular with grade 3/6 harsh systolic crescendo decrescendo murmur at the left sternal border Abd: soft, NT, Positive BS, no hepatomegaly, obese Ext: 1+ pretibial edema bilaterally, distal pulses intact and equal Skin: warm/dry no rash  ASSESSMENT AND PLAN: 68 year old gentleman  with severe symptomatic aortic stenosis and a multitude of cardiac and general medical problems as outlined above. He has been evaluated by the multidisciplinary heart team and we have had extensive discussion about his treatment options. I have specifically reviewed the pros and cons of conventional aortic valve replacement, TAVR, and palliative medical therapy with the patient and his wife. I have personally reviewed his echo, cardiac catheterization, and CT studies. He appears to have aortic valve anatomy suitable for a 29 mm Sapien XT. His pelvic arterial anatomy is borderline on the right side and not suitable for TAVR access on the left. We will review his studies as a team to determine best option for access (Transfemoral versus Transapical).   Following the decision to proceed with transcatheter aortic valve replacement, a discussion has been held regarding what types of management strategies would be attempted intraoperatively in the event of life-threatening complications, including whether or not the patient would be considered a candidate for the use of cardiopulmonary bypass and/or conversion to open sternotomy for attempted surgical intervention. The patient has been advised of a variety of complications that might  develop including but not limited to risks of death, stroke, paravalvular leak, aortic dissection or other major vascular complications, aortic annulus rupture, device embolization, cardiac rupture or perforation, mitral regurgitation, acute myocardial infarction, arrhythmia, heart block or bradycardia requiring permanent pacemaker placement, congestive heart failure, respiratory failure, renal failure, pneumonia, infection, other late complications related to structural valve deterioration or migration, or other complications that might ultimately cause a temporary or permanent loss of functional independence or other long term morbidity. The patient provides full informed consent for the procedure as described and all questions were answered.  The patient is tentatively scheduled for TAVR on 11/3 pending stability of his upper airway symptoms with ENT follow-up next week and surgical second opinion by Dr Laneta Simmers 10/23. Extensive discussion with the patient and his wife today - all questions answered.  Tonny Bollman MD 02/21/2014 12:47 PM

## 2014-03-12 NOTE — Anesthesia Procedure Notes (Signed)
Procedure Name: MAC Date/Time: 03/12/2014 7:30 AM Performed by: Charm Barges, Shreeya Recendiz R Pre-anesthesia Checklist: Patient identified, Emergency Drugs available, Suction available, Patient being monitored and Timeout performed Patient Re-evaluated:Patient Re-evaluated prior to inductionOxygen Delivery Method: Nasal cannula Placement Confirmation: positive ETCO2 Dental Injury: Teeth and Oropharynx as per pre-operative assessment

## 2014-03-12 NOTE — Progress Notes (Signed)
Pt with gauze in mouth from OR

## 2014-03-12 NOTE — Anesthesia Postprocedure Evaluation (Signed)
Anesthesia Post Note  Patient: [name]  Procedure(s) Performed: Procedure(s) (LRB): MULTIPLE EXTRACTION OF TOOTH #'S 2,6,7,8,9,10,11,14,18,20,29,31 WITH ALVEOLOPLASTY AND GROSS DEBRIDMENT OF REMAINING TEETH (N/A)  Anesthesia type: general  Patient location: PACU  Post pain: Pain level controlled  Post assessment: Patient's Cardiovascular Status Stable  Last Vitals:  Filed Vitals:   03/12/14 1019  BP: 137/70  Pulse:   Temp:   Resp:     Post vital signs: Reviewed and stable  Level of consciousness: sedated  Complications: No apparent anesthesia complications

## 2014-03-12 NOTE — Discharge Instructions (Signed)
What to Eat after Tooth extraction:     For your first meals, you should eat lightly; only small meals at first.   Avoid Sharp, Crunchy, and Hot foods.   If you do not have nausea, you may eat larger meals.  Avoid spicy, greasy and heavy food, as these may make you sick after the anesthesia.   Sinus precautions after surgery:  1. Avoid blowing your nose.  2. Avoid sneezing. If you might sneeze, Keep her mouth open and do not pinch your nose closed.  3. Avoid sucking on straws. Avoid smoking.  4. Do not lift objects weighing more than 20 pounds  Call if questions arise.   MOUTH CARE AFTER SURGERY  FACTS:  Ice used in ice bag helps keep the swelling down, and can help lessen the pain.  It is easier to treat pain BEFORE it happens.  Spitting disturbs the clot and may cause bleeding to start again, or to get worse.  Smoking delays healing and can cause complications.  Sharing prescriptions can be dangerous. Do not take medications not recently prescribed for you.  Antibiotics may stop birth control pills from working. Use other means of birth control while on antibiotics.  Warm salt water rinses after the first 24 hours will help lessen the swelling: Use 1/2 teaspoonful of table salt per oz.of water. DO NOT:  Do not spit. Do not drink through a straw.  Strongly advised not to smoke, dip snuff or chew tobacco at least for 3 days.  Do not eat sharp or crunchy foods. Avoid the area of surgery when chewing.  Do not stop your antibiotics before your instructions say to do so.  Do not eat hot foods until bleeding has stopped. If you need to, let your food cool down to room temperature. EXPECT:  Some swelling, especially first 2-3 days.  Soreness or discomfort in varying degrees. Follow your dentist's instructions about how to handle pain before it starts.  Pinkish saliva or light blood in saliva, or on your pillow in the morning. This can last around 24 hours.  Bruising inside or outside the  mouth. This may not show up until 2-3 days after surgery. Don't worry, it will go away in time.  Pieces of "bone" may work themselves loose. It's OK. If they bother you, let us know. WHAT TO DO IMMEDIATELY AFTER SURGERY:  Bite on the gauze with steady pressure for 1-2 hours. Don't chew on the gauze.  Do not lie down flat. Raise your head support especially for the first 24 hours.  Apply ice to your face on the side of the surgery. You may apply it 20 minutes on and a few minutes off. Ice for 8-12 hours. You may use ice up to 24 hours.  Before the numbness wears off, take a pain pill as instructed.  Prescription pain medication is not always required. SWELLING:  Expect swelling for the first couple of days. It should get better after that.  If swelling increases 3 days or so after surgery; let us know as soon as possible. FEVER:  Take Tylenol every 4 hours if needed to lower your temperature, especially if it is at 100F or higher.  Drink lots of fluids.  If the fever does not go away, let us know. BREATHING TROUBLE:  Any unusual difficulty breathing means you have to have someone bring you to the emergency room ASAP BLEEDING:  Light oozing is expected for 24 hours or so.  Prop head up with  pillows  Avoid spitting  Do not confuse bright red fresh flowing blood with lots of saliva colored with a little bit of blood.  If you notice some bleeding, place gauze or a tea bag where it is bleeding and apply CONSTANT pressure by biting down for 1 hour. Avoid talking during this time. Do not remove the gauze or tea bag during this hour to "check" the bleeding.  If you notice bright RED bleeding FLOWING out of particular area, and filling the floor of your mouth, put a wad of gauze on that area, bite down firmly and constantly. Call us immediately. If we're closed, have someone bring you to the emergency room. ORAL HYGIENE:  Brush your teeth as usual after meals and before bedtime.  Use a soft  toothbrush around the area of surgery.  DO NOT AVOID BRUSHING. Otherwise bacteria(germs) will grow and may delay healing or encourage infection.  Since you cannot spit, just gently rinse and let the water flow out of your mouth.  DO NOT SWISH HARD. EATING:  Cool liquids are a good point to start. Increase to soft foods as tolerated. PRESCRIPTIONS:  Follow the directions for your prescriptions exactly as written.  If your doctor gave you a narcotic pain medication, do not drive, operate machinery or drink alcohol when on that medication. QUESTIONS:  Call our office during office hours  MOUTH CARE AFTER SURGERY  FACTS:  Ice used in ice bag helps keep the swelling down, and can help lessen the pain.  It is easier to treat pain BEFORE it happens.  Spitting disturbs the clot and may cause bleeding to start again, or to get worse.  Smoking delays healing and can cause complications.  Sharing prescriptions can be dangerous.  Do not take medications not recently prescribed for you.  Antibiotics may stop birth control pills from working.  Use other means of birth control while on antibiotics.  Warm salt water rinses after the first 24 hours will help lessen the swelling:  Use 1/2 teaspoonful of table salt per oz.of water.  DO NOT:  Do not spit.  Do not drink through a straw.  Strongly advised not to smoke, dip snuff or chew tobacco at least for 3 days.  Do not eat sharp or crunchy foods.  Avoid the area of surgery when chewing.  Do not stop your antibiotics before your instructions say to do so.  Do not eat hot foods until bleeding has stopped.  If you need to, let your food cool down to room temperature.  EXPECT:  Some swelling, especially first 2-3 days.  Soreness or discomfort in varying degrees.  Follow your dentist's instructions about how to handle pain before it starts.  Pinkish saliva or light blood in saliva, or on your pillow in the morning.  This can last around 24  hours.  Bruising inside or outside the mouth.  This may not show up until 2-3 days after surgery.  Don't worry, it will go away in time.  Pieces of "bone" may work themselves loose.  It's OK.  If they bother you, let us know.  WHAT TO DO IMMEDIATELY AFTER SURGERY:  Bite on the gauze with steady pressure for 1-2 hours.  Don't chew on the gauze.  Do not lie down flat.  Raise your head support especially for the first 24 hours.  Apply ice to your face on the side of the surgery.  You may apply it 20 minutes on and a few minutes off.  Ice for 8-12 hours.  You may use ice up to 24 hours.  Before the numbness wears off, take a pain pill as instructed.  Prescription pain medication is not always required.  SWELLING:  Expect swelling for the first couple of days.  It should get better after that.  If swelling increases 3 days or so after surgery; let us know as soon as possible.  FEVER:  Take Tylenol every 4 hours if needed to lower your temperature, especially if it is at 100F or higher.  Drink lots of fluids.  If the fever does not go away, let us know.  BREATHING TROUBLE:  Any unusual difficulty breathing means you have to have someone bring you to the emergency room ASAP  BLEEDING:  Light oozing is expected for 24 hours or so.  Prop head up with pillows  Avoid spitting  Do not confuse bright red fresh flowing blood with lots of saliva colored with a little bit of blood.  If you notice some bleeding, place gauze or a tea bag where it is bleeding and apply CONSTANT pressure by biting down for 1 hour.  Avoid talking during this time.  Do not remove the gauze or tea bag during this hour to "check" the bleeding.  If you notice bright RED bleeding FLOWING out of particular area, and filling the floor of your mouth, put a wad of gauze on that area, bite down firmly and constantly.  Call us immediately.  If we're closed, have someone bring you to the emergency room.  ORAL  HYGIENE:  Brush your teeth as usual after meals and before bedtime.  Use a soft toothbrush around the area of surgery.  DO NOT AVOID BRUSHING.  Otherwise bacteria(germs) will grow and may delay healing or encourage infection.  Since you cannot spit, just gently rinse and let the water flow out of your mouth.  DO NOT SWISH HARD.  EATING:  Cool liquids are a good point to start.  Increase to soft foods as tolerated.  PRESCRIPTIONS:  Follow the directions for your prescriptions exactly as written.  If Dr. Kristin Bruins gave you a narcotic pain medication, do not drive, operate machinery or drink alcohol when on that medication.  QUESTIONS:  Call our office during office hours [phone] or call the Emergency Room at [phone].

## 2014-03-13 ENCOUNTER — Encounter (HOSPITAL_COMMUNITY): Payer: Self-pay | Admitting: Dentistry

## 2014-03-14 ENCOUNTER — Other Ambulatory Visit: Payer: Self-pay | Admitting: *Deleted

## 2014-03-14 DIAGNOSIS — I350 Nonrheumatic aortic (valve) stenosis: Secondary | ICD-10-CM

## 2014-03-20 ENCOUNTER — Encounter (HOSPITAL_COMMUNITY): Payer: Self-pay | Admitting: Dentistry

## 2014-03-20 ENCOUNTER — Ambulatory Visit (HOSPITAL_COMMUNITY): Payer: Self-pay | Admitting: Dentistry

## 2014-03-20 VITALS — BP 132/67 | HR 95 | Temp 98.5°F

## 2014-03-20 DIAGNOSIS — K08403 Partial loss of teeth, unspecified cause, class III: Secondary | ICD-10-CM

## 2014-03-20 DIAGNOSIS — K08109 Complete loss of teeth, unspecified cause, unspecified class: Secondary | ICD-10-CM

## 2014-03-20 DIAGNOSIS — Z0189 Encounter for other specified special examinations: Secondary | ICD-10-CM

## 2014-03-20 DIAGNOSIS — I350 Nonrheumatic aortic (valve) stenosis: Secondary | ICD-10-CM

## 2014-03-20 MED ORDER — CHLORHEXIDINE GLUCONATE 0.12 % MT SOLN: OROMUCOSAL | Status: DC

## 2014-03-20 NOTE — Progress Notes (Signed)
POST OPERATIVE NOTE:  03/20/2014 [name] [id number]  VITALS: BP 132/67 mmHg  Pulse 95  Temp(Src) 98.5 F (36.9 C) (Oral)  LABS:  Lab Results  Component Value Date   WBC 4.9 03/12/2014   HGB 10.9* 03/12/2014   HCT 35.8* 03/12/2014   MCV 90.4 03/12/2014   PLT 94* 03/12/2014   BMET    Component Value Date/Time   NA 139 03/12/2014 0655   K 3.5* 03/12/2014 0655   CL 100 03/12/2014 0655   CO2 26 03/12/2014 0655   GLUCOSE 87 03/12/2014 0655   BUN 18 03/12/2014 0655   CREATININE 0.80 03/12/2014 0655   CALCIUM 9.1 03/12/2014 0655   GFRNONAA 89* 03/12/2014 0655   GFRAA >90 03/12/2014 0655    Lab Results  Component Value Date   INR 1.22 03/12/2014   INR 1.19 02/01/2014   INR 0.98 07/25/2009   No results found for: PTT   LAMARR FEENSTRA is status post multiple extractions with alveoloplasty and gross debridement remaining dentition the operating room on 03/12/2014.  SUBJECTIVE: Patient with minimal complaints but indicates that the extraction sites are" sore". Patient is using Percocet 5/325 for pain medication as needed. Patient is using salt water rinses as needed.  EXAM: There is no sign of infection, heme, or ooze. Sutures are loosely intact. Patient  healing in by generalized primary closure. Patient is healing in by secondary intention involving the upper right molar, lower left premolar, and lower right premolar extraction sites.  PROCEDURE: The patient was given a chlorhexidine gluconate rinse for 30 seconds. Sutures were then removed without complication. Patient tolerated the procedure well.  ASSESSMENT: Post operative course is consistent with dental procedures performed in the Operating room.[name]  PLAN: 1. Patient is to use chlorhexidine gluconate rinses twice daily after breakfast and at bedtime. A prescription for chlorhexidine was sent to his  Pharmacy in [facility].  Patient was dispensed 480 ML's with when necessary refills. 2.  Patient to use salt water rinses every 2 hours in between the chlorhexidine rinses. 3. Patient to brush after meals and at bedtime. Floss at bedtime. 4. Patient to follow-up with his primary dentist for continued dental care to include continued periodontal therapy, dental restorations, endodontic referral, crown and bridge therapy, and evaluation for replacement of indicated teeth once medically stable.  5. Patient to use antibiotic premedication prior to invasive dental procedures due to the anticipated aortic valve replacement. 6. Patient to call problems arise with healing.  [name], DDS

## 2014-03-20 NOTE — Patient Instructions (Addendum)
PLAN: 1. Patient is to use chlorhexidine gluconate rinses twice daily after breakfast and at bedtime. A prescription for chlorhexidine was sent to his  Pharmacy in [facility].  Patient was dispensed 480 ML's with when necessary refills. 2. Patient to use salt water rinses every 2 hours in between the chlorhexidine rinses. 3. Patient to brush after meals and at bedtime. Floss at bedtime. 4. Patient to follow-up with his primary dentist for continued dental care to include continued periodontal therapy, dental restorations, crown and bridge therapy, and evaluation for replacement of indicated teeth once medically stable. 5. 5. Patient to use antibiotic premedication prior to invasive dental procedures due to the anticipated aortic valve replacement. 6. Patient to call problems arise with healing.  Charlynne Pander, DDS

## 2014-03-21 ENCOUNTER — Ambulatory Visit (HOSPITAL_COMMUNITY): Payer: Self-pay | Admitting: Dentistry

## 2014-04-01 ENCOUNTER — Encounter (HOSPITAL_COMMUNITY)
Admission: RE | Admit: 2014-04-01 | Discharge: 2014-04-01 | Disposition: A | Discharge status: Home / Self Care | Payer: Medicare Other | Source: Ambulatory Visit | Attending: Thoracic Surgery (Cardiothoracic Vascular Surgery) | Admitting: Thoracic Surgery (Cardiothoracic Vascular Surgery)

## 2014-04-01 ENCOUNTER — Ambulatory Visit (INDEPENDENT_AMBULATORY_CARE_PROVIDER_SITE_OTHER): Payer: Medicare Other | Admitting: Thoracic Surgery (Cardiothoracic Vascular Surgery)

## 2014-04-01 ENCOUNTER — Ambulatory Visit (HOSPITAL_COMMUNITY)
Admission: RE | Admit: 2014-04-01 | Discharge: 2014-04-01 | Disposition: A | Discharge status: Home / Self Care | Payer: Medicare Other | Source: Ambulatory Visit | Attending: Thoracic Surgery (Cardiothoracic Vascular Surgery) | Admitting: Thoracic Surgery (Cardiothoracic Vascular Surgery)

## 2014-04-01 ENCOUNTER — Encounter (HOSPITAL_COMMUNITY): Payer: Self-pay

## 2014-04-01 ENCOUNTER — Encounter: Payer: Self-pay | Admitting: Thoracic Surgery (Cardiothoracic Vascular Surgery)

## 2014-04-01 ENCOUNTER — Encounter (HOSPITAL_COMMUNITY): Payer: Self-pay | Admitting: Thoracic Surgery (Cardiothoracic Vascular Surgery)

## 2014-04-01 VITALS — BP 100/46 | HR 100 | Temp 98.9°F | Resp 18 | Ht 72.0 in | Wt 282.2 lb

## 2014-04-01 VITALS — BP 124/72 | HR 92 | Resp 20 | Ht 71.0 in | Wt 283.0 lb

## 2014-04-01 DIAGNOSIS — J984 Other disorders of lung: Secondary | ICD-10-CM | POA: Insufficient documentation

## 2014-04-01 DIAGNOSIS — I4892 Unspecified atrial flutter: Secondary | ICD-10-CM | POA: Insufficient documentation

## 2014-04-01 DIAGNOSIS — M5134 Other intervertebral disc degeneration, thoracic region: Secondary | ICD-10-CM

## 2014-04-01 DIAGNOSIS — K7460 Unspecified cirrhosis of liver: Secondary | ICD-10-CM

## 2014-04-01 DIAGNOSIS — I350 Nonrheumatic aortic (valve) stenosis: Secondary | ICD-10-CM | POA: Insufficient documentation

## 2014-04-01 DIAGNOSIS — Z951 Presence of aortocoronary bypass graft: Secondary | ICD-10-CM

## 2014-04-01 DIAGNOSIS — Z87891 Personal history of nicotine dependence: Secondary | ICD-10-CM

## 2014-04-01 DIAGNOSIS — Z981 Arthrodesis status: Secondary | ICD-10-CM | POA: Insufficient documentation

## 2014-04-01 DIAGNOSIS — I4430 Unspecified atrioventricular block: Secondary | ICD-10-CM

## 2014-04-01 DIAGNOSIS — Z952 Presence of prosthetic heart valve: Secondary | ICD-10-CM

## 2014-04-01 DIAGNOSIS — E109 Type 1 diabetes mellitus without complications: Secondary | ICD-10-CM

## 2014-04-01 DIAGNOSIS — I4510 Unspecified right bundle-branch block: Secondary | ICD-10-CM

## 2014-04-01 DIAGNOSIS — Z79899 Other long term (current) drug therapy: Secondary | ICD-10-CM | POA: Insufficient documentation

## 2014-04-01 DIAGNOSIS — Z794 Long term (current) use of insulin: Secondary | ICD-10-CM

## 2014-04-01 DIAGNOSIS — I25810 Atherosclerosis of coronary artery bypass graft(s) without angina pectoris: Secondary | ICD-10-CM

## 2014-04-01 DIAGNOSIS — Z01818 Encounter for other preprocedural examination: Secondary | ICD-10-CM | POA: Insufficient documentation

## 2014-04-01 HISTORY — DX: Cardiac arrhythmia, unspecified: I49.9

## 2014-04-01 HISTORY — DX: Anemia, unspecified: D64.9

## 2014-04-01 HISTORY — DX: Dizziness and giddiness: R42

## 2014-04-01 HISTORY — DX: Pneumonia, unspecified organism: J18.9

## 2014-04-01 HISTORY — DX: Cardiac murmur, unspecified: R01.1

## 2014-04-01 HISTORY — DX: Personal history of other medical treatment: Z92.89

## 2014-04-01 HISTORY — DX: Unspecified asthma, uncomplicated: J45.909

## 2014-04-01 LAB — BLOOD GAS, ARTERIAL
Acid-Base Excess: 2.2 mmol/L — ABNORMAL HIGH (ref 0.0–2.0)
BICARBONATE: 26 meq/L — AB (ref 20.0–24.0)
Drawn by: 206361
FIO2: 0.21 %
O2 SAT: 92.1 %
PATIENT TEMPERATURE: 98.6
PH ART: 7.441 (ref 7.350–7.450)
TCO2: 27.2 mmol/L (ref 0–100)
pCO2 arterial: 38.8 mmHg (ref 35.0–45.0)
pO2, Arterial: 63.5 mmHg — ABNORMAL LOW (ref 80.0–100.0)

## 2014-04-01 LAB — COMPREHENSIVE METABOLIC PANEL
ALT: 40 U/L (ref 0–53)
ANION GAP: 16 — AB (ref 5–15)
AST: 72 U/L — ABNORMAL HIGH (ref 0–37)
Albumin: 2.7 g/dL — ABNORMAL LOW (ref 3.5–5.2)
Alkaline Phosphatase: 121 U/L — ABNORMAL HIGH (ref 39–117)
BILIRUBIN TOTAL: 0.5 mg/dL (ref 0.3–1.2)
BUN: 14 mg/dL (ref 6–23)
CO2: 21 mEq/L (ref 19–32)
Calcium: 9.4 mg/dL (ref 8.4–10.5)
Chloride: 100 mEq/L (ref 96–112)
Creatinine, Ser: 0.79 mg/dL (ref 0.50–1.35)
GFR calc Af Amer: >90 mL/min (ref >90)
GFR calc non Af Amer: 90 mL/min — ABNORMAL LOW (ref >90)
GLUCOSE: 44 mg/dL — AB (ref 70–99)
Potassium: 4.2 mEq/L (ref 3.7–5.3)
Sodium: 137 mEq/L (ref 137–147)
TOTAL PROTEIN: 6.7 g/dL (ref 6.0–8.3)

## 2014-04-01 LAB — CBC
HCT: 37.3 % — ABNORMAL LOW (ref 39.0–52.0)
Hemoglobin: 11.9 g/dL — ABNORMAL LOW (ref 13.0–17.0)
MCH: 28.8 pg (ref 26.0–34.0)
MCHC: 31.9 g/dL (ref 30.0–36.0)
MCV: 90.3 fL (ref 78.0–100.0)
Platelets: 109 10*3/uL — ABNORMAL LOW (ref 150–400)
RBC: 4.13 MIL/uL — ABNORMAL LOW (ref 4.22–5.81)
RDW: 18.9 % — ABNORMAL HIGH (ref 11.5–15.5)
WBC: 7.3 10*3/uL (ref 4.0–10.5)

## 2014-04-01 LAB — URINALYSIS, ROUTINE W REFLEX MICROSCOPIC
BILIRUBIN URINE: NEGATIVE
GLUCOSE, UA: NEGATIVE mg/dL
HGB URINE DIPSTICK: NEGATIVE
Ketones, ur: NEGATIVE mg/dL
Leukocytes, UA: NEGATIVE
Nitrite: NEGATIVE
PH: 5.5 (ref 5.0–8.0)
Protein, ur: NEGATIVE mg/dL
SPECIFIC GRAVITY, URINE: 1.012 (ref 1.005–1.030)
Urobilinogen, UA: 1 mg/dL (ref 0.0–1.0)

## 2014-04-01 LAB — TYPE AND SCREEN
ABO/RH(D): O POS
ANTIBODY SCREEN: NEGATIVE

## 2014-04-01 LAB — PROTIME-INR
INR: 1.18 (ref 0.00–1.49)
PROTHROMBIN TIME: 15.1 s (ref 11.6–15.2)

## 2014-04-01 LAB — APTT: aPTT: 34 seconds (ref 24–37)

## 2014-04-01 NOTE — Patient Instructions (Signed)
Stop taking Metformin after today  Continue all other medications through tomorrow  Nothing to eat or drink after midnight tomorrow night  On the morning of surgery take only Cardizem CD, Toprol XL, and your Keflex with a sip of water  Take 1/2 your normal dose of 70/30 insulin on the morning of surgery

## 2014-04-01 NOTE — Progress Notes (Signed)
[address]       [name] [address]             [phone]     CARDIOTHORACIC SURGERY OFFICE NOTE  Referring Provider is Tonny Bollman, MD  Primary Cardiologist is Laqueta Linden, MD PCP is Donzetta Sprung, MD   HPI:  Patient is a 68 year old morbidly obese white male from [place] with complex past medical history and multiple medical problems who returns to the office for follow up of severe symptomatic aortic stenosis with tentative plans to proceed with transcatheter aortic valve replacement later this week.  The patient has been evaluated by a multidisciplinary team of specialists and previously had been scheduled for TAVR last month, but surgery was postponed because of the patient's poor dentition with recent toothache and possible dental abscess. He was referred to Dr. Robin Searing who subsequently performed multiple extractions with alveoloplasty, and the patient has now been cleared for surgery. He reports no new problems or complaints over the past few weeks.  The patient has a long-standing cardiac history dating back to 1998 when he presented with symptoms of chest pain consistent with angina pectoris. He was found to have three-vessel coronary artery disease and he subsequently underwent coronary artery bypass grafting x5 on 03/27/1997. Grafts placed at the time of surgery included left internal mammary artery to distal left anterior descending coronary artery, saphenous vein graft to the diagonal branch, sequential saphenous vein grafts to the first and second obtuse marginal branches of left circumflex coronary artery, and saphenous vein graft to the distal right coronary artery. His postoperative recovery was uncomplicated and he subsequently did well from a cardiac standpoint until 2010. 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 Repeat catheterization performed at that time demonstrated that the majority of his vein grafts remained patent although there was high-grade segmental stenosis in the midportion of the vein graft to right coronary artery. There had been some progression of native coronary artery disease and there was some disease within the terminal branches of the left circumflex system as well. The patient was treated with PCI and stenting of the vein graft to right coronary artery using a bare-metal stent. Since then the patient was followed by Dr Earnestine Leys for several years and remained stable from a cardiac standpoint, although he developed progressive exertional shortness of breath. This was attributed to his underlying severe COPD, for which he is now dependent on oxygen therapy at night. More recently he has been followed by Dr. Purvis Sheffield who noted a systolic murmur on physical exam when he first examined the patient in 2014. Echocardiogram performed at that time demonstrated the presence of moderate aortic stenosis which has progressed in severity on subsequent followup exams. He also developed paroxysmal atrial flutter which has become persistent more recently. He was recently placed on Xarelto for long term anticoagulation. More recently the patient's shortness of breath has continued to get worse, and he was referred to Dr. Excell Seltzer last summer to discuss treatment options for management of severe aortic stenosis. At that time the patient wanted to contemplate matters further before pursuing any further diagnostic workup.  The patient experienced a fairly rapid progression of worsening shortness of breath, increasing lower extremity edema, and weight gain for which he ultimately presented to his primary care physician's office in late September.  He was sent directly  to the emergency department and hospitalized for acute exacerbation of class IV symptoms of chronic diastolic congestive  heart failure. He was noted to be severely anemic and transfused 1 unit packed red blood cells. He was transferred to [facility] where symptoms improved on medical therapy, with a corresponding 30 lb weight loss following transfusion and diuresis.  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  During the course of the patient's subsequent work up for aortic stenosis he has been evaluated comprehensively by a multidisciplinary team of specialists coordinated through the Multidisciplinary Heart Valve Clinic in the [facility].  The patient has been independently evaluated by two cardiac surgeons including myself and Dr. Laneta Simmers, and both of [place] feel that the patient would be a poor candidate for conventional surgery.  Based upon review of all of the patient's preoperative diagnostic tests they are felt to be candidate for transcatheter aortic valve replacement using the transapica approach as an alternative to extremely high risk conventional surgery.    The patient is married and lives with his wife in [place], [place]. 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 He has severe chronic pain in his back and hips which limit his mobility considerably. He can only walk short distances using a cane. He uses oxygen at night to sleep.   Current Outpatient Prescriptions  Medication Sig Dispense Refill  . albuterol-ipratropium (COMBIVENT) 18-103 MCG/ACT inhaler Inhale 2 puffs into the lungs every 6 (six) hours as needed for wheezing or shortness of breath.     Marland Kitchen ascorbic acid (VITAMIN C) 500 MG tablet Take 500 mg by mouth daily.     Marland Kitchen aspirin 81 MG tablet Take 1 tablet (81 mg total) by mouth daily.    . budesonide (PULMICORT) 0.25 MG/2ML nebulizer solution Take 0.25 mg by nebulization 2 (two)  times daily.    . cephALEXin (KEFLEX) 500 MG capsule Take 1,000 mg by mouth 2 (two) times daily. for 10 days    . cetirizine (ZYRTEC) 10 MG tablet Take 10 mg by mouth at bedtime.    . chlorhexidine (PERIDEX) 0.12 % solution Rinse with 15 mls twice daily for 30 seconds. Use after breakfast and at bedtime. Spit out excess. Do not swallow. 480 mL prn  . co-enzyme Q-10 30 MG capsule Take 30 mg by mouth at bedtime.     Marland Kitchen desonide (DESOWEN) 0.05 % cream Apply 1 application topically 2 (two) times daily as needed (for rash).     Marland Kitchen diltiazem (CARDIZEM) 30 MG tablet Take 1 tablet (30 mg total) by mouth 2 (two) times daily. 180 tablet 3  . ezetimibe (ZETIA) 10 MG tablet Take 10 mg by mouth at bedtime.     . formoterol (PERFOROMIST) 20 MCG/2ML nebulizer solution Take 2 mLs (20 mcg total) by nebulization 2 (two) times daily. Use in nebulizer twice daily perfectly regularly 120 mL 11  . furosemide (LASIX) 80 MG tablet Take 80 mg by mouth daily.    . insulin NPH-insulin regular (NOVOLIN 70/30) (70-30) 100 UNIT/ML injection Inject 80 Units into the skin 2 (two) times daily.     Marland Kitchen ipratropium-albuterol (DUONEB) 0.5-2.5 (3) MG/3ML SOLN Take 3 mLs by nebulization every 6 (six) hours as needed (for shortness of breath).    Marland Kitchen ketoconazole (NIZORAL) 2 %  cream Apply 1 application topically 2 (two) times daily as needed for irritation.     Marland Kitchen loratadine (CLARITIN) 10 MG tablet Take 10 mg by mouth every morning.     . metFORMIN (GLUCOPHAGE) 1000 MG tablet Take 1 tablet (1,000 mg total) by mouth 2 (two) times daily. Hold for 48 hours, restart on 02/10/2014.    . metoprolol succinate (TOPROL-XL) 25 MG 24 hr tablet Take 1 tablet (25 mg total) by mouth 2 (two) times daily. 60 tablet 6  . Multiple Vitamin (MULTIVITAMIN) tablet Take 1 tablet by mouth daily.      Marland Kitchen oxyCODONE-acetaminophen (PERCOCET/ROXICET) 5-325 MG per tablet Take 1-2 tablets by mouth every 4 (four) hours as needed for moderate pain. 40 tablet 0  . potassium  chloride SA (KLOR-CON M20) 20 MEQ tablet Take 1 tablet (20 mEq total) by mouth daily. 90 tablet 3  . predniSONE (DELTASONE) 5 MG tablet Take 5 mg by mouth daily with breakfast.     No current facility-administered medications for this visit.      Physical Exam:   BP 124/72 mmHg  Pulse 92  Resp 20  Ht 5\' 11"  (1.803 m)  Wt 283 lb (128.368 kg)  BMI 39.49 kg/m2  SpO2 94%  General:  Obese, in NAD  Chest:   clear  CV:   RRR w/ systolic murmur  Incisions:  n/a  Abdomen:  Soft, obese, non-tender  Extremities:  Warm, well-perfused, 1+/2+ edema  Diagnostic Tests:  n/a   Impression:  The patient has stage D severe symptomatic aortic stenosis with preserved LV systolic function and chronic symptoms of exertional shortness of breath and lower extremity edema consistent with chronic diastolic congestive heart failure, NYHA functional class III. He has recently had an acute exacerbation of stage IV symptoms of CHF associated with occult GI blood loss resulting in need for hospitalization in early October. He improved markedly with diuresis, but he was found to have underlying cirrhosis at that time. He has multiple severe chronic medical problems including morbid obesity, severe COPD, rheumatoid arthritis, cirrhosis with portal hypertension, anemia of undermined origin, persistent atrial flutter, and prior CABG.  The patient has been independently evaluated by two cardiac surgeons including myself and Dr. Laneta Simmers, and both of [place] feel that the patient would be a poor candidate for conventional surgery.  Based upon review of his preoperative diagnostic tests the patient appears to be an acceptable candidate for transcatheter aortic valve replacement using a 29 mm Edwards Sapien XT transcatheter heart valve via transapcial approach.     Plan:  The patient has been counseled extensively as to the relative risks and benefits of all options for the treatment of severe aortic stenosis including long term  medical therapy, conventional surgery for aortic valve replacement, and transcatheter aortic valve replacement.  Following the decision to proceed with transcatheter aortic valve replacement, a discussion has been held regarding what types of management strategies would be attempted intraoperatively in the event of life-threatening complications, including whether or not the patient would be considered a candidate for the use of cardiopulmonary bypass and/or conversion to open sternotomy for attempted surgical intervention.  The patient has been advised of a variety of complications that might develop including but not limited to risks of death, stroke, paravalvular leak, aortic dissection or other major vascular complications, aortic annulus rupture, device embolization, cardiac rupture or perforation, mitral regurgitation, acute myocardial infarction, arrhythmia, heart block or bradycardia requiring permanent pacemaker placement, congestive heart failure, respiratory failure, renal failure, pneumonia,  infection, other late complications related to structural valve deterioration or migration, or other complications that might ultimately cause a temporary or permanent loss of functional independence or other long term morbidity.  The patient provides full informed consent for the procedure as described and all questions were answered.  The patient has been instructed to stop taking metformin but continue all other medications without change tomorrow. On the morning of surgery the patient will take only Cardizem CD, Toprol-XL, and Keflex with a sip of water. On the morning of surgery he will take half his usual dose of long-acting insulin.   I spent in excess of 30 minutes during the conduct of this office consultation and >50% of this time involved direct face-to-face encounter with the patient for counseling and/or coordination of their care.    Salvatore Decent. Cornelius Moras, MD 04/01/2014 5:29 PM

## 2014-04-01 NOTE — Pre-Procedure Instructions (Signed)
[name]  04/01/2014   Your procedure is scheduled on:  Wednesday, December 2nd  Report to [facility] Admitting at 630 AM.  Call this number if you have problems the morning of surgery: [phone]   Remember:   Do not eat food or drink liquids after midnight.   Take these medicines the morning of surgery with A SIP OF WATER: cardizem, toprol, pulmicort, combivent if needed, nebulizer if needed, percocet if needed   Do not wear jewelry.  Do not wear lotions, powders, or perfumes, deodorant.  Do not shave 48 hours prior to surgery. Men may shave face and neck.  Do not bring valuables to the hospital.  [facility] is not responsible  for any belongings or valuables.               Contacts, dentures or bridgework may not be worn into surgery.  Leave suitcase in the car. After surgery it may be brought to your room.  For patients admitted to the hospital, discharge time is determined by your treatment team.         Please read over the following fact sheets that you were given: Pain Booklet, Coughing and Deep Breathing, Blood Transfusion Information, MRSA Information and Surgical Site Infection Prevention  [facility] - Preparing for Surgery  Before surgery, you can play an important role.  Because skin is not sterile, your skin needs to be as free of germs as possible.  You can reduce the number of germs on you skin by washing with CHG (chlorahexidine gluconate) soap before surgery.  CHG is an antiseptic cleaner which kills germs and bonds with the skin to continue killing germs even after washing.  Please DO NOT use if you have an allergy to CHG or antibacterial soaps.  If your skin becomes reddened/irritated stop using the CHG and inform your nurse when you arrive at Short Stay.  Do not shave (including legs and underarms) for at least 48 hours prior to the first CHG shower.  You may shave your face.  Please follow these instructions carefully:   1.  Shower with  CHG Soap the night before surgery and the morning of Surgery.  2.  If you choose to wash your hair, wash your hair first as usual with your normal shampoo.  3.  After you shampoo, rinse your hair and body thoroughly to remove the shampoo.  4.  Use CHG as you would any other liquid soap.  You can apply CHG directly to the skin and wash gently with scrungie or a clean washcloth.  5.  Apply the CHG Soap to your body ONLY FROM THE NECK DOWN.  Do not use on open wounds or open sores.  Avoid contact with your eyes, ears, mouth and genitals (private parts).  Wash genitals (private parts) with your normal soap.  6.  Wash thoroughly, paying special attention to the area where your surgery will be performed.  7.  Thoroughly rinse your body with warm water from the neck down.  8.  DO NOT shower/wash with your normal soap after using and rinsing off the CHG Soap.  9.  Pat yourself dry with a clean towel.            10.  Wear clean pajamas.            11.  Place clean sheets on your bed the night of your first shower and do not sleep with pets.  Day of Surgery  Do not apply any lotions/deoderants the morning of surgery.  Please wear clean clothes to the hospital/surgery center.

## 2014-04-01 NOTE — Progress Notes (Signed)
   04/01/14 1258  OBSTRUCTIVE SLEEP APNEA  Have you ever been diagnosed with sleep apnea through a sleep study? No  Do you snore loudly (loud enough to be heard through closed doors)?  0  Do you often feel tired, fatigued, or sleepy during the daytime? 0  Has anyone observed you stop breathing during your sleep? 0  Do you have, or are you being treated for high blood pressure? 1  BMI more than 35 kg/m2? 1  Age over 69 years old? 1  Neck circumference greater than 40 cm/16 inches? 1  Gender: 1  Obstructive Sleep Apnea Score 5  Score 4 or greater  Results sent to PCP

## 2014-04-01 NOTE — H&P (Addendum)
[address]       [name] [address]             [phone]          CARDIOTHORACIC SURGERY HISTORY AND PHYSICAL EXAM  Referring Provider is Tonny Bollman, MD  Primary Cardiologist is Laqueta Linden, MD PCP is Donzetta Sprung, MD  HPI:  Patient is a 68 year old morbidly obese white male from [place] with complex past medical history and multiple medical problems who returns to the office for follow up of severe symptomatic aortic stenosis with tentative plans to proceed with transcatheter aortic valve replacement later this week. The patient has been evaluated by a multidisciplinary team of specialists and previously had been scheduled for TAVR last month, but surgery was postponed because of the patient's poor dentition with recent toothache and possible dental abscess. He was referred to Dr. Robin Searing who subsequently performed multiple dental extractions with alveoloplasty, and the patient has now been cleared for surgery. He reports no new problems or complaints over the past few weeks.  The patient has a long-standing cardiac history dating back to 1998 when he presented with symptoms of chest pain consistent with angina pectoris. He was found to have three-vessel coronary artery disease and he subsequently underwent coronary artery bypass grafting x5 on 03/27/1997. Grafts placed at the time of surgery included left internal mammary artery to distal left anterior descending coronary artery, saphenous vein graft to the diagonal branch, sequential saphenous vein grafts to the first and second obtuse marginal branches of left circumflex coronary artery, and saphenous vein graft to the distal right coronary artery. His postoperative recovery was uncomplicated and he subsequently did well from a cardiac standpoint until 2010. 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 Repeat catheterization performed at that time demonstrated that the majority of his vein grafts remained patent although there was high-grade segmental stenosis in the midportion of the vein graft to right coronary artery. There had been some progression of native coronary artery disease and there was some disease within the terminal branches of the left circumflex system as well. The patient was treated with PCI and stenting of the vein graft to right coronary artery using a bare-metal stent. Since then the patient was followed by Dr Earnestine Leys for several years and remained stable from a cardiac standpoint, although he developed progressive exertional shortness of breath. This was attributed to his underlying severe COPD, for which he is now dependent on oxygen therapy at night. More recently he has been followed by Dr. Purvis Sheffield who noted a systolic murmur on physical exam when he first examined the patient in 2014. Echocardiogram performed at that time demonstrated the presence of moderate aortic stenosis which has progressed in severity on subsequent followup exams. He also developed paroxysmal atrial flutter which has become persistent more recently. He was recently placed on Xarelto for long term anticoagulation. More recently the patient's shortness of breath has continued to get worse, and he was referred to Dr. Excell Seltzer last summer to discuss treatment options for management of severe aortic stenosis. At that time the patient wanted to contemplate matters further before pursuing any further diagnostic workup.  Over the next few months the patient experienced a fairly rapid progression of worsening shortness of breath, increasing lower extremity edema, and weight gain for which he ultimately presented to his  primary care physician's office in late September. He was sent directly to the emergency department and hospitalized for acute exacerbation of  class IV symptoms of congestive heart failure. He was noted to be severely anemic and transfused 1 unit packed red blood cells. He was transferred to [facility] where symptoms improved on medical therapy, with a corresponding 30 lb weight loss following transfusion and diuresis. 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 The patient's hemoglobin has remained stable with no clinical signs of further blood loss.  His heart failure has remained stable on medical therapy, although he still gets short of breath with relatively mild physical activity.  The patient is married and lives with his wife in [place], [place]. 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 He has severe chronic pain in his back and hips which limit his mobility considerably. He can only walk short distances using a cane. He uses oxygen at night to sleep.  During the course of the patient's subsequent work up for aortic stenosis he has been evaluated comprehensively by a multidisciplinary team of specialists coordinated through the [facility] in the [facility]. The patient has been independently evaluated by two cardiac surgeons including myself and Dr. Laneta Simmers, and both of [place] feel that the patient would be a poor candidate for conventional surgery. Based upon review of all of the patient's preoperative diagnostic tests they are felt to be candidate for transcatheter aortic valve replacement as an alternative to extremely high risk conventional surgery.     Past Medical History  Diagnosis Date  . Rheumatoid arthritis(714.0)     chronic Prednisone  . COPD (chronic obstructive pulmonary disease)   . Elevated liver function tests     . Hypertension   . Aortic stenosis     a. 2D ECHO: 01/30/2014: ef 50-55%. No RWMAs. Severe VP:XTGGY area (VTI): 0.82 cm^2. Valve area (Vmax): 0.74 cm^2. Valve area (Vmean): 0.75 Cm^2. Mod LA/RA dilation. PA peak pressure 33  . CAD (coronary artery disease)   . GERD (gastroesophageal reflux disease)   . Chronic atrial flutter   . 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 Degenerative disc disease, cervical   . Degenerative disc disease, lumbar 2010  . Morbid obesity   . Coronary atherosclerosis of artery bypass graft 2010  . Acute on chronic diastolic heart failure 02/01/2014  . Chronic diastolic congestive heart failure 02/01/2014  . Epistaxis     Developed on Xarelto therapy  . Cirrhosis 02/01/2014  . Portal hypertension 02/01/2014  . Chronic pancreatitis 02/01/2014    Noted on CT scan  . Shortness of breath   . Dizziness   . Heart murmur   . Asthma     uses nebulizers daily  . Diabetes mellitus     fasting blood glucose 120-130  . Pneumonia     hx of  . Dysrhythmia     fib/flutter  . Anemia     was on xalerto sept 2015 for afib - was taken off of - had to get unit of prbc  . History of blood  transfusion     Past Surgical History  Procedure Laterality Date  . Coronary stent placement  01/02/2009    s/p bare metal stent to the vein graft to the RCA 9/20. nml LV function   . Appendectomy    . Coronary artery bypass graft  03/27/1997    CABG x 5 by Dr Cornelius Moras  . Anterior cervical decompression      with posterior decompression and fusion   . Tonsillectomy and adenoidectomy    . Lumbar spine surgery    . Esophagogastroduodenoscopy (egd) with propofol N/A 02/01/2014    Procedure: ESOPHAGOGASTRODUODENOSCOPY (EGD) WITH PROPOFOL;  Surgeon: Shirley Friar, MD;  Location: [facility] ENDOSCOPY;  Service: Endoscopy;  Laterality: N/A;  . Colonoscopy with propofol N/A 02/01/2014    Procedure: COLONOSCOPY  WITH PROPOFOL;  Surgeon: Shirley Friar, MD;  Location: [facility] ENDOSCOPY;  Service: Endoscopy;  Laterality: N/A;  . Multiple extractions with alveoloplasty N/A 03/12/2014    Procedure: MULTIPLE EXTRACTION OF TOOTH #'S 2,6,7,8,9,10,11,14,18,20,29,31 WITH ALVEOLOPLASTY AND GROSS DEBRIDMENT OF REMAINING TEETH;  Surgeon: Charlynne Pander, DDS;  Location: [facility] OR;  Service: Oral Surgery;  Laterality: N/A;  . Coronary angioplasty      1 stent    Family History  Problem Relation Age of Onset  . Hypertension Mother   . Cancer Father   . Stroke Brother   . Liver cancer Paternal Grandfather   . Rheum arthritis Maternal Aunt   . Asthma Child   . Emphysema Brother     smoked    Social History History  Substance Use Topics  . Smoking status: Former Smoker -- 3.00 packs/day for 35 years    Types: Cigarettes    Start date: 09/15/1961    Quit date: 11/23/1996  . Smokeless tobacco: Current User    Types: Chew     Comment: chews 2-3 packs per week  . Alcohol Use: No    Prior to Admission medications   Medication Sig Start Date End Date Taking? Authorizing Provider  albuterol-ipratropium (COMBIVENT) 18-103 MCG/ACT inhaler Inhale 2 puffs into the lungs every 6 (six) hours as needed for wheezing or shortness of breath.    Yes Historical Provider, MD  ascorbic acid (VITAMIN C) 500 MG tablet Take 500 mg by mouth daily.    Yes Historical Provider, MD  aspirin 81 MG tablet Take 1 tablet (81 mg total) by mouth daily. 02/07/14  Yes Rhonda G Barrett, PA-C  budesonide (PULMICORT) 0.25 MG/2ML nebulizer solution Take 0.25 mg by nebulization 2 (two) times daily.   Yes Historical Provider, MD  cephALEXin (KEFLEX) 500 MG capsule Take 1,000 mg by mouth 2 (two) times daily. for 10 days 02/27/14  Yes Historical Provider, MD  cetirizine (ZYRTEC) 10 MG tablet Take 10 mg by mouth at bedtime.   Yes Historical Provider, MD  chlorhexidine (PERIDEX) 0.12 % solution Rinse with 15 mls twice daily for 30 seconds. Use after  breakfast and at bedtime. Spit out excess. Do not swallow. 03/20/14  Yes Charlynne Pander, DDS  co-enzyme Q-10 30 MG capsule Take 30 mg by mouth at bedtime.    Yes Historical Provider, MD  desonide (DESOWEN) 0.05 % cream Apply 1 application topically 2 (two) times daily as needed (for rash).    Yes Historical Provider, MD  diltiazem (CARDIZEM) 30 MG tablet Take 1 tablet (30 mg total) by mouth 2 (two) times daily. 10/09/13  Yes Laqueta Linden, MD  ezetimibe (ZETIA) 10 MG tablet Take 10 mg by mouth at bedtime.  Yes Historical Provider, MD  formoterol (PERFOROMIST) 20 MCG/2ML nebulizer solution Take 2 mLs (20 mcg total) by nebulization 2 (two) times daily. Use in nebulizer twice daily perfectly regularly 03/21/13  Yes Nyoka Cowden, MD  furosemide (LASIX) 80 MG tablet Take 80 mg by mouth daily.   Yes Historical Provider, MD  insulin NPH-insulin regular (NOVOLIN 70/30) (70-30) 100 UNIT/ML injection Inject 80 Units into the skin 2 (two) times daily.    Yes Historical Provider, MD  ipratropium-albuterol (DUONEB) 0.5-2.5 (3) MG/3ML SOLN Take 3 mLs by nebulization every 6 (six) hours as needed (for shortness of breath).   Yes Historical Provider, MD  ketoconazole (NIZORAL) 2 % cream Apply 1 application topically 2 (two) times daily as needed for irritation.    Yes Historical Provider, MD  loratadine (CLARITIN) 10 MG tablet Take 10 mg by mouth every morning.    Yes Historical Provider, MD  metFORMIN (GLUCOPHAGE) 1000 MG tablet Take 1 tablet (1,000 mg total) by mouth 2 (two) times daily. Hold for 48 hours, restart on 02/10/2014. 02/07/14  Yes Rhonda G Barrett, PA-C  metoprolol succinate (TOPROL-XL) 25 MG 24 hr tablet Take 1 tablet (25 mg total) by mouth 2 (two) times daily. 02/07/14  Yes Ok Anis, NP  Multiple Vitamin (MULTIVITAMIN) tablet Take 1 tablet by mouth daily.     Yes Historical Provider, MD  oxyCODONE-acetaminophen (PERCOCET/ROXICET) 5-325 MG per tablet Take 1-2 tablets by mouth every 4  (four) hours as needed for moderate pain. 03/12/14  Yes Charlynne Pander, DDS  potassium chloride SA (KLOR-CON M20) 20 MEQ tablet Take 1 tablet (20 mEq total) by mouth daily. 10/09/13  Yes Laqueta Linden, MD  predniSONE (DELTASONE) 5 MG tablet Take 5 mg by mouth daily with breakfast.   Yes Historical Provider, MD    No Known Allergies     Review of Systems:  General:normal appetite, normal energy, + weight gain, no weight loss, no fever Cardiac:no chest pain with exertion, no chest pain at rest, + SOB with exertion, + resting SOB, no PND, + orthopnea, no palpitations, + arrhythmia, + atrial fibrillation, + LE edema, no dizzy spells, no syncope Respiratory:+ shortness of breath, + home oxygen, intermittent chronic productive cough, + dry cough, no bronchitis, + wheezing, no hemoptysis, no asthma, no pain with inspiration or cough, no sleep apnea, no CPAP at night GI:no difficulty swallowing, no reflux, no frequent heartburn, no hiatal hernia, no abdominal pain, no constipation, no diarrhea, no hematochezia, no hematemesis, no melena GU:no dysuria, no frequency, no urinary tract infection, no hematuria, no enlarged prostate, no kidney stones, no kidney disease Vascular:no pain suggestive of claudication, no pain in feet, no leg cramps, no varicose veins, no DVT, no non-healing foot ulcer Neuro:no stroke, no TIA's, no seizures, no headaches, no temporary blindness one eye, no slurred speech, no peripheral neuropathy, + chronic pain, + instability of gait, no memory/cognitive dysfunction Musculoskeletal:+ severe arthritis, + joint swelling, no myalgias, + difficulty walking, limited mobility   Skin:no rash, no itching, no skin infections, no pressure sores or ulcerations Psych:no anxiety, no depression, no nervousness, no unusual recent stress Eyes:no blurry vision, no floaters, no recent vision changes, + wears glasses or contacts ENT:no hearing loss, + loose or painful teeth, no dentures, last saw dentist recently in [place] system Hematologic:no easy bruising, + abnormal bleeding, no clotting disorder, + recent frequent epistaxis since starting Xarelto Endocrine:+ diabetes, does not check CBG's at home regularly   Physical Exam:  BP 101/57  Pulse  47  Temp(Src) 97.9 F (36.6 C) (Oral)  Resp 19  Ht 5\' 11"  (1.803 m)  Wt 137.168 kg (302 lb 6.4 oz)  BMI 42.20 kg/m2  SpO2 95% General:Morbidly obese, chronically ill-appearing HEENT:Unremarkable except for poor dentition Neck:no JVD, no bruits, no adenopathy  Chest:clear to auscultation, symmetrical breath sounds, no wheezes, no rhonchi  CV:RRR, grade III/VI crescendo/decrescendo systolic murmur  Abdomen:soft, non-tender, no masses  Extremities:warm, well-perfused, pulses not palpable, + severe bilateral lower extremity edema Rectal/GUDeferred Neuro:Grossly non-focal and symmetrical throughout Skin:Clean and dry, no rashes, no breakdown  Diagnostic Tests:   Transthoracic Echocardiography  Patient: Merland, Holness MR #:  [id number] Study Date: 02/15/2013 Gender: M Age: 9 Height: 180.3cm Weight: 129.3kg BSA: 2.72m^2 Pt. Status: Room:  SONOGRAPHER Missy Al-Rammal, RVT, RDCS PERFORMING [place]oley, [place] ATTENDING GRIGADALE, MD ORDERING Prentice Docker, MD REFERRING Prentice Docker, MD cc:  ------------------------------------------------------------ LV EF: 60% - 65%  ------------------------------------------------------------ History: PMH: Acquired from the patient and from the patient's chart. Dyspnea, exertional dyspnea, and systolic murmur. Coronary artery disease. Chronic obstructive pulmonary disease. Risk factors: Hypertension.  ------------------------------------------------------------ Study Conclusions  - Left ventricle: The cavity size was normal. There was mild concentric hypertrophy. Systolic function was normal. The estimated ejection fraction was in the range of 60% to 65%. Wall motion was normal; there were no regional wall motion abnormalities. There was an increased relative contribution of atrial contraction to ventricular filling. Doppler parameters are consistent with abnormal left ventricular relaxation (grade 1 diastolic dysfunction). - Aortic valve: Trileaflet; moderately thickened, moderately calcified leaflets. Valve mobility was restricted. There wasmoderate stenosis. Peak velocity: 375cm/s (S). Mean gradient: 33mm Hg (S). Valve area: 0.9cm^2(VTI). Valve area: 0.83cm^2 (Vmax). - Left atrium: The atrium was mildly dilated.  ------------------------------------------------------------ Labs, prior tests, procedures, and surgery: Coronary artery bypass grafting.  Transthoracic echocardiography. M-mode, complete 2D, spectral Doppler, and color Doppler. Height: Height: 180.3cm. Height: 71in. Weight: Weight: 129.3kg. Weight: 284.4lb. Body mass index: BMI: 39.7kg/m^2. Body surface area: BSA: 2.60m^2. Blood pressure: 100/60. Patient status:  Outpatient.  ------------------------------------------------------------  ------------------------------------------------------------ Left ventricle: The cavity size was normal. There was mild concentric hypertrophy. Systolic function was normal. The estimated ejection fraction was in the range of 60% to 65%. Wall motion was normal; there were no regional wall motion abnormalities. There was an increased relative contribution of atrial contraction to ventricular filling. Doppler parameters are consistent with abnormal left ventricular relaxation (grade 1 diastolic dysfunction). There was no evidence of elevated ventricular filling pressure by Doppler parameters.  ------------------------------------------------------------ Aortic valve: Poorly visualized. Trileaflet; moderately thickened, moderately calcified leaflets. Valve mobility was restricted. Doppler: There was moderate stenosis. VTI ratio of LVOT to aortic valve: 0.32. Valve area: 0.9cm^2(VTI). Indexed valve area: 0.37cm^2/m^2 (VTI). Peak velocity ratio of LVOT to aortic valve: 0.29. Valve area: 0.83cm^2 (Vmax). Mean gradient: 65mm Hg (S). Peak gradient: 84mm Hg (S).  ------------------------------------------------------------ Mitral valve: Mildly thickened leaflets . Doppler: Peak gradient: 1mm Hg (D).  ------------------------------------------------------------ Left atrium: The atrium was mildly dilated.  ------------------------------------------------------------ Atrial septum: Poorly visualized.  ------------------------------------------------------------ Right ventricle: The cavity size was normal. Wall thickness was normal. Systolic function was normal.  ------------------------------------------------------------ Pulmonic valve: Poorly visualized.  ------------------------------------------------------------ Tricuspid valve: Structurally normal valve. Leaflet separation was normal. Doppler:  Transvalvular velocity was within the normal range. No regurgitation.  ------------------------------------------------------------ Right atrium: The atrium was normal in size.  ------------------------------------------------------------ Pericardium: There was no pericardial effusion.  ------------------------------------------------------------ Systemic veins: Inferior vena cava: The vessel was normal in size; the respirophasic diameter changes were in the normal range (=  50%); findings are consistent with normal central venous pressure.  ------------------------------------------------------------  2D measurements Normal Doppler measurements Normal Left ventricle LVOT LVID ED, 49.3 mm 43-52 Peak vel, 110 cm/s ------ chord, S PLAX VTI, S 23.8 cm ------ LVID ES, 31 mm 23-38 Aortic valve chord, Peak vel, 375 cm/s ------ PLAX S FS, chord, 37 % >29 Mean vel, 267 cm/s ------ PLAX S LVPW, ED 13 mm ------ VTI, S 74.7 cm ------ IVS/LVPW 1 <1.3 Mean 33 mm Hg ------ ratio, ED gradient, Ventricular septum S IVS, ED 13 mm ------ Peak 56 mm Hg ------ LVOT gradient, Diam, S 19 mm ------ S Area 2.84 cm^2 ------ VTI ratio 0.32 ------ Aorta LVOT/AV Root diam, 31 mm ------ Area, VTI 0.9 cm^2 ------ ED Area index 0.37 cm^2/m ------ Left atrium (VTI) ^2 AP dim 38 mm ------ Peak vel 0.29 ------ AP dim 1.55 cm/m^2 <2.2 ratio, index LVOT/AV Vol, S 56 ml ------ Area, Vmax 0.83 cm^2 ------ Vol index, 22.9 ml/m^2 ------ Mitral valve S Peak E vel 71.3 cm/s ------ Peak A vel 105 cm/s ------ Decelerati 317 ms 150-23 on time 0 Peak 2 mm Hg ------ gradient, D Peak E/A 0.7 ------ ratio Tricuspid valve Regurg 115 cm/s ------ peak vel Peak RV-RA 5 mm Hg ------ gradient, S Pulmonic valve Peak vel, 150 cm/s ------ S  ------------------------------------------------------------ Prepared and Electronically Authenticated by  Prentice Docker, MD [id number]           Transthoracic Echocardiography  Patient: Nitish, Roes MR #: [id number] Study Date: 09/14/2013 Gender: M Age: 52 Height: 180.3cm Weight: 129.3kg BSA: 2.37m^2 Pt. Status: Room:  SONOGRAPHER Jeryl Columbia ATTENDING Prentice Docker, MD ORDERING Prentice Docker, MD REFERRING Prentice Docker, MD PERFORMING [place] cc:  ------------------------------------------------------------ LV EF: 60% - 65%  ------------------------------------------------------------ Indications: Aortic stenosis 424.1.  ------------------------------------------------------------ History: PMH: Former Smoker Atrial flutter. Chronic obstructive pulmonary disease. Risk factors: Hypertension. Dyslipidemia.  ------------------------------------------------------------ Study Conclusions  - Left ventricle: The cavity size was normal. Wall thickness was increased in a pattern of mild LVH. Systolic function was normal. The estimated ejection fraction was in the range of 60% to 65%. Cannot exclude hypokinesis of the basalinferior myocardium. The study is not technically sufficient to allow evaluation of LV diastolic function. - Aortic valve: Moderately calcified annulus. Probably trileaflet; severely calcified leaflets. Cusp separation was reduced. There was severe stenosis. No significant regurgitation. Mean gradient: 41mm Hg (S). Peak gradient: 54mm Hg (S). Valve area: 0.97cm^2(VTI). Peak velocity ratio of LVOT to aortic valve: 0.28. Valve area: 0.88cm^2 (Vmax). - Mitral valve: Calcified annulus. Trivial regurgitation. - Left atrium: The atrium was mildly dilated. - Right atrium: The atrium was mildly dilated. Central venous pressure: 3mm Hg (est). - Tricuspid valve: Trivial regurgitation. - Pulmonary arteries: Systolic pressure could not be accurately estimated. - Pericardium, extracardiac: There was no pericardial effusion. Impressions:  - Mild LVH with LVEF  60-65%, cannot exclude basal inferior hypokinesis. Indeterminate diastolic function in the setting of atrial arrhythmia. Mild biatrial enlargement. MAC with trivial mitral regurgitation. Severe calcific aortic stenosis as outlined above. Gradients have increased somewhat compared to prior study. Unable to assess PASP.  ------------------------------------------------------------ Labs, prior tests, procedures, and surgery: Coronary artery bypass grafting.  Transthoracic echocardiography. M-mode, complete 2D, spectral Doppler, and color Doppler. Height: Height: 180.3cm. Height: 71in. Weight: Weight: 129.3kg. Weight: 284.4lb. Body mass index: BMI: 39.7kg/m^2. Body surface area: BSA: 2.81m^2. Patient status: Outpatient. Location: Echo laboratory.  ------------------------------------------------------------  ------------------------------------------------------------ Left ventricle: The cavity size was normal. Wall thickness was increased in  a pattern of mild LVH. Systolic function was normal. The estimated ejection fraction was in the range of 60% to 65%. Regional wall motion abnormalities: Cannot exclude hypokinesis of the basalinferior myocardium. The study is not technically sufficient to allow evaluation of LV diastolic function.  ------------------------------------------------------------ Aortic valve: Moderately calcified annulus. Probably trileaflet; severely calcified leaflets. Cusp separation was reduced. Doppler: There was severe stenosis. No significant regurgitation. Valve area: 0.97cm^2(VTI). Indexed valve area: 0.4cm^2/m^2 (VTI). Peak velocity ratio of LVOT to aortic valve: 0.28. Valve area: 0.88cm^2 (Vmax). Indexed valve area: 0.36cm^2/m^2 (Vmax). Mean gradient: 41mm Hg (S). Peak gradient: 54mm Hg (S).  ------------------------------------------------------------ Aorta: Aortic root: The aortic root was normal in  size.  ------------------------------------------------------------ Mitral valve: Calcified annulus. Doppler: Trivial regurgitation. Peak gradient: 4mm Hg (D).  ------------------------------------------------------------ Left atrium: The atrium was mildly dilated.  ------------------------------------------------------------ Right ventricle: The cavity size was normal. Systolic function was normal.  ------------------------------------------------------------ Pulmonic valve: The valve appears to be grossly normal. Doppler: Physiologic regurgitation.  ------------------------------------------------------------ Tricuspid valve: The valve appears to be grossly normal. Doppler: Trivial regurgitation.  ------------------------------------------------------------ Pulmonary artery: Systolic pressure could not be accurately estimated.  ------------------------------------------------------------ Right atrium: The atrium was mildly dilated.  ------------------------------------------------------------ Pericardium: There was no pericardial effusion.  ------------------------------------------------------------ Systemic veins: Inferior vena cava: The vessel was normal in size; the respirophasic diameter changes were in the normal range (= 50%); findings are consistent with normal central venous pressure.  ------------------------------------------------------------  2D measurements Normal Doppler measurements Normal Left ventricle Left ventricle LVID ED, 37.2 mm 43-52 Ea, lat 12.1 cm/s ------ chord, ann, tiss PLAX DP LVID ES, 28.2 mm 23-38 E/Ea, lat 8.76 ------ chord, ann, tiss PLAX DP FS, chord, 24 % >29 LVOT PLAX Peak vel, 103 cm/s ------ LVPW, ED 15.7 mm ------ S IVS/LVPW 0.96 <1.3 Aortic valve ratio, ED Peak vel, 367 cm/s ------ Ventricular septum S IVS, ED 15.1 mm ------ Mean vel, 301 cm/s ------ LVOT S Diam, S 20 mm ------ VTI, S 84.5 cm ------ Area 3.14 cm^2  ------ Mean 41 mm Hg ------ Aorta gradient, Root diam, 26 mm ------ S ED Peak 54 mm Hg ------ Left atrium gradient, AP dim 47 mm ------ S AP dim 1.92 cm/m^2 <2.2 Area, VTI 0.97 cm^2 ------ index Area index 0.4 cm^2/m ------ (VTI) ^2 Peak vel 0.28 ------ ratio, LVOT/AV Area, Vmax 0.88 cm^2 ------ Area index 0.36 cm^2/m ------ (Vmax) ^2 Mitral valve Peak E vel 106 cm/s ------ Peak A vel 48 cm/s ------ Decelerati 141 ms 150-23 on time 0 Peak 4 mm Hg ------ gradient, D Peak E/A 2.2 ------ ratio Systemic veins Estimated 3 mm Hg ------ CVP  ------------------------------------------------------------ Prepared and Electronically Authenticated by  Nona Dell 2015-05-15T13:45:[id number]     Transthoracic Echocardiography  Patient: Woodard, Perrell MR #: [id number] Study Date: 01/30/2014 Gender: M Age: 52 Height: 180.3 cm Weight: 137.4 kg BSA: 2.69 m^2 Pt. Status: Room: 3S15C  ADMITTING Cassell Clement ATTENDING Rollene Rotunda SONOGRAPHER Nolon Rod, RDCS ORDERING Verne Carrow REFERRING McAlhany, Christopher PERFORMING [facility], Inpatient  cc:  ------------------------------------------------------------------- LV EF: 50% - 55%  ------------------------------------------------------------------- Indications: Aortic stenosis 424.1.  ------------------------------------------------------------------- History: PMH: Congestive heart failure. Aortic valve disease. Chronic obstructive pulmonary disease. Risk factors: Former tobacco use. Hypertension.  ------------------------------------------------------------------- Study Conclusions  - Left ventricle: The cavity size was normal. Wall thickness was normal. Systolic function was normal. The estimated ejection fraction was in the range of 50% to 55%. Wall motion was normal; there were no regional wall motion abnormalities. - Aortic valve: There was severe stenosis.  Valve area (VTI): 0.82 cm^2.  Valve area (Vmax): 0.74 cm^2. Valve area (Vmean): 0.75 cm^2. - Left atrium: The atrium was moderately dilated. - Right atrium: The atrium was moderately dilated. - Pulmonary arteries: PA peak pressure: 33 mm Hg (S).  ------------------------------------------------------------------- Labs, prior tests, procedures, and surgery: Coronary artery bypass grafting.  Transthoracic echocardiography. M-mode, complete 2D, spectral Doppler, and color Doppler. Birthdate: Patient birthdate: [birth year]. Age: Patient is 68 yr old. Sex: Gender: male. BMI: 42.3 kg/m^2. Blood pressure: 104/59 Patient status: Inpatient. Study date: Study date: 01/30/2014. Study time: 03:09 PM. Location: ICU/CCU  -------------------------------------------------------------------  ------------------------------------------------------------------- Left ventricle: The cavity size was normal. Wall thickness was normal. Systolic function was normal. The estimated ejection fraction was in the range of 50% to 55%. Wall motion was normal; there were no regional wall motion abnormalities.  ------------------------------------------------------------------- Aortic valve: Moderately thickened, moderately calcified leaflets. Doppler: There was severe stenosis. There was no regurgitation. VTI ratio of LVOT to aortic valve: 0.24. Valve area (VTI): 0.82 cm^2. Indexed valve area (VTI): 0.31 cm^2/m^2. Valve area (Vmax): 0.74 cm^2. Indexed valve area (Vmax): 0.28 cm^2/m^2. Mean velocity ratio of LVOT to aortic valve: 0.22. Valve area (Vmean): 0.75 cm^2. Indexed valve area (Vmean): 0.28 cm^2/m^2. Mean gradient (S): 52 mm Hg. Peak gradient (S): 87 mm Hg.  ------------------------------------------------------------------- Aorta: Aortic root: The aortic root was normal in size. Ascending aorta: The ascending aorta was normal in size.  ------------------------------------------------------------------- Mitral valve: Structurally  normal valve. Leaflet separation was normal. Doppler: Transvalvular velocity was within the normal range. There was no evidence for stenosis. There was trivial regurgitation. Peak gradient (D): 7 mm Hg.  ------------------------------------------------------------------- Left atrium: The atrium was moderately dilated.  ------------------------------------------------------------------- Right ventricle: The cavity size was normal. Systolic function was normal.  ------------------------------------------------------------------- Pulmonic valve: The valve appears to be grossly normal. Doppler: There was no significant regurgitation.  ------------------------------------------------------------------- Tricuspid valve: Structurally normal valve. Leaflet separation was normal. Doppler: Transvalvular velocity was within the normal range. There was trivial regurgitation.  ------------------------------------------------------------------- Pulmonary artery: Systolic pressure was within the normal range.  ------------------------------------------------------------------- Right atrium: The atrium was moderately dilated.  ------------------------------------------------------------------- Pericardium: There was no pericardial effusion.  ------------------------------------------------------------------- Measurements  Left ventricle Value Reference LV ID, ED, PLAX chordal 50.9 mm 43 - 52 LV ID, ES, PLAX chordal (H) 41.1 mm 23 - 38 LV fx shortening, PLAX chordal (L) 19 % >=29 LV PW thickness, ED 10.3 mm --------- IVS/LV PW ratio, ED 1.16 <=1.3 Stroke volume, 2D 72 ml --------- Stroke volume/bsa, 2D 27 ml/m^2 --------- LV e&', lateral 10.9 cm/s --------- LV E/e&', lateral 12.02 --------- LV e&', medial 8.44 cm/s --------- LV E/e&', medial 15.52 --------- LV e&', average 9.67 cm/s --------- LV E/e&', average 13.55 ---------  Ventricular septum Value Reference IVS thickness, ED 11.9  mm ---------  LVOT Value Reference LVOT ID, S 21 mm --------- LVOT area 3.46 cm^2 --------- LVOT peak velocity, S 99.4 cm/s --------- LVOT mean velocity, S 72.8 cm/s --------- LVOT VTI, S 20.9 cm --------- LVOT peak gradient, S 4 mm Hg ---------  Aortic valve Value Reference Aortic valve mean velocity, S 338 cm/s --------- Aortic valve VTI, S 88 cm --------- Aortic mean gradient, S 52 mm Hg --------- Aortic peak gradient, S 87 mm Hg --------- VTI ratio, LVOT/AV 0.24 --------- Aortic valve area, VTI 0.82 cm^2 --------- Aortic valve area/bsa, VTI 0.31 cm^2/m^2 --------- Aortic valve area, peak velocity 0.74 cm^2 --------- Aortic valve area/bsa, peak 0.28 cm^2/m^2 --------- velocity Velocity ratio, mean, LVOT/AV 0.22 --------- Aortic valve area, mean velocity  0.75 cm^2 --------- Aortic valve area/bsa, mean 0.28 cm^2/m^2 --------- velocity  Aorta Value Reference Aortic root ID, ED 27 mm ---------  Left atrium Value Reference LA ID, A-P, ES 52 mm --------- LA ID/bsa, A-P 1.94 cm/m^2 <=2.2 LA volume, ES, 1-p A4C 76.2 ml --------- LA volume/bsa, ES, 1-p A4C 28.4 ml/m^2 --------- LA volume, ES, 1-p A2C 83.7 ml --------- LA volume/bsa, ES, 1-p A2C 31.2 ml/m^2 ---------  Mitral valve Value Reference Mitral E-wave peak velocity 131 cm/s --------- Mitral A-wave peak velocity 73.8 cm/s --------- Mitral deceleration time (L) 144 ms 150 - 230 Mitral peak gradient, D 7 mm Hg --------- Mitral E/A ratio, peak 1.7 ---------  Pulmonary arteries Value Reference PA pressure, S, DP (H) 33 mm Hg <=30  Tricuspid valve Value Reference Tricuspid regurg peak velocity 248 cm/s --------- Tricuspid peak RV-RA gradient 25 mm Hg ---------  Systemic veins Value Reference Estimated CVP 8 mm Hg ---------  Right ventricle Value Reference RV pressure, S, DP (H) 33 mm Hg <=30  Legend: (L) and (H) mark values outside specified reference  range.  ------------------------------------------------------------------- Prepared and Electronically Authenticated by  Kristeen Miss, M.D. 2015-09-30T20:17:35    Cardiac TAVR CT  TECHNIQUE: The patient was scanned on a Philips 256 scanner. A 120 kV retrospective scan was triggered in the descending thoracic aorta at 111 HU's. Gantry rotation speed was 270 msecs and collimation was .9 mm. 10 mg of iv Metoprolol and no nitro were given. The 3D data set was reconstructed in 5% intervals of the R-R cycle. Systolic and diastolic phases were analyzed on a dedicated work station using MPR, MIP and VRT modes. The patient received 80 cc of contrast.  FINDINGS: Aortic Valve: Severely thickened and calcified with moderately restricted leaflet opening.  Aorta: Normal size, mild diffuse calcifications, no dissection.  Sinotubular Junction: 27 x 26 mm  Ascending Thoracic Aorta: 31 x 31 mm  Aortic Arch: 27 x 26 mm  Descending Thoracic Aorta: 28 x 27 mm  Sinus of Valsalva Measurements:  Non-coronary: 31 mm  Right -coronary: 30 mm  Left -coronary: 31 mm  Coronary Artery Height above Annulus:  Left Main: 10 mm  Right Coronary: 16 mm  Virtual Basal Annulus Measurements:  Maximum/Minimum Diameter: 28 x 25 mm  Perimeter: 99 mm  Area: 560 mm2  Coronary Arteries: The study was performed without use of NTG and is suboptimal for coronary evaluation.  Optimum Fluoroscopic Angle for Delivery: RAO 1 CRA 1  IMPRESSION: 1. Severely thickened and calcified aortic valve with annular measurements suitable for delivery of soft 29 mm Edward SAPIEN XT valve.  2. Optimum Fluoroscopic Angle for Delivery: RAO 1 CRA 1  3. Sheath to tip distance sufficient for transaortic approach (76 mm).  [name]   Electronically Signed  By: [name]  On: 02/08/2014 08:36      Study Result     EXAM: OVER-READ INTERPRETATION  CT CHEST  The following report is an over-read performed by radiologist Dr. Brock Bad [facility] on 02/07/2014. This over-read does not include interpretation of cardiac or coronary anatomy or pathology. The coronary calcium score/coronary CTA interpretation by the cardiologist is attached.  COMPARISON: 01/28/2014  FINDINGS: Aortic and branch vessel atherosclerosis. Small new mediastinal lymph nodes are not pathologically enlarged by size criteria. Suspected upper abdominal ventral hernia containing adipose tissue and probably fluid, image 107 of series 201.  Severe centrilobular emphysema. Bilateral airway thickening. Mild lingular and bilateral lower lobe atelectasis. Scarring medially at the right lung apex.  Probable mucus in the trachea, image 9 series 512.  Perihepatic ascites. Scattered lymph nodes in the gastrohepatic ligament. Nodular contour of liver compatible with cirrhosis.  NONCARDIAC IMPRESSION: 1. Severe centrilobular emphysema. 2. Bilateral airway thickening favoring bronchitis. 3. Scattered lingular and bilateral lower lobe atelectasis. 4. Perihepatic ascites. 5. Cirrhosis with scattered lymph nodes in the gastrohepatic ligament which are probably reactive. 6. Aortic and branch vessel atherosclerosis. 7. Upper abdominal ventral hernia containing adipose tissue and probably a small amount of fluid.  Electronically Signed: By: Herbie Baltimore M.D. On: 02/07/2014 17:08       CTA CHEST ABDOMEN AND PELVIS WITH CONTRAST  TECHNIQUE: Multidetector CT imaging of the chest, abdomen and pelvis was performed using the standard protocol during bolus administration of intravenous contrast. Multiplanar reconstructed images and MIPs were obtained and reviewed to evaluate the vascular anatomy.  CONTRAST: OMNIPAQUE IOHEXOL 350 MG/ML SOLN  COMPARISON: Chest CT 01/28/2014. CT of the abdomen and pelvis without contrast  10/17/2012.  FINDINGS: CT CHEST FINDINGS  Mediastinum: Heart size is borderline enlarged. There is no significant pericardial fluid, thickening or pericardial calcification. Severely calcified and thickened aortic valve. There is atherosclerosis of the thoracic aorta, the great vessels of the mediastinum and the coronary arteries, including calcified atherosclerotic plaque in the left main, left anterior descending, left circumflex and right coronary arteries. Status post median sternotomy for CABG, including LIMA to the LAD. No pathologically enlarged mediastinal or hilar lymph nodes. Esophagus is unremarkable in appearance. Multiple small paraesophageal varices.  Lungs/Pleura: Areas of dependent bibasilar subsegmental atelectasis are noted. Mild chronic scarring in the right apex is unchanged. Mild diffuse bronchial wall thickening. There is a background of mild centrilobular and paraseptal emphysema, most notable in the lung apices. No acute consolidative airspace disease. No pleural effusions. No suspicious appearing pulmonary nodules or masses.  Musculoskeletal: Median sternotomy wires. Orthopedic fixation hardware in the lower cervical spine incompletely visualized. There are no aggressive appearing lytic or blastic lesions noted in the visualized portions of the skeleton.  CT ABDOMEN AND PELVIS FINDINGS  Hepatobiliary: Liver has a shrunken appearance and nodular contour, compatible with underlying cirrhosis. No definite focal hepatic lesions are identified. No intra or extrahepatic biliary ductal dilatation. Tiny calcified gallstones layer dependently in the gallbladder measuring 3-4 mm in size. No definite signs of acute cholecystitis at this time.  Pancreas: Extensive pancreatic calcifications throughout the head and proximal body of the pancreas, presumably sequela of chronic pancreatitis. No focal pancreatic lesions. No pancreatic  ductal dilatation.  Spleen: The spleen is enlarged measuring 6.4 x 15.0 x 13.4 cm (estimated splenic volume of 643 mL).  Adrenals/Urinary Tract: Adrenal glands are normal in appearance. Multiple low-attenuation lesions are noted in the kidneys bilaterally, the majority of which are too small to definitively characterize. The larger of these lesions are all compatible with simple cysts, with the largest simple cyst measuring 2.6 cm in diameter in the medial aspect of the interpolar region of the left kidney. No hydroureteronephrosis. Urinary bladder is unremarkable in appearance.  Stomach/Bowel: Normal appearance of the stomach. No pathologic dilatation of the small bowel or colon. A few scattered colonic diverticulae are noted, without surrounding inflammatory changes to suggest an acute diverticulitis at this time.  Vascular/Lymphatic: Extensive atherosclerosis throughout the abdominal and pelvic vasculature, with findings and measurements pertinent to potential TAVR procedure, as discussed below. In addition, there are prominent atherosclerotic plaques at the ostia of both the celiac axis and the superior mesenteric artery, and there appears likely to be  a potentially hemodynamically significant stenosis in excess of 50% at the ostium of the superior mesenteric artery. Inferior mesenteric artery is patent.  Reproductive: Prostate gland is unremarkable in appearance.  Other: Trace volume of ascites, predominantly perihepatic. Tiny ventral hernia and epigastric region containing predominantly omental fat and a small volume of ascites. No pneumoperitoneum.  Musculoskeletal: There are no aggressive appearing lytic or blastic lesions noted in the visualized portions of the skeleton. Interbody graft at the L3-L4 interspace.  VASCULAR MEASUREMENTS PERTINENT TO TAVR:  AORTA:  Minimal Aortic Diameter - 14 x 12 mm  Severity of Aortic Calcification - moderate to  severe  RIGHT PELVIS:  Right Common Iliac Artery -  Minimal Diameter - 7.6 x 5.9 mm  Tortuosity - Mild  Calcification - Severe  Right External Iliac Artery -  Minimal Diameter - 7.8 x 4.2 mm  Tortuosity - Mild  Calcification - Moderate  Right Common Femoral Artery -  Minimal Diameter - 8.7 x 4.9 mm  Tortuosity - Mild  Calcification - Severe  LEFT PELVIS:  Left Common Iliac Artery -  Minimal Diameter - 6.9 x 6.2 mm  Tortuosity - Mild  Calcification - Severe  Left External Iliac Artery -  Minimal Diameter - 6.6 x 4.8 mm  Tortuosity - Mild  Calcification - Moderate  Left Common Femoral Artery -  Minimal Diameter - 5.2 x 4.5 mm  Tortuosity - Mild  Calcification - Severe  Review of the MIP images confirms the above findings.  IMPRESSION: 1. Findings and measurements pertinent to potential TAVR procedure, as detailed above. Although the patient does have extensive pelvic arterial calcification, and does not have suitable access from the left side, there appears to be potentially suitable access on the right side. 2. Mild diffuse bronchial wall thickening with mild centrilobular and paraseptal emphysema; imaging findings suggestive of underlying COPD. 3. Stigmata of cirrhosis with portal hypertension and associated splenomegaly, as above. 4. Cholelithiasis without evidence to suggest acute cholecystitis at this time. 5. Mild colonic diverticulosis without findings to suggest acute diverticulitis at this time. 6. In addition to the vascular findings pertinent to potential TAVR procedure, there is significant atherosclerosis at the ostia of the celiac axis and SMA, with greater than 50% stenosis at the origin of the SMA.   Electronically Signed  By: Trudie Reed M.D.  On: 02/11/2014 09:06   STS Risk Calculator  ProcedureRedo CABG + AVR  Risk of  Mortality14.4% Morbidity or Mortality52.7% Prolonged LOS34.6% Short LOS8.4% Permanent Stroke2.5% Prolonged Vent Support47.7% DSW Infection2.6% Renal Failure20.5% Reoperation17.6%     Impression:  The patient has stage D severe symptomatic aortic stenosis with preserved LV systolic function and chronic symptoms of exertional shortness of breath and lower extremity edema consistent with chronic diastolic congestive heart failure, NYHA functional class III. He has recently had an acute exacerbation of stage IV symptoms of CHF associated with occult GI blood loss resulting in need for hospitalization in early October. He improved markedly with diuresis, but he was found to have underlying cirrhosis at that time. He has multiple severe chronic medical problems including morbid obesity, severe COPD, rheumatoid arthritis, cirrhosis with portal hypertension, anemia of undermined origin, persistent atrial flutter, and prior CABG. The patient has been independently evaluated by two cardiac surgeons including myself and Dr. Laneta Simmers, and both of [place] feel that the patient would be a poor candidate for conventional surgery. Based upon review of his preoperative diagnostic tests the patient appears to be an acceptable candidate for transcatheter aortic valve  replacement using a 29 mm Edwards Sapien transcatheter heart valve.  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  However, we will be prepared to convert to the  transapical approach if we are unable to safely pass an introducing sheath through the aortoiliac bifurcation.   Plan:  The patient has been counseled extensively as to the relative risks and benefits of all options for the treatment of severe aortic stenosis including long term medical therapy, conventional surgery for aortic valve replacement, and transcatheter aortic valve replacement. Following the decision to proceed with transcatheter aortic valve replacement, a discussion has been held regarding what types of management strategies would be attempted intraoperatively in the event of life-threatening complications, including whether or not the patient would be considered a candidate for the use of cardiopulmonary bypass and/or conversion to open sternotomy for attempted surgical intervention. The patient has been advised of a variety of complications that might develop including but not limited to risks of death, stroke, paravalvular leak, aortic dissection or other major vascular complications, aortic annulus rupture, device embolization, cardiac rupture or perforation, mitral regurgitation, acute myocardial infarction, arrhythmia, heart block or bradycardia requiring permanent pacemaker placement, congestive heart failure, respiratory failure, renal failure, pneumonia, infection, other late complications related to structural valve deterioration or migration, or other complications that might ultimately cause a temporary or permanent loss of functional independence or other long term morbidity. The patient provides full informed consent for the procedure as described and all questions were answered. The patient has been instructed to stop taking metformin but continue all other medications without change tomorrow. On the morning of surgery the patient will take only Cardizem CD, Toprol-XL, and Keflex with a sip of water. On the morning of surgery he will take half his usual dose of long-acting  insulin.     Salvatore Decent. Cornelius Moras, MD 04/01/2014 5:29 PM

## 2014-04-01 NOTE — Progress Notes (Addendum)
Primary -dr. Reuel Boom ([place]) Cardiologist - dr. Purvis Sheffield Cath echo ekg in epic  Patient states he was instructed by dr. Cornelius Moras to take 1/2 dose of his insulin on morning of surgery.

## 2014-04-02 LAB — HEMOGLOBIN A1C
Hgb A1c MFr Bld: 5.8 % — ABNORMAL HIGH (ref <5.7)
Mean Plasma Glucose: 120 mg/dL — ABNORMAL HIGH (ref <117)

## 2014-04-02 MED ORDER — SODIUM CHLORIDE 0.9 % IV SOLN
INTRAVENOUS | Status: DC
  Filled 2014-04-02: qty 2.5

## 2014-04-02 MED ORDER — MAGNESIUM SULFATE 50 % IJ SOLN
40.0000 meq | INTRAMUSCULAR | Status: DC
  Filled 2014-04-02: qty 10

## 2014-04-02 MED ORDER — DEXMEDETOMIDINE HCL IN NACL 400 MCG/100ML IV SOLN
0.1000 ug/kg/h | INTRAVENOUS | Status: DC
Start: 2014-04-03 — End: 2014-04-03
  Filled 2014-04-02: qty 100

## 2014-04-02 MED ORDER — NITROGLYCERIN IN D5W 200-5 MCG/ML-% IV SOLN
2.0000 ug/min | INTRAVENOUS | Status: DC
  Filled 2014-04-02: qty 250

## 2014-04-02 MED ORDER — DEXTROSE 5 % IV SOLN
1.5000 g | INTRAVENOUS | Status: AC
  Administered 2014-04-03: 1.5 g via INTRAVENOUS
  Filled 2014-04-02 (×2): qty 1.5

## 2014-04-02 MED ORDER — DOPAMINE-DEXTROSE 3.2-5 MG/ML-% IV SOLN
0.0000 ug/kg/min | INTRAVENOUS | Status: DC
  Filled 2014-04-02: qty 250

## 2014-04-02 MED ORDER — VANCOMYCIN HCL 10 G IV SOLR
1250.0000 mg | INTRAVENOUS | Status: AC
  Administered 2014-04-03: 120 mg via INTRAVENOUS
  Filled 2014-04-02 (×2): qty 1250

## 2014-04-02 MED ORDER — POTASSIUM CHLORIDE 2 MEQ/ML IV SOLN
80.0000 meq | INTRAVENOUS | Status: DC
  Filled 2014-04-02: qty 40

## 2014-04-02 MED ORDER — NOREPINEPHRINE BITARTRATE 1 MG/ML IV SOLN
0.0000 ug/min | INTRAVENOUS | Status: DC
  Filled 2014-04-02: qty 4

## 2014-04-02 MED ORDER — EPINEPHRINE HCL 1 MG/ML IJ SOLN
0.0000 ug/min | INTRAVENOUS | Status: DC
  Filled 2014-04-02: qty 4

## 2014-04-02 MED ORDER — SODIUM CHLORIDE 0.9 % IV SOLN
INTRAVENOUS | Status: DC
  Filled 2014-04-02: qty 30

## 2014-04-02 MED ORDER — PHENYLEPHRINE HCL 10 MG/ML IJ SOLN
30.0000 ug/min | INTRAVENOUS | Status: AC
  Administered 2014-04-03: 20 ug/min via INTRAVENOUS
  Filled 2014-04-02: qty 2

## 2014-04-02 NOTE — Progress Notes (Signed)
Anesthesia Chart Review: Patient is a 68 year old male scheduled for TAVR, transapical approach on 04/03/14 by Dr. Cornelius Moras.  He is s/p teeth extractions on 03/12/14.  See my note from 03/08/14 or Dr. Orvan July H&P detailing history and recent cardiac studies.    EKG on 04/01/14: Aflutter with variable AV block, right BBB. No significant change since last tracing.  PFTs 04/30/13: FVC 2.30 (49%), FEV1 1.20 (34%), FEF 25-75% 0.42 (15%), DLCO 20.05 (59%).  Preoperative CXR and labs noted.  AST 72, ALT 40, PLT count 109 K which I think are overall stable and consistent with his history of cirrhosis. His glucose was low at 44. He is a known type 2 diabetic on insulin and oral agents.  His metformin will be on hold tomorrow. His A1C was 5.8.  He will get a fasting CBG on arrival.    Velna Ochs [facility]Anesthesiology Phone [phone] 04/02/2014 9:50 AM

## 2014-04-03 ENCOUNTER — Inpatient Hospital Stay (HOSPITAL_COMMUNITY)
Admission: RE | Admit: 2014-04-03 | Discharge: 2014-04-09 | DRG: 266 | Disposition: A | Discharge status: Home / Self Care | Payer: Medicare Other | Source: Ambulatory Visit | Attending: Cardiovascular Disease | Admitting: Cardiovascular Disease

## 2014-04-03 ENCOUNTER — Inpatient Hospital Stay (HOSPITAL_COMMUNITY): Payer: Medicare Other | Admitting: Vascular Surgery

## 2014-04-03 ENCOUNTER — Inpatient Hospital Stay (HOSPITAL_COMMUNITY): Payer: Medicare Other

## 2014-04-03 ENCOUNTER — Encounter (HOSPITAL_COMMUNITY)
Admission: RE | Disposition: A | Discharge status: Home / Self Care | Payer: Medicare Other | Source: Ambulatory Visit | Attending: Cardiovascular Disease

## 2014-04-03 ENCOUNTER — Encounter (HOSPITAL_COMMUNITY): Payer: Self-pay | Admitting: *Deleted

## 2014-04-03 ENCOUNTER — Inpatient Hospital Stay (HOSPITAL_COMMUNITY): Payer: Medicare Other | Admitting: Certified Registered"

## 2014-04-03 DIAGNOSIS — Z951 Presence of aortocoronary bypass graft: Secondary | ICD-10-CM | POA: Diagnosis not present

## 2014-04-03 DIAGNOSIS — M069 Rheumatoid arthritis, unspecified: Secondary | ICD-10-CM | POA: Diagnosis present

## 2014-04-03 DIAGNOSIS — Z794 Long term (current) use of insulin: Secondary | ICD-10-CM

## 2014-04-03 DIAGNOSIS — I10 Essential (primary) hypertension: Secondary | ICD-10-CM | POA: Diagnosis present

## 2014-04-03 DIAGNOSIS — E876 Hypokalemia: Secondary | ICD-10-CM | POA: Diagnosis not present

## 2014-04-03 DIAGNOSIS — N4889 Other specified disorders of penis: Secondary | ICD-10-CM | POA: Diagnosis not present

## 2014-04-03 DIAGNOSIS — I25810 Atherosclerosis of coronary artery bypass graft(s) without angina pectoris: Secondary | ICD-10-CM | POA: Diagnosis present

## 2014-04-03 DIAGNOSIS — I482 Chronic atrial fibrillation: Secondary | ICD-10-CM | POA: Diagnosis present

## 2014-04-03 DIAGNOSIS — I350 Nonrheumatic aortic (valve) stenosis: Secondary | ICD-10-CM | POA: Diagnosis present

## 2014-04-03 DIAGNOSIS — I447 Left bundle-branch block, unspecified: Secondary | ICD-10-CM | POA: Diagnosis present

## 2014-04-03 DIAGNOSIS — Z823 Family history of stroke: Secondary | ICD-10-CM | POA: Diagnosis not present

## 2014-04-03 DIAGNOSIS — Z7952 Long term (current) use of systemic steroids: Secondary | ICD-10-CM

## 2014-04-03 DIAGNOSIS — I5033 Acute on chronic diastolic (congestive) heart failure: Secondary | ICD-10-CM | POA: Diagnosis present

## 2014-04-03 DIAGNOSIS — Z006 Encounter for examination for normal comparison and control in clinical research program: Secondary | ICD-10-CM

## 2014-04-03 DIAGNOSIS — J449 Chronic obstructive pulmonary disease, unspecified: Secondary | ICD-10-CM | POA: Diagnosis present

## 2014-04-03 DIAGNOSIS — K766 Portal hypertension: Secondary | ICD-10-CM | POA: Diagnosis present

## 2014-04-03 DIAGNOSIS — I2510 Atherosclerotic heart disease of native coronary artery without angina pectoris: Secondary | ICD-10-CM | POA: Diagnosis present

## 2014-04-03 DIAGNOSIS — J45909 Unspecified asthma, uncomplicated: Secondary | ICD-10-CM | POA: Diagnosis present

## 2014-04-03 DIAGNOSIS — K7460 Unspecified cirrhosis of liver: Secondary | ICD-10-CM | POA: Diagnosis present

## 2014-04-03 DIAGNOSIS — E785 Hyperlipidemia, unspecified: Secondary | ICD-10-CM | POA: Diagnosis present

## 2014-04-03 DIAGNOSIS — D509 Iron deficiency anemia, unspecified: Secondary | ICD-10-CM | POA: Diagnosis present

## 2014-04-03 DIAGNOSIS — Z955 Presence of coronary angioplasty implant and graft: Secondary | ICD-10-CM | POA: Diagnosis not present

## 2014-04-03 DIAGNOSIS — M5136 Other intervertebral disc degeneration, lumbar region: Secondary | ICD-10-CM | POA: Diagnosis present

## 2014-04-03 DIAGNOSIS — K861 Other chronic pancreatitis: Secondary | ICD-10-CM | POA: Diagnosis present

## 2014-04-03 DIAGNOSIS — E119 Type 2 diabetes mellitus without complications: Secondary | ICD-10-CM | POA: Diagnosis present

## 2014-04-03 DIAGNOSIS — Z952 Presence of prosthetic heart valve: Secondary | ICD-10-CM

## 2014-04-03 DIAGNOSIS — Z8249 Family history of ischemic heart disease and other diseases of the circulatory system: Secondary | ICD-10-CM

## 2014-04-03 DIAGNOSIS — Z6841 Body Mass Index (BMI) 40.0 and over, adult: Secondary | ICD-10-CM

## 2014-04-03 DIAGNOSIS — I080 Rheumatic disorders of both mitral and aortic valves: Principal | ICD-10-CM | POA: Diagnosis present

## 2014-04-03 DIAGNOSIS — Z7982 Long term (current) use of aspirin: Secondary | ICD-10-CM | POA: Diagnosis not present

## 2014-04-03 DIAGNOSIS — I8500 Esophageal varices without bleeding: Secondary | ICD-10-CM | POA: Diagnosis present

## 2014-04-03 DIAGNOSIS — Z9981 Dependence on supplemental oxygen: Secondary | ICD-10-CM | POA: Diagnosis not present

## 2014-04-03 DIAGNOSIS — G8929 Other chronic pain: Secondary | ICD-10-CM | POA: Diagnosis present

## 2014-04-03 DIAGNOSIS — Z825 Family history of asthma and other chronic lower respiratory diseases: Secondary | ICD-10-CM | POA: Diagnosis not present

## 2014-04-03 DIAGNOSIS — Z7901 Long term (current) use of anticoagulants: Secondary | ICD-10-CM | POA: Diagnosis not present

## 2014-04-03 DIAGNOSIS — Z87891 Personal history of nicotine dependence: Secondary | ICD-10-CM | POA: Diagnosis not present

## 2014-04-03 DIAGNOSIS — R001 Bradycardia, unspecified: Secondary | ICD-10-CM | POA: Diagnosis not present

## 2014-04-03 DIAGNOSIS — I4892 Unspecified atrial flutter: Secondary | ICD-10-CM | POA: Diagnosis present

## 2014-04-03 DIAGNOSIS — K219 Gastro-esophageal reflux disease without esophagitis: Secondary | ICD-10-CM | POA: Diagnosis present

## 2014-04-03 DIAGNOSIS — Z800 Family history of malignant neoplasm of digestive organs: Secondary | ICD-10-CM | POA: Diagnosis not present

## 2014-04-03 DIAGNOSIS — N508 Other specified disorders of male genital organs: Secondary | ICD-10-CM | POA: Diagnosis not present

## 2014-04-03 HISTORY — DX: Presence of prosthetic heart valve: Z95.2

## 2014-04-03 HISTORY — PX: TRANSCATHETER AORTIC VALVE REPLACEMENT, TRANSFEMORAL: SHX6400

## 2014-04-03 HISTORY — PX: INTRAOPERATIVE TRANSESOPHAGEAL ECHOCARDIOGRAM: SHX5062

## 2014-04-03 LAB — POCT I-STAT, CHEM 8
BUN: 9 mg/dL (ref 6–23)
BUN: 9 mg/dL (ref 6–23)
BUN: 8 mg/dL (ref 6–23)
BUN: 12 mg/dL (ref 6–23)
CALCIUM ION: 1.25 mmol/L (ref 1.13–1.30)
CHLORIDE: 100 meq/L (ref 96–112)
CREATININE: 0.6 mg/dL (ref 0.50–1.35)
CREATININE: 0.5 mg/dL (ref 0.50–1.35)
CREATININE: 0.6 mg/dL (ref 0.50–1.35)
Calcium, Ion: 1.21 mmol/L (ref 1.13–1.30)
Calcium, Ion: 1.19 mmol/L (ref 1.13–1.30)
Calcium, Ion: 1.24 mmol/L (ref 1.13–1.30)
Chloride: 100 mEq/L (ref 96–112)
Chloride: 99 mEq/L (ref 96–112)
Chloride: 102 mEq/L (ref 96–112)
Creatinine, Ser: 0.8 mg/dL (ref 0.50–1.35)
GLUCOSE: 92 mg/dL (ref 70–99)
GLUCOSE: 97 mg/dL (ref 70–99)
GLUCOSE: 110 mg/dL — AB (ref 70–99)
Glucose, Bld: 141 mg/dL — ABNORMAL HIGH (ref 70–99)
HCT: 32 % — ABNORMAL LOW (ref 39.0–52.0)
HCT: 30 % — ABNORMAL LOW (ref 39.0–52.0)
HCT: 32 % — ABNORMAL LOW (ref 39.0–52.0)
HEMATOCRIT: 32 % — AB (ref 39.0–52.0)
HEMOGLOBIN: 10.9 g/dL — AB (ref 13.0–17.0)
HEMOGLOBIN: 10.9 g/dL — AB (ref 13.0–17.0)
Hemoglobin: 10.9 g/dL — ABNORMAL LOW (ref 13.0–17.0)
Hemoglobin: 10.2 g/dL — ABNORMAL LOW (ref 13.0–17.0)
Potassium: 3.4 mEq/L — ABNORMAL LOW (ref 3.7–5.3)
Potassium: 3.5 mEq/L — ABNORMAL LOW (ref 3.7–5.3)
Potassium: 3.5 mEq/L — ABNORMAL LOW (ref 3.7–5.3)
Potassium: 4.9 mEq/L (ref 3.7–5.3)
Sodium: 141 mEq/L (ref 137–147)
Sodium: 141 mEq/L (ref 137–147)
Sodium: 140 mEq/L (ref 137–147)
Sodium: 139 mEq/L (ref 137–147)
TCO2: 27 mmol/L (ref 0–100)
TCO2: 27 mmol/L (ref 0–100)
TCO2: 27 mmol/L (ref 0–100)
TCO2: 24 mmol/L (ref 0–100)

## 2014-04-03 LAB — POCT I-STAT 4, (NA,K, GLUC, HGB,HCT)
GLUCOSE: 121 mg/dL — AB (ref 70–99)
HEMATOCRIT: 36 % — AB (ref 39.0–52.0)
Hemoglobin: 12.2 g/dL — ABNORMAL LOW (ref 13.0–17.0)
Potassium: 3.9 mEq/L (ref 3.7–5.3)
SODIUM: 140 meq/L (ref 137–147)

## 2014-04-03 LAB — POCT I-STAT 3, ART BLOOD GAS (G3+)
ACID-BASE EXCESS: 5 mmol/L — AB (ref 0.0–2.0)
ACID-BASE EXCESS: 3 mmol/L — AB (ref 0.0–2.0)
BICARBONATE: 30.7 meq/L — AB (ref 20.0–24.0)
Bicarbonate: 26.7 mEq/L — ABNORMAL HIGH (ref 20.0–24.0)
Bicarbonate: 29.2 mEq/L — ABNORMAL HIGH (ref 20.0–24.0)
O2 Saturation: 100 %
O2 Saturation: 92 %
O2 Saturation: 98 %
PCO2 ART: 52.5 mmHg — AB (ref 35.0–45.0)
PH ART: 7.315 — AB (ref 7.350–7.450)
PO2 ART: 286 mmHg — AB (ref 80.0–100.0)
Patient temperature: 37.1
TCO2: 32 mmol/L (ref 0–100)
TCO2: 28 mmol/L (ref 0–100)
TCO2: 31 mmol/L (ref 0–100)
pCO2 arterial: 50.9 mmHg — ABNORMAL HIGH (ref 35.0–45.0)
pCO2 arterial: 49.1 mmHg — ABNORMAL HIGH (ref 35.0–45.0)
pH, Arterial: 7.389 (ref 7.350–7.450)
pH, Arterial: 7.383 (ref 7.350–7.450)
pO2, Arterial: 70 mmHg — ABNORMAL LOW (ref 80.0–100.0)
pO2, Arterial: 102 mmHg — ABNORMAL HIGH (ref 80.0–100.0)

## 2014-04-03 LAB — GLUCOSE, CAPILLARY
GLUCOSE-CAPILLARY: 90 mg/dL (ref 70–99)
GLUCOSE-CAPILLARY: 141 mg/dL — AB (ref 70–99)
GLUCOSE-CAPILLARY: 133 mg/dL — AB (ref 70–99)
GLUCOSE-CAPILLARY: 146 mg/dL — AB (ref 70–99)
GLUCOSE-CAPILLARY: 147 mg/dL — AB (ref 70–99)
Glucose-Capillary: 146 mg/dL — ABNORMAL HIGH (ref 70–99)
Glucose-Capillary: 155 mg/dL — ABNORMAL HIGH (ref 70–99)
Glucose-Capillary: 143 mg/dL — ABNORMAL HIGH (ref 70–99)
Glucose-Capillary: 164 mg/dL — ABNORMAL HIGH (ref 70–99)

## 2014-04-03 LAB — CBC
HCT: 35.2 % — ABNORMAL LOW (ref 39.0–52.0)
HCT: 30.9 % — ABNORMAL LOW (ref 39.0–52.0)
HEMOGLOBIN: 10.7 g/dL — AB (ref 13.0–17.0)
HEMOGLOBIN: 9.6 g/dL — AB (ref 13.0–17.0)
MCH: 27 pg (ref 26.0–34.0)
MCH: 27.7 pg (ref 26.0–34.0)
MCHC: 30.4 g/dL (ref 30.0–36.0)
MCHC: 31.1 g/dL (ref 30.0–36.0)
MCV: 88.7 fL (ref 78.0–100.0)
MCV: 89 fL (ref 78.0–100.0)
Platelets: 107 10*3/uL — ABNORMAL LOW (ref 150–400)
Platelets: 78 10*3/uL — ABNORMAL LOW (ref 150–400)
RBC: 3.97 MIL/uL — AB (ref 4.22–5.81)
RBC: 3.47 MIL/uL — ABNORMAL LOW (ref 4.22–5.81)
RDW: 19.1 % — ABNORMAL HIGH (ref 11.5–15.5)
RDW: 19.1 % — AB (ref 11.5–15.5)
WBC: 10 10*3/uL (ref 4.0–10.5)
WBC: 6.4 10*3/uL (ref 4.0–10.5)

## 2014-04-03 LAB — POCT ACTIVATED CLOTTING TIME
Activated Clotting Time: 122 seconds
Activated Clotting Time: 435 seconds
Activated Clotting Time: 110 seconds

## 2014-04-03 LAB — PROTIME-INR
INR: 1.44 (ref 0.00–1.49)
Prothrombin Time: 17.7 seconds — ABNORMAL HIGH (ref 11.6–15.2)

## 2014-04-03 LAB — CREATININE, SERUM
Creatinine, Ser: 0.76 mg/dL (ref 0.50–1.35)
GFR calc non Af Amer: >90 mL/min (ref >90)

## 2014-04-03 LAB — MAGNESIUM: MAGNESIUM: 1.6 mg/dL (ref 1.5–2.5)

## 2014-04-03 LAB — APTT: aPTT: 40 seconds — ABNORMAL HIGH (ref 24–37)

## 2014-04-03 SURGERY — IMPLANTATION, AORTIC VALVE, TRANSCATHETER, FEMORAL APPROACH
Anesthesia: General | Site: Chest

## 2014-04-03 MED ORDER — PROPOFOL 10 MG/ML IV BOLUS
INTRAVENOUS | Status: DC | PRN
  Administered 2014-04-03 (×2): 50 mg via INTRAVENOUS

## 2014-04-03 MED ORDER — FUROSEMIDE 10 MG/ML IJ SOLN
40.0000 mg | Freq: Once | INTRAMUSCULAR | Status: AC
  Administered 2014-04-03: 40 mg via INTRAVENOUS

## 2014-04-03 MED ORDER — ALBUTEROL SULFATE (2.5 MG/3ML) 0.083% IN NEBU
INHALATION_SOLUTION | RESPIRATORY_TRACT | Status: AC
  Administered 2014-04-03: 5 mg
  Filled 2014-04-03: qty 3

## 2014-04-03 MED ORDER — ACETAMINOPHEN 160 MG/5ML PO SOLN
1000.0000 mg | Freq: Four times a day (QID) | ORAL | Status: DC
  Filled 2014-04-03: qty 40

## 2014-04-03 MED ORDER — INSULIN REGULAR BOLUS VIA INFUSION
0.0000 [IU] | Freq: Three times a day (TID) | INTRAVENOUS | Status: DC
  Administered 2014-04-04: 3 [IU] via INTRAVENOUS
  Filled 2014-04-03: qty 10

## 2014-04-03 MED ORDER — SUCCINYLCHOLINE CHLORIDE 20 MG/ML IJ SOLN
INTRAMUSCULAR | Status: AC
  Filled 2014-04-03: qty 1

## 2014-04-03 MED ORDER — LABETALOL HCL 5 MG/ML IV SOLN
INTRAVENOUS | Status: DC | PRN
  Administered 2014-04-03: 10 mg via INTRAVENOUS

## 2014-04-03 MED ORDER — DILTIAZEM HCL 30 MG PO TABS
30.0000 mg | ORAL_TABLET | Freq: Four times a day (QID) | ORAL | Status: DC
  Administered 2014-04-03 – 2014-04-09 (×22): 30 mg via ORAL
  Filled 2014-04-03 (×30): qty 1

## 2014-04-03 MED ORDER — VECURONIUM BROMIDE 10 MG IV SOLR
INTRAVENOUS | Status: DC | PRN
  Administered 2014-04-03: 2 mg via INTRAVENOUS

## 2014-04-03 MED ORDER — CHLORHEXIDINE GLUCONATE 4 % EX LIQD
30.0000 mL | CUTANEOUS | Status: DC
  Filled 2014-04-03: qty 30

## 2014-04-03 MED ORDER — SODIUM CHLORIDE 0.9 % IV SOLN: INTRAVENOUS | Status: DC

## 2014-04-03 MED ORDER — SODIUM CHLORIDE 0.9 % IV SOLN
1.0000 mL/kg/h | INTRAVENOUS | Status: AC
  Administered 2014-04-03: 1 mL/kg/h via INTRAVENOUS

## 2014-04-03 MED ORDER — SODIUM CHLORIDE 0.9 % IV SOLN
INTRAVENOUS | Status: DC
  Administered 2014-04-03: 20 mL/h via INTRAVENOUS

## 2014-04-03 MED ORDER — TRAMADOL HCL 50 MG PO TABS: 50.0000 mg | ORAL_TABLET | ORAL | Status: DC | PRN

## 2014-04-03 MED ORDER — ASPIRIN EC 81 MG PO TBEC
81.0000 mg | DELAYED_RELEASE_TABLET | Freq: Every day | ORAL | Status: DC
  Administered 2014-04-04 – 2014-04-09 (×6): 81 mg via ORAL
  Filled 2014-04-03 (×6): qty 1

## 2014-04-03 MED ORDER — GLYCOPYRROLATE 0.2 MG/ML IJ SOLN
INTRAMUSCULAR | Status: AC
  Filled 2014-04-03: qty 4

## 2014-04-03 MED ORDER — STERILE WATER FOR INJECTION IJ SOLN
INTRAMUSCULAR | Status: AC
  Filled 2014-04-03: qty 10

## 2014-04-03 MED ORDER — HYDROCORTISONE NA SUCCINATE PF 100 MG IJ SOLR
INTRAMUSCULAR | Status: DC | PRN
  Administered 2014-04-03: 100 mg via INTRAVENOUS

## 2014-04-03 MED ORDER — FENTANYL CITRATE 0.05 MG/ML IJ SOLN
INTRAMUSCULAR | Status: AC
  Filled 2014-04-03: qty 5

## 2014-04-03 MED ORDER — EPINEPHRINE HCL 1 MG/ML IJ SOLN
INTRAMUSCULAR | Status: AC
  Filled 2014-04-03: qty 1

## 2014-04-03 MED ORDER — ONDANSETRON HCL 4 MG/2ML IJ SOLN: 4.0000 mg | Freq: Four times a day (QID) | INTRAMUSCULAR | Status: DC | PRN

## 2014-04-03 MED ORDER — HYDROCORTISONE NA SUCCINATE PF 100 MG IJ SOLR
INTRAMUSCULAR | Status: AC
  Filled 2014-04-03: qty 2

## 2014-04-03 MED ORDER — CETYLPYRIDINIUM CHLORIDE 0.05 % MT LIQD
7.0000 mL | Freq: Two times a day (BID) | OROMUCOSAL | Status: DC
  Administered 2014-04-03 – 2014-04-04 (×2): 7 mL via OROMUCOSAL

## 2014-04-03 MED ORDER — NITROGLYCERIN IN D5W 200-5 MCG/ML-% IV SOLN: 0.0000 ug/min | INTRAVENOUS | Status: DC

## 2014-04-03 MED ORDER — BUDESONIDE 0.25 MG/2ML IN SUSP
0.2500 mg | Freq: Two times a day (BID) | RESPIRATORY_TRACT | Status: DC
  Administered 2014-04-03 – 2014-04-09 (×12): 0.25 mg via RESPIRATORY_TRACT
  Filled 2014-04-03 (×15): qty 2

## 2014-04-03 MED ORDER — METOPROLOL TARTRATE 12.5 MG HALF TABLET
12.5000 mg | ORAL_TABLET | Freq: Once | ORAL | Status: DC
  Filled 2014-04-03: qty 1

## 2014-04-03 MED ORDER — SODIUM CHLORIDE 0.9 % IV SOLN
INTRAVENOUS | Status: DC
  Administered 2014-04-03: 0.6 [IU]/h via INTRAVENOUS
  Filled 2014-04-03 (×2): qty 2.5

## 2014-04-03 MED ORDER — NEOSTIGMINE METHYLSULFATE 10 MG/10ML IV SOLN
INTRAVENOUS | Status: DC | PRN
  Administered 2014-04-03: 5 mg via INTRAVENOUS

## 2014-04-03 MED ORDER — HEPARIN SODIUM (PORCINE) 1000 UNIT/ML IJ SOLN
INTRAMUSCULAR | Status: AC
  Filled 2014-04-03: qty 1

## 2014-04-03 MED ORDER — GLYCOPYRROLATE 0.2 MG/ML IJ SOLN
INTRAMUSCULAR | Status: DC | PRN
  Administered 2014-04-03: .8 mg via INTRAVENOUS

## 2014-04-03 MED ORDER — SODIUM CHLORIDE 0.9 % IV SOLN
250.0000 mL | INTRAVENOUS | Status: DC | PRN
  Administered 2014-04-03: 250 mL via INTRAVENOUS

## 2014-04-03 MED ORDER — LACTATED RINGERS IV SOLN: 500.0000 mL | Freq: Once | INTRAVENOUS | Status: AC | PRN

## 2014-04-03 MED ORDER — PROTAMINE SULFATE 10 MG/ML IV SOLN
INTRAVENOUS | Status: AC
  Filled 2014-04-03: qty 5

## 2014-04-03 MED ORDER — OXYCODONE-ACETAMINOPHEN 5-325 MG PO TABS
1.0000 | ORAL_TABLET | ORAL | Status: DC | PRN
  Administered 2014-04-04: 1 via ORAL
  Administered 2014-04-05 (×4): 2 via ORAL
  Administered 2014-04-06: 1 via ORAL
  Administered 2014-04-06 (×2): 2 via ORAL
  Administered 2014-04-07 (×2): 1 via ORAL
  Administered 2014-04-07 – 2014-04-09 (×8): 2 via ORAL
  Filled 2014-04-03: qty 2
  Filled 2014-04-03: qty 1
  Filled 2014-04-03 (×5): qty 2
  Filled 2014-04-03: qty 1
  Filled 2014-04-03 (×4): qty 2
  Filled 2014-04-03 (×2): qty 1
  Filled 2014-04-03 (×4): qty 2
  Filled 2014-04-03: qty 1
  Filled 2014-04-03: qty 2

## 2014-04-03 MED ORDER — VECURONIUM BROMIDE 10 MG IV SOLR
INTRAVENOUS | Status: AC
  Filled 2014-04-03: qty 10

## 2014-04-03 MED ORDER — CLOPIDOGREL BISULFATE 75 MG PO TABS
75.0000 mg | ORAL_TABLET | Freq: Every day | ORAL | Status: DC
  Administered 2014-04-04 – 2014-04-09 (×6): 75 mg via ORAL
  Filled 2014-04-03 (×9): qty 1

## 2014-04-03 MED ORDER — MORPHINE SULFATE 2 MG/ML IJ SOLN: 2.0000 mg | INTRAMUSCULAR | Status: DC | PRN

## 2014-04-03 MED ORDER — SODIUM CHLORIDE 0.9 % IJ SOLN
INTRAMUSCULAR | Status: AC
  Filled 2014-04-03: qty 10

## 2014-04-03 MED ORDER — VANCOMYCIN HCL IN DEXTROSE 1-5 GM/200ML-% IV SOLN
1000.0000 mg | Freq: Once | INTRAVENOUS | Status: AC
  Administered 2014-04-03: 1000 mg via INTRAVENOUS
  Filled 2014-04-03: qty 200

## 2014-04-03 MED ORDER — POTASSIUM CHLORIDE 10 MEQ/50ML IV SOLN
10.0000 meq | INTRAVENOUS | Status: AC | PRN
  Administered 2014-04-03 (×3): 10 meq via INTRAVENOUS

## 2014-04-03 MED ORDER — FENTANYL CITRATE 0.05 MG/ML IJ SOLN
INTRAMUSCULAR | Status: DC | PRN
  Administered 2014-04-03 (×2): 50 ug via INTRAVENOUS

## 2014-04-03 MED ORDER — ROCURONIUM BROMIDE 50 MG/5ML IV SOLN
INTRAVENOUS | Status: AC
  Filled 2014-04-03: qty 1

## 2014-04-03 MED ORDER — LACTATED RINGERS IV SOLN
INTRAVENOUS | Status: DC | PRN
  Administered 2014-04-03 (×3): via INTRAVENOUS

## 2014-04-03 MED ORDER — METOPROLOL TARTRATE 1 MG/ML IV SOLN
2.5000 mg | INTRAVENOUS | Status: DC | PRN
  Administered 2014-04-03 (×2): 2.5 mg via INTRAVENOUS

## 2014-04-03 MED ORDER — ACETAMINOPHEN 160 MG/5ML PO SOLN
650.0000 mg | Freq: Once | ORAL | Status: AC
  Administered 2014-04-03: 650 mg
  Filled 2014-04-03: qty 20.3

## 2014-04-03 MED ORDER — CHLORHEXIDINE GLUCONATE 4 % EX LIQD
60.0000 mL | Freq: Once | CUTANEOUS | Status: DC
  Filled 2014-04-03: qty 60

## 2014-04-03 MED ORDER — OXYCODONE HCL 5 MG PO TABS
5.0000 mg | ORAL_TABLET | ORAL | Status: DC | PRN
  Administered 2014-04-03 – 2014-04-04 (×4): 10 mg via ORAL
  Filled 2014-04-03 (×4): qty 2

## 2014-04-03 MED ORDER — EZETIMIBE 10 MG PO TABS
10.0000 mg | ORAL_TABLET | Freq: Every day | ORAL | Status: DC
Start: 2014-04-03 — End: 2014-04-09
  Administered 2014-04-03 – 2014-04-08 (×6): 10 mg via ORAL
  Filled 2014-04-03 (×8): qty 1

## 2014-04-03 MED ORDER — ROCURONIUM BROMIDE 100 MG/10ML IV SOLN
INTRAVENOUS | Status: DC | PRN
  Administered 2014-04-03: 20 mg via INTRAVENOUS
  Administered 2014-04-03: 10 mg via INTRAVENOUS
  Administered 2014-04-03: 20 mg via INTRAVENOUS
  Administered 2014-04-03: 30 mg via INTRAVENOUS
  Administered 2014-04-03: 20 mg via INTRAVENOUS

## 2014-04-03 MED ORDER — ONDANSETRON HCL 4 MG/2ML IJ SOLN
INTRAMUSCULAR | Status: AC
  Filled 2014-04-03: qty 2

## 2014-04-03 MED ORDER — MIDAZOLAM HCL 2 MG/2ML IJ SOLN
INTRAMUSCULAR | Status: AC
  Filled 2014-04-03: qty 2

## 2014-04-03 MED ORDER — PHENYLEPHRINE HCL 10 MG/ML IJ SOLN
0.0000 ug/min | INTRAMUSCULAR | Status: DC
  Filled 2014-04-03: qty 2

## 2014-04-03 MED ORDER — LIDOCAINE HCL (CARDIAC) 20 MG/ML IV SOLN
INTRAVENOUS | Status: AC
  Filled 2014-04-03: qty 5

## 2014-04-03 MED ORDER — ACETAMINOPHEN 650 MG RE SUPP: 650.0000 mg | Freq: Once | RECTAL | Status: AC

## 2014-04-03 MED ORDER — SODIUM CHLORIDE 0.9 % IV SOLN
0.0125 ug/kg/min | INTRAVENOUS | Status: AC
  Administered 2014-04-03: .1 ug/kg/min via INTRAVENOUS
  Filled 2014-04-03: qty 2000

## 2014-04-03 MED ORDER — DEXTROSE 5 % IV SOLN
1.5000 g | Freq: Two times a day (BID) | INTRAVENOUS | Status: DC
  Administered 2014-04-03 – 2014-04-04 (×2): 1.5 g via INTRAVENOUS
  Filled 2014-04-03 (×3): qty 1.5

## 2014-04-03 MED ORDER — DEXMEDETOMIDINE HCL IN NACL 200 MCG/50ML IV SOLN: 0.1000 ug/kg/h | INTRAVENOUS | Status: DC

## 2014-04-03 MED ORDER — FAMOTIDINE IN NACL 20-0.9 MG/50ML-% IV SOLN
20.0000 mg | Freq: Two times a day (BID) | INTRAVENOUS | Status: DC
  Administered 2014-04-03: 20 mg via INTRAVENOUS

## 2014-04-03 MED ORDER — SODIUM CHLORIDE 0.9 % IJ SOLN
3.0000 mL | Freq: Two times a day (BID) | INTRAMUSCULAR | Status: DC
  Administered 2014-04-03 – 2014-04-04 (×2): 3 mL via INTRAVENOUS

## 2014-04-03 MED ORDER — ARFORMOTEROL TARTRATE 15 MCG/2ML IN NEBU
15.0000 ug | INHALATION_SOLUTION | Freq: Two times a day (BID) | RESPIRATORY_TRACT | Status: DC
  Administered 2014-04-03 – 2014-04-09 (×12): 15 ug via RESPIRATORY_TRACT
  Filled 2014-04-03 (×15): qty 2

## 2014-04-03 MED ORDER — EPHEDRINE SULFATE 50 MG/ML IJ SOLN
INTRAMUSCULAR | Status: AC
  Filled 2014-04-03: qty 1

## 2014-04-03 MED ORDER — ARTIFICIAL TEARS OP OINT
TOPICAL_OINTMENT | OPHTHALMIC | Status: AC
  Filled 2014-04-03: qty 3.5

## 2014-04-03 MED ORDER — PROPOFOL 10 MG/ML IV BOLUS
INTRAVENOUS | Status: AC
  Filled 2014-04-03: qty 20

## 2014-04-03 MED ORDER — ARTIFICIAL TEARS OP OINT
TOPICAL_OINTMENT | OPHTHALMIC | Status: DC | PRN
  Administered 2014-04-03: 1 via OPHTHALMIC

## 2014-04-03 MED ORDER — ONDANSETRON HCL 4 MG/2ML IJ SOLN
INTRAMUSCULAR | Status: DC | PRN
  Administered 2014-04-03: 4 mg via INTRAVENOUS

## 2014-04-03 MED ORDER — ALBUMIN HUMAN 5 % IV SOLN
250.0000 mL | INTRAVENOUS | Status: DC | PRN
  Administered 2014-04-03: 250 mL via INTRAVENOUS

## 2014-04-03 MED ORDER — 0.9 % SODIUM CHLORIDE (POUR BTL) OPTIME
TOPICAL | Status: DC | PRN
  Administered 2014-04-03: 1000 mL

## 2014-04-03 MED ORDER — MIDAZOLAM HCL 5 MG/5ML IJ SOLN
INTRAMUSCULAR | Status: DC | PRN
  Administered 2014-04-03: 1 mg via INTRAVENOUS

## 2014-04-03 MED ORDER — HEPARIN SODIUM (PORCINE) 1000 UNIT/ML IJ SOLN
INTRAMUSCULAR | Status: DC | PRN
  Administered 2014-04-03: 25000 [IU] via INTRAVENOUS

## 2014-04-03 MED ORDER — NEOSTIGMINE METHYLSULFATE 10 MG/10ML IV SOLN
INTRAVENOUS | Status: AC
  Filled 2014-04-03: qty 1

## 2014-04-03 MED ORDER — IPRATROPIUM-ALBUTEROL 18-103 MCG/ACT IN AERO: 2.0000 | INHALATION_SPRAY | Freq: Four times a day (QID) | RESPIRATORY_TRACT | Status: DC | PRN

## 2014-04-03 MED ORDER — IPRATROPIUM-ALBUTEROL 0.5-2.5 (3) MG/3ML IN SOLN
3.0000 mL | Freq: Four times a day (QID) | RESPIRATORY_TRACT | Status: DC | PRN
  Administered 2014-04-06 – 2014-04-07 (×2): 3 mL via RESPIRATORY_TRACT
  Filled 2014-04-03 (×3): qty 3

## 2014-04-03 MED ORDER — SODIUM CHLORIDE 0.9 % IJ SOLN: 3.0000 mL | INTRAMUSCULAR | Status: DC | PRN

## 2014-04-03 MED ORDER — ACETAMINOPHEN 500 MG PO TABS
1000.0000 mg | ORAL_TABLET | Freq: Four times a day (QID) | ORAL | Status: DC
  Administered 2014-04-04 (×3): 1000 mg via ORAL
  Filled 2014-04-03 (×4): qty 2

## 2014-04-03 MED ORDER — HYDROCORTISONE NA SUCCINATE PF 100 MG IJ SOLR
50.0000 mg | Freq: Three times a day (TID) | INTRAMUSCULAR | Status: DC
  Administered 2014-04-03 – 2014-04-04 (×3): 50 mg via INTRAVENOUS
  Filled 2014-04-03 (×5): qty 1

## 2014-04-03 MED ORDER — PROTAMINE SULFATE 10 MG/ML IV SOLN
INTRAVENOUS | Status: DC | PRN
  Administered 2014-04-03 (×3): 50 mg via INTRAVENOUS
  Administered 2014-04-03: 30 mg via INTRAVENOUS
  Administered 2014-04-03: 20 mg via INTRAVENOUS
  Administered 2014-04-03: 50 mg via INTRAVENOUS

## 2014-04-03 MED ORDER — SODIUM CHLORIDE 0.9 % IR SOLN
Status: DC | PRN
  Administered 2014-04-03: 1500 mL

## 2014-04-03 MED ORDER — PANTOPRAZOLE SODIUM 40 MG PO TBEC: 40.0000 mg | DELAYED_RELEASE_TABLET | Freq: Every day | ORAL | Status: DC

## 2014-04-03 MED ORDER — LIDOCAINE HCL (CARDIAC) 20 MG/ML IV SOLN
INTRAVENOUS | Status: DC | PRN
  Administered 2014-04-03: 60 mg via INTRAVENOUS

## 2014-04-03 MED ORDER — MIDAZOLAM HCL 2 MG/2ML IJ SOLN: 2.0000 mg | INTRAMUSCULAR | Status: DC | PRN

## 2014-04-03 MED ORDER — PHENYLEPHRINE 40 MCG/ML (10ML) SYRINGE FOR IV PUSH (FOR BLOOD PRESSURE SUPPORT)
PREFILLED_SYRINGE | INTRAVENOUS | Status: AC
  Filled 2014-04-03: qty 10

## 2014-04-03 MED ORDER — IODIXANOL 320 MG/ML IV SOLN
INTRAVENOUS | Status: DC | PRN
  Administered 2014-04-03: 141.1 mL via INTRAVENOUS

## 2014-04-03 MED ORDER — LORATADINE 10 MG PO TABS
10.0000 mg | ORAL_TABLET | Freq: Every day | ORAL | Status: DC
  Administered 2014-04-04 – 2014-04-09 (×6): 10 mg via ORAL
  Filled 2014-04-03 (×6): qty 1

## 2014-04-03 MED ORDER — METOPROLOL SUCCINATE ER 25 MG PO TB24
25.0000 mg | ORAL_TABLET | Freq: Two times a day (BID) | ORAL | Status: DC
  Administered 2014-04-03 – 2014-04-09 (×11): 25 mg via ORAL
  Filled 2014-04-03 (×14): qty 1

## 2014-04-03 MED ORDER — MORPHINE SULFATE 2 MG/ML IJ SOLN
1.0000 mg | INTRAMUSCULAR | Status: DC | PRN
  Administered 2014-04-03 (×2): 2 mg via INTRAVENOUS
  Filled 2014-04-03 (×2): qty 1

## 2014-04-03 MED ORDER — SODIUM CHLORIDE 0.9 % IV SOLN
INTRAVENOUS | Status: DC | PRN
  Administered 2014-04-03: 11:00:00 via INTRAVENOUS

## 2014-04-03 MED ORDER — METOPROLOL SUCCINATE ER 25 MG PO TB24
25.0000 mg | ORAL_TABLET | Freq: Two times a day (BID) | ORAL | Status: DC
  Filled 2014-04-03: qty 1

## 2014-04-03 MED ORDER — PHENYLEPHRINE HCL 10 MG/ML IJ SOLN
INTRAMUSCULAR | Status: DC | PRN
  Administered 2014-04-03 (×3): 200 ug via INTRAVENOUS

## 2014-04-03 MED FILL — Sodium Chloride IV Soln 0.9%: INTRAVENOUS | Qty: 2000 | Status: AC

## 2014-04-03 SURGICAL SUPPLY — 133 items
ADAPTER CARDIOPLEGIA (MISCELLANEOUS) IMPLANT
ANTEGRADE CPLG (MISCELLANEOUS) IMPLANT
ATRION QL38 ×6 IMPLANT
BAG BANDED W/RUBBER/TAPE 36X54 (MISCELLANEOUS) ×3 IMPLANT
BAG DECANTER FOR FLEXI CONT (MISCELLANEOUS) IMPLANT
BAG SNAP BAND KOVER 36X36 (MISCELLANEOUS) ×6 IMPLANT
BLADE CORE FAN STRYKER (BLADE) ×3 IMPLANT
BLADE OSCILLATING /SAGITTAL (BLADE) IMPLANT
BLADE STERNUM SYSTEM 6 (BLADE) ×3 IMPLANT
BLADE SURG ROTATE 9660 (MISCELLANEOUS) ×3 IMPLANT
CABLE PACING FASLOC BIEGE (MISCELLANEOUS) IMPLANT
CABLE PACING FASLOC BLUE (MISCELLANEOUS) ×3 IMPLANT
CANISTER SUCTION 2500CC (MISCELLANEOUS) ×6 IMPLANT
CANNULA FEM VENOUS REMOTE 22FR (CANNULA) IMPLANT
CANNULA FEMORAL ART 14 SM (MISCELLANEOUS) IMPLANT
CANNULA GUNDRY RCSP 15FR (MISCELLANEOUS) IMPLANT
CANNULA OPTISITE PERFUSION 16F (CANNULA) IMPLANT
CANNULA OPTISITE PERFUSION 18F (CANNULA) IMPLANT
CANNULA SOFTFLOW AORTIC 7M21FR (CANNULA) IMPLANT
CANNULA VENOUS LOW PROF 34X46 (CANNULA) IMPLANT
CATH DIAG EXPO 6F AL2 (CATHETERS) ×3 IMPLANT
CATH DIAG EXPO 6F VENT PIG 145 (CATHETERS) ×6 IMPLANT
CATH HEART VENT LEFT (CATHETERS) IMPLANT
CATH S G BIP PACING (SET/KITS/TRAYS/PACK) ×6 IMPLANT
CATH SOFT-VU 4F 65 STRAIGHT (CATHETERS) ×2 IMPLANT
CATH SOFT-VU STRAIGHT 4F 65CM (CATHETERS) ×1
CLIP TI MEDIUM 24 (CLIP) ×6 IMPLANT
CLIP TI MEDIUM 6 (CLIP) ×3 IMPLANT
CLIP TI WIDE RED SMALL 24 (CLIP) ×3 IMPLANT
CONN ST 1/4X3/8  BEN (MISCELLANEOUS)
CONN ST 1/4X3/8 BEN (MISCELLANEOUS) IMPLANT
CONNECTOR 1/2X3/8X1/2 3 WAY (MISCELLANEOUS)
CONNECTOR 1/2X3/8X1/2 3WAY (MISCELLANEOUS) IMPLANT
COVER BACK TABLE 24X17X13 BIG (DRAPES) ×3 IMPLANT
COVER DOME SNAP 22 D (MISCELLANEOUS) ×3 IMPLANT
COVER MAYO STAND STRL (DRAPES) ×3 IMPLANT
COVER PROBE W GEL 5X96 (DRAPES) IMPLANT
COVER SURGICAL LIGHT HANDLE (MISCELLANEOUS) ×3 IMPLANT
COVER TABLE BACK 60X90 (DRAPES) ×3 IMPLANT
CRADLE DONUT ADULT HEAD (MISCELLANEOUS) ×3 IMPLANT
DERMABOND ADVANCED (GAUZE/BANDAGES/DRESSINGS) ×1
DERMABOND ADVANCED .7 DNX12 (GAUZE/BANDAGES/DRESSINGS) ×2 IMPLANT
DRAIN CHANNEL 28F RND 3/8 FF (WOUND CARE) IMPLANT
DRAIN CHANNEL 32F RND 10.7 FF (WOUND CARE) IMPLANT
DRAPE INCISE IOBAN 66X45 STRL (DRAPES) IMPLANT
DRAPE SLUSH/WARMER DISC (DRAPES) ×3 IMPLANT
DRAPE TABLE COVER HEAVY DUTY (DRAPES) ×3 IMPLANT
DRSG TEGADERM 4X4.75 (GAUZE/BANDAGES/DRESSINGS) ×3 IMPLANT
ELECT BLADE 4.0 EZ CLEAN MEGAD (MISCELLANEOUS) ×3
ELECT REM PT RETURN 9FT ADLT (ELECTROSURGICAL) ×6
ELECTRODE BLDE 4.0 EZ CLN MEGD (MISCELLANEOUS) ×2 IMPLANT
ELECTRODE REM PT RTRN 9FT ADLT (ELECTROSURGICAL) ×4 IMPLANT
FELT TEFLON 6X6 (MISCELLANEOUS) ×3 IMPLANT
FEMORAL VENOUS CANN RAP (CANNULA) IMPLANT
GAUZE SPONGE 4X4 12PLY STRL (GAUZE/BANDAGES/DRESSINGS) ×3 IMPLANT
GLOVE BIO SURGEON STRL SZ 6 (GLOVE) ×9 IMPLANT
GLOVE BIO SURGEON STRL SZ 6.5 (GLOVE) ×9 IMPLANT
GLOVE BIOGEL PI IND STRL 6 (GLOVE) ×6 IMPLANT
GLOVE BIOGEL PI IND STRL 6.5 (GLOVE) ×8 IMPLANT
GLOVE BIOGEL PI INDICATOR 6 (GLOVE) ×3
GLOVE BIOGEL PI INDICATOR 6.5 (GLOVE) ×4
GLOVE ECLIPSE 8.0 STRL XLNG CF (GLOVE) ×6 IMPLANT
GLOVE EUDERMIC 7 POWDERFREE (GLOVE) ×6 IMPLANT
GLOVE ORTHO TXT STRL SZ7.5 (GLOVE) ×6 IMPLANT
GOWN STRL REUS W/ TWL LRG LVL3 (GOWN DISPOSABLE) ×10 IMPLANT
GOWN STRL REUS W/ TWL XL LVL3 (GOWN DISPOSABLE) ×12 IMPLANT
GOWN STRL REUS W/TWL LRG LVL3 (GOWN DISPOSABLE) ×5
GOWN STRL REUS W/TWL XL LVL3 (GOWN DISPOSABLE) ×6
GUIDEWIRE SAF TJ AMPL .035X180 (WIRE) ×3 IMPLANT
GUIDEWIRE SAFE TJ AMPLATZ EXST (WIRE) ×3 IMPLANT
GUIDEWIRE STRAIGHT .035 260CM (WIRE) ×3 IMPLANT
HEMOSTAT POWDER SURGIFOAM 1G (HEMOSTASIS) IMPLANT
INSERT FOGARTY 61MM (MISCELLANEOUS) IMPLANT
INSERT FOGARTY SM (MISCELLANEOUS) ×6 IMPLANT
INSERT FOGARTY XLG (MISCELLANEOUS) IMPLANT
KIT BASIN OR (CUSTOM PROCEDURE TRAY) ×3 IMPLANT
KIT DILATOR VASC 18G NDL (KITS) IMPLANT
KIT HEART LEFT (KITS) ×3 IMPLANT
KIT ROOM TURNOVER OR (KITS) ×3 IMPLANT
KIT SUCTION CATH 14FR (SUCTIONS) ×9 IMPLANT
LEAD PACING MYOCARDI (MISCELLANEOUS) ×3 IMPLANT
LIQUID BAND (GAUZE/BANDAGES/DRESSINGS) ×3 IMPLANT
NEEDLE PERC 18GX7CM (NEEDLE) ×3 IMPLANT
NS IRRIG 1000ML POUR BTL (IV SOLUTION) ×9 IMPLANT
PACK AORTA (CUSTOM PROCEDURE TRAY) ×3 IMPLANT
PAD ARMBOARD 7.5X6 YLW CONV (MISCELLANEOUS) ×6 IMPLANT
PAD ELECT DEFIB RADIOL ZOLL (MISCELLANEOUS) ×3 IMPLANT
SET CANNULATION TOURNIQUET (MISCELLANEOUS) IMPLANT
SHEATH PINNACLE 6F 10CM (SHEATH) ×6 IMPLANT
SPONGE GAUZE 4X4 12PLY STER LF (GAUZE/BANDAGES/DRESSINGS) ×3 IMPLANT
SPONGE LAP 4X18 X RAY DECT (DISPOSABLE) ×3 IMPLANT
STOPCOCK MORSE 400PSI 3WAY (MISCELLANEOUS) ×3 IMPLANT
SUCKER INTRACARDIAC WEIGHTED (SUCKER) ×3 IMPLANT
SUT BONE WAX W31G (SUTURE) IMPLANT
SUT ETHIBOND 2 0 SH (SUTURE)
SUT ETHIBOND 2 0 SH 36X2 (SUTURE) IMPLANT
SUT ETHIBOND X763 2 0 SH 1 (SUTURE) ×3 IMPLANT
SUT GORETEX CV 4 TH 22 36 (SUTURE) ×3 IMPLANT
SUT GORETEX TH-18 36 INCH (SUTURE) ×6 IMPLANT
SUT MNCRL AB 3-0 PS2 18 (SUTURE) ×3 IMPLANT
SUT PDS AB 1 CTX 36 (SUTURE) IMPLANT
SUT PROLENE 2 0 MH 48 (SUTURE) ×6 IMPLANT
SUT PROLENE 3 0 SH1 36 (SUTURE) IMPLANT
SUT PROLENE 4 0 RB 1 (SUTURE) ×1
SUT PROLENE 4-0 RB1 .5 CRCL 36 (SUTURE) ×2 IMPLANT
SUT PROLENE 5 0 C 1 36 (SUTURE) ×6 IMPLANT
SUT PROLENE 6 0 C 1 30 (SUTURE) ×6 IMPLANT
SUT SILK  1 MH (SUTURE) ×1
SUT SILK 1 MH (SUTURE) ×2 IMPLANT
SUT SILK 2 0 (SUTURE) ×1
SUT SILK 2 0 SH CR/8 (SUTURE) ×3 IMPLANT
SUT SILK 2 0 TIES 10X30 (SUTURE) ×3 IMPLANT
SUT SILK 2-0 18XBRD TIE 12 (SUTURE) ×2 IMPLANT
SUT TEM PAC WIRE 2 0 SH (SUTURE) IMPLANT
SUT VIC AB 2-0 CT1 18 (SUTURE) ×6 IMPLANT
SUT VIC AB 2-0 CTX 36 (SUTURE) IMPLANT
SUT VIC AB 3-0 SH 8-18 (SUTURE) ×6 IMPLANT
SYR 30ML LL (SYRINGE) ×6 IMPLANT
SYR 50ML LL SCALE MARK (SYRINGE) ×3 IMPLANT
SYSTEM SAHARA CHEST DRAIN ATS (WOUND CARE) ×3 IMPLANT
TAPE CLOTH SURG 4X10 WHT LF (GAUZE/BANDAGES/DRESSINGS) ×3 IMPLANT
TOWEL OR 17X24 6PK STRL BLUE (TOWEL DISPOSABLE) ×9 IMPLANT
TOWEL OR 17X26 10 PK STRL BLUE (TOWEL DISPOSABLE) ×6 IMPLANT
TRAY FOLEY IC TEMP SENS 14FR (CATHETERS) ×3 IMPLANT
TUBING ART PRESS 72  MALE/FEM (TUBING) ×1
TUBING ART PRESS 72 MALE/FEM (TUBING) ×2 IMPLANT
TUBING HIGH PRESSURE 120CM (CONNECTOR) ×3 IMPLANT
UNDERPAD 30X30 INCONTINENT (UNDERPADS AND DIAPERS) IMPLANT
VALVE HEART TRANSCATH SZ3 29MM (Prosthesis & Implant Heart) ×3 IMPLANT
VENT LEFT HEART 12002 (CATHETERS)
WATER STERILE IRR 1000ML POUR (IV SOLUTION) ×6 IMPLANT
WIRE .035 3MM-J 145CM (WIRE) ×3 IMPLANT
WIRE AMPLATZ SS-J .035X180CM (WIRE) ×3 IMPLANT

## 2014-04-03 NOTE — Op Note (Signed)
CARDIOTHORACIC SURGERY OPERATIVE NOTE  Date of Procedure:  04/03/2014  Preoperative Diagnosis: Severe Aortic Stenosis   Postoperative Diagnosis: Same   Procedure:    Transcatheter Aortic Valve Replacement - Open Left Transfemoral Approach  Edwards Sapien 3 Transcatheter Heart Valve (size 29 mm, model # 9600TFX, serial # [id number])   Co-Surgeons:  Salvatore Decent. Cornelius Moras, MD and Tonny Bollman, MD  Assistants:   Alleen Borne, MD and Verne Carrow, MD  Anesthesiologist:  Marcene Duos, MD  Echocardiographer:  [name], MD  Pre-operative Echo Findings:  Severe aortic stenosis  Mild left ventricular systolic dysfunction  Mild mitral regurgitation  Post-operative Echo Findings:  Trace paravalvular leak  Stable left ventricular systolic function      DETAILS OF THE OPERATIVE PROCEDURE  The majority of the procedure is documented separately in a procedure note by Dr. Excell Seltzer.   TRANSFEMORAL ACCESS:   A longitudinal incision is made in the left groin immediately over the common femoral artery. The subcutaneous tissues are divided with electrocautery and the anterior surface of the common femoral artery is identified. The common femoral artery was severely diseased with atherosclerotic plaque.  The inguinal ligament was partially divided to facilitate dissection proximally to the external iliac artery.  Sharp dissection is utilized to free up the artery proximally and distally and the vessel is encircled with a vessel loop.  A pair of CV-4 Gore-tex sutures are place as diamond-shaped purse-strings on the anterior surface of the femoral artery.  The patient is heparinized systemically and ACT verified > 250 seconds.  The common femoral artery is punctured using an 18 gauge needle and a soft J-tipped guidewire is passed into the common iliac artery under fluoroscopic guidance.  A 6 Fr straight diagnostic catheter is placed over the guidewire and the guidewire is removed.   An Amplatz super stiff guidewire is passed through the sheath into the descending thoracic aorta and the introducing diagnostic catheter is removed.  Serial dilators are passed over the guidewire under continuous fluoroscopic guidance, making certain that each dilator passes easily all of the way into the distal abdominal aorta.  A 16 Fr Commander introducer sheath is passed over the guidewire into the abdominal aorta.  The introducing dilator is removed, the sheath is flushed with heparinized saline, and the sheath is secured to the skin.    FEMORAL SHEATH REMOVAL AND ARTERIAL CLOSURE:  After the completion of successful valve deployment as documented separately by Dr. Excell Seltzer, the femoral artery sheath is removed and the arteriotomy is closed using the previously placed Gore-tex purse-string sutures. Once the repair has been completed protamine was administered to reverse the anticoagulation. A digitally-subtracted arteriogram is obtained from just above the aortic bifurcation to below the arteriotomy to confirm the integrity of the vascular repair.  The incision is irrigated with saline solution and subsequently closed in multiple layers using absorbable suture.  The skin incision is closed using a subcuticular skin closure.     Salvatore Decent. Cornelius Moras MD 04/03/2014 11:54 AM

## 2014-04-03 NOTE — Progress Notes (Signed)
RT note- placed on Bipap forslight hypoventilation. ABG done, nebulizer given with albuterol for wheezing. Continue to monitor.

## 2014-04-03 NOTE — Transfer of Care (Addendum)
Immediate Anesthesia Transfer of Care Note  Patient: [name]  Procedure(s) Performed: Procedure(s): TRANSCATHETER AORTIC VALVE REPLACEMENT, TRANSFEMORAL (N/A) INTRAOPERATIVE TRANSESOPHAGEAL ECHOCARDIOGRAM (N/A)  Patient Location: SICU  Anesthesia Type:General  Level of Consciousness: awake, alert  and oriented  Airway & Oxygen Therapy: Patient Spontanous Breathing, Patient connected to face mask oxygen and receiving treatment from RT per MD order  Post-op Assessment: Report given to PACU RN  Post vital signs: Reviewed  Complications: No apparent anesthesia complications and respiratory complications

## 2014-04-03 NOTE — Progress Notes (Signed)
Utilization Review Completed.  

## 2014-04-03 NOTE — Anesthesia Procedure Notes (Addendum)
Anesthesia Procedure Note PA catheter:  Routine monitors. Timeout, sterile prep, drape, FBP R neck.  Trendelenburg position.  1% Lido local, finder and trocar RIJ 1st pass with US guidance.  Cordis placed over J wire. PA catheter in easily.  Sterile dressing applied.  Patient tolerated well, VSS.   Jenita Seashore, MD  08:00-08:12 Procedure Name: Intubation Date/Time: 04/03/2014 9:00 AM Performed by: Barrington Ellison Pre-anesthesia Checklist: Patient identified, Emergency Drugs available, Suction available, Patient being monitored and Timeout performed Patient Re-evaluated:Patient Re-evaluated prior to inductionOxygen Delivery Method: Circle system utilized Preoxygenation: Pre-oxygenation with 100% oxygen Intubation Type: IV induction Ventilation: Mask ventilation without difficulty and Two handed mask ventilation required Laryngoscope Size: Mac and 4 Grade View: Grade I Tube type: Oral Tube size: 8.0 mm Number of attempts: 1 Airway Equipment and Method: Stylet Placement Confirmation: ETT inserted through vocal cords under direct vision,  breath sounds checked- equal and bilateral and positive ETCO2 Secured at: 23 cm Tube secured with: Tape Dental Injury: Teeth and Oropharynx as per pre-operative assessment

## 2014-04-03 NOTE — Interval H&P Note (Signed)
History and Physical Interval Note:  04/03/2014 7:54 AM  [name]  has presented today for surgery, with the diagnosis of SEVERE AS  The various methods of treatment have been discussed with the patient and family. After consideration of risks, benefits and other options for treatment, the patient has consented to  Procedure(s): TRANSCATHETER AORTIC VALVE REPLACEMENT, TRANSFEMORAL, possible transapical (N/A) INTRAOPERATIVE TRANSESOPHAGEAL ECHOCARDIOGRAM (N/A) as a surgical intervention .  The patient's history has been reviewed, patient examined, no change in status, stable for surgery.  I have reviewed the patient's chart and labs.  Questions were answered to the patient's satisfaction.     [name],[name]

## 2014-04-03 NOTE — Op Note (Signed)
[facility]  TAVR OPERATIVE NOTE   Date of Procedure:  04/03/2014  Preoperative Diagnosis: Severe Aortic Stenosis   Postoperative Diagnosis: Same   Procedure:    Transcatheter Aortic Valve Replacement - Transfemoral Approach  Edwards Sapien 3 THV (size 29 mm, model # [id number], serial # [id number])   Co-Surgeons:  Salvatore Decent. Cornelius Moras, MD and Tonny Bollman, MD  Assistants:   Alleen Borne, MD and Verne Carrow, MD  Anesthesiologist:  Dr Sampson Goon  Echocardiographer:  Dr Delton See  Pre-operative Echo Findings:  Severe aortic stenosis  Normal left ventricular systolic function  Mild MR  Post-operative Echo Findings:  No paravalvular leak  Normal left ventricular systolic function  Mild (unchanged) MR  BRIEF CLINICAL NOTE AND INDICATIONS FOR SURGERY  68 year-old gentleman presenting for TAVR via a transfemoral approach. He has been thoroughly evaluated by the Multidisciplinary Heart Valve Team as documented elsewhere and is felt to be an appropriate candidate for TAVR for treatment of Stage D, Severe Symptomatic AS.  During the course of the patient's preoperative work up they have been evaluated comprehensively by a multidisciplinary team of specialists coordinated through the Multidisciplinary Heart Valve Clinic in the [facility].  They have been demonstrated to suffer from symptomatic severe aortic stenosis as noted above. The patient has been counseled extensively as to the relative risks and benefits of all options for the treatment of severe aortic stenosis including long term medical therapy, conventional surgery for aortic valve replacement, and transcatheter aortic valve replacement.  The patient has been independently evaluated by two cardiac surgeons including Dr Cornelius Moras and Dr. Laneta Simmers, and they are felt to be at high risk for conventional surgical aortic valve replacement based upon a predicted risk of mortality using the  [employer] risk calculator of 14.4%%. Both surgeons indicated the patient would be a poor candidate for conventional surgery (predicted risk of mortality >15% and/or predicted risk of permanent morbidity >50%) because of comorbidities including chronic steroid-dependence, previous CABG, morbid obesity, chronic diastolic heart failure, liver cirrhosis, and severe O2-dependent COPD.   Based upon review of all of the patient's preoperative diagnostic tests they are felt to be candidate for transcatheter aortic valve replacement using the transfemoral approach as an alternative to high risk conventional surgery.    Following the decision to proceed with transcatheter aortic valve replacement, a discussion has been held regarding what types of management strategies would be attempted intraoperatively in the event of life-threatening complications, including whether or not the patient would be considered a candidate for the use of cardiopulmonary bypass and/or conversion to open sternotomy for attempted surgical intervention.  The patient has been advised of a variety of complications that might develop peculiar to this approach including but not limited to risks of death, stroke, paravalvular leak, aortic dissection or other major vascular complications, aortic annulus rupture, device embolization, cardiac rupture or perforation, acute myocardial infarction, arrhythmia, heart block or bradycardia requiring permanent pacemaker placement, congestive heart failure, respiratory failure, renal failure, pneumonia, infection, other late complications related to structural valve deterioration or migration, or other complications that might ultimately cause a temporary or permanent loss of functional independence or other long term morbidity.  The patient provides full informed consent for the procedure as described and all questions were answered preoperatively.    DETAILS OF THE OPERATIVE  PROCEDURE  PREPARATION:    The patient is brought to the operating room on the above mentioned date and central monitoring was established  by the anesthesia team including placement of Swan-Ganz catheter and radial arterial line. The patient is placed in the supine position on the operating table.  Intravenous antibiotics are administered. General endotracheal anesthesia is induced uneventfully. A Foley catheter is placed.  Baseline transesophageal echocardiogram was performed. The patient's chest, abdomen, both groins, and both lower extremities are prepared and draped in a sterile manner. A time out procedure is performed.   PERIPHERAL ACCESS:    Using the modified Seldinger technique, femoral arterial and venous access was obtained with placement of 6 Fr sheaths on the right side.  A pigtail diagnostic catheter was passed through the right femoral arterial sheath under fluoroscopic guidance into the aortic root.  A temporary transvenous pacemaker catheter was passed through the right femoral venous sheath under fluoroscopic guidance into the right ventricle.  The pacemaker was tested to ensure stable lead placement and pacemaker capture. Aortic root angiography was performed in order to determine the optimal angiographic angle for valve deployment.   TRANSFEMORAL ACCESS:   A left femoral arterial cutdown was performed by Dr Cornelius Moras. Please see his separate operative note for details. The patient was heparinized systemically and ACT verified > 250 seconds.    A 16 Fr transfemoral E-sheath was introduced into the left femoral artery after progressively dilating over an Amplatz superstiff wire. An AL-1 catheter was used to direct a straight-tip exchange length wire across the native aortic valve into the left ventricle. This was exchanged out for a pigtail catheter and position was confirmed in the LV apex.  Simultaneous LV and Ao pressures were recorded.  The pigtail catheter was then exchanged for  an Amplatz Extra-stiff wire in the LV apex. At that point, BAV was performed using a 25 mm valvuloplasty balloon.  Once optimal position was achieved, BAV was done under rapid ventricular pacing at 180 bpm. The patient recovered well hemodynamically.   TRANSCATHETER HEART VALVE DEPLOYMENT:  An Edwards Sapien 3 THV (size 29 mm) was prepared and crimped per manufacturer's guidelines, and the proper orientation of the valve is confirmed on the [employer] delivery system. The valve was advanced through the introducer sheath using normal technique until in an appropriate position in the abdominal aorta beyond the sheath tip. The balloon was then retracted and using the fine-tuning wheel was centered on the valve. The valve was then advanced across the aortic arch using appropriate flexion of the catheter. The valve was carefully positioned across the aortic valve annulus. The Commander catheter was retracted using normal technique. Once final position of the valve has been confirmed by angiographic assessment, the valve is deployed while temporarily holding ventilation and during rapid ventricular pacing to maintain systolic blood pressure < 50 mmHg and pulse pressure < 10 mmHg. The balloon inflation is held for >3 seconds after reaching full deployment volume. Once the balloon has fully deflated the balloon is retracted into the ascending aorta and valve function is assessed using TEE. There is felt to be no paravalvular leak and trace central aortic insufficiency.  The patient's hemodynamic recovery following valve deployment is good.  The deployment balloon and guidewire are both removed. Echo demostrated acceptable post-procedural gradients, stable mitral valve function, and trace central AI.   PROCEDURE COMPLETION:  The sheath was then removed and arteriotomy repaired by Dr Cornelius Moras. Please see his separate report for details. Distal abdominal aortography was performed to evaluate for any arterial injury  related to the procedure.  There was no evidence dissection, perforation, or other vascular injury  in the abdominal aorta, iliac artery, or femoral artery.  Protamine was administered once femoral arterial repair was complete. The temporary pacemaker, pigtail catheters and femoral sheaths were removed with manual pressure used for hemostasis.   The patient tolerated the procedure well and is transported to the surgical intensive care in stable condition. There were no immediate intraoperative complications. All sponge instrument and needle counts are verified correct at completion of the operation.    The patient received a total of 141 mL of visipaque contrast during the procedure.  Tonny Bollman MD 04/03/2014 11:41 AM

## 2014-04-03 NOTE — OR Nursing (Signed)
Catheter sheaths in right groin pulled by Danie Binder times two. Site held for 20 minutes apiece.

## 2014-04-03 NOTE — Anesthesia Preprocedure Evaluation (Addendum)
Anesthesia Evaluation  Patient identified by MRN, date of birth, ID band Patient awake    Reviewed: Allergy & Precautions, H&P , NPO status , Patient's Chart, lab work & pertinent test results, reviewed documented beta blocker date and time   Airway Mallampati: II  TM Distance: >3 FB Neck ROM: Full    Dental  (+) Edentulous Upper, Partial Lower, Dental Advisory Given   Pulmonary COPD COPD inhaler, former smoker,          Cardiovascular hypertension, Pt. on home beta blockers + CAD, + Cardiac Stents, + CABG and +CHF + Valvular Problems/Murmurs AS     Neuro/Psych negative neurological ROS     GI/Hepatic Neg liver ROS, GERD-  ,  Endo/Other  diabetes, Type 2, Insulin DependentMorbid obesity  Renal/GU negative Renal ROS     Musculoskeletal  (+) Arthritis -,   Abdominal   Peds  Hematology  (+) anemia ,   Anesthesia Other Findings   Reproductive/Obstetrics                            Anesthesia Physical Anesthesia Plan  ASA: IV  Anesthesia Plan: General   Post-op Pain Management:    Induction: Intravenous  Airway Management Planned: Oral ETT and Double Lumen EBT  Additional Equipment: Arterial line, CVP, TEE, PA Cath and Ultrasound Guidance Line Placement  Intra-op Plan:   Post-operative Plan: Extubation in OR  Informed Consent: I have reviewed the patients History and Physical, chart, labs and discussed the procedure including the risks, benefits and alternatives for the proposed anesthesia with the patient or authorized representative who has indicated his/her understanding and acceptance.   Dental advisory given  Plan Discussed with: CRNA and Surgeon  Anesthesia Plan Comments:         Anesthesia Quick Evaluation

## 2014-04-03 NOTE — Anesthesia Postprocedure Evaluation (Signed)
  Anesthesia Post-op Note  Patient: [name]  Procedure(s) Performed: Procedure(s): TRANSCATHETER AORTIC VALVE REPLACEMENT, TRANSFEMORAL (N/A) INTRAOPERATIVE TRANSESOPHAGEAL ECHOCARDIOGRAM (N/A)  Patient Location: SICU  Anesthesia Type:General  Level of Consciousness: awake, alert  and oriented  Airway and Oxygen Therapy: Patient Spontanous Breathing and Patient connected to face mask oxygen  Post-op Pain: none  Post-op Assessment: Post-op Vital signs reviewed, Patient's Cardiovascular Status Stable and Respiratory Function Stable  Post-op Vital Signs: Reviewed and stable  Last Vitals:  Filed Vitals:   04/03/14 1032  BP:   Pulse: 32  Temp:   Resp:     Complications: No apparent anesthesia complications and respiratory complications

## 2014-04-03 NOTE — Plan of Care (Signed)
Problem: Phase I Progression Outcomes Goal: Pain controlled with appropriate interventions Outcome: Completed/Met Date Met:  04/03/14 Goal: Voiding-avoid urinary catheter unless indicated Outcome: Progressing Goal: Hemodynamically stable Outcome: Completed/Met Date Met:  04/03/14 Goal: Distal pulses equal to baseline Outcome: Completed/Met Date Met:  04/03/14 Goal: Vascular site scale level 0 - I Vascular Site Scale Level 0: No bruising/bleeding/hematoma Level I (Mild): Bruising/Ecchymosis, minimal bleeding/ooozing, palpable hematoma < 3 cm Level II (Moderate): Bleeding not affecting hemodynamic parameters, pseudoaneurysm, palpable hematoma > 3 cm Level III (Severe) Bleeding which affects hemodynamic parameters or retroperitoneal hemorrhage  Outcome: Completed/Met Date Met:  04/03/14

## 2014-04-03 NOTE — Progress Notes (Signed)
Pm round:  Pt doing ok without chest pain or shortness of breath. Ventricular rate has been elevated despite IV lopressor. Otherwise hemodynamics look good.   Will add Toprol XL dose to be given now and add oral cardizem which he was taking at home.   Tonny Bollman 04/03/2014 4:26 PM

## 2014-04-03 NOTE — Progress Notes (Signed)
Echocardiogram Echocardiogram Transesophageal has been performed.  [name] 04/03/2014, 12:11 PM

## 2014-04-03 NOTE — Progress Notes (Signed)
Pt takes Lopressor (included in home meds) and took 25 mg PO prior to arrival to hospital; therefore, 12.5 mg not given.

## 2014-04-04 ENCOUNTER — Ambulatory Visit: Payer: Medicare Other | Admitting: Cardiovascular Disease

## 2014-04-04 ENCOUNTER — Inpatient Hospital Stay (HOSPITAL_COMMUNITY): Payer: Medicare Other

## 2014-04-04 DIAGNOSIS — I350 Nonrheumatic aortic (valve) stenosis: Secondary | ICD-10-CM

## 2014-04-04 DIAGNOSIS — Z954 Presence of other heart-valve replacement: Secondary | ICD-10-CM

## 2014-04-04 LAB — CBC
HEMATOCRIT: 31.3 % — AB (ref 39.0–52.0)
Hemoglobin: 9.6 g/dL — ABNORMAL LOW (ref 13.0–17.0)
MCH: 27.6 pg (ref 26.0–34.0)
MCHC: 30.7 g/dL (ref 30.0–36.0)
MCV: 89.9 fL (ref 78.0–100.0)
Platelets: 79 10*3/uL — ABNORMAL LOW (ref 150–400)
RBC: 3.48 MIL/uL — ABNORMAL LOW (ref 4.22–5.81)
RDW: 19.3 % — AB (ref 11.5–15.5)
WBC: 7.2 10*3/uL (ref 4.0–10.5)

## 2014-04-04 LAB — GLUCOSE, CAPILLARY
GLUCOSE-CAPILLARY: 115 mg/dL — AB (ref 70–99)
GLUCOSE-CAPILLARY: 126 mg/dL — AB (ref 70–99)
GLUCOSE-CAPILLARY: 204 mg/dL — AB (ref 70–99)
Glucose-Capillary: 103 mg/dL — ABNORMAL HIGH (ref 70–99)
Glucose-Capillary: 104 mg/dL — ABNORMAL HIGH (ref 70–99)
Glucose-Capillary: 107 mg/dL — ABNORMAL HIGH (ref 70–99)
Glucose-Capillary: 120 mg/dL — ABNORMAL HIGH (ref 70–99)
Glucose-Capillary: 142 mg/dL — ABNORMAL HIGH (ref 70–99)
Glucose-Capillary: 148 mg/dL — ABNORMAL HIGH (ref 70–99)
Glucose-Capillary: 152 mg/dL — ABNORMAL HIGH (ref 70–99)
Glucose-Capillary: 157 mg/dL — ABNORMAL HIGH (ref 70–99)
Glucose-Capillary: 171 mg/dL — ABNORMAL HIGH (ref 70–99)
Glucose-Capillary: 310 mg/dL — ABNORMAL HIGH (ref 70–99)

## 2014-04-04 LAB — BASIC METABOLIC PANEL
Anion gap: 10 (ref 5–15)
BUN: 13 mg/dL (ref 6–23)
CO2: 26 mEq/L (ref 19–32)
Calcium: 8.5 mg/dL (ref 8.4–10.5)
Chloride: 102 mEq/L (ref 96–112)
Creatinine, Ser: 0.77 mg/dL (ref 0.50–1.35)
Glucose, Bld: 132 mg/dL — ABNORMAL HIGH (ref 70–99)
Potassium: 4.4 mEq/L (ref 3.7–5.3)
SODIUM: 138 meq/L (ref 137–147)

## 2014-04-04 LAB — MAGNESIUM: Magnesium: 1.7 mg/dL (ref 1.5–2.5)

## 2014-04-04 MED ORDER — BISACODYL 5 MG PO TBEC
10.0000 mg | DELAYED_RELEASE_TABLET | Freq: Every day | ORAL | Status: DC | PRN
  Administered 2014-04-04: 10 mg via ORAL
  Filled 2014-04-04: qty 2

## 2014-04-04 MED ORDER — ONDANSETRON HCL 4 MG/2ML IJ SOLN: 4.0000 mg | Freq: Four times a day (QID) | INTRAMUSCULAR | Status: DC | PRN

## 2014-04-04 MED ORDER — PREDNISONE 5 MG PO TABS
5.0000 mg | ORAL_TABLET | Freq: Every day | ORAL | Status: DC
  Administered 2014-04-05 – 2014-04-09 (×5): 5 mg via ORAL
  Filled 2014-04-04 (×8): qty 1

## 2014-04-04 MED ORDER — DOCUSATE SODIUM 100 MG PO CAPS
200.0000 mg | ORAL_CAPSULE | Freq: Every day | ORAL | Status: DC
  Administered 2014-04-05 – 2014-04-09 (×5): 200 mg via ORAL
  Filled 2014-04-04 (×5): qty 2

## 2014-04-04 MED ORDER — ACETAMINOPHEN 325 MG PO TABS: 650.0000 mg | ORAL_TABLET | Freq: Four times a day (QID) | ORAL | Status: DC | PRN

## 2014-04-04 MED ORDER — SODIUM CHLORIDE 0.9 % IV SOLN: 250.0000 mL | INTRAVENOUS | Status: DC | PRN

## 2014-04-04 MED ORDER — INSULIN DETEMIR 100 UNIT/ML ~~LOC~~ SOLN
40.0000 [IU] | Freq: Two times a day (BID) | SUBCUTANEOUS | Status: DC
  Administered 2014-04-04 – 2014-04-09 (×11): 40 [IU] via SUBCUTANEOUS
  Filled 2014-04-04 (×12): qty 0.4

## 2014-04-04 MED ORDER — ONDANSETRON HCL 4 MG PO TABS: 4.0000 mg | ORAL_TABLET | Freq: Four times a day (QID) | ORAL | Status: DC | PRN

## 2014-04-04 MED ORDER — SODIUM CHLORIDE 0.9 % IJ SOLN
3.0000 mL | Freq: Two times a day (BID) | INTRAMUSCULAR | Status: DC
  Administered 2014-04-04 – 2014-04-09 (×10): 3 mL via INTRAVENOUS

## 2014-04-04 MED ORDER — SODIUM CHLORIDE 0.9 % IJ SOLN: 3.0000 mL | INTRAMUSCULAR | Status: DC | PRN

## 2014-04-04 MED ORDER — TRAMADOL HCL 50 MG PO TABS
50.0000 mg | ORAL_TABLET | ORAL | Status: DC | PRN
Start: 2014-04-04 — End: 2014-04-09
  Filled 2014-04-04: qty 2

## 2014-04-04 MED ORDER — BISACODYL 10 MG RE SUPP: 10.0000 mg | Freq: Every day | RECTAL | Status: DC | PRN

## 2014-04-04 MED ORDER — INSULIN ASPART 100 UNIT/ML ~~LOC~~ SOLN
0.0000 [IU] | SUBCUTANEOUS | Status: DC
  Administered 2014-04-04: 8 [IU] via SUBCUTANEOUS
  Administered 2014-04-04: 16 [IU] via SUBCUTANEOUS
  Administered 2014-04-05: 4 [IU] via SUBCUTANEOUS

## 2014-04-04 MED ORDER — FUROSEMIDE 80 MG PO TABS
80.0000 mg | ORAL_TABLET | Freq: Every day | ORAL | Status: DC
  Administered 2014-04-04: 80 mg via ORAL
  Filled 2014-04-04 (×2): qty 1

## 2014-04-04 MED ORDER — MOVING RIGHT ALONG BOOK
Freq: Once | Status: DC
  Filled 2014-04-04: qty 1

## 2014-04-04 NOTE — Progress Notes (Addendum)
SUBJECTIVE: Pain left groin. No chest pain. Breathing is better.   Tele: atrial fib, rate 70-80  BP 107/48 mmHg  Pulse 59  Temp(Src) 98.7 F (37.1 C) (Oral)  Resp 17  Ht 6' (1.829 m)  Wt 286 lb 13.1 oz (130.1 kg)  BMI 38.89 kg/m2  SpO2 98%  Intake/Output Summary (Last 24 hours) at 04/04/14 6834 Last data filed at 04/04/14 0700  Gross per 24 hour  Intake 4548.06 ml  Output   1975 ml  Net 2573.06 ml    PHYSICAL EXAM General: Well developed, well nourished, in no acute distress. Alert and oriented x 3.  Psych:  Good affect, responds appropriately Neck: No JVD. No masses noted.  Lungs: Clear bilaterally with no wheezes or rhonci noted.  Heart: irreg irreg with no murmurs noted. Abdomen: Bowel sounds are present. Soft, non-tender.  Extremities: Trace bilateral lower extremity edema.   LABS: Basic Metabolic Panel:  Recent Labs  19/62/22 1318  04/03/14 2000 04/03/14 2019 04/04/14 0400  NA 137  < >  --  139 138  K 4.2  < >  --  4.9 4.4  CL 100  < >  --  102 102  CO2 21  --   --   --  26  GLUCOSE 44*  < >  --  141* 132*  BUN 14  < >  --  12 13  CREATININE 0.79  < > 0.76 0.80 0.77  CALCIUM 9.4  --   --   --  8.5  MG  --   --  1.6  --  1.7  < > = values in this interval not displayed. CBC:  Recent Labs  04/03/14 2000 04/03/14 2019 04/04/14 0400  WBC 6.4  --  7.2  HGB 9.6* 10.9* 9.6*  HCT 30.9* 32.0* 31.3*  MCV 89.0  --  89.9  PLT 78*  --  79*    Current Meds: . acetaminophen  1,000 mg Oral 4 times per day   Or  . acetaminophen (TYLENOL) oral liquid 160 mg/5 mL  1,000 mg Per Tube 4 times per day  . antiseptic oral rinse  7 mL Mouth Rinse BID  . arformoterol  15 mcg Nebulization Q12H  . aspirin EC  81 mg Oral Daily  . budesonide  0.25 mg Nebulization BID  . cefUROXime (ZINACEF)  IV  1.5 g Intravenous Q12H  . clopidogrel  75 mg Oral Q breakfast  . diltiazem  30 mg Oral 4 times per day  . ezetimibe  10 mg Oral QHS  . famotidine (PEPCID) IV  20 mg  Intravenous Q12H  . hydrocortisone sod succinate (SOLU-CORTEF) inj  50 mg Intravenous Q8H  . insulin detemir  40 Units Subcutaneous BID  . insulin regular  0-10 Units Intravenous TID WC  . loratadine  10 mg Oral Daily  . metoprolol succinate  25 mg Oral BID  . [START ON 04/05/2014] pantoprazole  40 mg Oral Daily  . sodium chloride  3 mL Intravenous Q12H    ASSESSMENT AND PLAN:  1. Severe aortic valve stenosis: POD#1 s/p TAVR. Doing well. Echo pending today. He is on ASA and Plavix.    2. Atrial fib/flutter: Chronic, persistent. Will continue ASA/Plavix. Will not start coumadin. Rate is now controlled on Toprol and Diltiazem. He is on diltiazem 30 mg po BID at home. May benefit from long acting Cardizem.    3. DM: Stop insulin drip today. Plans as outlined per Dr. Laneta Simmers.  4. Transfer to telemetry today. Possible d/c home tomorrow.   [name]  12/3/20159:03 AM

## 2014-04-04 NOTE — Progress Notes (Signed)
1 Day Post-Op Procedure(s) (LRB): TRANSCATHETER AORTIC VALVE REPLACEMENT, TRANSFEMORAL (N/A) INTRAOPERATIVE TRANSESOPHAGEAL ECHOCARDIOGRAM (N/A) Subjective:  No complaints. Says he is ready to go home  Objective: Vital signs in last 24 hours: Temp:  [97.8 F (36.6 C)-99.1 F (37.3 C)] 98.7 F (37.1 C) (12/03 0738) Pulse Rate:  [32-139] 59 (12/03 0700) Cardiac Rhythm:  [-] Atrial fibrillation (12/03 0740) Resp:  [11-38] 17 (12/03 0700) BP: (91-114)/(40-82) 107/48 mmHg (12/03 0700) SpO2:  [92 %-100 %] 98 % (12/03 0730) Arterial Line BP: (96-140)/(46-71) 115/54 mmHg (12/03 0700) Weight:  [128 kg (282 lb 3 oz)-130.1 kg (286 lb 13.1 oz)] 130.1 kg (286 lb 13.1 oz) (12/03 0500)  Hemodynamic parameters for last 24 hours: PAP: (30-51)/(10-34) 44/25 mmHg CO:  [5 L/min-6.9 L/min] 5 L/min CI:  [2 L/min/m2-2.8 L/min/m2] 2 L/min/m2  Intake/Output from previous day: 12/02 0701 - 12/03 0700 In: 5048.1 [P.O.:180; I.V.:3918.1; Blood:200; IV Piggyback:750] Out: 1975 [Urine:1575; Blood:400] Intake/Output this shift:    General appearance: alert and cooperative Neurologic: intact Heart: irregularly irregular rhythm Lungs: clear to auscultation bilaterally Abdomen: soft, non-tender; bowel sounds normal; no masses,  no organomegaly Extremities: extremities normal, atraumatic, no cyanosis or edema Wound: left groin wound ok  Lab Results:  Recent Labs  04/03/14 2000 04/03/14 2019 04/04/14 0400  WBC 6.4  --  7.2  HGB 9.6* 10.9* 9.6*  HCT 30.9* 32.0* 31.3*  PLT 78*  --  79*   BMET:  Recent Labs  04/01/14 1318  04/03/14 2019 04/04/14 0400  NA 137  < > 139 138  K 4.2  < > 4.9 4.4  CL 100  < > 102 102  CO2 21  --   --  26  GLUCOSE 44*  < > 141* 132*  BUN 14  < > 12 13  CREATININE 0.79  < > 0.80 0.77  CALCIUM 9.4  --   --  8.5  < > = values in this interval not displayed.  PT/INR:  Recent Labs  04/03/14 1230  LABPROT 17.7*  INR 1.44   ABG    Component Value Date/Time   PHART 7.383 04/03/2014 1406   HCO3 29.2* 04/03/2014 1406   TCO2 24 04/03/2014 2019   ACIDBASEDEF 0.0 12/27/2008 1412   O2SAT 98.0 04/03/2014 1406   CBG (last 3)   Recent Labs  04/04/14 0306 04/04/14 0415 04/04/14 0607  GLUCAP 104* 115* 126*    Assessment/Plan: S/P Procedure(s) (LRB): TRANSCATHETER AORTIC VALVE REPLACEMENT, TRANSFEMORAL (N/A) INTRAOPERATIVE TRANSESOPHAGEAL ECHOCARDIOGRAM (N/A)  Chronic atrial flutter. Plan to resume aspirin and plavix.  Mobilize Diabetes control: still on insulin drip. Start Levemir and SSI. Resume 70/30 and metformin at discharge. Plan for transfer to step-down: see transfer orders 2D echo today  Home tomorrow if ambulating ok   LOS: 1 day    BARTLE,BRYAN K 04/04/2014

## 2014-04-04 NOTE — Plan of Care (Signed)
Problem: Consults Goal: Cardiac Surgery Patient Education ( See Patient Education module for education specifics.) Outcome: Completed/Met Date Met:  04/04/14  Problem: Phase I - Pre-Op Goal: Patient progressed to Phase II; barriers addressed Outcome: Completed/Met Date Met:  04/04/14  Problem: Phase II - Intermediate Post-Op Goal: Wean to Extubate Outcome: Completed/Met Date Met:  04/04/14 Goal: Maintain Hemodynamic Stability Outcome: Progressing Goal: CBGs/Blood Glucose per SCIP Criteria Outcome: Progressing Goal: Pain controlled with appropriate interventions Outcome: Progressing Goal: Advance Diet Outcome: Completed/Met Date Met:  04/04/14 Goal: Activity Progressed Outcome: Completed/Met Date Met:  04/04/14 Goal: Patient advanced to Phase III: Barriers addressed Outcome: Completed/Met Date Met:  04/04/14  Problem: Phase III - Recovery through Discharge Goal: Maintain Hemodynamic Stability Outcome: Progressing Goal: Activity Progressed Outcome: Progressing Goal: Discharge plan remains appropriate-arrangements made Outcome: Progressing

## 2014-04-04 NOTE — Progress Notes (Signed)
Echo Lab  2D Echocardiogram completed.  Kreed Kauffman L Kellon Chalk, RDCS 04/04/2014 9:03 AM

## 2014-04-04 NOTE — Significant Event (Signed)
Patient ambulated halfway to 2W, 37, then taken in wheelchair. VS stable prior and during the transfer. Patient's spouse at bedside. All personal belongings taken by patient's spouse. Patient settled in chair to eat lunch. Receiving staff aware.

## 2014-04-04 NOTE — Progress Notes (Signed)
CARDIAC REHAB PHASE I   PRE:  Rate/Rhythm: 85 afib  BP:  Supine:   Sitting: 126/56  Standing:    SaO2: 89-91%RA  MODE:  Ambulation: 180 ft   POST:  Rate/Rhythm: 99 afib  BP:  Supine:   Sitting: 118/89  Standing:    SaO2: 91%RA hall, 93% room 1358-1424 Pt walked 180 ft on RA with his cane and asst x 2 with fairly steady gait. Stopped once to rest leaning on desk. Checked sats and at 91%. To bathroom after walk and then to recliner. Wife in room. Left off oxygen.   Luetta Nutting, RN BSN  04/04/2014 2:21 PM

## 2014-04-05 ENCOUNTER — Encounter (HOSPITAL_COMMUNITY): Payer: Self-pay | Admitting: Thoracic Surgery (Cardiothoracic Vascular Surgery)

## 2014-04-05 LAB — GLUCOSE, CAPILLARY
GLUCOSE-CAPILLARY: 147 mg/dL — AB (ref 70–99)
Glucose-Capillary: 115 mg/dL — ABNORMAL HIGH (ref 70–99)
Glucose-Capillary: 119 mg/dL — ABNORMAL HIGH (ref 70–99)
Glucose-Capillary: 178 mg/dL — ABNORMAL HIGH (ref 70–99)
Glucose-Capillary: 188 mg/dL — ABNORMAL HIGH (ref 70–99)
Glucose-Capillary: 191 mg/dL — ABNORMAL HIGH (ref 70–99)

## 2014-04-05 MED ORDER — POTASSIUM CHLORIDE CRYS ER 20 MEQ PO TBCR
20.0000 meq | EXTENDED_RELEASE_TABLET | Freq: Two times a day (BID) | ORAL | Status: DC
  Administered 2014-04-05 – 2014-04-08 (×8): 20 meq via ORAL
  Filled 2014-04-05 (×9): qty 1

## 2014-04-05 MED ORDER — INSULIN ASPART 100 UNIT/ML ~~LOC~~ SOLN
0.0000 [IU] | Freq: Three times a day (TID) | SUBCUTANEOUS | Status: DC
  Administered 2014-04-05 – 2014-04-06 (×4): 2 [IU] via SUBCUTANEOUS
  Administered 2014-04-07: 12 [IU] via SUBCUTANEOUS
  Administered 2014-04-08: 2 [IU] via SUBCUTANEOUS
  Administered 2014-04-08 – 2014-04-09 (×2): 4 [IU] via SUBCUTANEOUS

## 2014-04-05 MED ORDER — FUROSEMIDE 80 MG PO TABS
80.0000 mg | ORAL_TABLET | Freq: Every day | ORAL | Status: DC
  Administered 2014-04-06 – 2014-04-07 (×2): 80 mg via ORAL
  Filled 2014-04-05 (×2): qty 1

## 2014-04-05 MED ORDER — FUROSEMIDE 10 MG/ML IJ SOLN
80.0000 mg | Freq: Once | INTRAMUSCULAR | Status: AC
  Administered 2014-04-05: 80 mg via INTRAVENOUS

## 2014-04-05 MED ORDER — INSULIN ASPART 100 UNIT/ML ~~LOC~~ SOLN: 0.0000 [IU] | SUBCUTANEOUS | Status: DC

## 2014-04-05 MED ORDER — FUROSEMIDE 10 MG/ML IJ SOLN
INTRAMUSCULAR | Status: AC
  Filled 2014-04-05: qty 8

## 2014-04-05 MED ORDER — POTASSIUM CHLORIDE CRYS ER 20 MEQ PO TBCR
EXTENDED_RELEASE_TABLET | ORAL | Status: AC
  Filled 2014-04-05: qty 1

## 2014-04-05 MED FILL — Magnesium Sulfate Inj 50%: INTRAMUSCULAR | Qty: 10 | Status: AC

## 2014-04-05 MED FILL — Norepinephrine Bitartrate IV Soln 1 MG/ML (Base Equivalent): INTRAVENOUS | Qty: 4 | Status: AC

## 2014-04-05 MED FILL — Potassium Chloride Inj 2 mEq/ML: INTRAVENOUS | Qty: 40 | Status: AC

## 2014-04-05 MED FILL — Dextrose Inj 5%: INTRAVENOUS | Qty: 250 | Status: AC

## 2014-04-05 MED FILL — Heparin Sodium (Porcine) Inj 1000 Unit/ML: INTRAMUSCULAR | Qty: 30 | Status: AC

## 2014-04-05 NOTE — Progress Notes (Addendum)
       [address]       [name] [address]             [phone]          2 Days Post-Op Procedure(s) (LRB): TRANSCATHETER AORTIC VALVE REPLACEMENT, TRANSFEMORAL (N/A) INTRAOPERATIVE TRANSESOPHAGEAL ECHOCARDIOGRAM (N/A)  Subjective: Feeling well this am, no complaints.   Objective: Vital signs in last 24 hours: Patient Vitals for the past 24 hrs:  BP Temp Temp src Pulse Resp SpO2 Weight  04/05/14 0405 (!) 117/57 mmHg 98.4 F (36.9 C) Oral 84 18 98 % 296 lb 4.8 oz (134.4 kg)  04/05/14 0000 112/61 mmHg - - 85 - 95 % -  04/04/14 2134 - - - - - 95 % -  04/04/14 2020 (!) 118/57 mmHg 97.8 F (36.6 C) Oral 78 18 95 % -  04/04/14 1314 104/66 mmHg - - - 16 96 % -  04/04/14 1200 - - - 70 14 91 % -  04/04/14 1130 101/62 mmHg - - - 14 94 % -  04/04/14 1100 - - - - 17 93 % -  04/04/14 1000 (!) 111/54 mmHg - - - (!) 27 95 % -  04/04/14 0900 - - - - 12 95 % -   Current Weight  04/05/14 296 lb 4.8 oz (134.4 kg)     Intake/Output from previous day: 12/03 0701 - 12/04 0700 In: 528.9 [P.O.:360; I.V.:168.9] Out: 1820 [Urine:1820]  CBGs [phone]   PHYSICAL EXAM:  Heart: Irr irr Lungs: Clear Wound: L groin with some ecchymosis, no hematoma Extremities: Mild LE edema    Lab Results: CBC: Recent Labs  04/03/14 2000 04/03/14 2019 04/04/14 0400  WBC 6.4  --  7.2  HGB 9.6* 10.9* 9.6*  HCT 30.9* 32.0* 31.3*  PLT 78*  --  79*   BMET:  Recent Labs  04/03/14 2019 04/04/14 0400  NA 139 138  K 4.9 4.4  CL 102 102  CO2  --  26  GLUCOSE 141* 132*  BUN 12 13  CREATININE 0.80 0.77  CALCIUM  --  8.5    PT/INR:  Recent Labs  04/03/14 1230  LABPROT 17.7*  INR 1.44      Assessment/Plan: S/P Procedure(s) (LRB): TRANSCATHETER AORTIC VALVE REPLACEMENT, TRANSFEMORAL (N/A) INTRAOPERATIVE TRANSESOPHAGEAL ECHOCARDIOGRAM (N/A) CV- stable chronic flutter. Continue ASA, Plavix.  Echo performed yesterday, but not in Epic yet. DM- CBGs improved on  Levemir.  Would resume home meds at d/c. Vol overload- for additional diuresis today per Dr. Excell Seltzer. Hopefully home in am per cardiology if he remains stable.   LOS: 2 days    COLLINS,GINA H 04/05/2014   Chart reviewed, patient examined, agree with above.

## 2014-04-05 NOTE — Progress Notes (Signed)
Medicare Important Message given? YES  (If response is "NO", the following Medicare IM given date fields will be blank)  Date Medicare IM given: 04/05/14 Medicare IM given by:  Lynette Topete  

## 2014-04-05 NOTE — Progress Notes (Signed)
Inpatient Diabetes Program Recommendations  AACE/ADA: New Consensus Statement on Inpatient Glycemic Control (2013)  Target Ranges:  Prepandial:   less than 140 mg/dL      Peak postprandial:   less than 180 mg/dL (1-2 hours)      Critically ill patients:  140 - 180 mg/dL   Reason for Assessment:  Results for DAI, MCADAMS (MRN [id number]) as of 04/05/2014 12:00  Ref. Range 04/04/2014 16:29 04/04/2014 20:56 04/05/2014 00:07 04/05/2014 03:59 04/05/2014 10:11  Glucose-Capillary Latest Range: 70-99 mg/dL 768 (H) 115 (H) 726 (H) 119 (H) 115 (H)   Diabetes history: Type 2 diabetes Outpatient Diabetes medications: Novolin 70/30 80 units bid Current orders for Inpatient glycemic control:  Levemir 40 units bid, TCTS q 4 hours  Consider adding Novolog 6 units tid with meals (hold if patient eats less than 50%).  Thanks, Beryl Meager, RN, BC-ADM Inpatient Diabetes Coordinator Pager [phone]

## 2014-04-05 NOTE — Progress Notes (Signed)
    Subjective:  No CP. Dyspnea with walking but no other complaints. No orthopnea or PND.   Objective:  Vital Signs in the last 24 hours: Temp:  [97.8 F (36.6 C)-98.4 F (36.9 C)] 98.4 F (36.9 C) (12/04 0405) Pulse Rate:  [70-85] 84 (12/04 0405) Resp:  [14-18] 18 (12/04 0405) BP: (101-118)/(57-66) 117/57 mmHg (12/04 0405) SpO2:  [91 %-98 %] 98 % (12/04 0405) Weight:  [296 lb 4.8 oz (134.4 kg)] 296 lb 4.8 oz (134.4 kg) (12/04 0405)  Intake/Output from previous day: 12/03 0701 - 12/04 0700 In: 528.9 [P.O.:360; I.V.:168.9] Out: 1820 [Urine:1820]  Physical Exam: Pt is alert and oriented, NAD HEENT: normal Neck: JVP - normal Lungs: CTA bilaterally CV: irregular without murmur Abd: soft, NT, Positive BS, no hepatomegaly Ext: 1+ pretibial edema bilaterally, distal pulses intact and equal Skin: warm/dry no rash   Lab Results:  Recent Labs  04/03/14 2000 04/03/14 2019 04/04/14 0400  WBC 6.4  --  7.2  HGB 9.6* 10.9* 9.6*  PLT 78*  --  79*    Recent Labs  04/03/14 2019 04/04/14 0400  NA 139 138  K 4.9 4.4  CL 102 102  CO2  --  26  GLUCOSE 141* 132*  BUN 12 13  CREATININE 0.80 0.77   No results for input(s): TROPONINI in the last 72 hours.  Invalid input(s): CK, MB  Cardiac Studies: 2D Echo pending  Tele: Atrial flutter with controlled ventricular rate, heart rate 80 bpm  Assessment/Plan:  1. Severe aortic stenosis POD #2 from TAVR via left TF approach 2. Atrial flutter, chronic 3. Diabetes, Type 2 4. Cirrhosis 5. Acute on chronic diastolic CHF with post-op volume overload  Weight is up today with positive fluid balance. Will give one dose of lasix 80 mg IV this am and resume home lasix dose tomorrow with plans for discharge home tomorrow on current medical therapy. Await 2D Echo interpretation. Heart rate well-controlled on current meds. Plan ASA and plavix at discharge as patient has had blood loss anemia in setting of anticoagulation and cirrhosis.  Think risk of warfarin outweighs potential benefit. Pt will need 1 week TOC visit in [place] office for Dr Purvis Sheffield and we will arrange a 30 day valve clinic appt with an echo at that time.    Tonny Bollman, M.D. 04/05/2014, 10:09 AM

## 2014-04-05 NOTE — Progress Notes (Signed)
CARDIAC REHAB PHASE I   PRE:  Rate/Rhythm: 97 Afib  BP:  Supine:   Sitting: 123/52  Standing:    SaO2: 91 RA  MODE:  Ambulation: 220 ft   POST:  Rate/Rhythm:   BP:  Supine:   Sitting: 140/81  Standing:    SaO2: 88 RA 1300-1345 Assisted X 1 to ambulate and pt used his cane. He was able to walk 220 feet with one standing rest stop. Pt tires as he walks and is DOE. RA sat after walk 88%. Discussed with pt Outpt. CRP, he is not interested due to the travel time to [place]. Discussed with pt and wife discharge education. I encouraged use of IS and walking at home.  Melina Copa RN 04/05/2014 1:45 PM

## 2014-04-06 LAB — CBC
HCT: 34.1 % — ABNORMAL LOW (ref 39.0–52.0)
Hemoglobin: 10.5 g/dL — ABNORMAL LOW (ref 13.0–17.0)
MCH: 27.7 pg (ref 26.0–34.0)
MCHC: 30.8 g/dL (ref 30.0–36.0)
MCV: 90 fL (ref 78.0–100.0)
Platelets: 74 10*3/uL — ABNORMAL LOW (ref 150–400)
RBC: 3.79 MIL/uL — AB (ref 4.22–5.81)
RDW: 19.3 % — AB (ref 11.5–15.5)
WBC: 6.9 10*3/uL (ref 4.0–10.5)

## 2014-04-06 LAB — GLUCOSE, CAPILLARY
GLUCOSE-CAPILLARY: 107 mg/dL — AB (ref 70–99)
GLUCOSE-CAPILLARY: 131 mg/dL — AB (ref 70–99)
Glucose-Capillary: 160 mg/dL — ABNORMAL HIGH (ref 70–99)
Glucose-Capillary: 160 mg/dL — ABNORMAL HIGH (ref 70–99)

## 2014-04-06 LAB — BASIC METABOLIC PANEL
ANION GAP: 11 (ref 5–15)
BUN: 17 mg/dL (ref 6–23)
CHLORIDE: 98 meq/L (ref 96–112)
CO2: 27 meq/L (ref 19–32)
Calcium: 9 mg/dL (ref 8.4–10.5)
Creatinine, Ser: 0.76 mg/dL (ref 0.50–1.35)
GFR calc Af Amer: >90 mL/min (ref >90)
GFR calc non Af Amer: >90 mL/min (ref >90)
Glucose, Bld: 94 mg/dL (ref 70–99)
POTASSIUM: 3.9 meq/L (ref 3.7–5.3)
SODIUM: 136 meq/L — AB (ref 137–147)

## 2014-04-06 MED ORDER — FUROSEMIDE 10 MG/ML IJ SOLN
80.0000 mg | Freq: Two times a day (BID) | INTRAMUSCULAR | Status: AC
  Administered 2014-04-06 – 2014-04-07 (×2): 80 mg via INTRAVENOUS
  Filled 2014-04-06 (×3): qty 8

## 2014-04-06 MED ORDER — FUROSEMIDE 10 MG/ML IJ SOLN
40.0000 mg | Freq: Once | INTRAMUSCULAR | Status: AC
  Administered 2014-04-06: 40 mg via INTRAVENOUS
  Filled 2014-04-06: qty 4

## 2014-04-06 MED ORDER — FUROSEMIDE 10 MG/ML IJ SOLN
INTRAMUSCULAR | Status: AC
  Filled 2014-04-06: qty 4

## 2014-04-06 NOTE — Progress Notes (Signed)
Subjective:  Awoke again this morning with shortness of breath and required additional furosemide.  No complaints of chest pain.  Still dyspneic and notes further pedal edema.  Objective:  Vital Signs in the last 24 hours: BP 125/63 mmHg  Pulse 84  Temp(Src) 98.2 F (36.8 C) (Oral)  Resp 19  Ht 6' (1.829 m)  Wt 134.854 kg (297 lb 4.8 oz)  BMI 40.31 kg/m2  SpO2 100%  Physical Exam: Elderly male currently in no acute distress Lungs:  Minimal crackles at bases  Cardiac:  Regular rhythm, normal S1 and S2, no S3, 1 to 2/6 systolic murmur Extremities: 1-2+ edema present  Intake/Output from previous day: 12/04 0701 - 12/05 0700 In: 440 [P.O.:440] Out: 1775 [Urine:1775] Weight Filed Weights   04/04/14 0500 04/05/14 0405 04/06/14 0500  Weight: 130.1 kg (286 lb 13.1 oz) 134.4 kg (296 lb 4.8 oz) 134.854 kg (297 lb 4.8 oz)    Lab Results: Basic Metabolic Panel:  Recent Labs  61/47/09 0400 04/06/14 0355  NA 138 136*  K 4.4 3.9  CL 102 98  CO2 26 27  GLUCOSE 132* 94  BUN 13 17  CREATININE 0.77 0.76    CBC:  Recent Labs  04/04/14 0400 04/06/14 0355  WBC 7.2 6.9  HGB 9.6* 10.5*  HCT 31.3* 34.1*  MCV 89.9 90.0  PLT 79* 74*     PROTIME: Lab Results  Component Value Date   INR 1.44 04/03/2014   INR 1.18 04/01/2014   INR 1.22 03/12/2014    Telemetry: Atrial fibrillation with controlled response  Assessment/Plan:  1.  Acute diastolic heart failure due to recent aortic valve stenosis. 2. Recent TAVR 3.  Recurrent shortness of breath  Recommendations:  With recurrent shortness of breath this morning.  Will need additional diuresis.  He has been eating a lot of ice and and will ask for fluid restriction.  Intravenous diuresis today.  Hopefully discharge in the morning.     Darden Palmer  MD [facility]  04/06/2014, 10:12 AM

## 2014-04-06 NOTE — Progress Notes (Addendum)
CARDIAC REHAB PHASE I   PRE:  Rate/Rhythm: 103 Afib  BP:  Supine:   Sitting: 123/70  Standing:    SaO2: 94 2L 91 RA  MODE:  Ambulation: 420 ft   POST:  Rate/Rhythm: 136  BP:  Supine:   Sitting: 164/71  Standing:    SaO2: 87 RA 94 2L 0940-1030 On arrival pt on side of bed, states that he has a rough night and did not sleep due to SOB. States that he feels better this morning. O2 discontinued room air sat 91%, Assisted X 1 and used walker to ambulate. Gait steady with walker. Pt tires walking and is DOE. RA sat in hall 89%. Pt to one sitting rest stop during walk. Pt's room air sat when back from walk 87%, O2 reapplied 2L sat increased to 94%. Pt back to side of bed after walk with call light in reach and wife present.  Melina Copa RN 04/06/2014 10:23 AM

## 2014-04-06 NOTE — Progress Notes (Signed)
Spoke with MD oncall fro Dr. Excell Seltzer r/t to patient's BP of 96/42. Md ordered for patient's Lasix as well as his Lopressor to be held. This Clinical research associate v/u. Patient is asymptomatic. Maintained on the monitor and is in Afib-Aflutter. Will continue to monitor.  Sharlene Dory, RN

## 2014-04-06 NOTE — Progress Notes (Signed)
SATURATION QUALIFICATIONS: (This note is used to comply with regulatory documentation for home oxygen)  Patient Saturations on Room Air at Rest = 91%  Patient Saturations on Room Air while Ambulating = 87%  Patient Saturations on 2 Liters of oxygen while Ambulating = 94%  Please briefly explain why patient needs home oxygen: Pt is very DOE walking room air sat down to 87% after walk, with O2 2L sat returned to 94%.

## 2014-04-06 NOTE — Progress Notes (Addendum)
[address]       [name] [address]             [phone]      3 Days Post-Op Procedure(s) (LRB): TRANSCATHETER AORTIC VALVE REPLACEMENT, TRANSFEMORAL (N/A) INTRAOPERATIVE TRANSESOPHAGEAL ECHOCARDIOGRAM (N/A) Subjective: Some SOB overnight, sats ok on 2 liters [place]  Objective: Vital signs in last 24 hours: Temp:  [98.2 F (36.8 C)-98.3 F (36.8 C)] 98.2 F (36.8 C) (12/04 2101) Pulse Rate:  [68-113] 84 (12/05 0320) Cardiac Rhythm:  [-] Atrial fibrillation (12/04 2030) Resp:  [19-20] 19 (12/04 2101) BP: (116-132)/(46-63) 125/63 mmHg (12/05 0320) SpO2:  [96 %-100 %] 100 % (12/05 0320) Weight:  [297 lb 4.8 oz (134.854 kg)] 297 lb 4.8 oz (134.854 kg) (12/05 0500)  Hemodynamic parameters for last 24 hours:    Intake/Output from previous day: 12/04 0701 - 12/05 0700 In: 440 [P.O.:440] Out: 1775 [Urine:1775] Intake/Output this shift:    General appearance: alert, cooperative and no distress Heart: irregularly irregular rhythm and no murmur Lungs: dim in lower fields Abdomen: benign Extremities: + LE/scrotal edema Wound: + echymosis, no erethema  Lab Results:  Recent Labs  04/04/14 0400 04/06/14 0355  WBC 7.2 6.9  HGB 9.6* 10.5*  HCT 31.3* 34.1*  PLT 79* 74*   BMET:  Recent Labs  04/04/14 0400 04/06/14 0355  NA 138 136*  K 4.4 3.9  CL 102 98  CO2 26 27  GLUCOSE 132* 94  BUN 13 17  CREATININE 0.77 0.76  CALCIUM 8.5 9.0    PT/INR:  Recent Labs  04/03/14 1230  LABPROT 17.7*  INR 1.44   ABG    Component Value Date/Time   PHART 7.383 04/03/2014 1406   HCO3 29.2* 04/03/2014 1406   TCO2 24 04/03/2014 2019   ACIDBASEDEF 0.0 12/27/2008 1412   O2SAT 98.0 04/03/2014 1406   CBG (last 3)   Recent Labs  04/05/14 1625 04/05/14 2143 04/06/14 0626  GLUCAP 188* 178* 107*    Meds Scheduled Meds: . arformoterol  15 mcg Nebulization Q12H  . aspirin EC  81 mg Oral Daily  . budesonide  0.25 mg Nebulization BID  .  clopidogrel  75 mg Oral Q breakfast  . diltiazem  30 mg Oral 4 times per day  . docusate sodium  200 mg Oral Daily  . ezetimibe  10 mg Oral QHS  . furosemide  80 mg Oral Daily  . insulin aspart  0-24 Units Subcutaneous TID WC  . insulin detemir  40 Units Subcutaneous BID  . loratadine  10 mg Oral Daily  . metoprolol succinate  25 mg Oral BID  . moving right along book   Does not apply Once  . potassium chloride  20 mEq Oral BID  . predniSONE  5 mg Oral Q breakfast  . sodium chloride  3 mL Intravenous Q12H   Continuous Infusions:  PRN Meds:.sodium chloride, acetaminophen, bisacodyl **OR** bisacodyl, ipratropium-albuterol, ondansetron **OR** ondansetron (ZOFRAN) IV, oxyCODONE-acetaminophen, sodium chloride, traMADol  Xrays No results found.  Assessment/Plan: S/P Procedure(s) (LRB): TRANSCATHETER AORTIC VALVE REPLACEMENT, TRANSFEMORAL (N/A) INTRAOPERATIVE TRANSESOPHAGEAL ECHOCARDIOGRAM (N/A)  1 primary issue seems to be significant volume overload, cont lasix management per cardiology 2 chronic afib- rate fairly well controlled 3 push rehab as able    LOS: 3 days    GOLD,WAYNE E 04/06/2014    Chart reviewed, patient examined, agree with above. He needs further diuresis before he can go home. His weight is 15 lbs over preop  wt and he still has a lot of edema.

## 2014-04-07 LAB — BASIC METABOLIC PANEL
Anion gap: 12 (ref 5–15)
BUN: 15 mg/dL (ref 6–23)
CHLORIDE: 99 meq/L (ref 96–112)
CO2: 27 meq/L (ref 19–32)
CREATININE: 0.79 mg/dL (ref 0.50–1.35)
Calcium: 8.8 mg/dL (ref 8.4–10.5)
GFR calc Af Amer: >90 mL/min (ref >90)
GFR calc non Af Amer: 90 mL/min — ABNORMAL LOW (ref >90)
GLUCOSE: 98 mg/dL (ref 70–99)
Potassium: 3.6 mEq/L — ABNORMAL LOW (ref 3.7–5.3)
Sodium: 138 mEq/L (ref 137–147)

## 2014-04-07 LAB — GLUCOSE, CAPILLARY
GLUCOSE-CAPILLARY: 98 mg/dL (ref 70–99)
Glucose-Capillary: 271 mg/dL — ABNORMAL HIGH (ref 70–99)
Glucose-Capillary: 83 mg/dL (ref 70–99)
Glucose-Capillary: 72 mg/dL (ref 70–99)

## 2014-04-07 MED ORDER — METOLAZONE 2.5 MG PO TABS
2.5000 mg | ORAL_TABLET | Freq: Every day | ORAL | Status: AC
  Administered 2014-04-07 – 2014-04-08 (×2): 2.5 mg via ORAL
  Filled 2014-04-07 (×2): qty 1

## 2014-04-07 MED ORDER — POLYETHYLENE GLYCOL 3350 17 G PO PACK
17.0000 g | PACK | Freq: Every day | ORAL | Status: DC
  Administered 2014-04-07 – 2014-04-09 (×3): 17 g via ORAL
  Filled 2014-04-07 (×3): qty 1

## 2014-04-07 MED ORDER — FUROSEMIDE 10 MG/ML IJ SOLN
80.0000 mg | Freq: Two times a day (BID) | INTRAMUSCULAR | Status: AC
  Administered 2014-04-08 (×2): 80 mg via INTRAVENOUS
  Filled 2014-04-07 (×2): qty 8

## 2014-04-07 MED ORDER — DIGOXIN 0.25 MG/ML IJ SOLN
0.2500 mg | Freq: Four times a day (QID) | INTRAMUSCULAR | Status: AC
  Administered 2014-04-07 (×3): 0.25 mg via INTRAVENOUS
  Filled 2014-04-07 (×3): qty 1

## 2014-04-07 NOTE — Progress Notes (Addendum)
Subjective:  Slipped without significant shortness of breath but has continued severe edema.  He has significant scrotal and penile edema and his weight has not changed that much.  Not much urine output thus far.  Atrial fibrillation rate remains somewhat fast.   Objective:  Vital Signs in the last 24 hours: BP 140/70 mmHg  Pulse 94  Temp(Src) 98.9 F (37.2 C) (Oral)  Resp 18  Ht 5\' 11"  (1.803 m)  Wt 133.358 kg (294 lb)  BMI 41.02 kg/m2  SpO2 94%  Physical Exam: Elderly male currently in no acute distress Lungs:  Minimal crackles at bases  Cardiac: Rapid irregular rhythm, normal S1 and S2, no S3, 1 to 2/6 systolic murmur Extremities: 2+ peripheral edema.  Significant penile and scrotal edema noted.    Intake/Output from previous day: 12/05 0701 - 12/06 0700 In: 1203 [P.O.:1200; I.V.:3] Out: 875 [Urine:875] Weight Filed Weights   04/05/14 0405 04/06/14 0500 04/07/14 0505  Weight: 134.4 kg (296 lb 4.8 oz) 134.854 kg (297 lb 4.8 oz) 133.358 kg (294 lb)    Lab Results: Basic Metabolic Panel:  Recent Labs  14/06/15 0355 04/07/14 0441  NA 136* 138  K 3.9 3.6*  CL 98 99  CO2 27 27  GLUCOSE 94 98  BUN 17 15  CREATININE 0.76 0.79    CBC:  Recent Labs  04/06/14 0355  WBC 6.9  HGB 10.5*  HCT 34.1*  MCV 90.0  PLT 74*     PROTIME: Lab Results  Component Value Date   INR 1.44 04/03/2014   INR 1.18 04/01/2014   INR 1.22 03/12/2014    Telemetry: Atrial fibrillation with somewhat rapid response  Assessment/Plan:  1.  Acute diastolic heart failure due to recent aortic valve stenosis. 2. Recent TAVR 3.  Atrial fibrillation with rapid response. 4.  Anasarca and fluid retention. 5.  Hypokalemia  Recommendations:  Significant scrotal and penile edema and 2+ peripheral edema.  We'll add Zaroxolyn to regimen and continue intravenous Lasix.  Evidently, Lasix was held last night.  Additional rate control for atrial fibrillation.  His blood pressure is soft and  will use Lanoxin to help control his atrial fibrillation rate.     13/01/2014  MD [facility]  04/07/2014, 11:23 AM

## 2014-04-07 NOTE — Plan of Care (Signed)
Problem: Phase I Progression Outcomes Goal: Other Phase I Outcomes/Goals Outcome: Progressing     

## 2014-04-07 NOTE — Progress Notes (Addendum)
      [address]       [place] [address]             [phone]      4 Days Post-Op Procedure(s) (LRB): TRANSCATHETER AORTIC VALVE REPLACEMENT, TRANSFEMORAL (N/A) INTRAOPERATIVE TRANSESOPHAGEAL ECHOCARDIOGRAM (N/A) Subjective:  c/o scrotal edema cont to worsen  Objective: Vital signs in last 24 hours: Temp:  [98.7 F (37.1 C)-99.1 F (37.3 C)] 98.9 F (37.2 C) (12/06 0505) Pulse Rate:  [45-96] 94 (12/06 0604) Cardiac Rhythm:  [-] Atrial flutter (12/05 1946) Resp:  [18-20] 18 (12/06 0604) BP: (90-140)/(42-71) 140/70 mmHg (12/06 0505) SpO2:  [92 %-95 %] 94 % (12/06 0505) Weight:  [294 lb (133.358 kg)] 294 lb (133.358 kg) (12/06 0505)  Hemodynamic parameters for last 24 hours:    Intake/Output from previous day: 12/05 0701 - 12/06 0700 In: 1203 [P.O.:1200; I.V.:3] Out: 875 [Urine:875] Intake/Output this shift:    General appearance: alert, cooperative and no distress Heart: regular rate and rhythm and no murmur Lungs: mildly dim in the right base Abdomen: benign Extremities: marked edema persists Wound: ok  Lab Results:  Recent Labs  04/06/14 0355  WBC 6.9  HGB 10.5*  HCT 34.1*  PLT 74*   BMET:  Recent Labs  04/06/14 0355 04/07/14 0441  NA 136* 138  K 3.9 3.6*  CL 98 99  CO2 27 27  GLUCOSE 94 98  BUN 17 15  CREATININE 0.76 0.79  CALCIUM 9.0 8.8    PT/INR: No results for input(s): LABPROT, INR in the last 72 hours. ABG    Component Value Date/Time   PHART 7.383 04/03/2014 1406   HCO3 29.2* 04/03/2014 1406   TCO2 24 04/03/2014 2019   ACIDBASEDEF 0.0 12/27/2008 1412   O2SAT 98.0 04/03/2014 1406   CBG (last 3)   Recent Labs  04/06/14 1617 04/06/14 2127 04/07/14 0623  GLUCAP 131* 160* 98    Meds Scheduled Meds: . arformoterol  15 mcg Nebulization Q12H  . aspirin EC  81 mg Oral Daily  . budesonide  0.25 mg Nebulization BID  . clopidogrel  75 mg Oral Q breakfast  . diltiazem  30 mg Oral 4 times per day  .  docusate sodium  200 mg Oral Daily  . ezetimibe  10 mg Oral QHS  . furosemide  80 mg Intravenous Q12H  . furosemide  80 mg Oral Daily  . insulin aspart  0-24 Units Subcutaneous TID WC  . insulin detemir  40 Units Subcutaneous BID  . loratadine  10 mg Oral Daily  . metoprolol succinate  25 mg Oral BID  . moving right along book   Does not apply Once  . potassium chloride  20 mEq Oral BID  . predniSONE  5 mg Oral Q breakfast  . sodium chloride  3 mL Intravenous Q12H   Continuous Infusions:  PRN Meds:.sodium chloride, acetaminophen, bisacodyl **OR** bisacodyl, ipratropium-albuterol, ondansetron **OR** ondansetron (ZOFRAN) IV, oxyCODONE-acetaminophen, sodium chloride, traMADol  Xrays No results found.  Assessment/Plan: S/P Procedure(s) (LRB): TRANSCATHETER AORTIC VALVE REPLACEMENT, TRANSFEMORAL (N/A) INTRAOPERATIVE TRANSESOPHAGEAL ECHOCARDIOGRAM (N/A)  1 cont to diurese- apparently didn't get last night's dose d/t low relative BP(90's) Will defer management to cardiology Instructed patient on scrotal support 2 afib- rate is up at times- monitor closely 3 Push rehab as able- pretty weak  LOS: 4 days    GOLD,WAYNE E 04/07/2014   Chart reviewed, patient examined, agree with above. He needs higher dose lasix or metolazone.

## 2014-04-08 DIAGNOSIS — I482 Chronic atrial fibrillation: Secondary | ICD-10-CM

## 2014-04-08 DIAGNOSIS — R600 Localized edema: Secondary | ICD-10-CM

## 2014-04-08 DIAGNOSIS — J439 Emphysema, unspecified: Secondary | ICD-10-CM

## 2014-04-08 LAB — BASIC METABOLIC PANEL
Anion gap: 17 — ABNORMAL HIGH (ref 5–15)
BUN: 14 mg/dL (ref 6–23)
CO2: 28 meq/L (ref 19–32)
Calcium: 9.1 mg/dL (ref 8.4–10.5)
Chloride: 90 mEq/L — ABNORMAL LOW (ref 96–112)
Creatinine, Ser: 0.8 mg/dL (ref 0.50–1.35)
GFR calc Af Amer: >90 mL/min (ref >90)
GFR, EST NON AFRICAN AMERICAN: 89 mL/min — AB (ref >90)
Glucose, Bld: 95 mg/dL (ref 70–99)
POTASSIUM: 3.8 meq/L (ref 3.7–5.3)
SODIUM: 135 meq/L — AB (ref 137–147)

## 2014-04-08 LAB — PRO B NATRIURETIC PEPTIDE: Pro B Natriuretic peptide (BNP): 237.7 pg/mL — ABNORMAL HIGH (ref 0–125)

## 2014-04-08 LAB — GLUCOSE, CAPILLARY
GLUCOSE-CAPILLARY: 109 mg/dL — AB (ref 70–99)
GLUCOSE-CAPILLARY: 208 mg/dL — AB (ref 70–99)
Glucose-Capillary: 146 mg/dL — ABNORMAL HIGH (ref 70–99)
Glucose-Capillary: 163 mg/dL — ABNORMAL HIGH (ref 70–99)

## 2014-04-08 NOTE — Progress Notes (Signed)
CARE MANAGEMENT NOTE 04/08/2014  Patient:  [name], [name]   Account Number:  [id number]  Date Initiated:  04/03/2014  Documentation initiated by:  MAYO,HENRIETTA  Subjective/Objective Assessment:   s/p AVR; lives with spouse    PCP  Donzetta Sprung     Action/Plan:   Anticipated DC Date:  04/09/2014   Anticipated DC Plan:  HOME/SELF CARE      DC Planning Services  CM consult      Choice offered to / List presented to:     DME arranged  Levan Hurst      DME agency  [facility]        Status of service:  Completed, signed off Medicare Important Message given?  YES (If response is "NO", the following Medicare IM given date fields will be blank) Date Medicare IM given:  04/05/2014 Medicare IM given by:  Donn Pierini Date Additional Medicare IM given:  04/08/2014 Additional Medicare IM given by:  Isidoro Donning  Discharge Disposition:  HOME/SELF CARE  Per UR Regulation:  Reviewed for med. necessity/level of care/duration of stay  If discussed at Long Length of Stay Meetings, dates discussed:    Comments:  04/08/2014 1430 NCM spoke to pt, gave permission to speak to wife. Wife declines HH at this time. Requested Rollator for home. States he has wheelchair at home. Contacted [facility] for Rollator. Isidoro Donning RN CCM Case Mgmt phone [phone]

## 2014-04-08 NOTE — Progress Notes (Signed)
Subjective:  POD # 5 TAVR ([name]). No CP/SOB  Objective:  Temp:  [98.6 F (37 C)-98.7 F (37.1 C)] 98.6 F (37 C) (12/07 0326) Pulse Rate:  [86-100] 96 (12/07 0946) Resp:  [18] 18 (12/07 0326) BP: (105-145)/(44-90) 132/53 mmHg (12/07 0946) SpO2:  [92 %-95 %] 94 % (12/07 0901) Weight:  [288 lb (130.636 kg)] 288 lb (130.636 kg) (12/07 0326) Weight change: -6 lb (-2.722 kg)  Intake/Output from previous day: 12/06 0701 - 12/07 0700 In: 480 [P.O.:480] Out: -   Intake/Output from this shift: Total I/O In: 240 [P.O.:240] Out: -   Physical Exam: General appearance: alert and no distress Neck: no adenopathy, no carotid bruit, no JVD, supple, symmetrical, trachea midline and thyroid not enlarged, symmetric, no tenderness/mass/nodules Lungs: clear to auscultation bilaterally Heart: irregularly irregular rhythm Extremities: 2+ edema as well as scrotal edema  Lab Results: Results for orders placed or performed during the hospital encounter of 04/03/14 (from the past 48 hour(s))  Glucose, capillary     Status: Abnormal   Collection Time: 04/06/14 11:13 AM  Result Value Ref Range   Glucose-Capillary 160 (H) 70 - 99 mg/dL  Glucose, capillary     Status: Abnormal   Collection Time: 04/06/14  4:17 PM  Result Value Ref Range   Glucose-Capillary 131 (H) 70 - 99 mg/dL  Glucose, capillary     Status: Abnormal   Collection Time: 04/06/14  9:27 PM  Result Value Ref Range   Glucose-Capillary 160 (H) 70 - 99 mg/dL  Basic metabolic panel     Status: Abnormal   Collection Time: 04/07/14  4:41 AM  Result Value Ref Range   Sodium 138 137 - 147 mEq/L   Potassium 3.6 (L) 3.7 - 5.3 mEq/L   Chloride 99 96 - 112 mEq/L   CO2 27 19 - 32 mEq/L   Glucose, Bld 98 70 - 99 mg/dL   BUN 15 6 - 23 mg/dL   Creatinine, Ser 0.79 0.50 - 1.35 mg/dL   Calcium 8.8 8.4 - 10.5 mg/dL   GFR calc non Af Amer 90 (L) >90 mL/min   GFR calc Af Amer >90 >90 mL/min    Comment: (NOTE) The eGFR has been  calculated using the CKD EPI equation. This calculation has not been validated in all clinical situations. eGFR's persistently <90 mL/min signify possible Chronic Kidney Disease.    Anion gap 12 5 - 15  Glucose, capillary     Status: None   Collection Time: 04/07/14  6:23 AM  Result Value Ref Range   Glucose-Capillary 98 70 - 99 mg/dL  Glucose, capillary     Status: Abnormal   Collection Time: 04/07/14 11:23 AM  Result Value Ref Range   Glucose-Capillary 271 (H) 70 - 99 mg/dL  Glucose, capillary     Status: None   Collection Time: 04/07/14  4:07 PM  Result Value Ref Range   Glucose-Capillary 83 70 - 99 mg/dL  Glucose, capillary     Status: None   Collection Time: 04/07/14  9:41 PM  Result Value Ref Range   Glucose-Capillary 72 70 - 99 mg/dL  Basic metabolic panel     Status: Abnormal   Collection Time: 04/08/14  3:59 AM  Result Value Ref Range   Sodium 135 (L) 137 - 147 mEq/L   Potassium 3.8 3.7 - 5.3 mEq/L    Comment: HEMOLYSIS AT THIS LEVEL MAY AFFECT RESULT   Chloride 90 (L) 96 - 112 mEq/L    Comment:  DELTA CHECK NOTED   CO2 28 19 - 32 mEq/L   Glucose, Bld 95 70 - 99 mg/dL   BUN 14 6 - 23 mg/dL   Creatinine, Ser 0.80 0.50 - 1.35 mg/dL   Calcium 9.1 8.4 - 10.5 mg/dL   GFR calc non Af Amer 89 (L) >90 mL/min   GFR calc Af Amer >90 >90 mL/min    Comment: (NOTE) The eGFR has been calculated using the CKD EPI equation. This calculation has not been validated in all clinical situations. eGFR's persistently <90 mL/min signify possible Chronic Kidney Disease.    Anion gap 17 (H) 5 - 15  Pro b natriuretic peptide (BNP)     Status: Abnormal   Collection Time: 04/08/14  3:59 AM  Result Value Ref Range   Pro B Natriuretic peptide (BNP) 237.7 (H) 0 - 125 pg/mL  Glucose, capillary     Status: Abnormal   Collection Time: 04/08/14  5:44 AM  Result Value Ref Range   Glucose-Capillary 109 (H) 70 - 99 mg/dL    Imaging: Imaging results have been reviewed  Tele- AFIB with  CVR  Assessment/Plan:   1. Principal Problem: 2.   S/P TAVR (transcatheter aortic valve replacement) 3. Active Problems: 4.   Severe aortic valve stenosis 5. Afib 6. NASH/ esoph varices   Time Spent Directly with Patient:  20 minutes  Length of Stay:  LOS: 5 days   POD #5  TAVR. CAF with LBBB. Less SOB. Exam benign. Rate controlled . Not an AC candidate per Dr. Burt Knack secondary to anemia and esoph varices. [place] working with him. Major issue now is volume overload (about 10#). He still has 2+ pitting edema as well as scrotal swelling. Getting BID IV lasix as well as zaroxolyn. I/Os not accurate. Cont to diurese. Home towards mid-end of week. Follow BMET.   Lorretta Harp 04/08/2014, 10:47 AM

## 2014-04-08 NOTE — Progress Notes (Signed)
04/08/2014 1640 Pt will have to pay out of pocket for Rollator, received a RW in 2011 per insurance. Isidoro Donning RN CCM Case Mgmt phone [phone]

## 2014-04-08 NOTE — Plan of Care (Signed)
Problem: Phase I Progression Outcomes Goal: Initial discharge plan identified Outcome: Progressing Goal: Voiding-avoid urinary catheter unless indicated Outcome: Progressing  Problem: Phase II Progression Outcomes Goal: Discharge plan in place and appropriate Outcome: Progressing Goal: Pain controlled with appropriate interventions Outcome: Progressing Goal: Hemodynamically stable Outcome: Progressing Goal: Ambulates up to 600 ft. in hall x 1 Outcome: Progressing Goal: Tolerates diet Outcome: Progressing Goal: Activity appropriate for discharge plan Outcome: Progressing Goal: Vascular site scale level 0 - I Vascular Site Scale Level 0: No bruising/bleeding/hematoma Level I (Mild): Bruising/Ecchymosis, minimal bleeding/ooozing, palpable hematoma < 3 cm Level II (Moderate): Bleeding not affecting hemodynamic parameters, pseudoaneurysm, palpable hematoma > 3 cm Level III (Severe) Bleeding which affects hemodynamic parameters or retroperitoneal hemorrhage  Outcome: Progressing  Problem: Consults Goal: Cardiac Rehab Consult Outcome: Progressing Goal: Skin Care Protocol Initiated - if Braden Score 18 or less If consults are not indicated, leave blank or document N/A  Outcome: Progressing  Problem: Phase I - Pre-Op Goal: Pain controlled with appropriate interventions Outcome: Progressing

## 2014-04-08 NOTE — Progress Notes (Signed)
CARDIAC REHAB PHASE I   PRE:  Rate/Rhythm: 85 Afib  BP:  Supine:   Sitting: 143/54  Standing:    SaO2: 92 RA  MODE:  Ambulation: 460 ft   POST:  Rate/Rhythm: 118  BP:  Supine:   Sitting: 143/61  Standing:    SaO2: 94 2L 1140-1215 On arrival pt in bed, willing to walk. Assisted X 1 used rollator and O2 2L to ambulate. Gait steady with rollator. Pt able to walk 460 feet. He states that this walk felt the easiest one since surgery. Pt to recliner after walk with call light in reach and wife present. HR after walk 118. O2 sat after walk 94%.  Melina Copa RN 04/08/2014 12:17 PM

## 2014-04-08 NOTE — Clinical Documentation Improvement (Signed)
Supporting Information: Plan aspirin and plavix at discharge as patient has had blood loss anemia in setting of anticoagulation and cirrhosis per 12/04 progress notes. EBL= 400 ml per 12/02 anesthesia record. Received 200 ml cell saver per 12/02 anesthesia record.  Labs: H/H: 12/02:  12.2/36.0 (pre-op) 12/02:  10.7/35.2 (post-op) 12/03:   9.6/31.3    Possible Diagnosis? . Documentation of Anemia should include the type of anemia: --Acute on chronic blood loss anemia --Acute blood loss anemia --Other (please specify)    Thank You, Debria Garret Documentation Specialist [phone] [email or web address]

## 2014-04-08 NOTE — Clinical Documentation Improvement (Signed)
Supporting Information: Patient with history of severe COPD, is O2 dependent per 12/01 H&P,     Possible Diagnosis? . Document acuity: --Acute Respiratory Failure --Chronic Respiratory Failure --Acute on Chronic Respiratory Failure . Document inclusion of: --Hypoxia --Hypercapnia . Document tobacco: --Use --Abuse --History of . Document any associated diagnoses/conditions    Thank Gabriel Cirri Documentation Specialist [phone] [email or web address]

## 2014-04-08 NOTE — Progress Notes (Addendum)
       [address]       [name] [address]             [phone]          5 Days Post-Op Procedure(s) (LRB): TRANSCATHETER AORTIC VALVE REPLACEMENT, TRANSFEMORAL (N/A) INTRAOPERATIVE TRANSESOPHAGEAL ECHOCARDIOGRAM (N/A)  Subjective: No complaints today.  Feels like his swelling is getting better.   Objective: Vital signs in last 24 hours: Patient Vitals for the past 24 hrs:  BP Temp Temp src Pulse Resp SpO2 Height Weight  04/08/14 0326 137/77 mmHg 98.6 F (37 C) Oral 100 18 95 % 5\' 11"  (1.803 m) 288 lb (130.636 kg)  04/07/14 2114 (!) 105/44 mmHg 98.7 F (37.1 C) Oral 87 18 92 % - -  04/07/14 2050 - - - 86 18 - - -  04/07/14 1611 (!) 145/90 mmHg 98.6 F (37 C) Oral 96 18 92 % - -   Current Weight  04/08/14 288 lb (130.636 kg)  Admission wt= 128 kg   Intake/Output from previous day: 12/06 0701 - 12/07 0700 In: 480 [P.O.:480] Out: -     PHYSICAL EXAM:  Heart: Irr irr Lungs: Crackles in bases Wound: Groin stable Extremities: +LE edema    Lab Results: CBC: Recent Labs  04/06/14 0355  WBC 6.9  HGB 10.5*  HCT 34.1*  PLT 74*   BMET:  Recent Labs  04/07/14 0441 04/08/14 0359  NA 138 135*  K 3.6* 3.8  CL 99 90*  CO2 27 28  GLUCOSE 98 95  BUN 15 14  CREATININE 0.79 0.80  CALCIUM 8.8 9.1    PT/INR: No results for input(s): LABPROT, INR in the last 72 hours.    Assessment/Plan: S/P Procedure(s) (LRB): TRANSCATHETER AORTIC VALVE REPLACEMENT, TRANSFEMORAL (N/A) INTRAOPERATIVE TRANSESOPHAGEAL ECHOCARDIOGRAM (N/A)  CV- AF, still having episodes of HR 120-140s. Currently on Toprol, Cardizem, and had 3 doses of IV Dig over the weekend.  Since his BPs are improving, may need to consider titration of meds vs adding po Dig for better rate control.  Acute diastolic HF- He remains edematous but feels that this is better. UOP not accurately recorded.  Wt 2 kg above admission. Continue Lasix/Zaroxylyn per cardiology.  Will check  pro-BNP.  DM- sugars stable. Continue current meds.  Disp- home once volume status and rhythm issues better controlled.   LOS: 5 days    COLLINS,GINA H 04/08/2014  I have seen and examined the patient and agree with the assessment and plan as outlined.  Still looks a bit fluid overloaded - receiving IV lasix.  Hasn't been walking much other than to get to the bathroom.  Will get cardiac rehab team involved but hopefully home soon.  Jenniffer Vessels H 04/08/2014 9:03 AM

## 2014-04-09 ENCOUNTER — Telehealth: Payer: Self-pay | Admitting: Cardiovascular Disease

## 2014-04-09 ENCOUNTER — Other Ambulatory Visit: Payer: Self-pay | Admitting: Physician Assistant

## 2014-04-09 DIAGNOSIS — I5032 Chronic diastolic (congestive) heart failure: Secondary | ICD-10-CM

## 2014-04-09 DIAGNOSIS — R509 Fever, unspecified: Secondary | ICD-10-CM

## 2014-04-09 DIAGNOSIS — I5033 Acute on chronic diastolic (congestive) heart failure: Secondary | ICD-10-CM

## 2014-04-09 DIAGNOSIS — I10 Essential (primary) hypertension: Secondary | ICD-10-CM

## 2014-04-09 DIAGNOSIS — I483 Typical atrial flutter: Secondary | ICD-10-CM

## 2014-04-09 LAB — GLUCOSE, CAPILLARY
GLUCOSE-CAPILLARY: 161 mg/dL — AB (ref 70–99)
Glucose-Capillary: 120 mg/dL — ABNORMAL HIGH (ref 70–99)

## 2014-04-09 LAB — BASIC METABOLIC PANEL
Anion gap: 12 (ref 5–15)
BUN: 15 mg/dL (ref 6–23)
CHLORIDE: 88 meq/L — AB (ref 96–112)
CO2: 33 mEq/L — ABNORMAL HIGH (ref 19–32)
Calcium: 8.8 mg/dL (ref 8.4–10.5)
Creatinine, Ser: 0.82 mg/dL (ref 0.50–1.35)
GFR calc Af Amer: >90 mL/min (ref >90)
GFR, EST NON AFRICAN AMERICAN: 88 mL/min — AB (ref >90)
GLUCOSE: 111 mg/dL — AB (ref 70–99)
Potassium: 3.1 mEq/L — ABNORMAL LOW (ref 3.7–5.3)
Sodium: 133 mEq/L — ABNORMAL LOW (ref 137–147)

## 2014-04-09 MED ORDER — DILTIAZEM HCL 30 MG PO TABS
30.0000 mg | ORAL_TABLET | Freq: Three times a day (TID) | ORAL | Status: DC
  Filled 2014-04-09 (×3): qty 1

## 2014-04-09 MED ORDER — POTASSIUM CHLORIDE CRYS ER 20 MEQ PO TBCR: 40.0000 meq | EXTENDED_RELEASE_TABLET | Freq: Two times a day (BID) | ORAL | Status: DC

## 2014-04-09 MED ORDER — POLYETHYLENE GLYCOL 3350 17 G PO PACK: 17.0000 g | PACK | Freq: Every day | ORAL | Status: DC | PRN

## 2014-04-09 MED ORDER — POTASSIUM CHLORIDE CRYS ER 20 MEQ PO TBCR: 20.0000 meq | EXTENDED_RELEASE_TABLET | Freq: Two times a day (BID) | ORAL | Status: DC

## 2014-04-09 MED ORDER — METFORMIN HCL 500 MG PO TABS
1000.0000 mg | ORAL_TABLET | Freq: Two times a day (BID) | ORAL | Status: DC
  Administered 2014-04-09: 1000 mg via ORAL
  Filled 2014-04-09 (×3): qty 2

## 2014-04-09 MED ORDER — FUROSEMIDE 10 MG/ML IJ SOLN: 80.0000 mg | Freq: Two times a day (BID) | INTRAMUSCULAR | Status: DC

## 2014-04-09 MED ORDER — FUROSEMIDE 80 MG PO TABS: 80.0000 mg | ORAL_TABLET | Freq: Two times a day (BID) | ORAL | Status: DC

## 2014-04-09 MED ORDER — BISACODYL 5 MG PO TBEC: 10.0000 mg | DELAYED_RELEASE_TABLET | Freq: Every day | ORAL | Status: DC | PRN

## 2014-04-09 MED ORDER — POTASSIUM CHLORIDE CRYS ER 20 MEQ PO TBCR
40.0000 meq | EXTENDED_RELEASE_TABLET | Freq: Two times a day (BID) | ORAL | Status: DC
  Administered 2014-04-09: 40 meq via ORAL

## 2014-04-09 NOTE — Telephone Encounter (Signed)
TCM [facility] 04/10/14

## 2014-04-09 NOTE — Progress Notes (Signed)
Subjective:  No CP/SOB, slowly diuresing, S/P TAVR  Objective:  Temp:  [98.5 F (36.9 C)-99.8 F (37.7 C)] 98.5 F (36.9 C) (12/08 0529) Pulse Rate:  [84-91] 86 (12/08 0613) Resp:  [18] 18 (12/08 0529) BP: (80-131)/(43-57) 108/55 mmHg (12/08 1218) SpO2:  [93 %-94 %] 94 % (12/08 0906) Weight:  [280 lb 11.2 oz (127.325 kg)] 280 lb 11.2 oz (127.325 kg) (12/08 0529) Weight change: -7 lb 4.8 oz (-3.311 kg)  Intake/Output from previous day: 12/07 0701 - 12/08 0700 In: 480 [P.O.:480] Out: 2075 [Urine:2075]  Intake/Output from this shift:    Physical Exam: General appearance: alert and no distress Neck: no adenopathy, no carotid bruit, no JVD, supple, symmetrical, trachea midline and thyroid not enlarged, symmetric, no tenderness/mass/nodules Lungs: clear to auscultation bilaterally Heart: regular rate and rhythm, S1, S2 normal, no murmur, click, rub or gallop Extremities: 2-3 + pitting edema  Lab Results: Results for orders placed or performed during the hospital encounter of 04/03/14 (from the past 48 hour(s))  Glucose, capillary     Status: None   Collection Time: 04/07/14  4:07 PM  Result Value Ref Range   Glucose-Capillary 83 70 - 99 mg/dL  Glucose, capillary     Status: None   Collection Time: 04/07/14  9:41 PM  Result Value Ref Range   Glucose-Capillary 72 70 - 99 mg/dL  Basic metabolic panel     Status: Abnormal   Collection Time: 04/08/14  3:59 AM  Result Value Ref Range   Sodium 135 (L) 137 - 147 mEq/L   Potassium 3.8 3.7 - 5.3 mEq/L    Comment: HEMOLYSIS AT THIS LEVEL MAY AFFECT RESULT   Chloride 90 (L) 96 - 112 mEq/L    Comment: DELTA CHECK NOTED   CO2 28 19 - 32 mEq/L   Glucose, Bld 95 70 - 99 mg/dL   BUN 14 6 - 23 mg/dL   Creatinine, Ser 0.80 0.50 - 1.35 mg/dL   Calcium 9.1 8.4 - 10.5 mg/dL   GFR calc non Af Amer 89 (L) >90 mL/min   GFR calc Af Amer >90 >90 mL/min    Comment: (NOTE) The eGFR has been calculated using the CKD EPI equation. This  calculation has not been validated in all clinical situations. eGFR's persistently <90 mL/min signify possible Chronic Kidney Disease.    Anion gap 17 (H) 5 - 15  Pro b natriuretic peptide (BNP)     Status: Abnormal   Collection Time: 04/08/14  3:59 AM  Result Value Ref Range   Pro B Natriuretic peptide (BNP) 237.7 (H) 0 - 125 pg/mL  Glucose, capillary     Status: Abnormal   Collection Time: 04/08/14  5:44 AM  Result Value Ref Range   Glucose-Capillary 109 (H) 70 - 99 mg/dL  Glucose, capillary     Status: Abnormal   Collection Time: 04/08/14 11:24 AM  Result Value Ref Range   Glucose-Capillary 146 (H) 70 - 99 mg/dL  Glucose, capillary     Status: Abnormal   Collection Time: 04/08/14  4:57 PM  Result Value Ref Range   Glucose-Capillary 163 (H) 70 - 99 mg/dL  Glucose, capillary     Status: Abnormal   Collection Time: 04/08/14  9:00 PM  Result Value Ref Range   Glucose-Capillary 208 (H) 70 - 99 mg/dL   Comment 1 Notify RN   Basic metabolic panel     Status: Abnormal   Collection Time: 04/09/14  5:41 AM  Result Value Ref  Range   Sodium 133 (L) 137 - 147 mEq/L   Potassium 3.1 (L) 3.7 - 5.3 mEq/L    Comment: DELTA CHECK NOTED   Chloride 88 (L) 96 - 112 mEq/L   CO2 33 (H) 19 - 32 mEq/L   Glucose, Bld 111 (H) 70 - 99 mg/dL   BUN 15 6 - 23 mg/dL   Creatinine, Ser 0.82 0.50 - 1.35 mg/dL   Calcium 8.8 8.4 - 10.5 mg/dL   GFR calc non Af Amer 88 (L) >90 mL/min   GFR calc Af Amer >90 >90 mL/min    Comment: (NOTE) The eGFR has been calculated using the CKD EPI equation. This calculation has not been validated in all clinical situations. eGFR's persistently <90 mL/min signify possible Chronic Kidney Disease.    Anion gap 12 5 - 15  Glucose, capillary     Status: Abnormal   Collection Time: 04/09/14  6:01 AM  Result Value Ref Range   Glucose-Capillary 120 (H) 70 - 99 mg/dL  Glucose, capillary     Status: Abnormal   Collection Time: 04/09/14 11:12 AM  Result Value Ref Range    Glucose-Capillary 161 (H) 70 - 99 mg/dL    Imaging: Imaging results have been reviewed  Tele: NSR  Assessment/Plan:   1. Principal Problem: 2.   S/P TAVR (transcatheter aortic valve replacement) 3. Active Problems: 4.   Atrial flutter 5.   Acute on chronic diastolic heart failure 6.   Iron deficiency anemia 7.   Severe aortic valve stenosis 8.   Time Spent Directly with Patient:  20 minutes  Length of Stay:  LOS: 6 days   POST TAVR. Major issue has been volume overload. BP soft (108/55 this AM). Pt denies CP/SOB and has been ambulating. IV diuretics were held yesterday after good diuresis secondary to low BP. I've discussed with Dr. Burt Knack. OK for DC home today on lasix 80 mg PO BID (twice home PO dose) with close OP F/U in [place] early next week. Pt already has an OP appointment and 2D scheduled with Dr Burt Knack in a month.   Lorretta Harp 04/09/2014, 12:20 PM

## 2014-04-09 NOTE — Progress Notes (Signed)
Patient Name: [name] Date of Encounter: 04/09/2014  Principal Problem:   S/P TAVR (transcatheter aortic valve replacement) Active Problems:   Atrial flutter   Acute on chronic diastolic heart failure   Iron deficiency anemia   Severe aortic valve stenosis   Primary Cardiologist: Dr. Purvis Sheffield TAVR M.D.: Dr. Excell Seltzer  Patient Profile: 68 year old male with history of CABG and severe ASO, atrial flutter, COPD anemia and morbid obesity was admitted 12/014 TAVR. Postop had volume overload and is diuresing.  SUBJECTIVE: Still with lower extremity edema, dyspnea on exertion is improving. No chest pain  OBJECTIVE Filed Vitals:   04/08/14 2033 04/09/14 0529 04/09/14 0613 04/09/14 0906  BP: 131/57 80/43 122/52   Pulse: 91 87 86   Temp: 99.8 F (37.7 C) 98.5 F (36.9 C)    TempSrc: Oral Oral    Resp: 18 18    Height:      Weight:  280 lb 11.2 oz (127.325 kg)    SpO2: 94% 93%  94%    Intake/Output Summary (Last 24 hours) at 04/09/14 1209 Last data filed at 04/09/14 0040  Gross per 24 hour  Intake      0 ml  Output   1900 ml  Net  -1900 ml   Filed Weights   04/07/14 0505 04/08/14 0326 04/09/14 0529  Weight: 294 lb (133.358 kg) 288 lb (130.636 kg) 280 lb 11.2 oz (127.325 kg)    PHYSICAL EXAM General: Well developed, well nourished, male in no acute distress. Head: Normocephalic, atraumatic.  Neck: Supple without bruits, JVD 9 cm. Lungs:  Resp regular and unlabored, Rales bases. Heart: Irregular, S1, S2, no S3, S4, 2/6 murmur; no rub. Abdomen: Soft, non-tender, non-distended, BS + x 4.  Extremities: No clubbing, cyanosis, 2+ edema.  Neuro: Alert and oriented X 3. Moves all extremities spontaneously. Psych: Normal affect.  LABS: Basic Metabolic Panel:  Recent Labs  24/23/53 0359 04/09/14 0541  NA 135* 133*  K 3.8 3.1*  CL 90* 88*  CO2 28 33*  GLUCOSE 95 111*  BUN 14 15  CREATININE 0.80 0.82  CALCIUM 9.1 8.8   BNP: PRO B NATRIURETIC PEPTIDE  (BNP)  Date/Time Value Ref Range Status  04/08/2014 03:59 AM 237.7* 0 - 125 pg/mL Final    TELE:   Atrial fibrillation/atrial flutter, bradycardic at times.     Current Medications:  . arformoterol  15 mcg Nebulization Q12H  . aspirin EC  81 mg Oral Daily  . budesonide  0.25 mg Nebulization BID  . clopidogrel  75 mg Oral Q breakfast  . diltiazem  30 mg Oral 4 times per day  . docusate sodium  200 mg Oral Daily  . ezetimibe  10 mg Oral QHS  . insulin aspart  0-24 Units Subcutaneous TID WC  . insulin detemir  40 Units Subcutaneous BID  . loratadine  10 mg Oral Daily  . metFORMIN  1,000 mg Oral BID WC  . metoprolol succinate  25 mg Oral BID  . moving right along book   Does not apply Once  . polyethylene glycol  17 g Oral Daily  . potassium chloride  40 mEq Oral BID  . predniSONE  5 mg Oral Q breakfast  . sodium chloride  3 mL Intravenous Q12H      ASSESSMENT AND PLAN: Principal Problem:   S/P TAVR (transcatheter aortic valve replacement) - doing well postop, continue to increase activity  Active Problems:   Severe aortic valve stenosis - see  above    Acute on chronic diastolic CHF - patient still with significant volume overload, Lasix discontinued last night. Will restart at 80 mg twice a day. Continue to follow renal function, weights, intake/output.    Atrial flutter - slow VR at times. He takes diltiazem 30 mg 3 times a day, but a.m. dose was held due to bradycardia. Will decrease to 3 times a day    Iron deficiency anemia - has been 5 days since his last CBC, recheck in a.m.  Plan: Discharge when medically stable, possibly in 48 hours  Melida Quitter , PA-C 12:09 PM 04/09/2014

## 2014-04-09 NOTE — Progress Notes (Signed)
CARDIAC REHAB PHASE I   PRE:  Rate/Rhythm: 97 Afib  BP:  Supine:   Sitting: 105/53  Standing:    SaO2: 92 RA  MODE:  Ambulation: 460 ft   POST:  Rate/Rhythm: 118  BP:  Supine:   Sitting:   Standing:    SaO2: 91 RA with rest 94RA 1345-1425 Assisted X 1 and used rollator to ambulate. Gait steady with rollator. Pt able to walk 460 feet with one sitting rest stop.Lowest room air sat 91% with walking and HR 118. Pt back to chair after walk with call light in reach and wife present. Completed discharge education with pt and wife. We discussed chewing  tobacco cessation. I gave him tips for quitting, coaching  contact number and quit smart class information. Pt voices no interest in quitting. I gave pt exercise guidelines and we discussed CHF.  Melina Copa RN 04/09/2014 2:24 PM

## 2014-04-09 NOTE — Discharge Summary (Signed)
CARDIOLOGY DISCHARGE SUMMARY   Patient ID: [name] MRN: [id number] DOB/AGE: December 21, 1945 68 y.o.  Admit date: 04/03/2014 Discharge date: 04/09/2014  PCP: Donzetta Sprung, MD Primary Cardiologist: Dr. Purvis Sheffield TAVR cardiologist: Dr. Excell Seltzer  Primary Discharge Diagnosis: S/P TAVR (transcatheter aortic valve replacement)  Secondary Discharge Diagnosis:    Atrial flutter   Acute on chronic diastolic heart failure   Iron deficiency anemia   Severe aortic valve stenosis   Hypokalemia  Consults: None  Procedures: Transcatheter Aortic Valve Replacement - Open Left Transfemoral Approach withEdwards Sapien 3 Transcatheter Heart Valve (size 29 mm, model # 9600TFX, serial # [id number]), chest x-rays, intra-operative transesophageal echocardiogram, 2-D echocardiogram  Hospital Course: [name] is a 68 y.o. male with a history of CAD, hx CABG and severe AS, atrial flutter, COPD anemia and morbid obesity. He was evaluated in the TAVR clinic and felt a candidate for the procedure. He did the preprocedure testing and came to the hospital for the procedure on 04/03/2014.  He had the valve scribed above inserted without immediate complication. Postoperatively, he developed wheezing and had some hypoventilation. He was placed on BiPAP and given nebulizers. He was tachycardic and this was treated with IV Lopressor. He was in atrial flutter. His blood sugars were managed with an insulin drip.  On 12/03, the insulin drip was discontinued and he was started on Levemir plus SSI. He was on a diabetic diet. He is to resume his home dose of metformin and 70/30 at discharge.  He developed some volume overload and IV Lasix was started. His weight was noted to have gone up 15 pounds since admission. He was significantly edematous.  Shortness of breath gradually improved. He was seen by cardiac rehabilitation and ambulated him. His ability to ambulate gradually improved. He lost 17 pounds with  diuresis. He developed hypokalemia with diuresis and this was supplemented.  He remained in atrial fibrillation/atrial flutter. He is not felt to be an anticoagulation candidate due to anemia and esophageal varices. He was on diltiazem and the dose had been increased for better rate control, but the dose had to be decreased due to bradycardia. His home dose will be unchanged.  On 12/08, he was seen by Dr. Allyson Sabal and all data were reviewed. His ability to ambulate had significantly improved and it was felt he could continue to recover at home, with early outpatient follow-up. His condition is improved and he is considered stable for discharge, to follow-up in the [facility] office and with Dr. Excell Seltzer.  Labs:   ABG    Component Value Date/Time   PHART 7.383 04/03/2014 1406   PCO2ART 49.1* 04/03/2014 1406   PO2ART 102.0* 04/03/2014 1406   HCO3 29.2* 04/03/2014 1406   TCO2 24 04/03/2014 2019   ACIDBASEDEF 0.0 12/27/2008 1412   O2SAT 98.0 04/03/2014 1406    Lab Results  Component Value Date   WBC 6.9 04/06/2014   HGB 10.5* 04/06/2014   HCT 34.1* 04/06/2014   MCV 90.0 04/06/2014   PLT 74* 04/06/2014     Recent Labs Lab 04/09/14 0541  NA 133*  K 3.1*  CL 88*  CO2 33*  BUN 15  CREATININE 0.82  CALCIUM 8.8  GLUCOSE 111*   PRO B NATRIURETIC PEPTIDE (BNP)  Date/Time Value Ref Range Status  04/08/2014 03:59 AM 237.7* 0 - 125 pg/mL Final      Radiology: Dg Chest 2 View 04/01/2014   CLINICAL DATA:  History of aortic stenosis ; preoperative exam prior to  valve replacement; history of previous CABG  EXAM: CHEST  2 VIEW  COMPARISON:  Portable chest x-ray of February 19, 2014.  FINDINGS: The lungs are adequately inflated. The interstitial markings are coarse bilaterally and have become slightly more conspicuous since the previous study. There is stable scarring at the right lung base. There is no alveolar pneumonia. There is no pleural effusion. The heart and pulmonary vascularity are normal.  There are 7 intact sternal wires. There are numerous surgical clips within the mediastinum. The bony thorax exhibits degenerative disc change at multiple thoracic levels. The patient has undergone previous lower cervical fusion procedures.  IMPRESSION: Chronically increased interstitial markings likely reflect the sequelae of the patient's smoking history. There is no pneumonia, CHF, nor other acute cardiopulmonary abnormality.   Electronically Signed   By: David  [place]   On: 04/01/2014 13:50   Dg Chest Port 1 View 04/04/2014   CLINICAL DATA:  Aortic valve replacement.  EXAM: PORTABLE CHEST - 1 VIEW  COMPARISON:  04/03/2014.  FINDINGS: Right IJ sheath in good anatomic position. Mediastinum hilar structures normal. Prior CABG. Heart size stable. Interval improvement of pulmonary venous congestion interstitial edema. No pneumothorax. No acute osseous abnormality. Prior cervical spine fusion.  IMPRESSION: 1. Right IJ sheath in good anatomic position.  2. Prior CABG. Interim resolution of pulmonary venous congestion and pulmonary interstitial edema.   Electronically Signed   By: Maisie Fus  Register   On: 04/04/2014 07:55   Dg Chest Port 1 View 04/03/2014   CLINICAL DATA:  Status post transcatheter aortic valve replacement today.  EXAM: PORTABLE CHEST - 1 VIEW  COMPARISON:  PA and lateral chest 04/01/2014.  FINDINGS: Right IJ approach Swan-Ganz catheter is in place with the tip of the catheter in the distal right main pulmonary artery. Heart size is upper normal with interstitial pulmonary edema identified. No pneumothorax or pleural fluid. The patient is status post CABG.  IMPRESSION: Swan-Ganz catheter tip projects in the distal right main pulmonary artery.  Interstitial pulmonary edema.   Electronically Signed   By: Drusilla Kanner M.D.   On: 04/03/2014 13:05   FOLLOW UP PLANS AND APPOINTMENTS No Known Allergies   Medication List    TAKE these medications        ascorbic acid 500 MG tablet  Commonly known  as:  VITAMIN C  Take 500 mg by mouth daily.     aspirin 81 MG tablet  Take 1 tablet (81 mg total) by mouth daily.     bisacodyl 5 MG EC tablet  Commonly known as:  DULCOLAX  Take 2 tablets (10 mg total) by mouth daily as needed for moderate constipation (Give daily if no BM).     budesonide 0.25 MG/2ML nebulizer solution  Commonly known as:  PULMICORT  Take 0.25 mg by nebulization 2 (two) times daily.     cephALEXin 500 MG capsule  Commonly known as:  KEFLEX  Take 1,000 mg by mouth 2 (two) times daily. for 10 days     cetirizine 10 MG tablet  Commonly known as:  ZYRTEC  Take 10 mg by mouth at bedtime.     chlorhexidine 0.12 % solution  Commonly known as:  PERIDEX  Rinse with 15 mls twice daily for 30 seconds. Use after breakfast and at bedtime. Spit out excess. Do not swallow.     co-enzyme Q-10 30 MG capsule  Take 30 mg by mouth at bedtime.     ipratropium-albuterol 0.5-2.5 (3) MG/3ML Soln  Commonly known as:  DUONEB  Take 3 mLs by nebulization every 6 (six) hours as needed (for shortness of breath).     COMBIVENT 18-103 MCG/ACT inhaler  Generic drug:  albuterol-ipratropium  Inhale 2 puffs into the lungs every 6 (six) hours as needed for wheezing or shortness of breath.     desonide 0.05 % cream  Commonly known as:  DESOWEN  Apply 1 application topically 2 (two) times daily as needed (for rash).     diltiazem 30 MG tablet  Commonly known as:  CARDIZEM  Take 1 tablet (30 mg total) by mouth 2 (two) times daily.     ezetimibe 10 MG tablet  Commonly known as:  ZETIA  Take 10 mg by mouth at bedtime.     formoterol 20 MCG/2ML nebulizer solution  Commonly known as:  PERFOROMIST  Take 2 mLs (20 mcg total) by nebulization 2 (two) times daily. Use in nebulizer twice daily perfectly regularly     furosemide 80 MG tablet  Commonly known as:  LASIX  Take 1 tablet (80 mg total) by mouth 2 (two) times daily.     insulin NPH-regular Human (70-30) 100 UNIT/ML injection    Commonly known as:  NOVOLIN 70/30  Inject 80 Units into the skin 2 (two) times daily.     ketoconazole 2 % cream  Commonly known as:  NIZORAL  Apply 1 application topically 2 (two) times daily as needed for irritation.     loratadine 10 MG tablet  Commonly known as:  CLARITIN  Take 10 mg by mouth every morning.     metFORMIN 1000 MG tablet  Commonly known as:  GLUCOPHAGE  Take 1 tablet (1,000 mg total) by mouth 2 (two) times daily. Hold for 48 hours, restart on 02/10/2014.     metoprolol succinate 25 MG 24 hr tablet  Commonly known as:  TOPROL-XL  Take 1 tablet (25 mg total) by mouth 2 (two) times daily.     multivitamin tablet  Take 1 tablet by mouth daily.     oxyCODONE-acetaminophen 5-325 MG per tablet  Commonly known as:  PERCOCET/ROXICET  Take 1-2 tablets by mouth every 4 (four) hours as needed for moderate pain.     polyethylene glycol packet  Commonly known as:  MIRALAX / GLYCOLAX  Take 17 g by mouth daily as needed for moderate constipation.     potassium chloride SA 20 MEQ tablet  Commonly known as:  KLOR-CON M20  Take 1 tablet (20 mEq total) by mouth 2 (two) times daily.     predniSONE 5 MG tablet  Commonly known as:  DELTASONE  Take 5 mg by mouth daily with breakfast.         Follow-up Information    Follow up with Laqueta Linden, MD On 04/11/2014.   Specialty:  Cardiology   Why:  at 11:40 am   Contact information:   [address] [place] [address] [phone]       Follow up with Tonny Bollman, MD On 05/29/2014.   Specialty:  Cardiology   Why:  Echocardiogram at 2 PM, M.D. visit at 3:15.   Contact information:   [address] [place] [address] [phone]       BRING ALL MEDICATIONS WITH YOU TO FOLLOW UP APPOINTMENTS  Time spent with patient to include physician time: 45 min Signed: Theodore Demark, PA-C 04/09/2014, 1:12 PM Co-Sign MD  Runell Gess, M.D., FACP, [facility], [facility]  [facility] [address] [place], [place]  [address]  [phone] 04/09/2014 2:01 PM

## 2014-04-09 NOTE — Progress Notes (Addendum)
       [address]       [place] [address]             [phone]          6 Days Post-Op Procedure(s) (LRB): TRANSCATHETER AORTIC VALVE REPLACEMENT, TRANSFEMORAL (N/A) INTRAOPERATIVE TRANSESOPHAGEAL ECHOCARDIOGRAM (N/A)  Subjective: Stable night, c/o rheumatoid arthritis flare this am.     Objective: Vital signs in last 24 hours: Patient Vitals for the past 24 hrs:  BP Temp Temp src Pulse Resp SpO2 Weight  04/09/14 0613 (!) 122/52 mmHg - - 86 - - -  04/09/14 0529 (!) 80/43 mmHg 98.5 F (36.9 C) Oral 87 18 93 % 280 lb 11.2 oz (127.325 kg)  04/08/14 2033 (!) 131/57 mmHg 99.8 F (37.7 C) Oral 91 18 94 % -  04/08/14 1500 (!) 129/50 mmHg 99.7 F (37.6 C) Oral 84 18 94 % -  04/08/14 0946 (!) 132/53 mmHg - - 96 - - -  04/08/14 0901 - - - - - 94 % -   Current Weight  04/09/14 280 lb 11.2 oz (127.325 kg)  Admission wt= 128 kg   Intake/Output from previous day: 12/07 0701 - 12/08 0700 In: 480 [P.O.:480] Out: 2075 [Urine:2075]  CBGs 163-208-111-120   PHYSICAL EXAM:  Heart: Irr irr Wound: Groin stable with resolving ecchymosis, incision clean and dry Extremities: +LE edema    Lab Results: CBC:No results for input(s): WBC, HGB, HCT, PLT in the last 72 hours. BMET:  Recent Labs  04/08/14 0359 04/09/14 0541  NA 135* 133*  K 3.8 3.1*  CL 90* 88*  CO2 28 33*  GLUCOSE 95 111*  BUN 14 15  CREATININE 0.80 0.82  CALCIUM 9.1 8.8    PT/INR: No results for input(s): LABPROT, INR in the last 72 hours.    Assessment/Plan: S/P Procedure(s) (LRB): TRANSCATHETER AORTIC VALVE REPLACEMENT, TRANSFEMORAL (N/A) INTRAOPERATIVE TRANSESOPHAGEAL ECHOCARDIOGRAM (N/A)  CV- AF, rates better controlled.  Continue current meds.  Diastolic HF- He remains edematous but improved from yesterday. UOP excellent. Continue diuresis per cardiology.   DM- sugars trending up.  Will resume Metformin since Cr stable, continue Levemir and titrate as needed. Restart  70/30 at discharge.  Hypokalemia- will give additional K+ today.  Disp- home when ok with cardiology.    LOS: 6 days    COLLINS,GINA H 04/09/2014  I have seen and examined the patient and agree with the assessment and plan as outlined.  [name],[name] 04/09/2014 8:30 AM

## 2014-04-10 ENCOUNTER — Other Ambulatory Visit: Payer: Self-pay | Admitting: Cardiovascular Disease

## 2014-04-10 ENCOUNTER — Telehealth: Payer: Self-pay | Admitting: *Deleted

## 2014-04-10 ENCOUNTER — Ambulatory Visit (HOSPITAL_COMMUNITY)
Admission: RE | Admit: 2014-04-10 | Discharge: 2014-04-10 | Disposition: A | Discharge status: Home / Self Care | Payer: Medicare Other | Source: Ambulatory Visit | Attending: Cardiovascular Disease | Admitting: Cardiovascular Disease

## 2014-04-10 DIAGNOSIS — J449 Chronic obstructive pulmonary disease, unspecified: Secondary | ICD-10-CM | POA: Diagnosis not present

## 2014-04-10 DIAGNOSIS — I10 Essential (primary) hypertension: Secondary | ICD-10-CM

## 2014-04-10 DIAGNOSIS — R509 Fever, unspecified: Secondary | ICD-10-CM

## 2014-04-10 NOTE — Telephone Encounter (Signed)
Patient contacted regarding discharge from [facility] on 04/09/14.  Patient does understand to follow up with provider Dr. Purvis Sheffield on Thursday, 04/11/14 at 11:40 am at [facility]. Patient does understand his discharge instructions. Patient understands his medications and regiment. Patient understands to bring all medications to this visit.  Patient c/o sharp pains in both shoulders, chills during the night with elevated temp 101.4. Patient took pain medication and temp came down to 100.4 and pain improved. Per wife, patient does have RA. No c/o chest pain, dizziness or sob. Patient lost an additional 3-4 lbs since getting home from heart surgery. Patient is currently on Keflex 500 mg TID that was given to him prior to admission for a dental infection. Patient wasn't taking it while in hospital but restarted it this morning. Prior to admission, patient was taking keflex for 3 weeks. Please advise.

## 2014-04-10 NOTE — Telephone Encounter (Signed)
Patient's wife informed and verbalized understanding of plan. Lab orders faxed to [facility] lab [phone] and [facility] radiology [phone].

## 2014-04-10 NOTE — Telephone Encounter (Signed)
Patient's wife informed

## 2014-04-10 NOTE — Telephone Encounter (Signed)
Would get a CBC, UA/UC, blood cultures, and chest xray.

## 2014-04-10 NOTE — Telephone Encounter (Signed)
Patient wife called.  Patient is running a fever of 100.4 this is after taking medicine to help bring down. Was just released from [facility] yesterday.  Currently experiencing shoulder/back/chest pain. She is very concerned and wants to know what she needs to do

## 2014-04-10 NOTE — Telephone Encounter (Signed)
-----   Message from Laqueta Linden, MD sent at 04/10/2014  1:18 PM EST ----- No active process.

## 2014-04-11 ENCOUNTER — Telehealth: Payer: Self-pay | Admitting: *Deleted

## 2014-04-11 ENCOUNTER — Encounter (HOSPITAL_COMMUNITY): Payer: Self-pay | Admitting: Cardiology

## 2014-04-11 ENCOUNTER — Ambulatory Visit (INDEPENDENT_AMBULATORY_CARE_PROVIDER_SITE_OTHER): Payer: Medicare Other | Admitting: Cardiovascular Disease

## 2014-04-11 VITALS — BP 160/80 | HR 115 | Ht 71.0 in | Wt 280.0 lb

## 2014-04-11 DIAGNOSIS — I25810 Atherosclerosis of coronary artery bypass graft(s) without angina pectoris: Secondary | ICD-10-CM

## 2014-04-11 DIAGNOSIS — IMO0001 Reserved for inherently not codable concepts without codable children: Secondary | ICD-10-CM

## 2014-04-11 DIAGNOSIS — J438 Other emphysema: Secondary | ICD-10-CM

## 2014-04-11 DIAGNOSIS — E119 Type 2 diabetes mellitus without complications: Secondary | ICD-10-CM

## 2014-04-11 DIAGNOSIS — Z952 Presence of prosthetic heart valve: Secondary | ICD-10-CM

## 2014-04-11 DIAGNOSIS — R600 Localized edema: Secondary | ICD-10-CM

## 2014-04-11 DIAGNOSIS — E785 Hyperlipidemia, unspecified: Secondary | ICD-10-CM

## 2014-04-11 DIAGNOSIS — I4891 Unspecified atrial fibrillation: Secondary | ICD-10-CM

## 2014-04-11 DIAGNOSIS — I5033 Acute on chronic diastolic (congestive) heart failure: Secondary | ICD-10-CM

## 2014-04-11 DIAGNOSIS — Z794 Long term (current) use of insulin: Secondary | ICD-10-CM

## 2014-04-11 DIAGNOSIS — E876 Hypokalemia: Secondary | ICD-10-CM

## 2014-04-11 DIAGNOSIS — M069 Rheumatoid arthritis, unspecified: Secondary | ICD-10-CM

## 2014-04-11 DIAGNOSIS — I10 Essential (primary) hypertension: Secondary | ICD-10-CM

## 2014-04-11 DIAGNOSIS — R509 Fever, unspecified: Secondary | ICD-10-CM

## 2014-04-11 MED ORDER — DILTIAZEM HCL 60 MG PO TABS: 60.0000 mg | ORAL_TABLET | Freq: Two times a day (BID) | ORAL | Status: DC

## 2014-04-11 MED ORDER — TORSEMIDE 20 MG PO TABS: ORAL_TABLET | ORAL | Status: DC

## 2014-04-11 MED ORDER — CLOPIDOGREL BISULFATE 75 MG PO TABS: 75.0000 mg | ORAL_TABLET | Freq: Every day | ORAL | Status: DC

## 2014-04-11 NOTE — Telephone Encounter (Signed)
-----   Message from Laqueta Linden, MD sent at 04/10/2014  5:11 PM EST ----- Would have him see PCP. K is low. Needs fluids too as labs suggest some degree of dehyrdation. Would give 60 meq KCl x one dose and repeat BMET. Hgb mildly low. Normal WBC count. Platelets low at 99.

## 2014-04-11 NOTE — Telephone Encounter (Signed)
Patient's wife informed and verbalized understanding of plan. 

## 2014-04-11 NOTE — Progress Notes (Signed)
Patient ID: [name], male   DOB: [birth year], 68 y.o.   MRN: [id number]      SUBJECTIVE: The patient presents for follow-up after successfully undergoing transcatheter aortic valve replacement. He called complaining of sharp pains in both shoulders as well as chills and a fever of 101.4. He denied chest pain, dizziness, and shortness of breath. He is on Keflex for a dental infection. I obtained a chest x-ray, CBC, urinalysis, urine culture, and blood cultures.  Chest x-ray did not demonstrate an active infectious process. There were chronic changes. Urinalysis was normal. White blood cell count was normal. Hemoglobin 10.8, platelets 99 (up from 74 on 12/5). Potassium was low at 3.3. BUN 19, creatinine 0.89.  I informed the patient that there was some degree of dehydration based on prerenal BUN/SCr ratio. He is on Lasix 80 mg bid but has not been urinating much. I also prescribed KCL 60 meq x one dose with a repeat BMET. I told him he should see his PCP as well as there was concern for a flare of rheumatoid arthritis, for which he may require increased steroids. He currently takes prednisone 5 mg daily as maintenance therapy.  He also has a history of coronary artery disease and CABG, COPD, and atrial fibrillation/flutter as well as cirrhosis with portal hypertension and rheumatoid arthritis. He was deemed to be a poor candidate for anticoagulation given a history of anemia requiring transfusions as well as esophageal varices and is not on warfarin. He was given Plavix during hospitalization but it was not provided at discharge.  Coronary angiography in 01/2014 prior to TAVR showed svere 3 vessel obstructive CAD, patent LIMA to the LAD, patent SVG to RCA, patent SVG to diagonal with 80% proximal stenosis, and patent SVG to the OM1 and OM2, with 80% stenosis in continuation of graft to OM2.  He currently denies fevers, chest pain, and shortness of breath. He has diffuse joint pain and believes  he is experiencing a flare of rheumatoid arthritis. He has not been urinating much in spite of taking Lasix 80 mg twice daily. He has scrotal swelling although this has been decreasing in size. He has marked leg swelling.   He is going to see his PCP, Dr. Reuel Boom, on Monday, 04/15/14.   Review of Systems: As per "subjective", otherwise negative.  No Known Allergies  Current Outpatient Prescriptions  Medication Sig Dispense Refill  . albuterol-ipratropium (COMBIVENT) 18-103 MCG/ACT inhaler Inhale 2 puffs into the lungs every 6 (six) hours as needed for wheezing or shortness of breath.     Marland Kitchen ascorbic acid (VITAMIN C) 500 MG tablet Take 500 mg by mouth daily.     Marland Kitchen aspirin 81 MG tablet Take 1 tablet (81 mg total) by mouth daily.    . bisacodyl (DULCOLAX) 5 MG EC tablet Take 2 tablets (10 mg total) by mouth daily as needed for moderate constipation (Give daily if no BM). 30 tablet 0  . budesonide (PULMICORT) 0.25 MG/2ML nebulizer solution Take 0.25 mg by nebulization 2 (two) times daily.    . cephALEXin (KEFLEX) 500 MG capsule Take 1,000 mg by mouth 2 (two) times daily. for 10 days    . cetirizine (ZYRTEC) 10 MG tablet Take 10 mg by mouth at bedtime.    . chlorhexidine (PERIDEX) 0.12 % solution Rinse with 15 mls twice daily for 30 seconds. Use after breakfast and at bedtime. Spit out excess. Do not swallow. 480 mL prn  . co-enzyme Q-10 30 MG capsule Take 30  mg by mouth at bedtime.     Marland Kitchen desonide (DESOWEN) 0.05 % cream Apply 1 application topically 2 (two) times daily as needed (for rash).     Marland Kitchen diltiazem (CARDIZEM) 30 MG tablet Take 1 tablet (30 mg total) by mouth 2 (two) times daily. 180 tablet 3  . ezetimibe (ZETIA) 10 MG tablet Take 10 mg by mouth at bedtime.     . formoterol (PERFOROMIST) 20 MCG/2ML nebulizer solution Take 2 mLs (20 mcg total) by nebulization 2 (two) times daily. Use in nebulizer twice daily perfectly regularly 120 mL 11  . furosemide (LASIX) 80 MG tablet Take 1 tablet (80 mg  total) by mouth 2 (two) times daily. 60 tablet 11  . insulin NPH-insulin regular (NOVOLIN 70/30) (70-30) 100 UNIT/ML injection Inject 80 Units into the skin 2 (two) times daily.     Marland Kitchen ipratropium-albuterol (DUONEB) 0.5-2.5 (3) MG/3ML SOLN Take 3 mLs by nebulization every 6 (six) hours as needed (for shortness of breath).    Marland Kitchen ketoconazole (NIZORAL) 2 % cream Apply 1 application topically 2 (two) times daily as needed for irritation.     Marland Kitchen loratadine (CLARITIN) 10 MG tablet Take 10 mg by mouth every morning.     . metFORMIN (GLUCOPHAGE) 1000 MG tablet Take 1 tablet (1,000 mg total) by mouth 2 (two) times daily. Hold for 48 hours, restart on 02/10/2014.    . metoprolol succinate (TOPROL-XL) 25 MG 24 hr tablet Take 1 tablet (25 mg total) by mouth 2 (two) times daily. 60 tablet 6  . Multiple Vitamin (MULTIVITAMIN) tablet Take 1 tablet by mouth daily.      Marland Kitchen oxyCODONE-acetaminophen (PERCOCET/ROXICET) 5-325 MG per tablet Take 1-2 tablets by mouth every 4 (four) hours as needed for moderate pain. 40 tablet 0  . polyethylene glycol (MIRALAX / GLYCOLAX) packet Take 17 g by mouth daily as needed for moderate constipation. 14 each 0  . potassium chloride SA (KLOR-CON M20) 20 MEQ tablet Take 1 tablet (20 mEq total) by mouth 2 (two) times daily. 90 tablet 3  . predniSONE (DELTASONE) 5 MG tablet Take 5 mg by mouth daily with breakfast.     No current facility-administered medications for this visit.    Past Medical History  Diagnosis Date  . Rheumatoid arthritis(714.0)     chronic Prednisone  . COPD (chronic obstructive pulmonary disease)   . Elevated liver function tests   . Hypertension   . Aortic stenosis     a. 2D ECHO: 01/30/2014: ef 50-55%. No RWMAs. Severe EV:OJJKK area (VTI): 0.82 cm^2. Valve area (Vmax): 0.74 cm^2. Valve area (Vmean): 0.75 Cm^2. Mod LA/RA dilation. PA peak pressure 33  . CAD (coronary artery disease)   . GERD (gastroesophageal reflux disease)   . Chronic atrial flutter   . S/P  CABG x 5 03/27/1997    LIMA to LAD, SVG to D1, SVG to OM1-OM2, SVG to RCA with open vein harvest from right thigh and leg  . Degenerative disc disease, cervical   . Degenerative disc disease, lumbar 2010  . Morbid obesity   . Coronary atherosclerosis of artery bypass graft 2010  . Acute on chronic diastolic heart failure 02/01/2014  . Chronic diastolic congestive heart failure 02/01/2014  . Epistaxis     Developed on Xarelto therapy  . Cirrhosis 02/01/2014  . Portal hypertension 02/01/2014  . Chronic pancreatitis 02/01/2014    Noted on CT scan  . Shortness of breath   . Dizziness   . Heart murmur   .  Asthma     uses nebulizers daily  . Diabetes mellitus     fasting blood glucose 120-130  . Pneumonia     hx of  . Dysrhythmia     fib/flutter  . Anemia     was on xalerto sept 2015 for afib - was taken off of - had to get unit of prbc  . History of blood transfusion   . 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 Coronary stent placement  01/02/2009    s/p bare metal stent to the vein graft to the RCA 9/20. nml LV function   . Appendectomy    . Coronary artery bypass graft  03/27/1997    CABG x 5 by Dr Cornelius Moras  . Anterior cervical decompression      with posterior decompression and fusion   . Tonsillectomy and adenoidectomy    . Lumbar spine surgery    . Esophagogastroduodenoscopy (egd) with propofol N/A 02/01/2014    Procedure: ESOPHAGOGASTRODUODENOSCOPY (EGD) WITH PROPOFOL;  Surgeon: Shirley Friar, MD;  Location: [facility] ENDOSCOPY;  Service: Endoscopy;  Laterality: N/A;  . Colonoscopy with propofol N/A 02/01/2014    Procedure: COLONOSCOPY WITH PROPOFOL;  Surgeon: Shirley Friar, MD;  Location: [facility] ENDOSCOPY;  Service: Endoscopy;  Laterality: N/A;  . Multiple extractions with alveoloplasty N/A 03/12/2014     Procedure: MULTIPLE EXTRACTION OF TOOTH #'S 2,6,7,8,9,10,11,14,18,20,29,31 WITH ALVEOLOPLASTY AND GROSS DEBRIDMENT OF REMAINING TEETH;  Surgeon: Charlynne Pander, DDS;  Location: [facility] OR;  Service: Oral Surgery;  Laterality: N/A;  . Coronary angioplasty      1 stent  . Transcatheter aortic valve replacement, transfemoral N/A 04/03/2014    Procedure: TRANSCATHETER AORTIC VALVE REPLACEMENT, TRANSFEMORAL;  Surgeon: Purcell Nails, MD;  Location: [facility] OR;  Service: Open Heart Surgery;  Laterality: N/A;  . Intraoperative transesophageal echocardiogram N/A 04/03/2014    Procedure: INTRAOPERATIVE TRANSESOPHAGEAL ECHOCARDIOGRAM;  Surgeon: Purcell Nails, MD;  Location: [facility] OR;  Service: Open Heart Surgery;  Laterality: N/A;  . Left and right heart catheterization with coronary/graft angiogram N/A 02/05/2014    Procedure: LEFT AND RIGHT HEART CATHETERIZATION WITH Isabel Caprice;  Surgeon: Peter M Swaziland, MD;  Location[facility] CATH LAB;  Service: Cardiovascular;  Laterality: N/A;    History   Social History  . Marital Status: Married    Spouse Name: N/A    Number of Children: N/A  . Years of Education: N/A   Occupational History  . Retired Special educational needs teacher    Social History Main Topics  . Smoking status: Former Smoker -- 3.00 packs/day for 35 years    Types: Cigarettes    Start date: 09/15/1961    Quit date: 11/23/1996  . Smokeless tobacco: Current User    Types: Chew     Comment: chews 2-3 packs per week  . Alcohol Use: No  . Drug Use: No  . Sexual Activity: Not on file   Other Topics Concern  . Not on file   Social History Narrative   Married, retired.      BP 160/80  Pulse 96 bpm by auscultation Weight 280 lb (127.007 kg) Height 5\' 11"  (1.803 m)   PHYSICAL EXAM General: NAD HEENT: Facial erythema. Neck: No JVD, no thyromegaly. Lungs: Clear to auscultation bilaterally with normal respiratory effort. CV: Nondisplaced PMI.  Irregular rhythm, normal S1/S2, no S3,  no murmur. 1-2+  pretibial and periankle edema.    Abdomen: Soft, obese, no distention.  Neurologic: Alert and oriented.  Psych: Normal affect. Skin: Facial erythema. Musculoskeletal: Diminished range of motion. Extremities: No clubbing or cyanosis.   ECG: Most recent ECG reviewed.    ASSESSMENT AND PLAN: 1. CAD: No anginal discomfort. Cath results noted above with SVG stenosis, for which medical therapy was recommended. Will resume Plavix. Will continue ASA, Zetia, and metoprolol 2. Essential HTN: Elevated today in context of flare of rheumatoid arthritis. I am increasing diltiazem for rate control of atrial fibrillation, which may also help to control BP. 3. Hyperlipidemia: On Zetia. Followed by PCP. Intolerant to Lipitor.  4. Paroxysmal atrial flutter/atrial fibrillation: HR is elevated in context of RA flare. I will increase diltiazem to 60 mg bid. He is a poor candidate for anticoagulation given portal hypertension with esophageal varices. Will resume Plavix.  5. Severe aortic stenosis s/p TAVR: Will have repeat echocardiogram in near future. 6. COPD: Followed by Dr. Sherene Sires. Appears to be stable. Uses oxygen at night. 7. Acute on chronic diastolic heart failure: Likely being worsened by rapid atrial fibrillation, due to RA flare. Will control HR with increased dose of diltiazem. He is not urinating much in spite of Lasix 80 mg bid. He is likely not absorbing it as well due to intestinal edema. I will switch to torsemide 20 mg bid with instructions to take an extra 20 mg for worsening leg swelling/weight gain. He is taking supplemental KCl, and is due to have a repeat BMET in 5 days. 8. 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 On insulin. 9. Rheumatoid arthritis flare: Due to see PCP on 04/15/14, Dr. Reuel Boom. On maintenance prednisone 5 mg daily, and will likely need a burst.  Dispo: f/u 1 month.  Time spent: 60 minutes.  Prentice Docker, M.D., F.A.C.C.

## 2014-04-11 NOTE — Telephone Encounter (Signed)
-----   Message from Laqueta Linden, MD sent at 04/10/2014  3:44 PM EST ----- Normal UA.

## 2014-04-11 NOTE — Patient Instructions (Signed)
Your physician recommends that you schedule a follow-up appointment in: 1 month with Dr. Purvis Sheffield  Your physician has recommended you make the following change in your medication:   STOP: Lasix START: Torsemide 20 mg twice daily. May take extra for leg swelling.      Diltiazem 60 mg twice daily      Plavix 75 mg one time a day   Your physician recommends that you return for lab work in:  5 day on ( 04/16/14)   Thank you for choosing  [facility]!

## 2014-04-12 LAB — URINE CULTURE: Colony Count: >=100000

## 2014-04-16 LAB — CULTURE, BLOOD (SINGLE): Organism ID, Bacteria: NO GROWTH

## 2014-04-27 ENCOUNTER — Other Ambulatory Visit: Payer: Self-pay | Admitting: Cardiovascular Disease

## 2014-05-08 DIAGNOSIS — I25810 Atherosclerosis of coronary artery bypass graft(s) without angina pectoris: Secondary | ICD-10-CM

## 2014-05-08 DIAGNOSIS — E785 Hyperlipidemia, unspecified: Secondary | ICD-10-CM

## 2014-05-08 DIAGNOSIS — J438 Other emphysema: Secondary | ICD-10-CM

## 2014-05-08 DIAGNOSIS — I10 Essential (primary) hypertension: Secondary | ICD-10-CM

## 2014-05-08 DIAGNOSIS — I4891 Unspecified atrial fibrillation: Secondary | ICD-10-CM

## 2014-05-08 DIAGNOSIS — E119 Type 2 diabetes mellitus without complications: Secondary | ICD-10-CM

## 2014-05-08 DIAGNOSIS — Z954 Presence of other heart-valve replacement: Secondary | ICD-10-CM

## 2014-05-08 DIAGNOSIS — R600 Localized edema: Secondary | ICD-10-CM

## 2014-05-08 DIAGNOSIS — I5033 Acute on chronic diastolic (congestive) heart failure: Secondary | ICD-10-CM

## 2014-05-08 DIAGNOSIS — R509 Fever, unspecified: Secondary | ICD-10-CM

## 2014-05-08 DIAGNOSIS — Z794 Long term (current) use of insulin: Secondary | ICD-10-CM

## 2014-05-08 DIAGNOSIS — M069 Rheumatoid arthritis, unspecified: Secondary | ICD-10-CM

## 2014-05-13 ENCOUNTER — Telehealth: Payer: Self-pay | Admitting: Cardiovascular Disease

## 2014-05-13 NOTE — Telephone Encounter (Signed)
I spoke with the [name]'s wife and the [name] saw his PCP last week after he developed a fever.  [name] had a fever Tuesday through Saturday with the highest being 101.8.  The [name] was started on Cipro Friday due to possible UTI.  They were just notified that [name] does have UTI and PCP stopped Cipro and started Bactrim (she is unsure of Rx details as this was just called into pharmacy). The PCP also advised the [name] to make our office aware that a stitch came out on Friday from his femoral incision.  The [name] did have yellow pus come from the site Friday and since then no further drainage.  They have applied neosporin and bandaid to area. The [name]'s wife feels like everything with the [name]'s incision is doing fine now.

## 2014-05-13 NOTE — Telephone Encounter (Signed)
Spoke with patients wife and recent fever and drainage from groin incision.  I offered to have Dr. Cornelius Moras see him tomorrow.   At this time, she just wants to keep an eye on this at home.  Says its minor and there hasn't been any more drainage.  She says she wants to keep appt with Dr. Excell Seltzer on 1/27 and if something changes she will call me to make an appt.  Dr. Cornelius Moras made aware.

## 2014-05-13 NOTE — Telephone Encounter (Signed)
New message    Wife calling his PCP wants the  MD to  Know patient has a small amount of infection around the incision. Wife feels like it's uncontrolled.   Upcoming appt on  1/27.

## 2014-05-29 ENCOUNTER — Other Ambulatory Visit (HOSPITAL_COMMUNITY): Payer: Self-pay | Admitting: Cardiovascular Disease

## 2014-05-29 ENCOUNTER — Encounter: Payer: Self-pay | Admitting: Cardiovascular Disease

## 2014-05-29 ENCOUNTER — Ambulatory Visit (INDEPENDENT_AMBULATORY_CARE_PROVIDER_SITE_OTHER): Payer: Medicare Other | Admitting: Cardiovascular Disease

## 2014-05-29 ENCOUNTER — Ambulatory Visit (HOSPITAL_COMMUNITY): Payer: Medicare Other

## 2014-05-29 ENCOUNTER — Other Ambulatory Visit (HOSPITAL_COMMUNITY): Payer: Self-pay

## 2014-05-29 ENCOUNTER — Ambulatory Visit (HOSPITAL_COMMUNITY): Payer: Medicare Other | Attending: Cardiology

## 2014-05-29 VITALS — BP 124/64 | HR 70 | Ht 71.0 in | Wt 258.0 lb

## 2014-05-29 DIAGNOSIS — Z954 Presence of other heart-valve replacement: Secondary | ICD-10-CM

## 2014-05-29 DIAGNOSIS — Z953 Presence of xenogenic heart valve: Secondary | ICD-10-CM

## 2014-05-29 DIAGNOSIS — Z952 Presence of prosthetic heart valve: Secondary | ICD-10-CM

## 2014-05-29 NOTE — Progress Notes (Signed)
Cardiology Office Note   Date:  05/29/2014   ID:  Teyon, Odette 1946/01/16, MRN [id number]  PCP:  Donzetta Sprung, MD  Cardiologist:  Tonny Bollman, MD    No chief complaint on file.    History of Present Illness: [name] is a 69 y.o. male who presents for follow-up evaluation. The patient has a complex medical history with chronic lung disease, atrial flutter, coronary artery disease status post CABG, cirrhosis with portal hypertension, and aortic stenosis. After extensive evaluation, he underwent transcatheter aortic valve replacement 04/03/2014 with a 29 mm Sapien 3 transcatheter heart valve. His postoperative course was prolonged because of volume overload. He otherwise had no complications. He presents today for his postoperative valve clinic visit.  The patient is doing very well. His edema has completely resolved. He notes improvement in his breathing. He's had no chest pain or pressure. He is primarily limited by chronic pains related to arthritis. He denies orthopnea, PND, or heart palpitations.  Past Medical History  Diagnosis Date  . Rheumatoid arthritis(714.0)     chronic Prednisone  . COPD (chronic obstructive pulmonary disease)   . Elevated liver function tests   . Hypertension   . Aortic stenosis     a. 2D ECHO: 01/30/2014: ef 50-55%. No RWMAs. Severe QI:HKVQQ area (VTI): 0.82 cm^2. Valve area (Vmax): 0.74 cm^2. Valve area (Vmean): 0.75 Cm^2. Mod LA/RA dilation. PA peak pressure 33  . CAD (coronary artery disease)   . GERD (gastroesophageal reflux disease)   . Chronic atrial flutter   . 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 Degenerative disc disease, cervical   . Degenerative disc disease, lumbar 2010  . Morbid obesity   . Coronary atherosclerosis of artery bypass graft 2010  . Acute on chronic diastolic heart failure 02/01/2014  . Chronic diastolic  congestive heart failure 02/01/2014  . Epistaxis     Developed on Xarelto therapy  . Cirrhosis 02/01/2014  . Portal hypertension 02/01/2014  . Chronic pancreatitis 02/01/2014    Noted on CT scan  . Shortness of breath   . Dizziness   . Heart murmur   . Asthma     uses nebulizers daily  . Diabetes mellitus     fasting blood glucose 120-130  . Pneumonia     hx of  . Dysrhythmia     fib/flutter  . Anemia     was on xalerto sept 2015 for afib - was taken off of - had to get unit of prbc  . History of blood transfusion   . 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 Coronary stent placement  01/02/2009    s/p bare metal stent to the vein graft to the RCA 9/20. nml LV function   . Appendectomy    . Coronary artery bypass graft  03/27/1997    CABG x 5 by Dr Cornelius Moras  . Anterior cervical decompression      with posterior decompression and fusion   . Tonsillectomy and adenoidectomy    . Lumbar spine surgery    . Esophagogastroduodenoscopy (egd) with propofol N/A 02/01/2014    Procedure: ESOPHAGOGASTRODUODENOSCOPY (EGD) WITH PROPOFOL;  Surgeon: Shirley Friar, MD;  Location: [facility]  ENDOSCOPY;  Service: Endoscopy;  Laterality: N/A;  . Colonoscopy with propofol N/A 02/01/2014    Procedure: COLONOSCOPY WITH PROPOFOL;  Surgeon: Shirley Friar, MD;  Location: [facility] ENDOSCOPY;  Service: Endoscopy;  Laterality: N/A;  . Multiple extractions with alveoloplasty N/A 03/12/2014    Procedure: MULTIPLE EXTRACTION OF TOOTH #'S 2,6,7,8,9,10,11,14,18,20,29,31 WITH ALVEOLOPLASTY AND GROSS DEBRIDMENT OF REMAINING TEETH;  Surgeon: Charlynne Pander, DDS;  Location: [facility] OR;  Service: Oral Surgery;  Laterality: N/A;  . Coronary angioplasty      1 stent  . Transcatheter aortic valve replacement, transfemoral N/A 04/03/2014    Procedure: TRANSCATHETER  AORTIC VALVE REPLACEMENT, TRANSFEMORAL;  Surgeon: Purcell Nails, MD;  Location: [facility] OR;  Service: Open Heart Surgery;  Laterality: N/A;  . Intraoperative transesophageal echocardiogram N/A 04/03/2014    Procedure: INTRAOPERATIVE TRANSESOPHAGEAL ECHOCARDIOGRAM;  Surgeon: Purcell Nails, MD;  Location: [facility] OR;  Service: Open Heart Surgery;  Laterality: N/A;  . Left and right heart catheterization with coronary/graft angiogram N/A 02/05/2014    Procedure: LEFT AND RIGHT HEART CATHETERIZATION WITH Isabel Caprice;  Surgeon: Peter M Swaziland, MD;  Location: [facility] CATH LAB;  Service: Cardiovascular;  Laterality: N/A;    Current Outpatient Prescriptions  Medication Sig Dispense Refill  . albuterol-ipratropium (COMBIVENT) 18-103 MCG/ACT inhaler Inhale 2 puffs into the lungs every 6 (six) hours as needed for wheezing or shortness of breath.     Marland Kitchen ascorbic acid (VITAMIN C) 500 MG tablet Take 500 mg by mouth daily.     Marland Kitchen aspirin 81 MG tablet Take 1 tablet (81 mg total) by mouth daily.    . bisacodyl (DULCOLAX) 5 MG EC tablet Take 2 tablets (10 mg total) by mouth daily as needed for moderate constipation (Give daily if no BM). 30 tablet 0  . budesonide (PULMICORT) 0.25 MG/2ML nebulizer solution Take 0.25 mg by nebulization 2 (two) times daily.    . cetirizine (ZYRTEC) 10 MG tablet Take 10 mg by mouth at bedtime.    . clopidogrel (PLAVIX) 75 MG tablet Take 1 tablet (75 mg total) by mouth daily. 90 tablet 3  . co-enzyme Q-10 30 MG capsule Take 30 mg by mouth at bedtime.     Marland Kitchen desonide (DESOWEN) 0.05 % cream Apply 1 application topically 2 (two) times daily as needed (for rash).     Marland Kitchen diltiazem (CARDIZEM) 60 MG tablet Take 1 tablet (60 mg total) by mouth 2 (two) times daily. 60 tablet 3  . ezetimibe (ZETIA) 10 MG tablet Take 10 mg by mouth at bedtime.     . formoterol (PERFOROMIST) 20 MCG/2ML nebulizer solution Take 2 mLs (20 mcg total) by nebulization 2 (two) times daily. Use in nebulizer twice daily  perfectly regularly 120 mL 11  . insulin NPH-insulin regular (NOVOLIN 70/30) (70-30) 100 UNIT/ML injection Inject 80 Units into the skin 2 (two) times daily.     Marland Kitchen ipratropium-albuterol (DUONEB) 0.5-2.5 (3) MG/3ML SOLN Take 3 mLs by nebulization every 6 (six) hours as needed (for shortness of breath).    Marland Kitchen ketoconazole (NIZORAL) 2 % cream Apply 1 application topically 2 (two) times daily as needed for irritation.     Marland Kitchen loratadine (CLARITIN) 10 MG tablet Take 10 mg by mouth every morning.     . metFORMIN (GLUCOPHAGE) 1000 MG tablet Take 1 tablet (1,000 mg total) by mouth 2 (two) times daily. Hold for 48 hours, restart on 02/10/2014.    . metoprolol succinate (TOPROL-XL) 25 MG 24 hr tablet Take 1 tablet (25 mg total)  by mouth 2 (two) times daily. 60 tablet 6  . Multiple Vitamin (MULTIVITAMIN) tablet Take 1 tablet by mouth daily.      Marland Kitchen oxyCODONE-acetaminophen (PERCOCET/ROXICET) 5-325 MG per tablet Take 1-2 tablets by mouth every 4 (four) hours as needed for moderate pain. 40 tablet 0  . polyethylene glycol (MIRALAX / GLYCOLAX) packet Take 17 g by mouth daily as needed for moderate constipation. 14 each 0  . potassium chloride SA (KLOR-CON M20) 20 MEQ tablet Take 1 tablet (20 mEq total) by mouth 2 (two) times daily. 90 tablet 3  . predniSONE (DELTASONE) 5 MG tablet Take 5 mg by mouth daily with breakfast.    . torsemide (DEMADEX) 20 MG tablet Take twice daily may take extra as needed for swelling 180 tablet 3   No current facility-administered medications for this visit.    Allergies:   Review of patient's allergies indicates no known allergies.   Social History:  The patient  reports that he quit smoking about 17 years ago. His smoking use included Cigarettes. He started smoking about 52 years ago. He has a 105 pack-year smoking history. His smokeless tobacco use includes Chew. He reports that he does not drink alcohol or use illicit drugs.   Family History:  The patient's  family history includes  Asthma in his child; Cancer in his father; Emphysema in his brother; Hypertension in his mother; Liver cancer in his paternal grandfather; Rheum arthritis in his maternal aunt; Stroke in his brother.    ROS:  Please see the history of present illness.  Otherwise, review of systems is positive for hearing loss, cough, back pain, easy bruising, leg pain, fatigue.  All other systems are reviewed and negative.    PHYSICAL EXAM: VS:  BP 124/64 mmHg  Pulse 70  Ht  (1.803 m)  Wt 258 lb (117.028 kg)  BMI 36.00 kg/m2 , BMI Body mass index is 36 kg/(m^2). GEN: Well nourished, well developed, in no acute distress HEENT: normal Neck: no JVD, carotid bruits, or masses Cardiac: Irregular; no murmurs, rubs, or gallops,no edema  Respiratory:  Diminished air movement bilaterally, no rales GI: soft, nontender, nondistended, + BS obese MS: no deformity or atrophy Skin: warm and dry, no rash Neuro:  Strength and sensation are intact Psych: euthymic mood, full affect  EKG:  EKG is not ordered today.  Recent Labs: 01/30/2014: TSH 3.730 04/01/2014: ALT 40 04/04/2014: Magnesium 1.7 04/06/2014: Hemoglobin 10.5*; Platelets 74* 04/08/2014: Pro B Natriuretic peptide (BNP) 237.7* 04/09/2014: BUN 15; Creatinine 0.82; Potassium 3.1*; Sodium 133*   Lipid Panel  No results found for: CHOL, TRIG, HDL, CHOLHDL, VLDL, LDLCALC, LDLDIRECT    Wt Readings from Last 3 Encounters:  05/29/14 258 lb (117.028 kg)  04/11/14 280 lb (127.007 kg)  04/09/14 280 lb 11.2 oz (127.325 kg)    ASSESSMENT AND PLAN: 1.  Aortic valve disorder status post TAVR. The patient's exam is suggestive of normal bioprosthetic valve function. I cannot appreciate a murmur on his physical exam. He will have a 2-D echocardiogram this afternoon and this will be reviewed as in his it is completed. He should continue on dual antiplatelet therapy with aspirin and Plavix for at least 6 months.  2. Coronary artery disease, native vessel. The patient  has no symptoms of angina at present. He is followed by Dr. Josue Hector.  3. Chronic diastolic heart failure, currently well compensated. The patient's weight is down 25 # from his hospitalization. The patient has New York Heart Association functional  class II symptoms without signs of volume overload on his exam.  Current medicines are reviewed with the patient today.  The patient does not have concerns regarding medicines.  The following changes have been made:  no change  Labs/ tests ordered today include: 2 D Echo  No orders of the defined types were placed in this encounter.    Disposition:   FU with Dr Purvis Sheffield as scheduled. FU with me for your 12 month post-TAVR visit with an echocardiogram.  Signed, Tonny Bollman, MD  05/29/2014 3:22 PM    [facility] [address] [place], [place], [place]  [address] Phone: [phone]; Fax: [phone]

## 2014-05-29 NOTE — Patient Instructions (Signed)
Your physician recommends that you continue on your current medications as directed. Please refer to the Current Medication list given to you today.  Your physician has requested that you have an echocardiogram in December. Echocardiography is a painless test that uses sound waves to create images of your heart. It provides your doctor with information about the size and shape of your heart and how well your heart's chambers and valves are working. This procedure takes approximately one hour. There are no restrictions for this procedure.  Your physician wants you to follow-up in: December with Dr Excell Seltzer. You will receive a reminder letter in the mail two months in advance. If you don't receive a letter, please call our office to schedule the follow-up appointment.

## 2014-05-29 NOTE — Progress Notes (Signed)
2D Echo completed. 05/29/2014 

## 2014-05-31 ENCOUNTER — Encounter: Payer: Self-pay | Admitting: Cardiovascular Disease

## 2014-05-31 ENCOUNTER — Ambulatory Visit (INDEPENDENT_AMBULATORY_CARE_PROVIDER_SITE_OTHER): Payer: Medicare Other | Admitting: Cardiovascular Disease

## 2014-05-31 VITALS — BP 112/82 | HR 47 | Ht 71.0 in | Wt 267.0 lb

## 2014-05-31 DIAGNOSIS — E119 Type 2 diabetes mellitus without complications: Secondary | ICD-10-CM

## 2014-05-31 DIAGNOSIS — I4892 Unspecified atrial flutter: Secondary | ICD-10-CM

## 2014-05-31 DIAGNOSIS — J438 Other emphysema: Secondary | ICD-10-CM

## 2014-05-31 DIAGNOSIS — I10 Essential (primary) hypertension: Secondary | ICD-10-CM

## 2014-05-31 DIAGNOSIS — Z954 Presence of other heart-valve replacement: Secondary | ICD-10-CM

## 2014-05-31 DIAGNOSIS — Z952 Presence of prosthetic heart valve: Secondary | ICD-10-CM

## 2014-05-31 DIAGNOSIS — Z794 Long term (current) use of insulin: Secondary | ICD-10-CM

## 2014-05-31 DIAGNOSIS — E785 Hyperlipidemia, unspecified: Secondary | ICD-10-CM

## 2014-05-31 DIAGNOSIS — I5032 Chronic diastolic (congestive) heart failure: Secondary | ICD-10-CM

## 2014-05-31 DIAGNOSIS — I25810 Atherosclerosis of coronary artery bypass graft(s) without angina pectoris: Secondary | ICD-10-CM

## 2014-05-31 NOTE — Patient Instructions (Signed)
Your physician wants you to follow-up in: 6 months at the [facility] office You will receive a reminder letter in the mail two months in advance. If you don't receive a letter, please call our office to schedule the follow-up appointment.    Your physician recommends that you continue on your current medications as directed. Please refer to the Current Medication list given to you today.     Thank you for choosing [facility] !

## 2014-05-31 NOTE — Progress Notes (Signed)
Patient ID: [name], male   DOB: 1945-12-15, 69 y.o.   MRN: [id number]      SUBJECTIVE: [name] is here with his wife and is doing much better. He does have a UTI and is being treated with Bactrim. He denies chest pain, palpitations, leg swelling, and shortness of breath. He saw Dr. Excell Seltzer this week and was noted to be doing very well after his aortic valve replacement.   Review of Systems: As per "subjective", otherwise negative.  No Known Allergies  Current Outpatient Prescriptions  Medication Sig Dispense Refill  . albuterol-ipratropium (COMBIVENT) 18-103 MCG/ACT inhaler Inhale 2 puffs into the lungs every 6 (six) hours as needed for wheezing or shortness of breath.     Marland Kitchen ascorbic acid (VITAMIN C) 500 MG tablet Take 500 mg by mouth daily.     Marland Kitchen aspirin 81 MG tablet Take 1 tablet (81 mg total) by mouth daily.    . bisacodyl (DULCOLAX) 5 MG EC tablet Take 2 tablets (10 mg total) by mouth daily as needed for moderate constipation (Give daily if no BM). 30 tablet 0  . budesonide (PULMICORT) 0.25 MG/2ML nebulizer solution Take 0.25 mg by nebulization 2 (two) times daily.    . cetirizine (ZYRTEC) 10 MG tablet Take 10 mg by mouth at bedtime.    . clopidogrel (PLAVIX) 75 MG tablet Take 1 tablet (75 mg total) by mouth daily. 90 tablet 3  . co-enzyme Q-10 30 MG capsule Take 30 mg by mouth at bedtime.     Marland Kitchen desonide (DESOWEN) 0.05 % cream Apply 1 application topically 2 (two) times daily as needed (for rash).     Marland Kitchen diltiazem (CARDIZEM) 60 MG tablet Take 1 tablet (60 mg total) by mouth 2 (two) times daily. 60 tablet 3  . ezetimibe (ZETIA) 10 MG tablet Take 10 mg by mouth at bedtime.     . formoterol (PERFOROMIST) 20 MCG/2ML nebulizer solution Take 2 mLs (20 mcg total) by nebulization 2 (two) times daily. Use in nebulizer twice daily perfectly regularly 120 mL 11  . insulin NPH-insulin regular (NOVOLIN 70/30) (70-30) 100 UNIT/ML injection Inject 80 Units into the skin 2 (two) times  daily.     Marland Kitchen ipratropium-albuterol (DUONEB) 0.5-2.5 (3) MG/3ML SOLN Take 3 mLs by nebulization every 6 (six) hours as needed (for shortness of breath).    Marland Kitchen ketoconazole (NIZORAL) 2 % cream Apply 1 application topically 2 (two) times daily as needed for irritation.     Marland Kitchen loratadine (CLARITIN) 10 MG tablet Take 10 mg by mouth every morning.     . metFORMIN (GLUCOPHAGE) 1000 MG tablet Take 1 tablet (1,000 mg total) by mouth 2 (two) times daily. Hold for 48 hours, restart on 02/10/2014.    . metoprolol succinate (TOPROL-XL) 25 MG 24 hr tablet Take 1 tablet (25 mg total) by mouth 2 (two) times daily. 60 tablet 6  . Multiple Vitamin (MULTIVITAMIN) tablet Take 1 tablet by mouth daily.      Marland Kitchen oxyCODONE-acetaminophen (PERCOCET/ROXICET) 5-325 MG per tablet Take 1-2 tablets by mouth every 4 (four) hours as needed for moderate pain. 40 tablet 0  . polyethylene glycol (MIRALAX / GLYCOLAX) packet Take 17 g by mouth daily as needed for moderate constipation. 14 each 0  . potassium chloride SA (KLOR-CON M20) 20 MEQ tablet Take 1 tablet (20 mEq total) by mouth 2 (two) times daily. 90 tablet 3  . predniSONE (DELTASONE) 5 MG tablet Take 5 mg by mouth daily with breakfast.    .  torsemide (DEMADEX) 20 MG tablet Take twice daily may take extra as needed for swelling 180 tablet 3   No current facility-administered medications for this visit.    Past Medical History  Diagnosis Date  . Rheumatoid arthritis(714.0)     chronic Prednisone  . COPD (chronic obstructive pulmonary disease)   . Elevated liver function tests   . Hypertension   . Aortic stenosis     a. 2D ECHO: 01/30/2014: ef 50-55%. No RWMAs. Severe YN:WGNFA area (VTI): 0.82 cm^2. Valve area (Vmax): 0.74 cm^2. Valve area (Vmean): 0.75 Cm^2. Mod LA/RA dilation. PA peak pressure 33  . CAD (coronary artery disease)   . GERD (gastroesophageal reflux disease)   . Chronic atrial flutter   . S/P CABG x 5 03/27/1997    LIMA to LAD, SVG to D1, SVG to OM1-OM2,  SVG to RCA with open vein harvest from right thigh and leg  . Degenerative disc disease, cervical   . Degenerative disc disease, lumbar 2010  . Morbid obesity   . Coronary atherosclerosis of artery bypass graft 2010  . Acute on chronic diastolic heart failure 02/01/2014  . Chronic diastolic congestive heart failure 02/01/2014  . Epistaxis     Developed on Xarelto therapy  . Cirrhosis 02/01/2014  . Portal hypertension 02/01/2014  . Chronic pancreatitis 02/01/2014    Noted on CT scan  . Shortness of breath   . Dizziness   . Heart murmur   . Asthma     uses nebulizers daily  . Diabetes mellitus     fasting blood glucose 120-130  . Pneumonia     hx of  . Dysrhythmia     fib/flutter  . Anemia     was on xalerto sept 2015 for afib - was taken off of - had to get unit of prbc  . History of blood transfusion   . 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 Coronary stent placement  01/02/2009    s/p bare metal stent to the vein graft to the RCA 9/20. nml LV function   . Appendectomy    . Coronary artery bypass graft  03/27/1997    CABG x 5 by Dr Cornelius Moras  . Anterior cervical decompression      with posterior decompression and fusion   . Tonsillectomy and adenoidectomy    . Lumbar spine surgery    . Esophagogastroduodenoscopy (egd) with propofol N/A 02/01/2014    Procedure: ESOPHAGOGASTRODUODENOSCOPY (EGD) WITH PROPOFOL;  Surgeon: Shirley Friar, MD;  Location: [facility] ENDOSCOPY;  Service: Endoscopy;  Laterality: N/A;  . Colonoscopy with propofol N/A 02/01/2014    Procedure: COLONOSCOPY WITH PROPOFOL;  Surgeon: Shirley Friar, MD;  Location: [facility] ENDOSCOPY;  Service: Endoscopy;  Laterality: N/A;  . Multiple extractions with alveoloplasty N/A 03/12/2014    Procedure: MULTIPLE EXTRACTION OF TOOTH #'S  2,6,7,8,9,10,11,14,18,20,29,31 WITH ALVEOLOPLASTY AND GROSS DEBRIDMENT OF REMAINING TEETH;  Surgeon: Charlynne Pander, DDS;  Location: [facility] OR;  Service: Oral Surgery;  Laterality: N/A;  . Coronary angioplasty      1 stent  . Transcatheter aortic valve replacement, transfemoral N/A 04/03/2014    Procedure: TRANSCATHETER AORTIC VALVE REPLACEMENT, TRANSFEMORAL;  Surgeon: Purcell Nails, MD;  Location: [facility] OR;  Service: Open Heart Surgery;  Laterality: N/A;  . Intraoperative transesophageal echocardiogram N/A 04/03/2014    Procedure: INTRAOPERATIVE TRANSESOPHAGEAL  ECHOCARDIOGRAM;  Surgeon: Purcell Nails, MD;  Location: [facility] OR;  Service: Open Heart Surgery;  Laterality: N/A;  . Left and right heart catheterization with coronary/graft angiogram N/A 02/05/2014    Procedure: LEFT AND RIGHT HEART CATHETERIZATION WITH Isabel Caprice;  Surgeon: Peter M Swaziland, MD;  Location: [facility] CATH LAB;  Service: Cardiovascular;  Laterality: N/A;    History   Social History  . Marital Status: Married    Spouse Name: N/A    Number of Children: N/A  . Years of Education: N/A   Occupational History  . Retired Special educational needs teacher    Social History Main Topics  . Smoking status: Former Smoker -- 3.00 packs/day for 35 years    Types: Cigarettes    Start date: 09/15/1961    Quit date: 11/23/1996  . Smokeless tobacco: Current User    Types: Chew     Comment: chews 2-3 packs per week  . Alcohol Use: No  . Drug Use: No  . Sexual Activity: Not on file   Other Topics Concern  . Not on file   Social History Narrative   Married, retired.      Filed Vitals:   05/31/14 1302  BP: 112/82  Pulse: 47  Height: 5\' 11"  (1.803 m)  Weight: 267 lb (121.11 kg)  SpO2: 97%   HR by auscultation: 96 bpm  PHYSICAL EXAM General: NAD HEENT: Normal. Neck: No JVD, no thyromegaly. Lungs: Clear to auscultation bilaterally with normal respiratory effort. CV: Irregular rhythm, normal S1/S2, no S3, no murmur. No pretibial or  periankle edema.   Abdomen: Soft, obese, no distention.  Neurologic: Alert and oriented x 3.  Psych: Normal affect. Skin: Normal. Musculoskeletal: No gross deformities. Extremities: No clubbing or cyanosis.   ECG: Most recent ECG reviewed.      ASSESSMENT AND PLAN: 1. CAD: No anginal discomfort. Has SVG stenosis, for which medical therapy was recommended. Will continue Plavix, ASA, Zetia, and metoprolol. 2. Essential HTN: Well controlled. No changes. 3. Hyperlipidemia: On Zetia. Followed by PCP. Intolerant to Lipitor.  4. Paroxysmal atrial flutter/atrial fibrillation: HR is reasonably controlled on diltiazem 60 mg bid. HR mildly elevated at rest and likely due to UTI. He is a poor candidate for anticoagulation given portal hypertension with esophageal varices. Will continue Plavix x 6 months at a minimum.  5. Severe aortic stenosis s/p TAVR: Echocardiogram on 05/29/14 showed a normally functioning prosthetic aortic valve with severe left atrial dilatation and normal LV systolic function. Dual antiplatelet therapy x 6 months at a minimum. 6. COPD: Followed by Dr. Sherene Sires. Appears to be stable. Uses oxygen at night. 7. Chronic diastolic heart failure: Stable and euvolemic. I will continue torsemide 20 mg bid with instructions to take an extra 20 mg for worsening leg swelling/weight gain.     He is taking supplemental KCl. 8. 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 On insulin.  Dispo: f/u 6 months.  Prentice Docker, M.D., F.A.C.C.

## 2014-08-14 ENCOUNTER — Other Ambulatory Visit: Payer: Self-pay | Admitting: Cardiovascular Disease

## 2014-08-20 ENCOUNTER — Other Ambulatory Visit: Payer: Self-pay | Admitting: Cardiovascular Disease

## 2014-08-20 MED ORDER — POTASSIUM CHLORIDE CRYS ER 20 MEQ PO TBCR: 20.0000 meq | EXTENDED_RELEASE_TABLET | Freq: Two times a day (BID) | ORAL | Status: DC

## 2014-08-29 ENCOUNTER — Telehealth: Payer: Self-pay | Admitting: Cardiovascular Disease

## 2014-08-29 NOTE — Telephone Encounter (Signed)
Please see refill bin / tg  °

## 2014-08-30 ENCOUNTER — Other Ambulatory Visit: Payer: Self-pay

## 2014-08-30 MED ORDER — DILTIAZEM HCL 60 MG PO TABS: 60.0000 mg | ORAL_TABLET | Freq: Two times a day (BID) | ORAL | Status: DC

## 2014-08-30 MED ORDER — TORSEMIDE 20 MG PO TABS: ORAL_TABLET | ORAL | Status: DC

## 2014-08-30 MED ORDER — METOPROLOL SUCCINATE ER 25 MG PO TB24: 25.0000 mg | ORAL_TABLET | Freq: Two times a day (BID) | ORAL | Status: DC

## 2014-08-30 NOTE — Telephone Encounter (Signed)
90 day supply as requested.

## 2014-08-30 NOTE — Telephone Encounter (Signed)
90 day supply refilled as requested

## 2014-10-15 NOTE — Patient Outreach (Signed)
[facility] Customer service manager [facility]) Care Management  10/15/2014  [name] 1946/02/12 [id number]   Referral from High Risk List with MD notes, assigned [name], RN.  [name]. [name] [facility] Care Management [facility] CM Assistant Phone: [phone] Fax: [phone]

## 2014-10-18 ENCOUNTER — Other Ambulatory Visit: Payer: Self-pay | Admitting: *Deleted

## 2014-10-18 ENCOUNTER — Encounter: Payer: Self-pay | Admitting: *Deleted

## 2014-10-18 NOTE — Patient Outreach (Signed)
10/18/14- Referral received from primary MD office (data analysis)- spoke with pt who reports he cannot hear well and he gives permission for RN CM to speak with his wife Clydie Braun, HIPAA verified, per Clydie Braun pt has all medications and can afford, taking as prescribed, wife transports pt to all appointments, pt normally weighs daily but recently has had ruptured disc and unable to stand up at present, on prednisone for this condition, pt has slight edema in feet, chronic dyspnea with exertion and on oxygen 24 hours per day, pt checks CBG "periodically" with Hgb AIC of 7.8, pt did fall in shower last week as wife was helping him and wife states " we're being more careful",  RN CM identified several needs with diabetes, CHF, safety and ask if RN CM could make home visit, wife refuses stating "just too much going on right now, we just don't need an extra person coming in"  Also refuses health coach or any other services (home health, etc).  Wife agreeable to have [facility] packet mailed to home with brochure and contact information, RN CM faxed letter to Dr. Reuel Boom informing pt/ wife refuses services although some needs are identified.  Mailed letter, contact information to pt home for future reference.  In Basket to [employer] informing of case closure.  [name] [facility], BSN [facility] Community Care Coordinator [phone]

## 2014-10-22 NOTE — Patient Outreach (Signed)
[facility]) Care Management  10/22/2014  COLESON KANT 1945-06-01 [id number]   Notification from [name], RN to close case due to patient refused services with [facility] Care Management.  [name]. [name] [facility] Care Management [facility] CM Assistant Phone: [phone] Fax: [phone]

## 2014-10-28 ENCOUNTER — Inpatient Hospital Stay (HOSPITAL_COMMUNITY)
Admission: EM | Admit: 2014-10-28 | Discharge: 2014-11-04 | DRG: 292 | Disposition: A | Discharge status: Home Health | Payer: Medicare Other | Attending: Internal Medicine | Admitting: Internal Medicine

## 2014-10-28 ENCOUNTER — Emergency Department (HOSPITAL_COMMUNITY): Payer: Medicare Other

## 2014-10-28 ENCOUNTER — Encounter (HOSPITAL_COMMUNITY): Payer: Self-pay | Admitting: *Deleted

## 2014-10-28 DIAGNOSIS — I4892 Unspecified atrial flutter: Secondary | ICD-10-CM | POA: Diagnosis present

## 2014-10-28 DIAGNOSIS — D696 Thrombocytopenia, unspecified: Secondary | ICD-10-CM | POA: Diagnosis present

## 2014-10-28 DIAGNOSIS — I350 Nonrheumatic aortic (valve) stenosis: Secondary | ICD-10-CM

## 2014-10-28 DIAGNOSIS — D649 Anemia, unspecified: Secondary | ICD-10-CM | POA: Diagnosis present

## 2014-10-28 DIAGNOSIS — W19XXXA Unspecified fall, initial encounter: Secondary | ICD-10-CM

## 2014-10-28 DIAGNOSIS — I509 Heart failure, unspecified: Secondary | ICD-10-CM

## 2014-10-28 DIAGNOSIS — Z951 Presence of aortocoronary bypass graft: Secondary | ICD-10-CM

## 2014-10-28 DIAGNOSIS — Z7952 Long term (current) use of systemic steroids: Secondary | ICD-10-CM

## 2014-10-28 DIAGNOSIS — M069 Rheumatoid arthritis, unspecified: Secondary | ICD-10-CM | POA: Diagnosis present

## 2014-10-28 DIAGNOSIS — Z9181 History of falling: Secondary | ICD-10-CM

## 2014-10-28 DIAGNOSIS — R42 Dizziness and giddiness: Secondary | ICD-10-CM

## 2014-10-28 DIAGNOSIS — I5033 Acute on chronic diastolic (congestive) heart failure: Secondary | ICD-10-CM | POA: Diagnosis not present

## 2014-10-28 DIAGNOSIS — J449 Chronic obstructive pulmonary disease, unspecified: Secondary | ICD-10-CM | POA: Diagnosis present

## 2014-10-28 DIAGNOSIS — Z9981 Dependence on supplemental oxygen: Secondary | ICD-10-CM

## 2014-10-28 DIAGNOSIS — Z823 Family history of stroke: Secondary | ICD-10-CM

## 2014-10-28 DIAGNOSIS — Z825 Family history of asthma and other chronic lower respiratory diseases: Secondary | ICD-10-CM

## 2014-10-28 DIAGNOSIS — R0602 Shortness of breath: Secondary | ICD-10-CM | POA: Diagnosis not present

## 2014-10-28 DIAGNOSIS — E876 Hypokalemia: Secondary | ICD-10-CM | POA: Diagnosis present

## 2014-10-28 DIAGNOSIS — J9610 Chronic respiratory failure, unspecified whether with hypoxia or hypercapnia: Secondary | ICD-10-CM | POA: Diagnosis present

## 2014-10-28 DIAGNOSIS — E872 Acidosis: Secondary | ICD-10-CM | POA: Diagnosis present

## 2014-10-28 DIAGNOSIS — Z7902 Long term (current) use of antithrombotics/antiplatelets: Secondary | ICD-10-CM

## 2014-10-28 DIAGNOSIS — E119 Type 2 diabetes mellitus without complications: Secondary | ICD-10-CM | POA: Diagnosis present

## 2014-10-28 DIAGNOSIS — Z87891 Personal history of nicotine dependence: Secondary | ICD-10-CM

## 2014-10-28 DIAGNOSIS — Z800 Family history of malignant neoplasm of digestive organs: Secondary | ICD-10-CM

## 2014-10-28 DIAGNOSIS — Z955 Presence of coronary angioplasty implant and graft: Secondary | ICD-10-CM

## 2014-10-28 DIAGNOSIS — Z952 Presence of prosthetic heart valve: Secondary | ICD-10-CM

## 2014-10-28 DIAGNOSIS — I10 Essential (primary) hypertension: Secondary | ICD-10-CM | POA: Diagnosis present

## 2014-10-28 DIAGNOSIS — Z8249 Family history of ischemic heart disease and other diseases of the circulatory system: Secondary | ICD-10-CM

## 2014-10-28 DIAGNOSIS — R270 Ataxia, unspecified: Secondary | ICD-10-CM

## 2014-10-28 DIAGNOSIS — Z7951 Long term (current) use of inhaled steroids: Secondary | ICD-10-CM

## 2014-10-28 DIAGNOSIS — K7460 Unspecified cirrhosis of liver: Secondary | ICD-10-CM | POA: Diagnosis present

## 2014-10-28 DIAGNOSIS — I4891 Unspecified atrial fibrillation: Secondary | ICD-10-CM | POA: Diagnosis present

## 2014-10-28 DIAGNOSIS — Z7982 Long term (current) use of aspirin: Secondary | ICD-10-CM

## 2014-10-28 DIAGNOSIS — Z6836 Body mass index (BMI) 36.0-36.9, adult: Secondary | ICD-10-CM

## 2014-10-28 LAB — COMPREHENSIVE METABOLIC PANEL
ALT: 48 U/L (ref 17–63)
AST: 73 U/L — ABNORMAL HIGH (ref 15–41)
Albumin: 2.7 g/dL — ABNORMAL LOW (ref 3.5–5.0)
Alkaline Phosphatase: 144 U/L — ABNORMAL HIGH (ref 38–126)
Anion gap: 7 (ref 5–15)
BUN: 16 mg/dL (ref 6–20)
CO2: 33 mmol/L — ABNORMAL HIGH (ref 22–32)
Calcium: 8.5 mg/dL — ABNORMAL LOW (ref 8.9–10.3)
Chloride: 96 mmol/L — ABNORMAL LOW (ref 101–111)
Creatinine, Ser: 0.94 mg/dL (ref 0.61–1.24)
GFR calc Af Amer: >60 mL/min (ref >60)
Glucose, Bld: 198 mg/dL — ABNORMAL HIGH (ref 65–99)
Potassium: 3.3 mmol/L — ABNORMAL LOW (ref 3.5–5.1)
SODIUM: 136 mmol/L (ref 135–145)
Total Bilirubin: 1.1 mg/dL (ref 0.3–1.2)
Total Protein: 6.6 g/dL (ref 6.5–8.1)

## 2014-10-28 LAB — URINE MICROSCOPIC-ADD ON

## 2014-10-28 LAB — URINALYSIS, ROUTINE W REFLEX MICROSCOPIC
Bilirubin Urine: NEGATIVE
GLUCOSE, UA: NEGATIVE mg/dL
Ketones, ur: NEGATIVE mg/dL
LEUKOCYTES UA: NEGATIVE
Nitrite: NEGATIVE
PH: 6 (ref 5.0–8.0)
Protein, ur: 100 mg/dL — AB
Specific Gravity, Urine: 1.02 (ref 1.005–1.030)
Urobilinogen, UA: 4 mg/dL — ABNORMAL HIGH (ref 0.0–1.0)

## 2014-10-28 LAB — CBC WITH DIFFERENTIAL/PLATELET
Basophils Absolute: 0.1 10*3/uL (ref 0.0–0.1)
Basophils Relative: 1 % (ref 0–1)
Eosinophils Absolute: 0.3 10*3/uL (ref 0.0–0.7)
Eosinophils Relative: 4 % (ref 0–5)
HCT: 41.5 % (ref 39.0–52.0)
HEMOGLOBIN: 13.3 g/dL (ref 13.0–17.0)
Lymphocytes Relative: 19 % (ref 12–46)
Lymphs Abs: 1.2 10*3/uL (ref 0.7–4.0)
MCH: 31.1 pg (ref 26.0–34.0)
MCHC: 32 g/dL (ref 30.0–36.0)
MCV: 97.2 fL (ref 78.0–100.0)
MONO ABS: 0.8 10*3/uL (ref 0.1–1.0)
Monocytes Relative: 12 % (ref 3–12)
Neutro Abs: 4 10*3/uL (ref 1.7–7.7)
Neutrophils Relative %: 64 % (ref 43–77)
Platelets: 88 10*3/uL — ABNORMAL LOW (ref 150–400)
RBC: 4.27 MIL/uL (ref 4.22–5.81)
RDW: 16 % — ABNORMAL HIGH (ref 11.5–15.5)
Smear Review: DECREASED
WBC: 6.3 10*3/uL (ref 4.0–10.5)

## 2014-10-28 LAB — BRAIN NATRIURETIC PEPTIDE: B NATRIURETIC PEPTIDE 5: 76 pg/mL (ref 0.0–100.0)

## 2014-10-28 LAB — TROPONIN I: Troponin I: <0.03 ng/mL (ref <0.031)

## 2014-10-28 LAB — LACTIC ACID, PLASMA: LACTIC ACID, VENOUS: 2.2 mmol/L — AB (ref 0.5–2.0)

## 2014-10-28 NOTE — ED Notes (Signed)
CRITICAL VALUE ALERT  Critical value received:  Lactic Acid 2.2  Date of notification:  10/28/14  Time of notification:  23:47  Critical value read back: yes  Nurse who received alert:  Tenna Delaine, RN  MD notified (1st page):   Time of first page:    MD notified (2nd page):  Time of second page:  Responding MD:    Time MD responded:

## 2014-10-28 NOTE — ED Notes (Signed)
Assisted out of car, into w/c, sob, and had a fall  Placed on 02, Is usually on 2L n/c

## 2014-10-28 NOTE — ED Notes (Signed)
Dr Wilkie Aye aware of lactic acid of 2.2, no additional orders given

## 2014-10-28 NOTE — ED Notes (Signed)
Dr Wilkie Aye at bedside,

## 2014-10-28 NOTE — ED Notes (Addendum)
Patient fell twice overnight last night.  Head hit bottom of chair. States he has been more SOB and weaker than normal recently.  Denies chest pain, dizziness, lightheadedness. States his mobility has declined over course of a week and has had falls more frequently. States swelling in legs has developed as well.  Wife states he has been coughing more.

## 2014-10-29 ENCOUNTER — Observation Stay (HOSPITAL_COMMUNITY): Payer: Medicare Other

## 2014-10-29 ENCOUNTER — Emergency Department (HOSPITAL_COMMUNITY): Payer: Medicare Other

## 2014-10-29 ENCOUNTER — Encounter (HOSPITAL_COMMUNITY): Payer: Self-pay | Admitting: Internal Medicine

## 2014-10-29 DIAGNOSIS — Z951 Presence of aortocoronary bypass graft: Secondary | ICD-10-CM

## 2014-10-29 DIAGNOSIS — R270 Ataxia, unspecified: Secondary | ICD-10-CM

## 2014-10-29 DIAGNOSIS — Z954 Presence of other heart-valve replacement: Secondary | ICD-10-CM

## 2014-10-29 DIAGNOSIS — I483 Typical atrial flutter: Secondary | ICD-10-CM

## 2014-10-29 LAB — TSH: TSH: 1.524 u[IU]/mL (ref 0.350–4.500)

## 2014-10-29 LAB — GLUCOSE, CAPILLARY
GLUCOSE-CAPILLARY: 134 mg/dL — AB (ref 65–99)
Glucose-Capillary: 158 mg/dL — ABNORMAL HIGH (ref 65–99)
Glucose-Capillary: 173 mg/dL — ABNORMAL HIGH (ref 65–99)
Glucose-Capillary: 179 mg/dL — ABNORMAL HIGH (ref 65–99)

## 2014-10-29 LAB — MRSA PCR SCREENING: MRSA by PCR: POSITIVE — AB

## 2014-10-29 LAB — LACTIC ACID, PLASMA: LACTIC ACID, VENOUS: 2.3 mmol/L — AB (ref 0.5–2.0)

## 2014-10-29 MED ORDER — ONE-DAILY MULTI VITAMINS PO TABS: 1.0000 | ORAL_TABLET | Freq: Every day | ORAL | Status: DC

## 2014-10-29 MED ORDER — COENZYME Q10 30 MG PO CAPS: 30.0000 mg | ORAL_CAPSULE | Freq: Every day | ORAL | Status: DC

## 2014-10-29 MED ORDER — CLOPIDOGREL BISULFATE 75 MG PO TABS
75.0000 mg | ORAL_TABLET | Freq: Every day | ORAL | Status: DC
  Administered 2014-10-29 – 2014-11-04 (×7): 75 mg via ORAL
  Filled 2014-10-29 (×7): qty 1

## 2014-10-29 MED ORDER — OXYCODONE-ACETAMINOPHEN 5-325 MG PO TABS
2.0000 | ORAL_TABLET | Freq: Once | ORAL | Status: AC
  Administered 2014-10-29: 2 via ORAL
  Filled 2014-10-29: qty 2

## 2014-10-29 MED ORDER — INSULIN ASPART 100 UNIT/ML ~~LOC~~ SOLN
0.0000 [IU] | SUBCUTANEOUS | Status: DC
  Administered 2014-10-29 (×3): 4 [IU] via SUBCUTANEOUS
  Administered 2014-10-29 – 2014-10-30 (×7): 3 [IU] via SUBCUTANEOUS
  Administered 2014-10-31: 4 [IU] via SUBCUTANEOUS
  Administered 2014-10-31: 3 [IU] via SUBCUTANEOUS
  Administered 2014-10-31 (×2): 4 [IU] via SUBCUTANEOUS
  Administered 2014-10-31: 3 [IU] via SUBCUTANEOUS
  Administered 2014-11-01 (×2): 4 [IU] via SUBCUTANEOUS
  Administered 2014-11-01 (×2): 3 [IU] via SUBCUTANEOUS

## 2014-10-29 MED ORDER — BISACODYL 5 MG PO TBEC
10.0000 mg | DELAYED_RELEASE_TABLET | Freq: Every day | ORAL | Status: DC | PRN
  Administered 2014-11-03 – 2014-11-04 (×2): 10 mg via ORAL
  Filled 2014-10-29 (×3): qty 2

## 2014-10-29 MED ORDER — IPRATROPIUM-ALBUTEROL 18-103 MCG/ACT IN AERO: 2.0000 | INHALATION_SPRAY | Freq: Four times a day (QID) | RESPIRATORY_TRACT | Status: DC | PRN

## 2014-10-29 MED ORDER — MUPIROCIN 2 % EX OINT
1.0000 "application " | TOPICAL_OINTMENT | Freq: Two times a day (BID) | CUTANEOUS | Status: AC
  Administered 2014-10-29 – 2014-11-03 (×10): 1 via NASAL
  Filled 2014-10-29 (×2): qty 22

## 2014-10-29 MED ORDER — ASPIRIN 81 MG PO CHEW
81.0000 mg | CHEWABLE_TABLET | Freq: Every day | ORAL | Status: DC
  Administered 2014-10-29 – 2014-11-04 (×7): 81 mg via ORAL
  Filled 2014-10-29 (×7): qty 1

## 2014-10-29 MED ORDER — METFORMIN HCL 500 MG PO TABS
1000.0000 mg | ORAL_TABLET | Freq: Two times a day (BID) | ORAL | Status: DC
Start: 2014-10-29 — End: 2014-11-04
  Administered 2014-10-29 – 2014-11-04 (×13): 1000 mg via ORAL
  Filled 2014-10-29 (×13): qty 2

## 2014-10-29 MED ORDER — CHLORHEXIDINE GLUCONATE CLOTH 2 % EX PADS
6.0000 | MEDICATED_PAD | Freq: Every day | CUTANEOUS | Status: AC
  Administered 2014-10-30 – 2014-11-03 (×5): 6 via TOPICAL

## 2014-10-29 MED ORDER — TORSEMIDE 20 MG PO TABS
10.0000 mg | ORAL_TABLET | Freq: Two times a day (BID) | ORAL | Status: DC
  Administered 2014-10-29 – 2014-10-30 (×3): 10 mg via ORAL
  Filled 2014-10-29 (×3): qty 1

## 2014-10-29 MED ORDER — METOPROLOL SUCCINATE ER 25 MG PO TB24
25.0000 mg | ORAL_TABLET | Freq: Two times a day (BID) | ORAL | Status: DC
  Administered 2014-10-29 – 2014-11-04 (×13): 25 mg via ORAL
  Filled 2014-10-29 (×13): qty 1

## 2014-10-29 MED ORDER — IPRATROPIUM-ALBUTEROL 0.5-2.5 (3) MG/3ML IN SOLN
3.0000 mL | Freq: Four times a day (QID) | RESPIRATORY_TRACT | Status: DC | PRN
  Administered 2014-10-29: 3 mL via RESPIRATORY_TRACT
  Filled 2014-10-29: qty 3

## 2014-10-29 MED ORDER — BUDESONIDE 0.25 MG/2ML IN SUSP
0.2500 mg | Freq: Two times a day (BID) | RESPIRATORY_TRACT | Status: DC
  Administered 2014-10-29 – 2014-11-04 (×11): 0.25 mg via RESPIRATORY_TRACT
  Filled 2014-10-29 (×18): qty 2

## 2014-10-29 MED ORDER — PREDNISONE 10 MG PO TABS
5.0000 mg | ORAL_TABLET | Freq: Every day | ORAL | Status: DC
  Administered 2014-10-29 – 2014-11-04 (×7): 5 mg via ORAL
  Filled 2014-10-29 (×7): qty 1

## 2014-10-29 MED ORDER — DILTIAZEM HCL 25 MG/5ML IV SOLN
10.0000 mg | Freq: Once | INTRAVENOUS | Status: AC
  Administered 2014-10-29: 10 mg via INTRAVENOUS
  Filled 2014-10-29: qty 5

## 2014-10-29 MED ORDER — SODIUM CHLORIDE 0.9 % IV BOLUS (SEPSIS)
500.0000 mL | Freq: Once | INTRAVENOUS | Status: AC
  Administered 2014-10-29: 500 mL via INTRAVENOUS

## 2014-10-29 MED ORDER — SODIUM CHLORIDE 0.9 % IJ SOLN
3.0000 mL | Freq: Two times a day (BID) | INTRAMUSCULAR | Status: DC
  Administered 2014-10-29 – 2014-11-04 (×11): 3 mL via INTRAVENOUS

## 2014-10-29 MED ORDER — POTASSIUM CHLORIDE CRYS ER 20 MEQ PO TBCR
40.0000 meq | EXTENDED_RELEASE_TABLET | Freq: Every day | ORAL | Status: DC
  Administered 2014-10-29 – 2014-11-03 (×6): 40 meq via ORAL
  Filled 2014-10-29 (×6): qty 2

## 2014-10-29 MED ORDER — DILTIAZEM HCL 60 MG PO TABS
60.0000 mg | ORAL_TABLET | Freq: Two times a day (BID) | ORAL | Status: DC
  Administered 2014-10-29 – 2014-10-31 (×5): 60 mg via ORAL
  Filled 2014-10-29 (×5): qty 1

## 2014-10-29 MED ORDER — POTASSIUM CHLORIDE IN NACL 20-0.9 MEQ/L-% IV SOLN
INTRAVENOUS | Status: DC
  Administered 2014-10-29 – 2014-10-30 (×2): via INTRAVENOUS

## 2014-10-29 MED ORDER — OXYCODONE-ACETAMINOPHEN 5-325 MG PO TABS
1.0000 | ORAL_TABLET | ORAL | Status: DC | PRN
  Administered 2014-10-30 – 2014-11-04 (×16): 1 via ORAL
  Filled 2014-10-29 (×17): qty 1

## 2014-10-29 MED ORDER — ADULT MULTIVITAMIN W/MINERALS CH
1.0000 | ORAL_TABLET | Freq: Every day | ORAL | Status: DC
  Administered 2014-10-29 – 2014-11-04 (×7): 1 via ORAL
  Filled 2014-10-29 (×7): qty 1

## 2014-10-29 MED ORDER — IPRATROPIUM-ALBUTEROL 0.5-2.5 (3) MG/3ML IN SOLN
3.0000 mL | Freq: Four times a day (QID) | RESPIRATORY_TRACT | Status: DC
  Administered 2014-10-29 – 2014-11-04 (×22): 3 mL via RESPIRATORY_TRACT
  Filled 2014-10-29 (×14): qty 3
  Filled 2014-10-29: qty 15
  Filled 2014-10-29 (×8): qty 3

## 2014-10-29 MED ORDER — HEPARIN SODIUM (PORCINE) 5000 UNIT/ML IJ SOLN
5000.0000 [IU] | Freq: Three times a day (TID) | INTRAMUSCULAR | Status: DC
  Administered 2014-10-29 – 2014-11-04 (×19): 5000 [IU] via SUBCUTANEOUS
  Filled 2014-10-29 (×18): qty 1

## 2014-10-29 MED ORDER — POLYETHYLENE GLYCOL 3350 17 G PO PACK: 17.0000 g | PACK | Freq: Every day | ORAL | Status: DC | PRN

## 2014-10-29 NOTE — ED Notes (Signed)
Pt also complaining of right hip pain from fall, states that the pain is why it is hard for him to stand,

## 2014-10-29 NOTE — ED Notes (Signed)
CRITICAL VALUE ALERT  Critical value received:  Lactic acid 2.3  Date of notification:  10/29/2014  Time of notification:  02:00  Critical value read back: yes  Nurse who received alert:  Dr Wilkie Aye  MD notified (1st page):  Dr Wilkie Aye  Time of first page:  02:00  MD notified (2nd page):  Time of second page:  Responding MD:  Dr Wilkie Aye  Time MD responded:  02:00

## 2014-10-29 NOTE — H&P (Addendum)
[facility] History and Physical  [name]:[id number] DOB: [birth year]    PCP:   Donzetta Sprung, MD   Chief Complaint: recurrent fallings.   HPI: [name] is an 69 y.o. 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  He had recently noted to have more trouble walking.  He felt imbalance.  He denied chest pain, SOB, fever or chills.  He had hx of CHF, and noted to have increased bilateral pedal edema.  Evaluation in the ER showed afib with RVR, and BP was borderline low.  He was given 500 cc IVF bolus, and subsequently given IV cardiazem 10mg .  His BP went up to 110 systolic, and his HR showed to 100.  His CT of the head was negative. His Cr was 0.94 and K was 3.3.  Hospitalist was asked to admit him for afib with RVR, CHF, hypotension and to exclude a posterior CVA.   Rewiew of Systems:  Constitutional: Negative for malaise, fever and chills. No significant weight loss or weight gain Eyes: Negative for eye pain, redness and discharge, diplopia, visual changes, or flashes of light. ENMT: Negative for ear pain, hoarseness, nasal congestion, sinus pressure and sore throat. No headaches; tinnitus, drooling, or problem swallowing. Cardiovascular: Negative for chest pain, palpitations, diaphoresis, . ; No orthopnea, PND Respiratory: Negative for cough, hemoptysis, wheezing and stridor. No pleuritic chestpain. Gastrointestinal: Negative for nausea, vomiting, diarrhea, constipation, abdominal pain, melena, blood in stool, hematemesis, jaundice and rectal bleeding.    Genitourinary: Negative for frequency, dysuria, incontinence,flank pain and hematuria; Musculoskeletal: Negative for back pain and neck pain. Negative for swelling and trauma.;   Skin: . Negative for pruritus, rash, abrasions, bruising and skin lesion.; ulcerations Neuro: Negative for headache, lightheadedness and neck stiffness. Negative for weakness, altered level of consciousness , altered mental status, extremity weakness, burning feet, involuntary movement, seizure and syncope.  Psych: negative for anxiety, depression, insomnia, tearfulness, panic attacks, hallucinations, paranoia, suicidal or homicidal ideation.   Past Medical History  Diagnosis Date  . Rheumatoid arthritis(714.0)     chronic Prednisone  . COPD (chronic obstructive pulmonary disease)   . Elevated liver function tests   . Hypertension   . Aortic stenosis     a. 2D ECHO: 01/30/2014: ef 50-55%. No RWMAs. Severe EF:EOFHQ area (VTI): 0.82 cm^2. Valve area (Vmax): 0.74 cm^2. Valve area (Vmean): 0.75 Cm^2. Mod LA/RA dilation. PA peak pressure 33  . CAD (coronary artery disease)   . GERD (gastroesophageal reflux disease)   . Chronic atrial flutter   . 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 Degenerative disc disease, cervical   . Degenerative disc disease, lumbar 2010  . Morbid obesity   . Coronary atherosclerosis of artery bypass graft 2010  . Acute on chronic diastolic heart failure 02/01/2014  . Chronic diastolic congestive heart failure 02/01/2014  . Epistaxis     Developed on Xarelto therapy  . Cirrhosis 02/01/2014  . Portal hypertension 02/01/2014  . Chronic pancreatitis 02/01/2014    Noted on CT scan  . Shortness of breath   . Dizziness   . Heart murmur   .  Asthma     uses nebulizers daily  . Diabetes mellitus     fasting blood glucose 120-130  . Pneumonia     hx of  . Dysrhythmia     fib/flutter  . Anemia     was on xalerto sept 2015 for afib - was taken off of - had to get unit of prbc  . History of blood transfusion   . 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 Coronary stent placement  01/02/2009    s/p bare metal stent to the vein graft to the RCA 9/20. nml LV function   . Appendectomy    . Coronary artery bypass graft  03/27/1997    CABG x 5 by Dr Cornelius Moras  . Anterior cervical decompression      with posterior decompression and fusion   . Tonsillectomy and adenoidectomy    . Lumbar spine surgery    . Esophagogastroduodenoscopy (egd) with propofol N/A 02/01/2014    Procedure: ESOPHAGOGASTRODUODENOSCOPY (EGD) WITH PROPOFOL;  Surgeon: Shirley Friar, MD;  Location: [facility] ENDOSCOPY;  Service: Endoscopy;  Laterality: N/A;  . Colonoscopy with propofol N/A 02/01/2014    Procedure: COLONOSCOPY WITH PROPOFOL;  Surgeon: Shirley Friar, MD;  Location: [facility] ENDOSCOPY;  Service: Endoscopy;  Laterality: N/A;  . Multiple extractions with alveoloplasty N/A 03/12/2014    Procedure: MULTIPLE EXTRACTION OF TOOTH #'S 2,6,7,8,9,10,11,14,18,20,29,31 WITH ALVEOLOPLASTY AND GROSS DEBRIDMENT OF REMAINING TEETH;  Surgeon: Charlynne Pander, DDS;  Location: [facility] OR;  Service: Oral Surgery;  Laterality: N/A;  . Coronary angioplasty      1 stent  . Transcatheter aortic valve replacement, transfemoral N/A 04/03/2014    Procedure: TRANSCATHETER AORTIC VALVE REPLACEMENT, TRANSFEMORAL;  Surgeon: Purcell Nails, MD;  Location: [facility] OR;  Service: Open Heart Surgery;  Laterality: N/A;  . Intraoperative transesophageal echocardiogram N/A 04/03/2014    Procedure: INTRAOPERATIVE TRANSESOPHAGEAL ECHOCARDIOGRAM;  Surgeon: Purcell Nails, MD;  Location: [facility] OR;  Service: Open Heart Surgery;  Laterality: N/A;  . Left and right heart catheterization with coronary/graft angiogram N/A 02/05/2014    Procedure: LEFT AND RIGHT HEART CATHETERIZATION WITH Isabel Caprice;  Surgeon: Unika Nazareno M [place], MD;  Location: [facility] CATH LAB;  Service: Cardiovascular;   Laterality: N/A;    Medications:  HOME MEDS: Prior to Admission medications   Medication Sig Start Date End Date Taking? Authorizing Provider  albuterol-ipratropium (COMBIVENT) 18-103 MCG/ACT inhaler Inhale 2 puffs into the lungs every 6 (six) hours as needed for wheezing or shortness of breath.     Historical Provider, MD  ascorbic acid (VITAMIN C) 500 MG tablet Take 500 mg by mouth daily.     Historical Provider, MD  aspirin 81 MG tablet Take 1 tablet (81 mg total) by mouth daily. 02/07/14   Rhonda G Barrett, PA-C  bisacodyl (DULCOLAX) 5 MG EC tablet Take 2 tablets (10 mg total) by mouth daily as needed for moderate constipation (Give daily if no BM). 04/09/14   Rhonda G Barrett, PA-C  budesonide (PULMICORT) 0.25 MG/2ML nebulizer solution Take 0.25 mg by nebulization 2 (two) times daily.    Historical Provider, MD  cetirizine (ZYRTEC) 10 MG tablet Take 10 mg by mouth at bedtime.    Historical Provider, MD  clopidogrel (PLAVIX) 75 MG tablet Take 1 tablet (75 mg total) by mouth daily. 04/11/14   Ottie Glazier  Purvis Sheffield, MD  co-enzyme Q-10 30 MG capsule Take 30 mg by mouth at bedtime.     Historical Provider, MD  desonide (DESOWEN) 0.05 % cream Apply 1 application topically 2 (two) times daily as needed (for rash).     Historical Provider, MD  diltiazem (CARDIZEM) 60 MG tablet Take 1 tablet (60 mg total) by mouth 2 (two) times daily. 08/30/14   Laqueta Linden, MD  ezetimibe (ZETIA) 10 MG tablet Take 10 mg by mouth at bedtime.     Historical Provider, MD  formoterol (PERFOROMIST) 20 MCG/2ML nebulizer solution Take 2 mLs (20 mcg total) by nebulization 2 (two) times daily. Use in nebulizer twice daily perfectly regularly 03/21/13   Nyoka Cowden, MD  insulin NPH-insulin regular (NOVOLIN 70/30) (70-30) 100 UNIT/ML injection Inject 80 Units into the skin 2 (two) times daily.     Historical Provider, MD  ipratropium-albuterol (DUONEB) 0.5-2.5 (3) MG/3ML SOLN Take 3 mLs by nebulization every 6 (six)  hours as needed (for shortness of breath).    Historical Provider, MD  ketoconazole (NIZORAL) 2 % cream Apply 1 application topically 2 (two) times daily as needed for irritation.     Historical Provider, MD  loratadine (CLARITIN) 10 MG tablet Take 10 mg by mouth every morning.     Historical Provider, MD  metFORMIN (GLUCOPHAGE) 1000 MG tablet Take 1 tablet (1,000 mg total) by mouth 2 (two) times daily. Hold for 48 hours, restart on 02/10/2014. 02/07/14   Joline Salt Barrett, PA-C  metoprolol succinate (TOPROL-XL) 25 MG 24 hr tablet Take 1 tablet (25 mg total) by mouth 2 (two) times daily. 08/30/14   Tonny Bollman, MD  Multiple Vitamin (MULTIVITAMIN) tablet Take 1 tablet by mouth daily.      Historical Provider, MD  oxyCODONE-acetaminophen (PERCOCET/ROXICET) 5-325 MG per tablet Take 1-2 tablets by mouth every 4 (four) hours as needed for moderate pain. 03/12/14   Charlynne Pander, DDS  polyethylene glycol (MIRALAX / Ethelene Hal) packet Take 17 g by mouth daily as needed for moderate constipation. 04/09/14   Rhonda G Barrett, PA-C  potassium chloride SA (KLOR-CON M20) 20 MEQ tablet Take 1 tablet (20 mEq total) by mouth 2 (two) times daily. 08/20/14   Laqueta Linden, MD  predniSONE (DELTASONE) 5 MG tablet Take 5 mg by mouth daily with breakfast.    Historical Provider, MD  torsemide (DEMADEX) 20 MG tablet Take twice daily may take extra as needed for swelling 08/30/14   Laqueta Linden, MD     Allergies:  No Known Allergies  Social History:   reports that he quit smoking about 17 years ago. His smoking use included Cigarettes. He started smoking about 53 years ago. He has a 105 pack-year smoking history. His smokeless tobacco use includes Chew. He reports that he does not drink alcohol or use illicit drugs.  Family History: Family History  Problem Relation Age of Onset  . Hypertension Mother   . Cancer Father   . Stroke Brother   . Liver cancer Paternal Grandfather   . Rheum arthritis  Maternal Aunt   . Asthma Child   . Emphysema Brother     smoked     Physical Exam: Filed Vitals:   10/29/14 0230 10/29/14 0240 10/29/14 0245 10/29/14 0325  BP: 100/69  113/84 126/77  Pulse: 114  117 98  Temp:  98.1 F (36.7 C)    TempSrc:      Resp: 23  22 24   Height:  Weight:      SpO2: 98%  100% 96%   Blood pressure 126/77, pulse 98, temperature 98.1 F (36.7 C), temperature source Oral, resp. rate 24, height 5\' 11"  (1.803 m), weight 117.935 kg (260 lb), SpO2 96 %.  GEN:  Pleasant patient lying in the stretcher in no acute distress; cooperative with exam. PSYCH:  alert and oriented x4; does not appear anxious or depressed; affect is appropriate. HEENT: Mucous membranes pink and anicteric; PERRLA; EOM intact; no cervical lymphadenopathy nor thyromegaly or carotid bruit; no JVD; There were no stridor. Neck is very supple. Breasts:: Not examined CHEST WALL: No tenderness CHEST: Normal respiration, clear to auscultation bilaterally.  HEART: Irregular rate and rhythm.  There are no murmur, rub, or gallops.   BACK: No kyphosis or scoliosis; no CVA tenderness ABDOMEN: soft and non-tender; no masses, no organomegaly, normal abdominal bowel sounds; no pannus; no intertriginous candida. There is no rebound and no distention. Rectal Exam: Not done EXTREMITIES: No bone or joint deformity; age-appropriate arthropathy of the hands and knees; 2+ edema, no ulcerations.  There is no calf tenderness. Genitalia: not examined PULSES: 2+ and symmetric SKIN: Normal hydration no rash or ulceration CNS: Cranial nerves 2-12 grossly intact no focal lateralizing neurologic deficit.  Speech is fluent; uvula elevated with phonation, facial symmetry and tongue midline. DTR are normal bilaterally, cerebella exam is intact, barbinski is negative and strengths are equaled bilaterally.  No sensory loss.   Labs on Admission:  Basic Metabolic Panel:  Recent Labs Lab 10/28/14 2250  NA 136  K 3.3*  CL  96*  CO2 33*  GLUCOSE 198*  BUN 16  CREATININE 0.94  CALCIUM 8.5*   Liver Function Tests:  Recent Labs Lab 10/28/14 2250  AST 73*  ALT 48  ALKPHOS 144*  BILITOT 1.1  PROT 6.6  ALBUMIN 2.7*   CBC:  Recent Labs Lab 10/28/14 2250  WBC 6.3  NEUTROABS 4.0  HGB 13.3  HCT 41.5  MCV 97.2  PLT 88*   Cardiac Enzymes:  Recent Labs Lab 10/28/14 2250  TROPONINI <0.03    CBG: No results for input(s): GLUCAP in the last 168 hours.   Radiological Exams on Admission: Dg Chest 2 View  10/29/2014   CLINICAL DATA:  Dyspnea and weakness, recent worsening. Fell twice last night.  EXAM: CHEST  2 VIEW  COMPARISON:  04/10/2014  FINDINGS: There is prior sternotomy with CABG and aortic valvuloplasty. Heart size is normal. The lungs are clear except for mild curvilinear basilar scarring. There is no pleural effusion. The pulmonary vasculature is normal. Hilar and mediastinal contours are unremarkable.  IMPRESSION: No acute cardiopulmonary findings.   Electronically Signed   By: Ellery Plunk M.D.   On: 10/29/2014 00:14   Ct Head Wo Contrast  10/29/2014   CLINICAL DATA:  Frequent falls, fell twice last night. Hit head on chair. Weakness.  EXAM: CT HEAD WITHOUT CONTRAST  TECHNIQUE: Contiguous axial images were obtained from the base of the skull through the vertex without intravenous contrast.  COMPARISON:  None.  FINDINGS: No intracranial hemorrhage, mass effect, or midline shift. No hydrocephalus. The basilar cisterns are patent. No evidence of territorial infarct. No intracranial fluid collection. Calvarium is intact. Included paranasal sinuses and mastoid air cells are well aerated.  IMPRESSION: No acute intracranial abnormality.   Electronically Signed   By: Rubye Oaks M.D.   On: 10/29/2014 00:46   Dg Hips Bilat With Pelvis Min 5 Views  10/29/2014   CLINICAL DATA:  Bilateral hip pain.  Fell twice last night.  EXAM: BILATERAL HIP (WITH PELVIS) 5-6 VIEWS  COMPARISON:  None.   FINDINGS: Negative for acute fracture or dislocation about either hip. There are calcifications adjacent to the left greater trochanter which may represent sequelae from trochanteric bursitis. There is no bone lesion or bony destruction. No significant arthritic changes are evident.  IMPRESSION: Negative for acute fracture or dislocation.   Electronically Signed   By: Ellery Plunk M.D.   On: 10/29/2014 01:17    Assessment/Plan Present on Admission:  . Atrial flutter . Chronic diastolic congestive heart failure . Morbid obesity Ataxia Hypokalemia CHF  PLAN:  For his ataxia, I am not convinced this is a posterior CVA.  His Edward Sapien valve is compatible with certain MRI. I have ordered an MRI of the brain without contrast.  Will continue his DAPT.  For his afib with RVR, will continue with CCB and BB orally.  He doesn't need an IV diltiazem drip.  His fluid status is difficult to manage, as he has peripheral edema, but likely inttravascularly depleted, with hypotension.  I will hold his lasix and diuretic at this time.  For his hypokalemia, will give daily K supplement.  He is stable, full code, and will be admitted to telemetry under [facility].  Thank you and good day.   Other plans as per orders.  Code Status: FULL Unk Lightning, MD. [facility] Pager [phone] 7pm to 7am.  10/29/2014, 3:27 AM

## 2014-10-29 NOTE — ED Notes (Signed)
Pt transported to xray with RN,

## 2014-10-29 NOTE — ED Notes (Signed)
Pt states that he started having falls about a week and half ago,

## 2014-10-29 NOTE — Care Management Note (Signed)
Case Management Note  Patient Details  Name: [name] MRN: [id number] Date of Birth: May 10, 1945  Subjective/Objective:                  Pt admitted from home with ataxia. Pt lives with his wife and will return home at discharge. Pt has a w/c, walker, BSC for home use. Pt requires some assistance with ADL's. Pt also has home O2 with [employer] and neb machine.  Action/Plan: PT is recommending HH PT. HH PT arranged with [facility] per wife choice. Alroy Bailiff of [facility] is aware and will collect the pts information from the chart. HH services to start within 48 hours of discharge. No new DME needs noted. Pt and pts nurse aware of discharge arrangements. Pts wife signed the Medicare OBS notification form and form placed on pts chart.  Expected Discharge Date:    10/30/14              Expected Discharge Plan:  Home w Home Health Services  In-House Referral:  NA  Discharge planning Services  CM Consult  Post Acute Care Choice:  Home Health Choice offered to:  Spouse  DME Arranged:    DME Agency:     HH Arranged:  PT HH Agency:  [facility]  Status of Service:  Completed, signed off  Medicare Important Message Given:    Date Medicare IM Given:    Medicare IM give by:    Date Additional Medicare IM Given:    Additional Medicare Important Message give by:     If discussed at Long Length of Stay Meetings, dates discussed:    Additional Comments:  Cheryl Flash, RN 10/29/2014, 11:13 AM

## 2014-10-29 NOTE — Progress Notes (Signed)
Pt requested peanut butter and graham crackers. Pt educated on diet, Q4hr accuchecks, and insulin requirements. Pt insisted on peanut butter and graham crackers for snack. Will continue to educate and monitor pt.

## 2014-10-29 NOTE — Evaluation (Signed)
Physical Therapy Evaluation Patient Details Name: [name] MRN: [id number] DOB: 27-Jan-1946 Today's Date: 10/29/2014   History of Present Illness  [name] is an 69 y.o. 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 He had recently noted to have more trouble walking. He felt imbalance. He denied chest pain, SOB, fever or chills. He had hx of CHF, and noted to have increased bilateral pedal edema. Evaluation in the ER showed afib with RVR, and BP was borderline low. He was given 500 cc IVF bolus, and subsequently given IV cardiazem 10mg . His BP went up to 110 systolic, and his HR showed to 100. His CT of the head was negative. His Cr was 0.94 and K was 3.3. Hospitalist was asked to admit him for afib with RVR, CHF, hypotension and to exclude a posterior CVA.   Clinical Impression   Pt was seen for evaluation.  He is very HOH but pleasant and cooperative.  He is generally very sedentary at home due to COPD (O2 dependent on 2.5 L O2), cardiac disease, RA and DJD of the spine. He developed a sudden inability to walk about 2 weeks ago for no particular reason.  He has had multiple falls while trying to ambulate with his cane.  Evaluation reveals specific weakness in the left femoral nerve distribution.  He has significant difficulty rising from standing(especially from a low position) and walking is somewhat unstable with a standard walker due to left quad weakness.  We will recommend HHPT at d/c, DME is adequate.      Follow Up Recommendations Home health PT    Equipment Recommendations  None recommended by PT    Recommendations for Other Services   none    Precautions / Restrictions Precautions Precautions: Fall Precaution Comments:  orange contact Restrictions Weight Bearing Restrictions: No      Mobility  Bed Mobility Overal bed mobility: Needs Assistance Bed Mobility: Supine to Sit     Supine to sit: Min guard     General bed mobility comments: transfer is extremely labored but pt is able to achieve sitting from supine  Transfers Overall transfer level: Needs assistance Equipment used: Standard walker Transfers: Sit to/from Stand Sit to Stand: Supervision         General transfer comment: pt needed max assist of therapist to stand from a commode...he needs to always sit in an elevated chair height  Ambulation/Gait Ambulation/Gait assistance: Min guard Ambulation Distance (Feet): 10 Feet (x 2 laps) Assistive device: Standard walker Gait Pattern/deviations: Decreased step length - left;Decreased step length - right;Decreased stance time - left Gait velocity: appropriate for the situation Gait velocity interpretation: Below normal speed for age/gender                  Balance Overall balance assessment: Needs assistance Sitting-balance support: No upper extremity supported;Feet supported Sitting balance-Leahy Scale: Good     Standing balance support: Bilateral upper extremity supported Standing balance-Leahy Scale: Fair                               Pertinent Vitals/Pain Pain Assessment: No/denies pain    Home Living Family/patient expects to be discharged to:: Private residence Living  Arrangements: Spouse/significant other Available Help at Discharge: Family;Available 24 hours/day Type of Home: House Home Access: Level entry     Home Layout: One level Home Equipment: Walker - standard;Cane - single point;Bedside commode;Shower seat;Wheelchair - manual      Prior Function Level of Independence: Independent with assistive device(s)         Comments: ambulated with either a cane or no assistive device but fairly sedentary             Extremity/Trunk  Assessment               Lower Extremity Assessment: RLE deficits/detail;LLE deficits/detail RLE Deficits / Details: strength generally 4-/5 in all musculature LLE Deficits / Details: significant weakness in the femoral nerve distribution to include the iliopsoas, quadriceps and sartorius (2/5)...pt reports that since his back surgery this has been his weaker leg but has never been this weak  Cervical / Trunk Assessment: Kyphotic  Communication   Communication: HOH  Cognition Arousal/Alertness: Awake/alert Behavior During Therapy: WFL for tasks assessed/performed Overall Cognitive Status: Within Functional Limits for tasks assessed                                    Assessment/Plan    PT Assessment Patient needs continued PT services  PT Diagnosis Difficulty walking;Abnormality of gait;Generalized weakness   PT Problem List Decreased strength;Decreased activity tolerance;Decreased mobility;Decreased knowledge of use of DME;Cardiopulmonary status limiting activity;Obesity  PT Treatment Interventions DME instruction;Gait training;Functional mobility training;Therapeutic exercise   PT Goals (Current goals can be found in the Care Plan section) Acute Rehab PT Goals Patient Stated Goal: wants to regain strength and ability to walk at home PT Goal Formulation: With patient/family Time For Goal Achievement: 11/12/14 Potential to Achieve Goals: Fair    Frequency Min 3X/week   Barriers to discharge   no barriers but wife is unable to physically assist pt due to recently having a toe amputation and also has injured her back                   End of Session Equipment Utilized During Treatment: Gait belt;Oxygen Activity Tolerance: Patient limited by fatigue Patient left: in bed;with call bell/phone within reach;with bed alarm set Nurse Communication: Mobility status    Functional Assessment Tool Used: clinical judgement Functional Limitation: Mobility:  Walking and moving around Mobility: Walking and Moving Around Current Status (G8916): At least 40 percent but less than 60 percent impaired, limited or restricted Mobility: Walking and Moving Around Goal Status [phone]): At least 20 percent but less than 40 percent impaired, limited or restricted    Time: 8882-8003 PT Time Calculation (min) (ACUTE ONLY): 49 min   Charges:   PT Evaluation $Initial PT Evaluation Tier I: 1 Procedure     PT G Codes:   PT G-Codes **NOT FOR INPATIENT CLASS** Functional Assessment Tool Used: clinical judgement Functional Limitation: Mobility: Walking and moving around Mobility: Walking and Moving Around Current Status (K9179): At least 40 percent but less than 60 percent impaired, limited or restricted Mobility: Walking and Moving Around Goal Status [phone]): At least 20 percent but less than 40 percent impaired, limited or restricted    Konrad Penta  PT 10/29/2014, 11:16 AM [phone]

## 2014-10-29 NOTE — Progress Notes (Signed)
Patient is a 69 year old man with a history of aortic valve replacement on Plavix and aspirin, A. fib/A flutter, CAD with CABG, chronic steroid-dependent COPD, and obesity. He was admitted this morning by Dr. Conley Rolls for recurrent falls.  Patient was briefly seen and examined. His chart, vital signs, laboratory studies were reviewed.  Agree with initial assessment plan, with additions below.   -Radiology called and said they did not feel comfortable obtaining the MRI of his brain secondary to the coiled apparatus seen on the chest x-ray. They stated that the valve was okay, but the other material seen could not be confirmed as safe, therefore, the MRI was canceled.  -Patient is alert and oriented and does not appear to have any cranial nerve deficits, but posterior circulation optimize cannot be ruled out. Would continue to treat patient medically with aspirin and Plavix. -PT consulted and recommended home health PT. -Have added gentle IV fluids and restarted torsemide at a lower dose. -Continue to supplement potassium chloride. -Patient's TSH is within normal limits. Hemoglobin A1c pending. -We'll order vitamin B12 to rule out deficiency.

## 2014-10-29 NOTE — ED Notes (Signed)
Pt has butrans patch on right shoulder that was placed on Sunday evening, 10-27-2014

## 2014-10-29 NOTE — ED Provider Notes (Signed)
CSN: [id number]     Arrival date & time 10/28/14  2150 History   First MD Initiated Contact with Patient 10/28/14 2338     Chief Complaint  Patient presents with  . Fall  . Shortness of Breath     (Consider location/radiation/quality/duration/timing/severity/associated sxs/prior Treatment) HPI  This is a 69 year old male with a history of COPD on chronic home oxygen, hypertension, aortic stenosis, coronary artery disease, diastolic heart failure who presents with multiple falls and a feeling of "being off balance."  Patient reports that approximately 10 days ago he fell. He states that he's been having "balance issues" for the last 2 weeks. Denies any room spinning dizziness. He states he just feels off balance. He states that he fell twice last night and felt like he had weakness at that time on the left side. Secondary to fall he reports right hip pain. He also reports shortness of breath and lower extremity edema. Denies any chest pain. Denies hitting his head or loss of consciousness. He is at baseline on 2 half liters of oxygen at home. He takes diltiazem for atrial fibrillation but has not taken any of his medications in the last day.  Past Medical History  Diagnosis Date  . Rheumatoid arthritis(714.0)     chronic Prednisone  . COPD (chronic obstructive pulmonary disease)   . Elevated liver function tests   . Hypertension   . Aortic stenosis     a. 2D ECHO: 01/30/2014: ef 50-55%. No RWMAs. Severe MA:UQJFH area (VTI): 0.82 cm^2. Valve area (Vmax): 0.74 cm^2. Valve area (Vmean): 0.75 Cm^2. Mod LA/RA dilation. PA peak pressure 33  . CAD (coronary artery disease)   . GERD (gastroesophageal reflux disease)   . Chronic atrial flutter   . 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 Degenerative disc disease, cervical   . Degenerative disc disease, lumbar 2010  . Morbid obesity   . Coronary  atherosclerosis of artery bypass graft 2010  . Acute on chronic diastolic heart failure 02/01/2014  . Chronic diastolic congestive heart failure 02/01/2014  . Epistaxis     Developed on Xarelto therapy  . Cirrhosis 02/01/2014  . Portal hypertension 02/01/2014  . Chronic pancreatitis 02/01/2014    Noted on CT scan  . Shortness of breath   . Dizziness   . Heart murmur   . Asthma     uses nebulizers daily  . Diabetes mellitus     fasting blood glucose 120-130  . Pneumonia     hx of  . Dysrhythmia     fib/flutter  . Anemia     was on xalerto sept 2015 for afib - was taken off of - had to get unit of prbc  . History of blood transfusion   . 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 Coronary stent placement  01/02/2009    s/p bare metal stent to the vein graft to the RCA 9/20. nml LV function   . Appendectomy    . Coronary artery bypass graft  03/27/1997    CABG x 5 by Dr Cornelius Moras  . Anterior cervical decompression      with posterior decompression and fusion   . Tonsillectomy and adenoidectomy    .  Lumbar spine surgery    . Esophagogastroduodenoscopy (egd) with propofol N/A 02/01/2014    Procedure: ESOPHAGOGASTRODUODENOSCOPY (EGD) WITH PROPOFOL;  Surgeon: Shirley Friar, MD;  Location: [facility] ENDOSCOPY;  Service: Endoscopy;  Laterality: N/A;  . Colonoscopy with propofol N/A 02/01/2014    Procedure: COLONOSCOPY WITH PROPOFOL;  Surgeon: Shirley Friar, MD;  Location: [facility] ENDOSCOPY;  Service: Endoscopy;  Laterality: N/A;  . Multiple extractions with alveoloplasty N/A 03/12/2014    Procedure: MULTIPLE EXTRACTION OF TOOTH #'S 2,6,7,8,9,10,11,14,18,20,29,31 WITH ALVEOLOPLASTY AND GROSS DEBRIDMENT OF REMAINING TEETH;  Surgeon: Charlynne Pander, DDS;  Location: [facility] OR;  Service: Oral Surgery;  Laterality: N/A;  . Coronary  angioplasty      1 stent  . Transcatheter aortic valve replacement, transfemoral N/A 04/03/2014    Procedure: TRANSCATHETER AORTIC VALVE REPLACEMENT, TRANSFEMORAL;  Surgeon: Purcell Nails, MD;  Location: [facility] OR;  Service: Open Heart Surgery;  Laterality: N/A;  . Intraoperative transesophageal echocardiogram N/A 04/03/2014    Procedure: INTRAOPERATIVE TRANSESOPHAGEAL ECHOCARDIOGRAM;  Surgeon: Purcell Nails, MD;  Location[facility] OR;  Service: Open Heart Surgery;  Laterality: N/A;  . Left and right heart catheterization with coronary/graft angiogram N/A 02/05/2014    Procedure: LEFT AND RIGHT HEART CATHETERIZATION WITH Isabel Caprice;  Surgeon: Peter M Swaziland, MD;  Location: [facility] CATH LAB;  Service: Cardiovascular;  Laterality: N/A;   Family History  Problem Relation Age of Onset  . Hypertension Mother   . Cancer Father   . Stroke Brother   . Liver cancer Paternal Grandfather   . Rheum arthritis Maternal Aunt   . Asthma Child   . Emphysema Brother     smoked   History  Substance Use Topics  . Smoking status: Former Smoker -- 3.00 packs/day for 35 years    Types: Cigarettes    Start date: 09/15/1961    Quit date: 11/23/1996  . Smokeless tobacco: Current User    Types: Chew     Comment: chews 2-3 packs per week  . Alcohol Use: No    Review of Systems  Constitutional: Negative for fever.  Respiratory: Positive for shortness of breath. Negative for cough and chest tightness.   Cardiovascular: Positive for leg swelling. Negative for chest pain.  Gastrointestinal: Negative.  Negative for nausea, vomiting and abdominal pain.  Genitourinary: Negative.  Negative for dysuria.  Musculoskeletal: Positive for back pain.       Hip pain  Skin: Negative for rash.  Neurological: Positive for weakness. Negative for headaches.       Feeling off balance  All other systems reviewed and are negative.     Allergies  Review of patient's allergies indicates no known allergies.  Home  Medications   Prior to Admission medications   Medication Sig Start Date End Date Taking? Authorizing Provider  albuterol-ipratropium (COMBIVENT) 18-103 MCG/ACT inhaler Inhale 2 puffs into the lungs every 6 (six) hours as needed for wheezing or shortness of breath.     Historical Provider, MD  ascorbic acid (VITAMIN C) 500 MG tablet Take 500 mg by mouth daily.     Historical Provider, MD  aspirin 81 MG tablet Take 1 tablet (81 mg total) by mouth daily. 02/07/14   Rhonda G Barrett, PA-C  bisacodyl (DULCOLAX) 5 MG EC tablet Take 2 tablets (10 mg total) by mouth daily as needed for moderate constipation (Give daily if no BM). 04/09/14   Rhonda G Barrett, PA-C  budesonide (PULMICORT) 0.25 MG/2ML nebulizer solution Take 0.25 mg by nebulization 2 (two) times daily.  Historical Provider, MD  cetirizine (ZYRTEC) 10 MG tablet Take 10 mg by mouth at bedtime.    Historical Provider, MD  clopidogrel (PLAVIX) 75 MG tablet Take 1 tablet (75 mg total) by mouth daily. 04/11/14   Laqueta Linden, MD  co-enzyme Q-10 30 MG capsule Take 30 mg by mouth at bedtime.     Historical Provider, MD  desonide (DESOWEN) 0.05 % cream Apply 1 application topically 2 (two) times daily as needed (for rash).     Historical Provider, MD  diltiazem (CARDIZEM) 60 MG tablet Take 1 tablet (60 mg total) by mouth 2 (two) times daily. 08/30/14   Laqueta Linden, MD  ezetimibe (ZETIA) 10 MG tablet Take 10 mg by mouth at bedtime.     Historical Provider, MD  formoterol (PERFOROMIST) 20 MCG/2ML nebulizer solution Take 2 mLs (20 mcg total) by nebulization 2 (two) times daily. Use in nebulizer twice daily perfectly regularly 03/21/13   Nyoka Cowden, MD  insulin NPH-insulin regular (NOVOLIN 70/30) (70-30) 100 UNIT/ML injection Inject 80 Units into the skin 2 (two) times daily.     Historical Provider, MD  ipratropium-albuterol (DUONEB) 0.5-2.5 (3) MG/3ML SOLN Take 3 mLs by nebulization every 6 (six) hours as needed (for shortness of  breath).    Historical Provider, MD  ketoconazole (NIZORAL) 2 % cream Apply 1 application topically 2 (two) times daily as needed for irritation.     Historical Provider, MD  loratadine (CLARITIN) 10 MG tablet Take 10 mg by mouth every morning.     Historical Provider, MD  metFORMIN (GLUCOPHAGE) 1000 MG tablet Take 1 tablet (1,000 mg total) by mouth 2 (two) times daily. Hold for 48 hours, restart on 02/10/2014. 02/07/14   Joline Salt Barrett, PA-C  metoprolol succinate (TOPROL-XL) 25 MG 24 hr tablet Take 1 tablet (25 mg total) by mouth 2 (two) times daily. 08/30/14   Tonny Bollman, MD  Multiple Vitamin (MULTIVITAMIN) tablet Take 1 tablet by mouth daily.      Historical Provider, MD  oxyCODONE-acetaminophen (PERCOCET/ROXICET) 5-325 MG per tablet Take 1-2 tablets by mouth every 4 (four) hours as needed for moderate pain. 03/12/14   Charlynne Pander, DDS  polyethylene glycol (MIRALAX / Ethelene Hal) packet Take 17 g by mouth daily as needed for moderate constipation. 04/09/14   Rhonda G Barrett, PA-C  potassium chloride SA (KLOR-CON M20) 20 MEQ tablet Take 1 tablet (20 mEq total) by mouth 2 (two) times daily. 08/20/14   Laqueta Linden, MD  predniSONE (DELTASONE) 5 MG tablet Take 5 mg by mouth daily with breakfast.    Historical Provider, MD  torsemide (DEMADEX) 20 MG tablet Take twice daily may take extra as needed for swelling 08/30/14   Laqueta Linden, MD   BP 113/84 mmHg  Pulse 117  Temp(Src) 98.1 F (36.7 C) (Oral)  Resp 22  Ht  (1.803 m)  Wt 260 lb (117.935 kg)  BMI 36.28 kg/m2  SpO2 100% Physical Exam  Constitutional: He is oriented to person, place, and time. He appears well-developed and well-nourished. No distress.  HENT:  Head: Normocephalic and atraumatic.  Eyes: EOM are normal. Pupils are equal, round, and reactive to light.  Neck: Neck supple.  Cardiovascular: Normal heart sounds.   No murmur heard. Tachycardia, irregular rate  Pulmonary/Chest: Effort normal and breath  sounds normal. No respiratory distress. He has no wheezes.  Abdominal: Soft. Bowel sounds are normal. There is no tenderness. There is no rebound.  Musculoskeletal: He exhibits edema.  2+ bilateral lower extremity edema  Lymphadenopathy:    He has no cervical adenopathy.  Neurological: He is alert and oriented to person, place, and time.  5 out of 5 strength in all 4 extremities, no dysmetria to finger-nose-finger, gait testing deferred, cranial nerves II through XII intact  Skin: Skin is warm and dry.  Psychiatric: He has a normal mood and affect.  Nursing note and vitals reviewed.   ED Course  Procedures (including critical care time) Labs Review Labs Reviewed  COMPREHENSIVE METABOLIC PANEL - Abnormal; Notable for the following:    Potassium 3.3 (*)    Chloride 96 (*)    CO2 33 (*)    Glucose, Bld 198 (*)    Calcium 8.5 (*)    Albumin 2.7 (*)    AST 73 (*)    Alkaline Phosphatase 144 (*)    All other components within normal limits  LACTIC ACID, PLASMA - Abnormal; Notable for the following:    Lactic Acid, Venous 2.2 (*)    All other components within normal limits  LACTIC ACID, PLASMA - Abnormal; Notable for the following:    Lactic Acid, Venous 2.3 (*)    All other components within normal limits  CBC WITH DIFFERENTIAL/PLATELET - Abnormal; Notable for the following:    RDW 16.0 (*)    Platelets 88 (*)    All other components within normal limits  URINALYSIS, ROUTINE W REFLEX MICROSCOPIC (NOT AT [facility]) - Abnormal; Notable for the following:    Color, Urine AMBER (*)    Hgb urine dipstick LARGE (*)    Protein, ur 100 (*)    Urobilinogen, UA 4.0 (*)    All other components within normal limits  URINE MICROSCOPIC-ADD ON - Abnormal; Notable for the following:    Squamous Epithelial / LPF FEW (*)    Bacteria, UA FEW (*)    Casts HYALINE CASTS (*)    All other components within normal limits  TROPONIN I  BRAIN NATRIURETIC PEPTIDE    Imaging Review Dg Chest 2  View  10/29/2014   CLINICAL DATA:  Dyspnea and weakness, recent worsening. Fell twice last night.  EXAM: CHEST  2 VIEW  COMPARISON:  04/10/2014  FINDINGS: There is prior sternotomy with CABG and aortic valvuloplasty. Heart size is normal. The lungs are clear except for mild curvilinear basilar scarring. There is no pleural effusion. The pulmonary vasculature is normal. Hilar and mediastinal contours are unremarkable.  IMPRESSION: No acute cardiopulmonary findings.   Electronically Signed   By: Ellery Plunk M.D.   On: 10/29/2014 00:14   Ct Head Wo Contrast  10/29/2014   CLINICAL DATA:  Frequent falls, fell twice last night. Hit head on chair. Weakness.  EXAM: CT HEAD WITHOUT CONTRAST  TECHNIQUE: Contiguous axial images were obtained from the base of the skull through the vertex without intravenous contrast.  COMPARISON:  None.  FINDINGS: No intracranial hemorrhage, mass effect, or midline shift. No hydrocephalus. The basilar cisterns are patent. No evidence of territorial infarct. No intracranial fluid collection. Calvarium is intact. Included paranasal sinuses and mastoid air cells are well aerated.  IMPRESSION: No acute intracranial abnormality.   Electronically Signed   By: Rubye Oaks M.D.   On: 10/29/2014 00:46   Dg Hips Bilat With Pelvis Min 5 Views  10/29/2014   CLINICAL DATA:  Bilateral hip pain.  Fell twice last night.  EXAM: BILATERAL HIP (WITH PELVIS) 5-6 VIEWS  COMPARISON:  None.  FINDINGS: Negative for acute fracture or dislocation  about either hip. There are calcifications adjacent to the left greater trochanter which may represent sequelae from trochanteric bursitis. There is no bone lesion or bony destruction. No significant arthritic changes are evident.  IMPRESSION: Negative for acute fracture or dislocation.   Electronically Signed   By: Ellery Plunk M.D.   On: 10/29/2014 01:17     EKG Interpretation Atrial fibrillation with a rate of 107, right bundle branch block and  left anterior fascicular branch block, when compared to prior, right bundle branch block is more prominent.  Patient has history of left bundle branch block      MDM   Final diagnoses:  Fall  Disequilibrium   Patient presents with frequent falls and concern for disequilibrium. Also reports shortness of breath.  Vital signs notable for heart rates in the 110s with atrial fibrillation. Initial blood pressure 104/69. Patient systemically appears volume overloaded; however, suspect patient may be intravascularly depleted.  Lab work obtained. Mild lactic acidosis of 2.2 without evidence of sepsis otherwise. Troponin negative. Potassium slightly low at 3.3.  Patient with preserved EF so he was given fluids given his rate. At this time, diltiazem and other rate controlling medications were held given his low blood pressure. Imaging including chest x-ray, hip films, and CT are negative. Repeat blood pressure was reassuring. Patient persistently tachycardic and was given 10 mg of IV diltiazem. At this time it is unclear of why patient is feeling off balance and has had recurrent falls. Stroke would certainly be consideration. There is no obvious evidence of cerebellar dysfunction on exam. Patient also may be intravascularly volume depleted although he says signs of lower extremity edema. Feel patient would benefit from admission with gentle hydration, possible repeat echo and MRI to rule out stroke. Discuss with Dr. Conley Rolls.  Shon Baton, MD 10/29/14 [phone]

## 2014-10-29 NOTE — ED Notes (Signed)
Report given to floor,  

## 2014-10-29 NOTE — ED Notes (Signed)
Dr Le at bedside,  

## 2014-10-30 DIAGNOSIS — Z7902 Long term (current) use of antithrombotics/antiplatelets: Secondary | ICD-10-CM | POA: Diagnosis not present

## 2014-10-30 DIAGNOSIS — I4891 Unspecified atrial fibrillation: Secondary | ICD-10-CM | POA: Diagnosis present

## 2014-10-30 DIAGNOSIS — D696 Thrombocytopenia, unspecified: Secondary | ICD-10-CM | POA: Diagnosis not present

## 2014-10-30 DIAGNOSIS — Z6836 Body mass index (BMI) 36.0-36.9, adult: Secondary | ICD-10-CM | POA: Diagnosis not present

## 2014-10-30 DIAGNOSIS — E872 Acidosis: Secondary | ICD-10-CM | POA: Diagnosis present

## 2014-10-30 DIAGNOSIS — M069 Rheumatoid arthritis, unspecified: Secondary | ICD-10-CM | POA: Diagnosis present

## 2014-10-30 DIAGNOSIS — R0602 Shortness of breath: Secondary | ICD-10-CM | POA: Diagnosis present

## 2014-10-30 DIAGNOSIS — I4892 Unspecified atrial flutter: Secondary | ICD-10-CM

## 2014-10-30 DIAGNOSIS — E876 Hypokalemia: Secondary | ICD-10-CM | POA: Diagnosis present

## 2014-10-30 DIAGNOSIS — Z9181 History of falling: Secondary | ICD-10-CM | POA: Diagnosis not present

## 2014-10-30 DIAGNOSIS — Z823 Family history of stroke: Secondary | ICD-10-CM | POA: Diagnosis not present

## 2014-10-30 DIAGNOSIS — I350 Nonrheumatic aortic (valve) stenosis: Secondary | ICD-10-CM

## 2014-10-30 DIAGNOSIS — Z87891 Personal history of nicotine dependence: Secondary | ICD-10-CM | POA: Diagnosis not present

## 2014-10-30 DIAGNOSIS — J9611 Chronic respiratory failure with hypoxia: Secondary | ICD-10-CM | POA: Diagnosis not present

## 2014-10-30 DIAGNOSIS — Z825 Family history of asthma and other chronic lower respiratory diseases: Secondary | ICD-10-CM | POA: Diagnosis not present

## 2014-10-30 DIAGNOSIS — I5033 Acute on chronic diastolic (congestive) heart failure: Principal | ICD-10-CM

## 2014-10-30 DIAGNOSIS — Z7951 Long term (current) use of inhaled steroids: Secondary | ICD-10-CM | POA: Diagnosis not present

## 2014-10-30 DIAGNOSIS — J449 Chronic obstructive pulmonary disease, unspecified: Secondary | ICD-10-CM | POA: Diagnosis present

## 2014-10-30 DIAGNOSIS — Z9981 Dependence on supplemental oxygen: Secondary | ICD-10-CM | POA: Diagnosis not present

## 2014-10-30 DIAGNOSIS — Z8249 Family history of ischemic heart disease and other diseases of the circulatory system: Secondary | ICD-10-CM | POA: Diagnosis not present

## 2014-10-30 DIAGNOSIS — K7460 Unspecified cirrhosis of liver: Secondary | ICD-10-CM | POA: Diagnosis present

## 2014-10-30 DIAGNOSIS — Z7952 Long term (current) use of systemic steroids: Secondary | ICD-10-CM | POA: Diagnosis not present

## 2014-10-30 DIAGNOSIS — E119 Type 2 diabetes mellitus without complications: Secondary | ICD-10-CM | POA: Diagnosis present

## 2014-10-30 DIAGNOSIS — Z800 Family history of malignant neoplasm of digestive organs: Secondary | ICD-10-CM | POA: Diagnosis not present

## 2014-10-30 DIAGNOSIS — Z952 Presence of prosthetic heart valve: Secondary | ICD-10-CM | POA: Diagnosis not present

## 2014-10-30 DIAGNOSIS — J9610 Chronic respiratory failure, unspecified whether with hypoxia or hypercapnia: Secondary | ICD-10-CM | POA: Diagnosis present

## 2014-10-30 DIAGNOSIS — D649 Anemia, unspecified: Secondary | ICD-10-CM | POA: Diagnosis present

## 2014-10-30 DIAGNOSIS — R270 Ataxia, unspecified: Secondary | ICD-10-CM | POA: Diagnosis not present

## 2014-10-30 DIAGNOSIS — Z7982 Long term (current) use of aspirin: Secondary | ICD-10-CM | POA: Diagnosis not present

## 2014-10-30 DIAGNOSIS — I10 Essential (primary) hypertension: Secondary | ICD-10-CM | POA: Diagnosis present

## 2014-10-30 DIAGNOSIS — Z955 Presence of coronary angioplasty implant and graft: Secondary | ICD-10-CM | POA: Diagnosis not present

## 2014-10-30 LAB — CBC
HCT: 39.5 % (ref 39.0–52.0)
HEMOGLOBIN: 12.8 g/dL — AB (ref 13.0–17.0)
MCH: 31.7 pg (ref 26.0–34.0)
MCHC: 32.4 g/dL (ref 30.0–36.0)
MCV: 97.8 fL (ref 78.0–100.0)
PLATELETS: 83 10*3/uL — AB (ref 150–400)
RBC: 4.04 MIL/uL — ABNORMAL LOW (ref 4.22–5.81)
RDW: 15.9 % — ABNORMAL HIGH (ref 11.5–15.5)
WBC: 5.7 10*3/uL (ref 4.0–10.5)

## 2014-10-30 LAB — VITAMIN B12: Vitamin B-12: 442 pg/mL (ref 180–914)

## 2014-10-30 LAB — HEMOGLOBIN A1C
HEMOGLOBIN A1C: 7 % — AB (ref 4.8–5.6)
MEAN PLASMA GLUCOSE: 154 mg/dL

## 2014-10-30 LAB — BASIC METABOLIC PANEL
Anion gap: 9 (ref 5–15)
BUN: 16 mg/dL (ref 6–20)
CO2: 33 mmol/L — ABNORMAL HIGH (ref 22–32)
CREATININE: 0.91 mg/dL (ref 0.61–1.24)
Calcium: 8.4 mg/dL — ABNORMAL LOW (ref 8.9–10.3)
Chloride: 97 mmol/L — ABNORMAL LOW (ref 101–111)
Glucose, Bld: 128 mg/dL — ABNORMAL HIGH (ref 65–99)
POTASSIUM: 3.4 mmol/L — AB (ref 3.5–5.1)
Sodium: 139 mmol/L (ref 135–145)

## 2014-10-30 LAB — GLUCOSE, CAPILLARY
Glucose-Capillary: 122 mg/dL — ABNORMAL HIGH (ref 65–99)
Glucose-Capillary: 128 mg/dL — ABNORMAL HIGH (ref 65–99)
Glucose-Capillary: 128 mg/dL — ABNORMAL HIGH (ref 65–99)
Glucose-Capillary: 139 mg/dL — ABNORMAL HIGH (ref 65–99)
Glucose-Capillary: 149 mg/dL — ABNORMAL HIGH (ref 65–99)

## 2014-10-30 MED ORDER — FUROSEMIDE 10 MG/ML IJ SOLN
60.0000 mg | Freq: Two times a day (BID) | INTRAMUSCULAR | Status: DC
  Administered 2014-10-30 – 2014-11-01 (×4): 60 mg via INTRAVENOUS
  Filled 2014-10-30 (×4): qty 6

## 2014-10-30 NOTE — Progress Notes (Signed)
[facility] PROGRESS NOTE  [name] LTJ:[id number] DOB: [birth year] DOA: 10/28/2014 PCP: Donzetta Sprung, MD  Assessment/Plan: 1. 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 MRI brain is negative for acute infarct. Was seen by physical therapy recommended home health therapy. Clinically he feels he is improving. Vitamin B 12 levels normal. He does not have any focal deficits. 2. Acute on chronic diastolic congestive heart failure. Patient's weight is approximately 20 pounds above his dry weight. He is noticing worsening lower extremity edema. We'll start the patient on intravenous Lasix. Echocardiogram was recently done in 05/2014 that showed normal ejection fraction. 3. History of atrial flutter. Patient presented with atrial fibrillation on admission which has since converted back to sinus rhythm. He is followed by cardiology and felt to be poor candidate for anticoagulation. 4. Thrombocytopenia. Appears to be chronic. 5. Chronic respiratory failure. Continue supplemental oxygen 6. Oxygen dependent/steroid-dependent COPD . patient being her baseline. Continue prednisone and bronchodilators. 7. Aortic stenosis status post TAVR. Follow-up with cardiology   Code Status: Full code Family Communication: Discussed with patient and wife at the bedside Disposition Plan: Discharge home once improved   Consultants:    Procedures:    Antibiotics:    HPI/Subjective: Feels that legs are edematous. Feels breathing has improved since admission and is approaching baseline. He still unsteady on his feet, but feels that he is improving.  Objective: Filed Vitals:   10/30/14 1440  BP: 130/51  Pulse: 80  Temp: 98.1 F (36.7 C)  Resp: 16    Intake/Output Summary (Last 24 hours) at 10/30/14 1841 Last data filed at 10/30/14 1836  Gross per 24 hour  Intake 1608.33 ml  Output    500 ml  Net 1108.33 ml   Filed Weights   10/28/14 2218  10/29/14 0438  Weight: 117.935 kg (260 lb) 127.461 kg (281 lb)    Exam:   General:  NAD, sitting up on side of bed  Cardiovascular: s1, s2, rrr  Respiratory: crackles at bases  Abdomen: soft, nt, nd, bs+  Musculoskeletal: 2+ edema b/l   Data Reviewed: Basic Metabolic Panel:  Recent Labs Lab 10/28/14 2250 10/30/14 0653  NA 136 139  K 3.3* 3.4*  CL 96* 97*  CO2 33* 33*  GLUCOSE 198* 128*  BUN 16 16  CREATININE 0.94 0.91  CALCIUM 8.5* 8.4*   Liver Function Tests:  Recent Labs Lab 10/28/14 2250  AST 73*  ALT 48  ALKPHOS 144*  BILITOT 1.1  PROT 6.6  ALBUMIN 2.7*   No results for input(s): LIPASE, AMYLASE in the last 168 hours. No results for input(s): AMMONIA in the last 168 hours. CBC:  Recent Labs Lab 10/28/14 2250 10/30/14 0653  WBC 6.3 5.7  NEUTROABS 4.0  --   HGB 13.3 12.8*  HCT 41.5 39.5  MCV 97.2 97.8  PLT 88* 83*   Cardiac Enzymes:  Recent Labs Lab 10/28/14 2250  TROPONINI <0.03   BNP (last 3 results)  Recent Labs  10/28/14 2250  BNP 76.0    ProBNP (last 3 results)  Recent Labs  04/08/14 0359  PROBNP 237.7*    CBG:  Recent Labs Lab 10/30/14 0009 10/30/14 0332 10/30/14 0731 10/30/14 1147 10/30/14 1627  GLUCAP 128* 139* 122* 128* 149*    Recent Results (from the past 240 hour(s))  MRSA PCR Screening     Status: Abnormal   Collection Time: 10/29/14 10:05 AM  Result Value Ref Range Status   MRSA by PCR POSITIVE (A)  NEGATIVE Final    Comment:        The GeneXpert MRSA Assay (FDA approved for NASAL specimens only), is one component of a comprehensive MRSA colonization surveillance program. It is not intended to diagnose MRSA infection nor to guide or monitor treatment for MRSA infections. RESULT CALLED TO, READ BACK BY AND VERIFIED WITH: CHAPPELLE,R. AT 1322 ON 10/29/2014 BY BAUGHAM,M.      Studies: Dg Chest 2 View  10/29/2014   CLINICAL DATA:  Dyspnea and weakness, recent worsening. Fell twice last  night.  EXAM: CHEST  2 VIEW  COMPARISON:  04/10/2014  FINDINGS: There is prior sternotomy with CABG and aortic valvuloplasty. Heart size is normal. The lungs are clear except for mild curvilinear basilar scarring. There is no pleural effusion. The pulmonary vasculature is normal. Hilar and mediastinal contours are unremarkable.  IMPRESSION: No acute cardiopulmonary findings.   Electronically Signed   By: Ellery Plunk M.D.   On: 10/29/2014 00:14   Ct Head Wo Contrast  10/29/2014   CLINICAL DATA:  Frequent falls, fell twice last night. Hit head on chair. Weakness.  EXAM: CT HEAD WITHOUT CONTRAST  TECHNIQUE: Contiguous axial images were obtained from the base of the skull through the vertex without intravenous contrast.  COMPARISON:  None.  FINDINGS: No intracranial hemorrhage, mass effect, or midline shift. No hydrocephalus. The basilar cisterns are patent. No evidence of territorial infarct. No intracranial fluid collection. Calvarium is intact. Included paranasal sinuses and mastoid air cells are well aerated.  IMPRESSION: No acute intracranial abnormality.   Electronically Signed   By: Rubye Oaks M.D.   On: 10/29/2014 00:46   Mr Brain Wo Contrast  10/29/2014   CLINICAL DATA:  Ataxia.  Multiple falls.  EXAM: MRI HEAD WITHOUT CONTRAST  TECHNIQUE: Multiplanar, multiecho pulse sequences of the brain and surrounding structures were obtained without intravenous contrast.  COMPARISON:  CT head without contrast 10/28/2014.  FINDINGS: The diffusion weighted images demonstrate no evidence for acute or subacute infarction. No acute hemorrhage or mass lesion is present. Mild generalized atrophy is evident. There is no significant white matter disease. Minimal periventricular changes are within normal limits for age.  Cerebellar atrophy is proportionate to this supratentorial atrophy.  Flow is present in the major intracranial arteries. The globes and orbits are intact. Minimal fluid or mucosal thickening is  present posteriorly in the left maxillary sinus. The remaining paranasal sinuses and the mastoid air cells are clear.  Skullbase is within normal limits.  Midline structures are normal.  IMPRESSION: 1. Mild generalized cerebral and cerebellar atrophy without acute or focal abnormality to explain the patient's symptoms. 2. Minimal left maxillary sinus disease.   Electronically Signed   By: Marin Roberts M.D.   On: 10/29/2014 20:06   Dg Hips Bilat With Pelvis Min 5 Views  10/29/2014   CLINICAL DATA:  Bilateral hip pain.  Fell twice last night.  EXAM: BILATERAL HIP (WITH PELVIS) 5-6 VIEWS  COMPARISON:  None.  FINDINGS: Negative for acute fracture or dislocation about either hip. There are calcifications adjacent to the left greater trochanter which may represent sequelae from trochanteric bursitis. There is no bone lesion or bony destruction. No significant arthritic changes are evident.  IMPRESSION: Negative for acute fracture or dislocation.   Electronically Signed   By: Ellery Plunk M.D.   On: 10/29/2014 01:17    Scheduled Meds: . aspirin  81 mg Oral Daily  . budesonide  0.25 mg Nebulization BID  . Chlorhexidine Gluconate Cloth  6 each Topical Q0600  . clopidogrel  75 mg Oral Daily  . diltiazem  60 mg Oral BID  . furosemide  60 mg Intravenous BID  . heparin  5,000 Units Subcutaneous 3 times per day  . insulin aspart  0-20 Units Subcutaneous 6 times per day  . ipratropium-albuterol  3 mL Nebulization Q6H  . metFORMIN  1,000 mg Oral BID WC  . metoprolol succinate  25 mg Oral BID  . multivitamin with minerals  1 tablet Oral Daily  . mupirocin ointment  1 application Nasal BID  . potassium chloride  40 mEq Oral Daily  . predniSONE  5 mg Oral Q breakfast  . sodium chloride  3 mL Intravenous Q12H   Continuous Infusions:   Principal Problem:   Ataxia Active Problems:   Atrial flutter   CHF (congestive heart failure)   S/P CABG x 5   Morbid obesity   Chronic diastolic congestive  heart failure   S/P TAVR (transcatheter aortic valve replacement)   Severe aortic valve stenosis   Thrombocytopenia    Time spent:    Vada Swift  [facility] Pager [phone]. If 7PM-7AM, please contact night-coverage at [email or web address], password [facility] 10/30/2014, 6:41 PM  LOS: 0 days

## 2014-10-31 LAB — GLUCOSE, CAPILLARY
GLUCOSE-CAPILLARY: 157 mg/dL — AB (ref 65–99)
Glucose-Capillary: 114 mg/dL — ABNORMAL HIGH (ref 65–99)
Glucose-Capillary: 137 mg/dL — ABNORMAL HIGH (ref 65–99)
Glucose-Capillary: 138 mg/dL — ABNORMAL HIGH (ref 65–99)
Glucose-Capillary: 157 mg/dL — ABNORMAL HIGH (ref 65–99)
Glucose-Capillary: 162 mg/dL — ABNORMAL HIGH (ref 65–99)
Glucose-Capillary: 184 mg/dL — ABNORMAL HIGH (ref 65–99)

## 2014-10-31 LAB — BASIC METABOLIC PANEL
Anion gap: 10 (ref 5–15)
BUN: 17 mg/dL (ref 6–20)
CO2: 29 mmol/L (ref 22–32)
Calcium: 8.6 mg/dL — ABNORMAL LOW (ref 8.9–10.3)
Chloride: 98 mmol/L — ABNORMAL LOW (ref 101–111)
Creatinine, Ser: 0.82 mg/dL (ref 0.61–1.24)
Glucose, Bld: 143 mg/dL — ABNORMAL HIGH (ref 65–99)
Potassium: 3.5 mmol/L (ref 3.5–5.1)
SODIUM: 137 mmol/L (ref 135–145)

## 2014-10-31 LAB — CBC
HEMATOCRIT: 39.8 % (ref 39.0–52.0)
Hemoglobin: 13 g/dL (ref 13.0–17.0)
MCH: 31.3 pg (ref 26.0–34.0)
MCHC: 32.7 g/dL (ref 30.0–36.0)
MCV: 95.9 fL (ref 78.0–100.0)
PLATELETS: 98 10*3/uL — AB (ref 150–400)
RBC: 4.15 MIL/uL — AB (ref 4.22–5.81)
RDW: 15.9 % — ABNORMAL HIGH (ref 11.5–15.5)
WBC: 5.3 10*3/uL (ref 4.0–10.5)

## 2014-10-31 MED ORDER — ZOLPIDEM TARTRATE 5 MG PO TABS
5.0000 mg | ORAL_TABLET | Freq: Once | ORAL | Status: DC
  Filled 2014-10-31: qty 1

## 2014-10-31 MED ORDER — DILTIAZEM HCL ER COATED BEADS 180 MG PO CP24
180.0000 mg | ORAL_CAPSULE | Freq: Every day | ORAL | Status: DC
  Administered 2014-10-31 – 2014-11-04 (×5): 180 mg via ORAL
  Filled 2014-10-31 (×5): qty 1

## 2014-10-31 NOTE — Progress Notes (Signed)
Physical Therapy Treatment Patient Details Name: [name] MRN: [id number] DOB: 01-01-1946 Today's Date: 10/31/2014    History of Present Illness      PT Comments    Pt very pleasant and eager to participate with therapy today.  Pt did c/o back and Lt LE pain, advised he had received pain medication approximately 1 hour prior to therapy session. Pt semirecumbent upon therapist arrival and no nasal canal on.  O2 sats at 95% prior therapy with room air.  Bed exercises and gait training with focus on Lt LE strengthening and improving functional strengthening with gait.  Pt independent with bed mobility supine to sitting though difficult from sidelying to sitting but no assistance required.  Sit to stand min guard following cueing for hand placement for assistance and safety.  Increased distance with gait training today with max difficulty progressing Lt LE forward but no assistance required.  Pt very stable with standard walker, recommend trying gait training with RW next session.  End of session pt left in chair with guest and RN in room, call bell within reach and chair alarm set.  Pt stated pain reduced though did not state number.  O2 sats at 95% with room air following gait.  Follow Up Recommendations        Equipment Recommendations       Recommendations for Other Services       Precautions / Restrictions Precautions Precautions: Fall Precaution Comments: orange contact Restrictions Weight Bearing Restrictions: No    Mobility  Bed Mobility Overal bed mobility: Modified Independent Bed Mobility: Supine to Sit     Supine to sit: Supervision        Transfers Overall transfer level: Needs assistance Equipment used: Standard walker Transfers: Sit to/from Stand Sit to Stand: Supervision            Ambulation/Gait Ambulation/Gait assistance: Min guard Ambulation Distance (Feet): 40 Feet Assistive device: Standard walker Gait Pattern/deviations: Decreased  step length - left;Decreased stance time - left;Decreased step length - right Gait velocity: appropriate for the situation       Stairs            Wheelchair Mobility    Modified Rankin (Stroke Patients Only)       Balance                                    Cognition Arousal/Alertness: Awake/alert Behavior During Therapy: WFL for tasks assessed/performed Overall Cognitive Status: Within Functional Limits for tasks assessed                      Exercises General Exercises - Lower Extremity Ankle Circles/Pumps: AROM;Both;10 reps Long Arc Quad: AROM;Both;10 reps Heel Slides: AROM;Both;10 reps Hip Flexion/Marching: AROM;Both;Supine    General Comments        Pertinent Vitals/Pain Pain Assessment: 0-10 Pain Score: 7  Pain Location: Back and Lt LE Pain Descriptors / Indicators: Burning    Home Living                      Prior Function            PT Goals (current goals can now be found in the care plan section) Progress towards PT goals: Progressing toward goals    Frequency       PT Plan Current plan remains appropriate    Co-evaluation  End of Session Equipment Utilized During Treatment: Gait belt Activity Tolerance: Patient limited by fatigue;Patient tolerated treatment well Patient left: in chair;with call bell/phone within reach;with chair alarm set;with family/visitor present     Time: 1410-1443 PT Time Calculation (min) (ACUTE ONLY): 33 min  Charges:  $Gait Training: 8-22 mins $Therapeutic Exercise: 8-22 mins $Therapeutic Activity: 8-22 mins                    G Codes:      Juel Burrow 10/31/2014, 2:53 PM

## 2014-10-31 NOTE — Progress Notes (Signed)
[facility] PROGRESS NOTE  [name] LDJ:[id number] DOB: 12-09-1945 DOA: 10/28/2014 PCP: Donzetta Sprung, MD  Assessment/Plan: 1. 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 MRI brain is negative for acute infarct. Was seen by physical therapy recommended home health therapy. Clinically he feels he is improving. Vitamin B 12 levels normal. He does not have any focal deficits. 2. Acute on chronic diastolic congestive heart failure. Lower extremity edema appears to be slowly improving. Urine output has been good and renal function is stable. Continue current dose of IV Lasix. Echocardiogram was recently done in 05/2014 that showed normal ejection fraction. 3. History of atrial flutter. Patient presented with atrial fibrillation on admission which has since converted back to sinus rhythm. He did have some episodes of tachycardia today. Will increase diltiazem dosing. Continue Toprol. He is followed by cardiology and felt to be poor candidate for anticoagulation. 4. Thrombocytopenia. Appears to be chronic. 5. Chronic respiratory failure. Continue supplemental oxygen 6. Oxygen dependent/steroid-dependent COPD . Patient feels breathing is near baseline. Continue prednisone and bronchodilators. 7. Aortic stenosis status post TAVR. Follow-up with cardiology   Code Status: Full code Family Communication: Discussed with patient and wife at the bedside Disposition Plan: Discharge home once improved   Consultants:    Procedures:    Antibiotics:    HPI/Subjective: Patient is feeling stronger today. He feels more steady on his feet. Feels her breathing is at baseline. Feels lower extremity edema is improving.  Objective: Filed Vitals:   10/31/14 1447  BP: 132/52  Pulse: 93  Temp: 97.7 F (36.5 C)  Resp: 16    Intake/Output Summary (Last 24 hours) at 10/31/14 1907 Last data filed at 10/31/14 1447  Gross per 24 hour  Intake    480 ml   Output   1950 ml  Net  -1470 ml   Filed Weights   10/28/14 2218 10/29/14 0438 10/31/14 0418  Weight: 117.935 kg (260 lb) 127.461 kg (281 lb) 128.141 kg (282 lb 8 oz)    Exam:   General:  NAD, sitting up in chair  Cardiovascular: s1, s2, rrr  Respiratory: crackles at bases  Abdomen: soft, nt, nd, bs+  Musculoskeletal: 1+ edema b/l   Data Reviewed: Basic Metabolic Panel:  Recent Labs Lab 10/28/14 2250 10/30/14 0653 10/31/14 0608  NA 136 139 137  K 3.3* 3.4* 3.5  CL 96* 97* 98*  CO2 33* 33* 29  GLUCOSE 198* 128* 143*  BUN 16 16 17   CREATININE 0.94 0.91 0.82  CALCIUM 8.5* 8.4* 8.6*   Liver Function Tests:  Recent Labs Lab 10/28/14 2250  AST 73*  ALT 48  ALKPHOS 144*  BILITOT 1.1  PROT 6.6  ALBUMIN 2.7*   No results for input(s): LIPASE, AMYLASE in the last 168 hours. No results for input(s): AMMONIA in the last 168 hours. CBC:  Recent Labs Lab 10/28/14 2250 10/30/14 0653 10/31/14 0608  WBC 6.3 5.7 5.3  NEUTROABS 4.0  --   --   HGB 13.3 12.8* 13.0  HCT 41.5 39.5 39.8  MCV 97.2 97.8 95.9  PLT 88* 83* 98*   Cardiac Enzymes:  Recent Labs Lab 10/28/14 2250  TROPONINI <0.03   BNP (last 3 results)  Recent Labs  10/28/14 2250  BNP 76.0    ProBNP (last 3 results)  Recent Labs  04/08/14 0359  PROBNP 237.7*    CBG:  Recent Labs Lab 10/31/14 0057 10/31/14 0409 10/31/14 0742 10/31/14 1145 10/31/14 1650  GLUCAP 162* 138* 114*  157* 184*    Recent Results (from the past 240 hour(s))  MRSA PCR Screening     Status: Abnormal   Collection Time: 10/29/14 10:05 AM  Result Value Ref Range Status   MRSA by PCR POSITIVE (A) NEGATIVE Final    Comment:        The GeneXpert MRSA Assay (FDA approved for NASAL specimens only), is one component of a comprehensive MRSA colonization surveillance program. It is not intended to diagnose MRSA infection nor to guide or monitor treatment for MRSA infections. RESULT CALLED TO, READ BACK BY  AND VERIFIED WITH: CHAPPELLE,R. AT 1322 ON 10/29/2014 BY BAUGHAM,M.      Studies: Mr Brain Wo Contrast  10/29/2014   CLINICAL DATA:  Ataxia.  Multiple falls.  EXAM: MRI HEAD WITHOUT CONTRAST  TECHNIQUE: Multiplanar, multiecho pulse sequences of the brain and surrounding structures were obtained without intravenous contrast.  COMPARISON:  CT head without contrast 10/28/2014.  FINDINGS: The diffusion weighted images demonstrate no evidence for acute or subacute infarction. No acute hemorrhage or mass lesion is present. Mild generalized atrophy is evident. There is no significant white matter disease. Minimal periventricular changes are within normal limits for age.  Cerebellar atrophy is proportionate to this supratentorial atrophy.  Flow is present in the major intracranial arteries. The globes and orbits are intact. Minimal fluid or mucosal thickening is present posteriorly in the left maxillary sinus. The remaining paranasal sinuses and the mastoid air cells are clear.  Skullbase is within normal limits.  Midline structures are normal.  IMPRESSION: 1. Mild generalized cerebral and cerebellar atrophy without acute or focal abnormality to explain the patient's symptoms. 2. Minimal left maxillary sinus disease.   Electronically Signed   By: Marin Roberts M.D.   On: 10/29/2014 20:06    Scheduled Meds: . aspirin  81 mg Oral Daily  . budesonide  0.25 mg Nebulization BID  . Chlorhexidine Gluconate Cloth  6 each Topical Q0600  . clopidogrel  75 mg Oral Daily  . diltiazem  180 mg Oral Daily  . furosemide  60 mg Intravenous BID  . heparin  5,000 Units Subcutaneous 3 times per day  . insulin aspart  0-20 Units Subcutaneous 6 times per day  . ipratropium-albuterol  3 mL Nebulization Q6H  . metFORMIN  1,000 mg Oral BID WC  . metoprolol succinate  25 mg Oral BID  . multivitamin with minerals  1 tablet Oral Daily  . mupirocin ointment  1 application Nasal BID  . potassium chloride  40 mEq Oral Daily   . predniSONE  5 mg Oral Q breakfast  . sodium chloride  3 mL Intravenous Q12H   Continuous Infusions:   Principal Problem:   Ataxia Active Problems:   Atrial flutter   CHF (congestive heart failure)   S/P CABG x 5   Morbid obesity   Chronic diastolic congestive heart failure   S/P TAVR (transcatheter aortic valve replacement)   Severe aortic valve stenosis   Thrombocytopenia    Time spent:    Zakya Halabi  [facility] Pager [phone]. If 7PM-7AM, please contact night-coverage at [email or web address], password [facility] 10/31/2014, 7:07 PM  LOS: 1 day

## 2014-11-01 DIAGNOSIS — N179 Acute kidney failure, unspecified: Secondary | ICD-10-CM

## 2014-11-01 HISTORY — DX: Acute kidney failure, unspecified: N17.9

## 2014-11-01 LAB — GLUCOSE, CAPILLARY
GLUCOSE-CAPILLARY: 143 mg/dL — AB (ref 65–99)
GLUCOSE-CAPILLARY: 117 mg/dL — AB (ref 65–99)
GLUCOSE-CAPILLARY: 112 mg/dL — AB (ref 65–99)
Glucose-Capillary: 135 mg/dL — ABNORMAL HIGH (ref 65–99)
Glucose-Capillary: 170 mg/dL — ABNORMAL HIGH (ref 65–99)
Glucose-Capillary: 160 mg/dL — ABNORMAL HIGH (ref 65–99)

## 2014-11-01 LAB — BASIC METABOLIC PANEL
Anion gap: 10 (ref 5–15)
BUN: 16 mg/dL (ref 6–20)
CALCIUM: 8.8 mg/dL — AB (ref 8.9–10.3)
CHLORIDE: 98 mmol/L — AB (ref 101–111)
CO2: 31 mmol/L (ref 22–32)
Creatinine, Ser: 0.83 mg/dL (ref 0.61–1.24)
GFR calc non Af Amer: >60 mL/min (ref >60)
Glucose, Bld: 127 mg/dL — ABNORMAL HIGH (ref 65–99)
POTASSIUM: 3.7 mmol/L (ref 3.5–5.1)
Sodium: 139 mmol/L (ref 135–145)

## 2014-11-01 MED ORDER — INSULIN ASPART 100 UNIT/ML ~~LOC~~ SOLN: 0.0000 [IU] | Freq: Every day | SUBCUTANEOUS | Status: DC

## 2014-11-01 MED ORDER — FUROSEMIDE 10 MG/ML IJ SOLN
80.0000 mg | Freq: Two times a day (BID) | INTRAMUSCULAR | Status: DC
  Administered 2014-11-01 – 2014-11-04 (×6): 80 mg via INTRAVENOUS
  Filled 2014-11-01 (×6): qty 8

## 2014-11-01 MED ORDER — METOLAZONE 5 MG PO TABS
5.0000 mg | ORAL_TABLET | Freq: Every day | ORAL | Status: DC
  Administered 2014-11-01 – 2014-11-04 (×4): 5 mg via ORAL
  Filled 2014-11-01 (×4): qty 1

## 2014-11-01 MED ORDER — INSULIN ASPART 100 UNIT/ML ~~LOC~~ SOLN
0.0000 [IU] | Freq: Three times a day (TID) | SUBCUTANEOUS | Status: DC
  Administered 2014-11-02: 4 [IU] via SUBCUTANEOUS
  Administered 2014-11-02 (×2): 3 [IU] via SUBCUTANEOUS
  Administered 2014-11-03: 4 [IU] via SUBCUTANEOUS
  Administered 2014-11-03 (×2): 3 [IU] via SUBCUTANEOUS
  Administered 2014-11-04 (×2): 4 [IU] via SUBCUTANEOUS

## 2014-11-01 NOTE — Care Management Note (Signed)
Case Management Note  Patient Details  Name: IMANUEL PRUIETT MRN: [id number] Date of Birth: [birth year]  Expected Discharge Date:                  Expected Discharge Plan:  Home w Home Health Services  In-House Referral:  NA  Discharge planning Services  CM Consult  Post Acute Care Choice:  Home Health Choice offered to:  Spouse  DME Arranged:    DME Agency:     HH Arranged:  PT HH Agency:  [facility]  Status of Service:  Completed, signed off  Medicare Important Message Given:    Date Medicare IM Given:    Medicare IM give by:    Date Additional Medicare IM Given:    Additional Medicare Important Message give by:     If discussed at Long Length of Stay Meetings, dates discussed:    Additional Comments: Pt anticipates discharge home over weekend with [facility] through [facility]. [facility] aware of possible DC. Weekend RN to notify [facility] of discharge and fax orders. Pt has no DME needs at this time. No further CM needs.  [name], RN 11/01/2014, 4:02 PM

## 2014-11-01 NOTE — Progress Notes (Signed)
[facility] PROGRESS NOTE  NAKAI POLLIO ION:[id number] DOB: [birth year] DOA: 10/28/2014 PCP: Donzetta Sprung, MD  Assessment/Plan: 1. 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 MRI brain is negative for acute infarct. Was seen by physical therapy recommended home health therapy. Clinically he feels he is improving. Vitamin B 12 levels normal. He does not have any focal deficits. 2. Acute on chronic diastolic congestive heart failure. Continues to have significant volume overload without decline in weight. Will increase Lasix dose and add metolazone. Echocardiogram was recently done in 05/2014 that showed normal ejection fraction. 3. History of atrial flutter. Rate is controlled on Toprol and diltiazem. He is followed by cardiology and felt to be poor candidate for anticoagulation. 4. Thrombocytopenia. Appears to be chronic. 5. Chronic respiratory failure. Continue supplemental oxygen 6. Oxygen dependent/steroid-dependent COPD . Patient feels breathing is near baseline. Continue prednisone and bronchodilators. 7. Aortic stenosis status post TAVR. Follow-up with cardiology   Code Status: Full code Family Communication: Discussed with patient and wife at the bedside Disposition Plan: Discharge home once improved   Consultants:    Procedures:    Antibiotics:    HPI/Subjective: Shortness of breath is similar to yesterday. No new chest pain. Still feels edematous in his legs. Overall he is feeling stronger.  Objective: Filed Vitals:   11/01/14 1357  BP: 138/67  Pulse: 74  Temp: 98.4 F (36.9 C)  Resp: 18    Intake/Output Summary (Last 24 hours) at 11/01/14 1820 Last data filed at 11/01/14 1745  Gross per 24 hour  Intake    840 ml  Output   2275 ml  Net  -1435 ml   Filed Weights   10/29/14 0438 10/31/14 0418 11/01/14 0627  Weight: 127.461 kg (281 lb) 128.141 kg (282 lb 8 oz) 127.189 kg (280 lb 6.4 oz)     Exam:   General:  NAD, laying in bed  Cardiovascular: s1, s2, rrr  Respiratory: crackles at bases  Abdomen: soft, nt, nd, bs+  Musculoskeletal: 2+ edema b/l   Data Reviewed: Basic Metabolic Panel:  Recent Labs Lab 10/28/14 2250 10/30/14 0653 10/31/14 0608 11/01/14 0700  NA 136 139 137 139  K 3.3* 3.4* 3.5 3.7  CL 96* 97* 98* 98*  CO2 33* 33* 29 31  GLUCOSE 198* 128* 143* 127*  BUN 16 16 17 16   CREATININE 0.94 0.91 0.82 0.83  CALCIUM 8.5* 8.4* 8.6* 8.8*   Liver Function Tests:  Recent Labs Lab 10/28/14 2250  AST 73*  ALT 48  ALKPHOS 144*  BILITOT 1.1  PROT 6.6  ALBUMIN 2.7*   No results for input(s): LIPASE, AMYLASE in the last 168 hours. No results for input(s): AMMONIA in the last 168 hours. CBC:  Recent Labs Lab 10/28/14 2250 10/30/14 0653 10/31/14 0608  WBC 6.3 5.7 5.3  NEUTROABS 4.0  --   --   HGB 13.3 12.8* 13.0  HCT 41.5 39.5 39.8  MCV 97.2 97.8 95.9  PLT 88* 83* 98*   Cardiac Enzymes:  Recent Labs Lab 10/28/14 2250  TROPONINI <0.03   BNP (last 3 results)  Recent Labs  10/28/14 2250  BNP 76.0    ProBNP (last 3 results)  Recent Labs  04/08/14 0359  PROBNP 237.7*    CBG:  Recent Labs Lab 11/01/14 0100 11/01/14 0459 11/01/14 0736 11/01/14 1107 11/01/14 1633  GLUCAP 135* 143* 117* 170* 160*    Recent Results (from the past 240 hour(s))  MRSA PCR Screening  Status: Abnormal   Collection Time: 10/29/14 10:05 AM  Result Value Ref Range Status   MRSA by PCR POSITIVE (A) NEGATIVE Final    Comment:        The GeneXpert MRSA Assay (FDA approved for NASAL specimens only), is one component of a comprehensive MRSA colonization surveillance program. It is not intended to diagnose MRSA infection nor to guide or monitor treatment for MRSA infections. RESULT CALLED TO, READ BACK BY AND VERIFIED WITH: CHAPPELLE,R. AT 1322 ON 10/29/2014 BY BAUGHAM,M.      Studies: No results found.  Scheduled Meds: .  aspirin  81 mg Oral Daily  . budesonide  0.25 mg Nebulization BID  . Chlorhexidine Gluconate Cloth  6 each Topical Q0600  . clopidogrel  75 mg Oral Daily  . diltiazem  180 mg Oral Daily  . furosemide  80 mg Intravenous BID  . heparin  5,000 Units Subcutaneous 3 times per day  . insulin aspart  0-20 Units Subcutaneous 6 times per day  . ipratropium-albuterol  3 mL Nebulization Q6H  . metFORMIN  1,000 mg Oral BID WC  . metolazone  5 mg Oral Daily  . metoprolol succinate  25 mg Oral BID  . multivitamin with minerals  1 tablet Oral Daily  . mupirocin ointment  1 application Nasal BID  . potassium chloride  40 mEq Oral Daily  . predniSONE  5 mg Oral Q breakfast  . sodium chloride  3 mL Intravenous Q12H  . zolpidem  5 mg Oral Once   Continuous Infusions:   Principal Problem:   Ataxia Active Problems:   Atrial flutter   CHF (congestive heart failure)   S/P CABG x 5   Morbid obesity   Chronic diastolic congestive heart failure   S/P TAVR (transcatheter aortic valve replacement)   Severe aortic valve stenosis   Thrombocytopenia    Time spent:    [employer]  [facility] Pager [phone]. If 7PM-7AM, please contact night-coverage at [email or web address], password [facility] 11/01/2014, 6:20 PM  LOS: 2 days

## 2014-11-02 LAB — GLUCOSE, CAPILLARY
GLUCOSE-CAPILLARY: 146 mg/dL — AB (ref 65–99)
GLUCOSE-CAPILLARY: 146 mg/dL — AB (ref 65–99)
GLUCOSE-CAPILLARY: 148 mg/dL — AB (ref 65–99)
Glucose-Capillary: 135 mg/dL — ABNORMAL HIGH (ref 65–99)

## 2014-11-02 LAB — BASIC METABOLIC PANEL
ANION GAP: 9 (ref 5–15)
BUN: 17 mg/dL (ref 6–20)
CO2: 32 mmol/L (ref 22–32)
Calcium: 8.8 mg/dL — ABNORMAL LOW (ref 8.9–10.3)
Chloride: 97 mmol/L — ABNORMAL LOW (ref 101–111)
Creatinine, Ser: 0.92 mg/dL (ref 0.61–1.24)
GFR calc Af Amer: >60 mL/min (ref >60)
GFR calc non Af Amer: >60 mL/min (ref >60)
Glucose, Bld: 165 mg/dL — ABNORMAL HIGH (ref 65–99)
POTASSIUM: 3.5 mmol/L (ref 3.5–5.1)
Sodium: 138 mmol/L (ref 135–145)

## 2014-11-02 LAB — CBC
HEMATOCRIT: 41 % (ref 39.0–52.0)
HEMOGLOBIN: 13.1 g/dL (ref 13.0–17.0)
MCH: 31.2 pg (ref 26.0–34.0)
MCHC: 32 g/dL (ref 30.0–36.0)
MCV: 97.6 fL (ref 78.0–100.0)
Platelets: 102 10*3/uL — ABNORMAL LOW (ref 150–400)
RBC: 4.2 MIL/uL — ABNORMAL LOW (ref 4.22–5.81)
RDW: 16.2 % — ABNORMAL HIGH (ref 11.5–15.5)
WBC: 4.4 10*3/uL (ref 4.0–10.5)

## 2014-11-02 NOTE — Progress Notes (Signed)
[facility] PROGRESS NOTE  [name] PIR:[id number] DOB: Feb 05, 1946 DOA: 10/28/2014 PCP: Donzetta Sprung, MD  Assessment/Plan: 1. 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 MRI brain is negative for acute infarct. Was seen by physical therapy recommended home health therapy. Clinically he feels he is improving. Vitamin B 12 levels normal. He does not have any focal deficits. 2. Acute on chronic diastolic congestive heart failure. Continues to have evidence of volume overload, but weight has decreased by 3 lbs since yesterday. Continue intravenous lasix and metolazone. Echocardiogram was recently done in 05/2014 that showed normal ejection fraction. 3. History of atrial flutter. Rate is controlled on Toprol and diltiazem. He is followed by cardiology and felt to be poor candidate for anticoagulation. 4. Thrombocytopenia. Appears to be chronic. 5. Chronic respiratory failure. Continue supplemental oxygen 6. Oxygen dependent/steroid-dependent COPD . Patient feels breathing is near baseline. Continue prednisone and bronchodilators. 7. Aortic stenosis status post TAVR. Follow-up with cardiology   Code Status: Full code Family Communication: Discussed with patient and wife at the bedside Disposition Plan: Discharge home once improved   Consultants:    Procedures:    Antibiotics:    HPI/Subjective: Feeling better today. Reports increased urination overnight. No shortness of breath or chest pain  Objective: Filed Vitals:   11/02/14 1309  BP: 126/54  Pulse: 78  Temp: 98.2 F (36.8 C)  Resp: 18    Intake/Output Summary (Last 24 hours) at 11/02/14 1733 Last data filed at 11/02/14 1400  Gross per 24 hour  Intake    720 ml  Output   2975 ml  Net  -2255 ml   Filed Weights   10/31/14 0418 11/01/14 0627 11/02/14 0500  Weight: 128.141 kg (282 lb 8 oz) 127.189 kg (280 lb 6.4 oz) 125.646 kg (277 lb)    Exam:   General:  NAD,  laying in bed  Cardiovascular: s1, s2, rrr  Respiratory: crackles at bases  Abdomen: soft, nt, nd, bs+  Musculoskeletal: 2+ edema b/l   Data Reviewed: Basic Metabolic Panel:  Recent Labs Lab 10/28/14 2250 10/30/14 0653 10/31/14 0608 11/01/14 0700 11/02/14 0638  NA 136 139 137 139 138  K 3.3* 3.4* 3.5 3.7 3.5  CL 96* 97* 98* 98* 97*  CO2 33* 33* 29 31 32  GLUCOSE 198* 128* 143* 127* 165*  BUN 16 16 17 16 17   CREATININE 0.94 0.91 0.82 0.83 0.92  CALCIUM 8.5* 8.4* 8.6* 8.8* 8.8*   Liver Function Tests:  Recent Labs Lab 10/28/14 2250  AST 73*  ALT 48  ALKPHOS 144*  BILITOT 1.1  PROT 6.6  ALBUMIN 2.7*   No results for input(s): LIPASE, AMYLASE in the last 168 hours. No results for input(s): AMMONIA in the last 168 hours. CBC:  Recent Labs Lab 10/28/14 2250 10/30/14 0653 10/31/14 0608 11/02/14 0638  WBC 6.3 5.7 5.3 4.4  NEUTROABS 4.0  --   --   --   HGB 13.3 12.8* 13.0 13.1  HCT 41.5 39.5 39.8 41.0  MCV 97.2 97.8 95.9 97.6  PLT 88* 83* 98* 102*   Cardiac Enzymes:  Recent Labs Lab 10/28/14 2250  TROPONINI <0.03   BNP (last 3 results)  Recent Labs  10/28/14 2250  BNP 76.0    ProBNP (last 3 results)  Recent Labs  04/08/14 0359  PROBNP 237.7*    CBG:  Recent Labs Lab 11/01/14 1633 11/01/14 2127 11/02/14 0736 11/02/14 1155 11/02/14 1648  GLUCAP 160* 112* 146* 135* 146*  Recent Results (from the past 240 hour(s))  MRSA PCR Screening     Status: Abnormal   Collection Time: 10/29/14 10:05 AM  Result Value Ref Range Status   MRSA by PCR POSITIVE (A) NEGATIVE Final    Comment:        The GeneXpert MRSA Assay (FDA approved for NASAL specimens only), is one component of a comprehensive MRSA colonization surveillance program. It is not intended to diagnose MRSA infection nor to guide or monitor treatment for MRSA infections. RESULT CALLED TO, READ BACK BY AND VERIFIED WITH: CHAPPELLE,R. AT 1322 ON 10/29/2014 BY BAUGHAM,M.       Studies: No results found.  Scheduled Meds: . aspirin  81 mg Oral Daily  . budesonide  0.25 mg Nebulization BID  . Chlorhexidine Gluconate Cloth  6 each Topical Q0600  . clopidogrel  75 mg Oral Daily  . diltiazem  180 mg Oral Daily  . furosemide  80 mg Intravenous BID  . heparin  5,000 Units Subcutaneous 3 times per day  . insulin aspart  0-20 Units Subcutaneous TID WC  . insulin aspart  0-5 Units Subcutaneous QHS  . ipratropium-albuterol  3 mL Nebulization Q6H  . metFORMIN  1,000 mg Oral BID WC  . metolazone  5 mg Oral Daily  . metoprolol succinate  25 mg Oral BID  . multivitamin with minerals  1 tablet Oral Daily  . mupirocin ointment  1 application Nasal BID  . potassium chloride  40 mEq Oral Daily  . predniSONE  5 mg Oral Q breakfast  . sodium chloride  3 mL Intravenous Q12H  . zolpidem  5 mg Oral Once   Continuous Infusions:   Principal Problem:   Ataxia Active Problems:   Atrial flutter   CHF (congestive heart failure)   S/P CABG x 5   Morbid obesity   Chronic diastolic congestive heart failure   S/P TAVR (transcatheter aortic valve replacement)   Severe aortic valve stenosis   Thrombocytopenia    Time spent:    [name]  [facility] Pager [phone]. If 7PM-7AM, please contact night-coverage at [email or web address], password [facility] 11/02/2014, 5:33 PM  LOS: 3 days

## 2014-11-03 DIAGNOSIS — J9610 Chronic respiratory failure, unspecified whether with hypoxia or hypercapnia: Secondary | ICD-10-CM | POA: Diagnosis present

## 2014-11-03 DIAGNOSIS — J9611 Chronic respiratory failure with hypoxia: Secondary | ICD-10-CM

## 2014-11-03 LAB — BASIC METABOLIC PANEL
Anion gap: 11 (ref 5–15)
BUN: 19 mg/dL (ref 6–20)
CALCIUM: 9 mg/dL (ref 8.9–10.3)
CO2: 32 mmol/L (ref 22–32)
Chloride: 94 mmol/L — ABNORMAL LOW (ref 101–111)
Creatinine, Ser: 1.02 mg/dL (ref 0.61–1.24)
GFR calc Af Amer: >60 mL/min (ref >60)
Glucose, Bld: 152 mg/dL — ABNORMAL HIGH (ref 65–99)
Potassium: 3.4 mmol/L — ABNORMAL LOW (ref 3.5–5.1)
SODIUM: 137 mmol/L (ref 135–145)

## 2014-11-03 LAB — GLUCOSE, CAPILLARY
GLUCOSE-CAPILLARY: 153 mg/dL — AB (ref 65–99)
Glucose-Capillary: 138 mg/dL — ABNORMAL HIGH (ref 65–99)
Glucose-Capillary: 169 mg/dL — ABNORMAL HIGH (ref 65–99)
Glucose-Capillary: 137 mg/dL — ABNORMAL HIGH (ref 65–99)

## 2014-11-03 MED ORDER — POTASSIUM CHLORIDE 20 MEQ PO PACK
40.0000 meq | PACK | Freq: Every day | ORAL | Status: DC
  Administered 2014-11-04: 40 meq via ORAL
  Filled 2014-11-03: qty 2

## 2014-11-03 MED ORDER — POTASSIUM CHLORIDE 20 MEQ PO PACK
40.0000 meq | PACK | Freq: Once | ORAL | Status: AC
  Administered 2014-11-03: 40 meq via ORAL
  Filled 2014-11-03: qty 2

## 2014-11-03 NOTE — Progress Notes (Signed)
[facility] PROGRESS NOTE  [name] AXK:[id number] DOB: 11-May-1945 DOA: 10/28/2014 PCP: Donzetta Sprung, MD  Assessment/Plan: 1. 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 MRI brain is negative for acute infarct. Was seen by physical therapy recommended home health therapy. Clinically he feels he is improving. Vitamin B 12 levels normal. He does not have any focal deficits. 2. Acute on chronic diastolic congestive heart failure. Continues to have evidence of volume overload, but weight has decreased by 10lbs since admission. Continue intravenous lasix and metolazone. Echocardiogram was recently done in 05/2014 that showed normal ejection fraction. 3. History of atrial flutter. Rate is controlled on Toprol and diltiazem. He is followed by cardiology and felt to be poor candidate for anticoagulation. 4. Thrombocytopenia. Appears to be chronic. 5. Chronic respiratory failure. Continue supplemental oxygen 6. Oxygen dependent/steroid-dependent COPD . Patient feels breathing is near baseline. Continue prednisone and bronchodilators. 7. Aortic stenosis status post TAVR. Follow-up with cardiology   Code Status: Full code Family Communication: Discussed with patient and wife at the bedside Disposition Plan: Discharge home once improved   Consultants:    Procedures:    Antibiotics:    HPI/Subjective: Had some shortness of breath overnight. Improved with applying oxygen. No cough or chest pain.  Objective: Filed Vitals:   11/03/14 0629  BP: 130/84  Pulse: 96  Temp: 97.6 F (36.4 C)  Resp: 18    Intake/Output Summary (Last 24 hours) at 11/03/14 1033 Last data filed at 11/03/14 1000  Gross per 24 hour  Intake    720 ml  Output   2650 ml  Net  -1930 ml   Filed Weights   11/01/14 0627 11/02/14 0500 11/03/14 0629  Weight: 127.189 kg (280 lb 6.4 oz) 125.646 kg (277 lb) 122.879 kg (270 lb 14.4 oz)    Exam:   General:  NAD,  laying in bed  Cardiovascular: s1, s2, rrr  Respiratory: crackles at bases  Abdomen: soft, nt, nd, bs+  Musculoskeletal: 2+ edema b/l   Data Reviewed: Basic Metabolic Panel:  Recent Labs Lab 10/30/14 0653 10/31/14 0608 11/01/14 0700 11/02/14 0638 11/03/14 0646  NA 139 137 139 138 137  K 3.4* 3.5 3.7 3.5 3.4*  CL 97* 98* 98* 97* 94*  CO2 33* 29 31 32 32  GLUCOSE 128* 143* 127* 165* 152*  BUN 16 17 16 17 19   CREATININE 0.91 0.82 0.83 0.92 1.02  CALCIUM 8.4* 8.6* 8.8* 8.8* 9.0   Liver Function Tests:  Recent Labs Lab 10/28/14 2250  AST 73*  ALT 48  ALKPHOS 144*  BILITOT 1.1  PROT 6.6  ALBUMIN 2.7*   No results for input(s): LIPASE, AMYLASE in the last 168 hours. No results for input(s): AMMONIA in the last 168 hours. CBC:  Recent Labs Lab 10/28/14 2250 10/30/14 0653 10/31/14 0608 11/02/14 0638  WBC 6.3 5.7 5.3 4.4  NEUTROABS 4.0  --   --   --   HGB 13.3 12.8* 13.0 13.1  HCT 41.5 39.5 39.8 41.0  MCV 97.2 97.8 95.9 97.6  PLT 88* 83* 98* 102*   Cardiac Enzymes:  Recent Labs Lab 10/28/14 2250  TROPONINI <0.03   BNP (last 3 results)  Recent Labs  10/28/14 2250  BNP 76.0    ProBNP (last 3 results)  Recent Labs  04/08/14 0359  PROBNP 237.7*    CBG:  Recent Labs Lab 11/02/14 0736 11/02/14 1155 11/02/14 1648 11/02/14 2138 11/03/14 0741  GLUCAP 146* 135* 146* 148* 138*  Recent Results (from the past 240 hour(s))  MRSA PCR Screening     Status: Abnormal   Collection Time: 10/29/14 10:05 AM  Result Value Ref Range Status   MRSA by PCR POSITIVE (A) NEGATIVE Final    Comment:        The GeneXpert MRSA Assay (FDA approved for NASAL specimens only), is one component of a comprehensive MRSA colonization surveillance program. It is not intended to diagnose MRSA infection nor to guide or monitor treatment for MRSA infections. RESULT CALLED TO, READ BACK BY AND VERIFIED WITH: CHAPPELLE,R. AT 1322 ON 10/29/2014 BY BAUGHAM,M.       Studies: No results found.  Scheduled Meds: . aspirin  81 mg Oral Daily  . budesonide  0.25 mg Nebulization BID  . clopidogrel  75 mg Oral Daily  . diltiazem  180 mg Oral Daily  . furosemide  80 mg Intravenous BID  . heparin  5,000 Units Subcutaneous 3 times per day  . insulin aspart  0-20 Units Subcutaneous TID WC  . insulin aspart  0-5 Units Subcutaneous QHS  . ipratropium-albuterol  3 mL Nebulization Q6H  . metFORMIN  1,000 mg Oral BID WC  . metolazone  5 mg Oral Daily  . metoprolol succinate  25 mg Oral BID  . multivitamin with minerals  1 tablet Oral Daily  . potassium chloride  40 mEq Oral Once  . [START ON 11/04/2014] potassium chloride  40 mEq Oral Daily  . predniSONE  5 mg Oral Q breakfast  . sodium chloride  3 mL Intravenous Q12H  . zolpidem  5 mg Oral Once   Continuous Infusions:   Principal Problem:   Ataxia Active Problems:   Atrial flutter   CHF (congestive heart failure)   S/P CABG x 5   Morbid obesity   Chronic diastolic congestive heart failure   S/P TAVR (transcatheter aortic valve replacement)   Severe aortic valve stenosis   Thrombocytopenia    Time spent:    [name]  [facility] Pager [phone]. If 7PM-7AM, please contact night-coverage at [email or web address], password [facility] 11/03/2014, 10:33 AM  LOS: 4 days

## 2014-11-04 DIAGNOSIS — J449 Chronic obstructive pulmonary disease, unspecified: Secondary | ICD-10-CM

## 2014-11-04 LAB — BASIC METABOLIC PANEL
Anion gap: 9 (ref 5–15)
BUN: 19 mg/dL (ref 6–20)
CALCIUM: 9.2 mg/dL (ref 8.9–10.3)
CO2: 34 mmol/L — ABNORMAL HIGH (ref 22–32)
CREATININE: 1.05 mg/dL (ref 0.61–1.24)
Chloride: 92 mmol/L — ABNORMAL LOW (ref 101–111)
Glucose, Bld: 174 mg/dL — ABNORMAL HIGH (ref 65–99)
POTASSIUM: 3.6 mmol/L (ref 3.5–5.1)
SODIUM: 135 mmol/L (ref 135–145)

## 2014-11-04 LAB — GLUCOSE, CAPILLARY
GLUCOSE-CAPILLARY: 172 mg/dL — AB (ref 65–99)
Glucose-Capillary: 174 mg/dL — ABNORMAL HIGH (ref 65–99)

## 2014-11-04 MED ORDER — INSULIN NPH ISOPHANE & REGULAR (70-30) 100 UNIT/ML ~~LOC~~ SUSP: 15.0000 [IU] | Freq: Two times a day (BID) | SUBCUTANEOUS | Status: AC

## 2014-11-04 MED ORDER — METOLAZONE 5 MG PO TABS: 5.0000 mg | ORAL_TABLET | ORAL | Status: DC

## 2014-11-04 MED ORDER — TORSEMIDE 20 MG PO TABS: ORAL_TABLET | ORAL | Status: DC

## 2014-11-04 MED ORDER — DILTIAZEM HCL ER COATED BEADS 180 MG PO CP24: 180.0000 mg | ORAL_CAPSULE | Freq: Every day | ORAL | Status: DC

## 2014-11-04 MED ORDER — POTASSIUM CHLORIDE 20 MEQ PO PACK: 40.0000 meq | PACK | Freq: Every day | ORAL | Status: DC

## 2014-11-04 NOTE — Progress Notes (Signed)
Physical Therapy Treatment Patient Details Name: [name] MRN: [id number] DOB: [birth year] Today's Date: 11/04/2014    History of Present Illness [name] is an 69 y.o. male with hx of RA on chronic steroid, COPD, severe aortic stenosis, s/p Particia Lather TAVR, previously on Xaretlto taken off due to GI bleed, currently on DUAT, Hx of Afutter/afib, HTN, s/p CABGx5, asthma, DM, HTN, low back pain, presented to the ER after another fall in the bathroom. He had recently noted to have more trouble walking. He felt imbalance. He denied chest pain, SOB, fever or chills. He had hx of CHF, and noted to have increased bilateral pedal edema. Evaluation in the ER showed afib with RVR, and BP was borderline low. He was given 500 cc IVF bolus, and subsequently given IV cardiazem 68m. His BP went up to 1182systolic, and his HR showed to 100. His CT of the head was negative. His Cr was 0.94 and K was 3.3. Hospitalist was asked to admit him for afib with RVR, CHF, hypotension and to exclude a posterior CVA.     PT Comments    Patient found sitting at edge of bed with family in room, both very pleasant and willing to participate in PT today. Initiated session with exercises sitting at edge of bed then progressed to standing exercises with walker. Patient limited by back and leg pain with exercise today. Performed gait approximately 465ft with walker today, min guard and cues for safe use of device throughout. Patient remained on 2LPM O2 throughout entire session today with no signs of shortness of breath or distress throughout session; overall patient appeared to tolerate session well. Patient left sitting edge of bed with all needs met and family in room, call bell in reach.    Follow Up Recommendations  Home health PT     Equipment Recommendations  None recommended by PT    Recommendations for Other Services       Precautions / Restrictions Precautions Precautions:  Fall Restrictions Weight Bearing Restrictions: No    Mobility  Bed Mobility Overal bed mobility: Independent Bed Mobility: Supine to Sit     Supine to sit: Supervision        Transfers Overall transfer level: Needs assistance Equipment used: Standard walker Transfers: Sit to/from Stand Sit to Stand: Supervision            Ambulation/Gait Ambulation/Gait assistance: Min guard Ambulation Distance (Feet): 45 Feet Assistive device: Standard walker Gait Pattern/deviations: Decreased step length - right;Decreased step length - left;Decreased stance time - left;Trunk flexed Gait velocity: appropriate for the situation       Stairs            Wheelchair Mobility    Modified Rankin (Stroke Patients Only)       Balance Overall balance assessment: Needs assistance   Sitting balance-Leahy Scale: Good     Standing balance support: Bilateral upper extremity supported Standing balance-Leahy Scale: Fair                      Cognition Arousal/Alertness: Awake/alert Behavior During Therapy: WFL for tasks assessed/performed Overall Cognitive Status: Within Functional Limits for tasks assessed                      Exercises General Exercises - Lower Extremity Ankle Circles/Pumps: Standing;Both;10 reps Long Arc Quad: Seated;Both;10 reps Hip ABduction/ADduction: Seated;Both;10 reps Hip Flexion/Marching: Seated;Standing;10 reps    General Comments  Pertinent Vitals/Pain Pain Assessment: 0-10 Pain Score: 7  Pain Location: back and L LE  Pain Descriptors / Indicators: Burning Pain Intervention(s): Monitored during session    Home Living                      Prior Function            PT Goals (current goals can now be found in the care plan section) Acute Rehab PT Goals Patient Stated Goal: wants to regain strength and ability to walk at home PT Goal Formulation: With patient Time For Goal Achievement:  11/12/14 Potential to Achieve Goals: Fair Progress towards PT goals: Progressing toward goals    Frequency  Min 3X/week    PT Plan Current plan remains appropriate    Co-evaluation             End of Session Equipment Utilized During Treatment: Gait belt Activity Tolerance: Patient tolerated treatment well;Patient limited by fatigue Patient left: in bed;with call bell/phone within reach;with family/visitor present     Time: 0149-9692 PT Time Calculation (min) (ACUTE ONLY): 23 min  Charges:  $Gait Training: 8-22 mins $Therapeutic Exercise: 8-22 mins                    G Codes:      Deniece Ree PT, DPT [phone]

## 2014-11-04 NOTE — Care Management Note (Signed)
Case Management Note  Patient Details  Name: [name] MRN: [id number] Date of Birth: [birth year]  Expected Discharge Date:                  Expected Discharge Plan:  Home w Home Health Services  In-House Referral:  NA  Discharge planning Services  CM Consult  Post Acute Care Choice:  Home Health Choice offered to:  Spouse  DME Arranged:    DME Agency:     HH Arranged:  PT HH Agency:  [facility]  Status of Service:  Completed, signed off  Medicare Important Message Given:  Yes-third notification given Date Medicare IM Given:    Medicare IM give by:    Date Additional Medicare IM Given:    Additional Medicare Important Message give by:     If discussed at Long Length of Stay Meetings, dates discussed:    Additional Comments: Patient discharging home today with HH. [facility] has been made aware of DC and patient aware HH has 48 hours to make first visit.  [name], RN 11/04/2014, 2:09 PM

## 2014-11-04 NOTE — Progress Notes (Signed)
[name] discharged home with wife per MD order.  Discharge instructions reviewed and discussed with the patient and wife at bedside, all questions and concerns answered. Copy of instructions and scripts given to patient.    Medication List    STOP taking these medications        diltiazem 60 MG tablet  Commonly known as:  CARDIZEM     potassium chloride SA 20 MEQ tablet  Commonly known as:  KLOR-CON M20      TAKE these medications        ascorbic acid 500 MG tablet  Commonly known as:  VITAMIN C  Take 500 mg by mouth daily.     aspirin 81 MG tablet  Take 1 tablet (81 mg total) by mouth daily.     bisacodyl 5 MG EC tablet  Commonly known as:  DULCOLAX  Take 2 tablets (10 mg total) by mouth daily as needed for moderate constipation (Give daily if no BM).     budesonide 0.25 MG/2ML nebulizer solution  Commonly known as:  PULMICORT  Take 0.25 mg by nebulization 2 (two) times daily.     BUTRANS 10 MCG/HR Ptwk patch  Generic drug:  buprenorphine  Place 10 mcg onto the skin once a week.     cetirizine 10 MG tablet  Commonly known as:  ZYRTEC  Take 10 mg by mouth at bedtime.     clopidogrel 75 MG tablet  Commonly known as:  PLAVIX  Take 1 tablet (75 mg total) by mouth daily.     co-enzyme Q-10 30 MG capsule  Take 30 mg by mouth at bedtime.     ipratropium-albuterol 0.5-2.5 (3) MG/3ML Soln  Commonly known as:  DUONEB  Take 3 mLs by nebulization every 6 (six) hours as needed (for shortness of breath).     COMBIVENT 18-103 MCG/ACT inhaler  Generic drug:  albuterol-ipratropium  Inhale 2 puffs into the lungs every 6 (six) hours as needed for wheezing or shortness of breath.     desonide 0.05 % cream  Commonly known as:  DESOWEN  Apply 1 application topically 2 (two) times daily as needed (for rash).     diltiazem 180 MG 24 hr capsule  Commonly known as:  CARDIZEM CD  Take 1 capsule (180 mg total) by mouth daily.     ezetimibe 10 MG tablet  Commonly known  as:  ZETIA  Take 10 mg by mouth at bedtime.     formoterol 20 MCG/2ML nebulizer solution  Commonly known as:  PERFOROMIST  Take 2 mLs (20 mcg total) by nebulization 2 (two) times daily. Use in nebulizer twice daily perfectly regularly     insulin NPH-regular Human (70-30) 100 UNIT/ML injection  Commonly known as:  NOVOLIN 70/30  Inject 15 Units into the skin 2 (two) times daily.     ketoconazole 2 % cream  Commonly known as:  NIZORAL  Apply 1 application topically 2 (two) times daily as needed for irritation.     loratadine 10 MG tablet  Commonly known as:  CLARITIN  Take 10 mg by mouth every morning.     metFORMIN 1000 MG tablet  Commonly known as:  GLUCOPHAGE  Take 1 tablet (1,000 mg total) by mouth 2 (two) times daily. Hold for 48 hours, restart on 02/10/2014.     metolazone 5 MG tablet  Commonly known as:  ZAROXOLYN  Take 1 tablet (5 mg total) by mouth every other day.     metoprolol succinate  25 MG 24 hr tablet  Commonly known as:  TOPROL-XL  Take 1 tablet (25 mg total) by mouth 2 (two) times daily.     multivitamin tablet  Take 1 tablet by mouth daily.     oxyCODONE-acetaminophen 5-325 MG per tablet  Commonly known as:  PERCOCET/ROXICET  Take 1-2 tablets by mouth every 4 (four) hours as needed for moderate pain.     polyethylene glycol packet  Commonly known as:  MIRALAX / GLYCOLAX  Take 17 g by mouth daily as needed for moderate constipation.     potassium chloride 20 MEQ packet  Commonly known as:  KLOR-CON  Take 40 mEq by mouth daily.     predniSONE 5 MG tablet  Commonly known as:  DELTASONE  Take 5 mg by mouth daily with breakfast.     torsemide 20 MG tablet  Commonly known as:  DEMADEX  Take 60mg  in the morning and 40mg  in the evening. may take extra as needed for swelling       \ IV site discontinued and catheter remains intact. Site without signs and symptoms of complications. Dressing and pressure applied.  Patient escorted to car by , NT  in a wheelchair,  no distress noted upon discharge.  11/04/2014 3:27 PM

## 2014-11-04 NOTE — Discharge Summary (Signed)
Physician Discharge Summary  [name] ZOX:[id number] DOB: Jun 24, 1945 DOA: 10/28/2014  PCP: Donzetta Sprung, MD  Admit date: 10/28/2014 Discharge date: 11/04/2014  Time spent: 40 minutes  Recommendations for Outpatient Follow-up:  1. Patient will follow up with his primary cardiologist in 1-2 weeks 2. Follow up with primary care physician in 2 weeks  Discharge Diagnoses:  Principal Problem:   Acute on chronic diastolic heart failure Active Problems:   Atrial flutter   Aortic stenosis   COPD GOLD III with reversibility    CHF (congestive heart failure)   S/P CABG x 5   Morbid obesity   S/P TAVR (transcatheter aortic valve replacement)   Severe aortic valve stenosis   Ataxia   Thrombocytopenia   Chronic respiratory failure   Discharge Condition: improving  Diet recommendation: low salt, low carb  Filed Weights   11/02/14 0500 11/03/14 0629 11/04/14 0736  Weight: 125.646 kg (277 lb) 122.879 kg (270 lb 14.4 oz) 120.974 kg (266 lb 11.2 oz)    History of present illness:  This is a 69 year old gentleman who presented to the emergency room with complaints of recurrent falls. He was having difficulty walking and felt imbalanced. He was noted to have increasing bilateral pedal edema. Evaluation the emergency room showed atrial fibrillation with rapid rate. He was admitted for further treatments.  Hospital Course:  1. 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 MRI brain was negative for acute infarct. Was seen by physical therapy recommended home health therapy. Clinically he feels he is improving. Vitamin B 12 levels normal. He does not have any focal deficits. He does have chronic lower back pain with sciatic pain on his left lower extremity. This could be contributing. Follow-up with primary care physician. 2. Acute on chronic diastolic congestive heart failure. Patient was started on intravenous Lasix as well as metolazone. He diuresed  approximately 14 pounds. He still has evidence of significant volume overload. His unwillingness in the hospital for further diuresis. He wishes to go home and continue on oral diuretics. He will transition to torsemide and metolazone. He plans to follow-up with his primary cardiologist. He was instructed on the importance of fluid restriction.. 3. History of atrial flutter. Rate is controlled on Toprol and diltiazem. He is followed by cardiology and felt to be poor candidate for anticoagulation. 4. Thrombocytopenia. Appears to be chronic. 5. Chronic respiratory failure. Continue supplemental oxygen 6. Oxygen dependent/steroid-dependent COPD . Patient feels breathing is near baseline. Continue prednisone and bronchodilators. 7. Aortic stenosis status post TAVR. Follow-up with cardiology  Procedures:    Consultations:    Discharge Exam: Filed Vitals:   11/04/14 1342  BP: 132/64  Pulse: 77  Temp: 98.5 F (36.9 C)  Resp: 18    General: NAD Cardiovascular: S1, S2 RRR Respiratory: crackles at bases  Discharge Instructions   Discharge Instructions    (HEART FAILURE PATIENTS) Call MD:  Anytime you have any of the following symptoms: 1) 3 pound weight gain in 24 hours or 5 pounds in 1 week 2) shortness of breath, with or without a dry hacking cough 3) swelling in the hands, feet or stomach 4) if you have to sleep on extra pillows at night in order to breathe.    Complete by:  As directed      Diet - low sodium heart healthy    Complete by:  As directed      Diet Carb Modified    Complete by:  As directed  Face-to-face encounter (required for Medicare/Medicaid patients)    Complete by:  As directed   I MEMON,JEHANZEB certify that this patient is under my care and that I, or a nurse practitioner or physician's assistant working with me, had a face-to-face encounter that meets the physician face-to-face encounter requirements with this patient on 11/04/2014. The encounter with the  patient was in whole, or in part for the following medical condition(s) which is the primary reason for home health care (List medical condition): chf exacerbation  The encounter with the patient was in whole, or in part, for the following medical condition, which is the primary reason for home health care:  chf exacerbation  I certify that, based on my findings, the following services are medically necessary home health services:   Nursing Physical therapy    Reason for Medically Necessary Home Health Services:  Skilled Nursing- Skilled Assessment/Observation  My clinical findings support the need for the above services:  Shortness of breath with activity  Further, I certify that my clinical findings support that this patient is homebound due to:  Shortness of Breath with activity     Home Health    Complete by:  As directed   To provide the following care/treatments:   RN PT       Increase activity slowly    Complete by:  As directed           Discharge Medication List as of 11/04/2014  3:06 PM    START taking these medications   Details  diltiazem (CARDIZEM CD) 180 MG 24 hr capsule Take 1 capsule (180 mg total) by mouth daily., Starting 11/04/2014, Until Discontinued, Print    metolazone (ZAROXOLYN) 5 MG tablet Take 1 tablet (5 mg total) by mouth every other day., Starting 11/04/2014, Until Discontinued, Print    potassium chloride (KLOR-CON) 20 MEQ packet Take 40 mEq by mouth daily., Starting 11/04/2014, Until Discontinued, Print      CONTINUE these medications which have CHANGED   Details  insulin NPH-regular Human (NOVOLIN 70/30) (70-30) 100 UNIT/ML injection Inject 15 Units into the skin 2 (two) times daily., Starting 11/04/2014, Until Discontinued, No Print    torsemide (DEMADEX) 20 MG tablet Take 60mg  in the morning and 40mg  in the evening. may take extra as needed for swelling, Print      CONTINUE these medications which have NOT CHANGED   Details  albuterol-ipratropium  (COMBIVENT) 18-103 MCG/ACT inhaler Inhale 2 puffs into the lungs every 6 (six) hours as needed for wheezing or shortness of breath. , Until Discontinued, Historical Med    ascorbic acid (VITAMIN C) 500 MG tablet Take 500 mg by mouth daily. , Until Discontinued, Historical Med    aspirin 81 MG tablet Take 1 tablet (81 mg total) by mouth daily., Starting 02/07/2014, Until Discontinued, OTC    bisacodyl (DULCOLAX) 5 MG EC tablet Take 2 tablets (10 mg total) by mouth daily as needed for moderate constipation (Give daily if no BM)., Starting 04/09/2014, Until Discontinued, OTC    budesonide (PULMICORT) 0.25 MG/2ML nebulizer solution Take 0.25 mg by nebulization 2 (two) times daily., Until Discontinued, Historical Med    buprenorphine (BUTRANS) 10 MCG/HR PTWK patch Place 10 mcg onto the skin once a week., Until Discontinued, Historical Med    cetirizine (ZYRTEC) 10 MG tablet Take 10 mg by mouth at bedtime., Until Discontinued, Historical Med    clopidogrel (PLAVIX) 75 MG tablet Take 1 tablet (75 mg total) by mouth daily., Starting 04/11/2014, Until Discontinued, Normal  co-enzyme Q-10 30 MG capsule Take 30 mg by mouth at bedtime. , Until Discontinued, Historical Med    desonide (DESOWEN) 0.05 % cream Apply 1 application topically 2 (two) times daily as needed (for rash). , Until Discontinued, Historical Med    ezetimibe (ZETIA) 10 MG tablet Take 10 mg by mouth at bedtime. , Until Discontinued, Historical Med    formoterol (PERFOROMIST) 20 MCG/2ML nebulizer solution Take 2 mLs (20 mcg total) by nebulization 2 (two) times daily. Use in nebulizer twice daily perfectly regularly, Starting 03/21/2013, Until Discontinued, Print    ipratropium-albuterol (DUONEB) 0.5-2.5 (3) MG/3ML SOLN Take 3 mLs by nebulization every 6 (six) hours as needed (for shortness of breath)., Until Discontinued, Historical Med    ketoconazole (NIZORAL) 2 % cream Apply 1 application topically 2 (two) times daily as needed for  irritation. , Until Discontinued, Historical Med    loratadine (CLARITIN) 10 MG tablet Take 10 mg by mouth every morning. , Until Discontinued, Historical Med    metFORMIN (GLUCOPHAGE) 1000 MG tablet Take 1 tablet (1,000 mg total) by mouth 2 (two) times daily. Hold for 48 hours, restart on 02/10/2014., Starting 02/07/2014, Until Discontinued, OTC    metoprolol succinate (TOPROL-XL) 25 MG 24 hr tablet Take 1 tablet (25 mg total) by mouth 2 (two) times daily., Starting 08/30/2014, Until Discontinued, Normal    Multiple Vitamin (MULTIVITAMIN) tablet Take 1 tablet by mouth daily.  , Until Discontinued, Historical Med    oxyCODONE-acetaminophen (PERCOCET/ROXICET) 5-325 MG per tablet Take 1-2 tablets by mouth every 4 (four) hours as needed for moderate pain., Starting 03/12/2014, Until Discontinued, Print    polyethylene glycol (MIRALAX / GLYCOLAX) packet Take 17 g by mouth daily as needed for moderate constipation., Starting 04/09/2014, Until Discontinued, OTC    predniSONE (DELTASONE) 5 MG tablet Take 5 mg by mouth daily with breakfast., Until Discontinued, Historical Med      STOP taking these medications     diltiazem (CARDIZEM) 60 MG tablet      potassium chloride SA (KLOR-CON M20) 20 MEQ tablet        No Known Allergies Follow-up Information    Follow up with [facility].   Contact information:   [address] [place] [address] [phone]        The results of significant diagnostics from this hospitalization (including imaging, microbiology, ancillary and laboratory) are listed below for reference.    Significant Diagnostic Studies: Dg Chest 2 View  10/29/2014   CLINICAL DATA:  Dyspnea and weakness, recent worsening. Fell twice last night.  EXAM: CHEST  2 VIEW  COMPARISON:  04/10/2014  FINDINGS: There is prior sternotomy with CABG and aortic valvuloplasty. Heart size is normal. The lungs are clear except for mild curvilinear basilar scarring.  There is no pleural effusion. The pulmonary vasculature is normal. Hilar and mediastinal contours are unremarkable.  IMPRESSION: No acute cardiopulmonary findings.   Electronically Signed   By: Ellery Plunk M.D.   On: 10/29/2014 00:14   Ct Head Wo Contrast  10/29/2014   CLINICAL DATA:  Frequent falls, fell twice last night. Hit head on chair. Weakness.  EXAM: CT HEAD WITHOUT CONTRAST  TECHNIQUE: Contiguous axial images were obtained from the base of the skull through the vertex without intravenous contrast.  COMPARISON:  None.  FINDINGS: No intracranial hemorrhage, mass effect, or midline shift. No hydrocephalus. The basilar cisterns are patent. No evidence of territorial infarct. No intracranial fluid collection. Calvarium is intact. Included paranasal sinuses and  mastoid air cells are well aerated.  IMPRESSION: No acute intracranial abnormality.   Electronically Signed   By: Rubye Oaks M.D.   On: 10/29/2014 00:46   Mr Brain Wo Contrast  10/29/2014   CLINICAL DATA:  Ataxia.  Multiple falls.  EXAM: MRI HEAD WITHOUT CONTRAST  TECHNIQUE: Multiplanar, multiecho pulse sequences of the brain and surrounding structures were obtained without intravenous contrast.  COMPARISON:  CT head without contrast 10/28/2014.  FINDINGS: The diffusion weighted images demonstrate no evidence for acute or subacute infarction. No acute hemorrhage or mass lesion is present. Mild generalized atrophy is evident. There is no significant white matter disease. Minimal periventricular changes are within normal limits for age.  Cerebellar atrophy is proportionate to this supratentorial atrophy.  Flow is present in the major intracranial arteries. The globes and orbits are intact. Minimal fluid or mucosal thickening is present posteriorly in the left maxillary sinus. The remaining paranasal sinuses and the mastoid air cells are clear.  Skullbase is within normal limits.  Midline structures are normal.  IMPRESSION: 1. Mild  generalized cerebral and cerebellar atrophy without acute or focal abnormality to explain the patient's symptoms. 2. Minimal left maxillary sinus disease.   Electronically Signed   By: Marin Roberts M.D.   On: 10/29/2014 20:06   Dg Hips Bilat With Pelvis Min 5 Views  10/29/2014   CLINICAL DATA:  Bilateral hip pain.  Fell twice last night.  EXAM: BILATERAL HIP (WITH PELVIS) 5-6 VIEWS  COMPARISON:  None.  FINDINGS: Negative for acute fracture or dislocation about either hip. There are calcifications adjacent to the left greater trochanter which may represent sequelae from trochanteric bursitis. There is no bone lesion or bony destruction. No significant arthritic changes are evident.  IMPRESSION: Negative for acute fracture or dislocation.   Electronically Signed   By: Ellery Plunk M.D.   On: 10/29/2014 01:17    Microbiology: Recent Results (from the past 240 hour(s))  MRSA PCR Screening     Status: Abnormal   Collection Time: 10/29/14 10:05 AM  Result Value Ref Range Status   MRSA by PCR POSITIVE (A) NEGATIVE Final    Comment:        The GeneXpert MRSA Assay (FDA approved for NASAL specimens only), is one component of a comprehensive MRSA colonization surveillance program. It is not intended to diagnose MRSA infection nor to guide or monitor treatment for MRSA infections. RESULT CALLED TO, READ BACK BY AND VERIFIED WITH: CHAPPELLE,R. AT 1322 ON 10/29/2014 BY BAUGHAM,M.      Labs: Basic Metabolic Panel:  Recent Labs Lab 10/31/14 0608 11/01/14 0700 11/02/14 0638 11/03/14 0646 11/04/14 0635  NA 137 139 138 137 135  K 3.5 3.7 3.5 3.4* 3.6  CL 98* 98* 97* 94* 92*  CO2 29 31 32 32 34*  GLUCOSE 143* 127* 165* 152* 174*  BUN 17 16 17 19 19   CREATININE 0.82 0.83 0.92 1.02 1.05  CALCIUM 8.6* 8.8* 8.8* 9.0 9.2   Liver Function Tests:  Recent Labs Lab 10/28/14 2250  AST 73*  ALT 48  ALKPHOS 144*  BILITOT 1.1  PROT 6.6  ALBUMIN 2.7*   No results for input(s):  LIPASE, AMYLASE in the last 168 hours. No results for input(s): AMMONIA in the last 168 hours. CBC:  Recent Labs Lab 10/28/14 2250 10/30/14 0653 10/31/14 0608 11/02/14 0638  WBC 6.3 5.7 5.3 4.4  NEUTROABS 4.0  --   --   --   HGB 13.3 12.8* 13.0 13.1  HCT  41.5 39.5 39.8 41.0  MCV 97.2 97.8 95.9 97.6  PLT 88* 83* 98* 102*   Cardiac Enzymes:  Recent Labs Lab 10/28/14 2250  TROPONINI <0.03   BNP: BNP (last 3 results)  Recent Labs  10/28/14 2250  BNP 76.0    ProBNP (last 3 results)  Recent Labs  04/08/14 0359  PROBNP 237.7*    CBG:  Recent Labs Lab 11/03/14 1133 11/03/14 1651 11/03/14 2119 11/04/14 0751 11/04/14 1135  GLUCAP 169* 137* 153* 174* 172*       Signed:  MEMON,JEHANZEB  [facility] 11/04/2014, 7:10 PM

## 2014-11-04 NOTE — Care Management (Signed)
Important Message  Patient Details  Name: ASAF ELMQUIST MRN: [id number] Date of Birth: 08/16/1945   Medicare Important Message Given:  Yes-third notification given    [name], RN 11/04/2014, 1:39 PM

## 2014-11-08 ENCOUNTER — Telehealth: Payer: Self-pay | Admitting: Cardiovascular Disease

## 2014-11-08 NOTE — Telephone Encounter (Signed)
Pt has apt 7/12, Tuesday with you.wife concerned he is on too much Torsemide because his weight dropped from 281 lbs to 244 lbs.she states his target weight was 250 lbs. I told wife I would let you know but that this is something you will discuss with them on Tuesday

## 2014-11-08 NOTE — Telephone Encounter (Signed)
Is he still taking 5 mg metolazone every other day?

## 2014-11-08 NOTE — Telephone Encounter (Signed)
[id number] 

## 2014-11-08 NOTE — Telephone Encounter (Signed)
Patient recently dsch from [facility]  Was put on large amount of diuretics and would like to know if it can be decreased

## 2014-11-11 NOTE — Telephone Encounter (Signed)
Pt never took metolazone

## 2014-11-12 ENCOUNTER — Ambulatory Visit (INDEPENDENT_AMBULATORY_CARE_PROVIDER_SITE_OTHER): Payer: Medicare Other | Admitting: Cardiovascular Disease

## 2014-11-12 ENCOUNTER — Encounter: Payer: Self-pay | Admitting: Cardiovascular Disease

## 2014-11-12 VITALS — BP 108/58 | HR 56 | Ht 71.0 in | Wt 236.0 lb

## 2014-11-12 DIAGNOSIS — E119 Type 2 diabetes mellitus without complications: Secondary | ICD-10-CM | POA: Diagnosis not present

## 2014-11-12 DIAGNOSIS — Z952 Presence of prosthetic heart valve: Secondary | ICD-10-CM

## 2014-11-12 DIAGNOSIS — I10 Essential (primary) hypertension: Secondary | ICD-10-CM

## 2014-11-12 DIAGNOSIS — J438 Other emphysema: Secondary | ICD-10-CM

## 2014-11-12 DIAGNOSIS — R5383 Other fatigue: Secondary | ICD-10-CM

## 2014-11-12 DIAGNOSIS — I25810 Atherosclerosis of coronary artery bypass graft(s) without angina pectoris: Secondary | ICD-10-CM

## 2014-11-12 DIAGNOSIS — Z9289 Personal history of other medical treatment: Secondary | ICD-10-CM

## 2014-11-12 DIAGNOSIS — Z794 Long term (current) use of insulin: Secondary | ICD-10-CM

## 2014-11-12 DIAGNOSIS — E785 Hyperlipidemia, unspecified: Secondary | ICD-10-CM

## 2014-11-12 DIAGNOSIS — I5032 Chronic diastolic (congestive) heart failure: Secondary | ICD-10-CM

## 2014-11-12 DIAGNOSIS — Z954 Presence of other heart-valve replacement: Secondary | ICD-10-CM | POA: Diagnosis not present

## 2014-11-12 DIAGNOSIS — Z87898 Personal history of other specified conditions: Secondary | ICD-10-CM

## 2014-11-12 DIAGNOSIS — I4892 Unspecified atrial flutter: Secondary | ICD-10-CM

## 2014-11-12 MED ORDER — DILTIAZEM HCL ER COATED BEADS 120 MG PO CP24: 120.0000 mg | ORAL_CAPSULE | Freq: Every day | ORAL | Status: DC

## 2014-11-12 MED ORDER — TORSEMIDE 20 MG PO TABS: 20.0000 mg | ORAL_TABLET | Freq: Two times a day (BID) | ORAL | Status: DC

## 2014-11-12 MED ORDER — POTASSIUM CHLORIDE CRYS ER 20 MEQ PO TBCR: 20.0000 meq | EXTENDED_RELEASE_TABLET | Freq: Every day | ORAL | Status: DC

## 2014-11-12 NOTE — Progress Notes (Signed)
Patient ID: [name], male   DOB: [birth year], 69 y.o.   MRN: [id number]      SUBJECTIVE: The patient returns for posthospitalization follow-up for acute on chronic diastolic heart failure and rapid atrial fibrillation and flutter. He was diuresed 14 pounds but wanted to go home early. He has a history of CABG and TAVR. He is down to 236 lbs from 267 lbs on 05/31/14.  He denies chest pain and shortness of breath, but feels fatigued and his wife is concerned that he has been over diuresed. She feels his thinking has not been as clear and has had some cognitive decline. The progressive leg swelling developed 3 weeks prior to his hospitalization. Upon further discussion, he had been eating a lot of popcorn and adding salt to it. He said he is no longer adding salt to anything.   Review of Systems: As per "subjective", otherwise negative.  No Known Allergies  Current Outpatient Prescriptions  Medication Sig Dispense Refill  . albuterol-ipratropium (COMBIVENT) 18-103 MCG/ACT inhaler Inhale 2 puffs into the lungs every 6 (six) hours as needed for wheezing or shortness of breath.     Marland Kitchen ascorbic acid (VITAMIN C) 500 MG tablet Take 500 mg by mouth daily.     Marland Kitchen aspirin 81 MG tablet Take 1 tablet (81 mg total) by mouth daily.    . bisacodyl (DULCOLAX) 5 MG EC tablet Take 2 tablets (10 mg total) by mouth daily as needed for moderate constipation (Give daily if no BM). 30 tablet 0  . budesonide (PULMICORT) 0.25 MG/2ML nebulizer solution Take 0.25 mg by nebulization 2 (two) times daily.    . buprenorphine (BUTRANS) 10 MCG/HR PTWK patch Place 10 mcg onto the skin once a week.    . cetirizine (ZYRTEC) 10 MG tablet Take 10 mg by mouth at bedtime.    . clopidogrel (PLAVIX) 75 MG tablet Take 1 tablet (75 mg total) by mouth daily. 90 tablet 3  . co-enzyme Q-10 30 MG capsule Take 30 mg by mouth at bedtime.     Marland Kitchen desonide (DESOWEN) 0.05 % cream Apply 1 application topically 2 (two) times daily as needed  (for rash).     Marland Kitchen diltiazem (CARDIZEM CD) 180 MG 24 hr capsule Take 1 capsule (180 mg total) by mouth daily. 30 capsule 1  . ezetimibe (ZETIA) 10 MG tablet Take 10 mg by mouth at bedtime.     . formoterol (PERFOROMIST) 20 MCG/2ML nebulizer solution Take 2 mLs (20 mcg total) by nebulization 2 (two) times daily. Use in nebulizer twice daily perfectly regularly 120 mL 11  . insulin NPH-regular Human (NOVOLIN 70/30) (70-30) 100 UNIT/ML injection Inject 15 Units into the skin 2 (two) times daily. 10 mL 11  . ipratropium-albuterol (DUONEB) 0.5-2.5 (3) MG/3ML SOLN Take 3 mLs by nebulization every 6 (six) hours as needed (for shortness of breath).    Marland Kitchen ketoconazole (NIZORAL) 2 % cream Apply 1 application topically 2 (two) times daily as needed for irritation.     Marland Kitchen loratadine (CLARITIN) 10 MG tablet Take 10 mg by mouth every morning.     . metFORMIN (GLUCOPHAGE) 1000 MG tablet Take 1 tablet (1,000 mg total) by mouth 2 (two) times daily. Hold for 48 hours, restart on 02/10/2014.    . metoprolol succinate (TOPROL-XL) 25 MG 24 hr tablet Take 1 tablet (25 mg total) by mouth 2 (two) times daily. 180 tablet 2  . Multiple Vitamin (MULTIVITAMIN) tablet Take 1 tablet by mouth daily.      Marland Kitchen  oxyCODONE-acetaminophen (PERCOCET/ROXICET) 5-325 MG per tablet Take 1-2 tablets by mouth every 4 (four) hours as needed for moderate pain. 40 tablet 0  . polyethylene glycol (MIRALAX / GLYCOLAX) packet Take 17 g by mouth daily as needed for moderate constipation. 14 each 0  . potassium chloride (KLOR-CON) 20 MEQ packet Take 40 mEq by mouth daily. 60 packet 1  . predniSONE (DELTASONE) 5 MG tablet Take 5 mg by mouth daily with breakfast.    . torsemide (DEMADEX) 20 MG tablet Take 60mg  in the morning and 40mg  in the evening. may take extra as needed for swelling 180 tablet 3   No current facility-administered medications for this visit.    Past Medical History  Diagnosis Date  . Rheumatoid arthritis(714.0)     chronic  Prednisone  . COPD (chronic obstructive pulmonary disease)   . Elevated liver function tests   . Hypertension   . Aortic stenosis     a. 2D ECHO: 01/30/2014: ef 50-55%. No RWMAs. Severe TK:WIOXB area (VTI): 0.82 cm^2. Valve area (Vmax): 0.74 cm^2. Valve area (Vmean): 0.75 Cm^2. Mod LA/RA dilation. PA peak pressure 33  . CAD (coronary artery disease)   . GERD (gastroesophageal reflux disease)   . Chronic atrial flutter   . 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 Degenerative disc disease, cervical   . Degenerative disc disease, lumbar 2010  . Morbid obesity   . Coronary atherosclerosis of artery bypass graft 2010  . Acute on chronic diastolic heart failure 02/01/2014  . Chronic diastolic congestive heart failure 02/01/2014  . Epistaxis     Developed on Xarelto therapy  . Cirrhosis 02/01/2014  . Portal hypertension 02/01/2014  . Chronic pancreatitis 02/01/2014    Noted on CT scan  . Shortness of breath   . Dizziness   . Heart murmur   . Asthma     uses nebulizers daily  . Diabetes mellitus     fasting blood glucose 120-130  . Pneumonia     hx of  . Dysrhythmia     fib/flutter  . Anemia     was on xalerto sept 2015 for afib - was taken off of - had to get unit of prbc  . History of blood transfusion   . 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 Coronary stent placement  01/02/2009    s/p bare metal stent to the vein graft to the RCA 9/20. nml LV function   . Appendectomy    . Coronary artery bypass graft  03/27/1997    CABG x 5 by Dr Cornelius Moras  . Anterior cervical decompression      with posterior decompression and fusion   . Tonsillectomy and adenoidectomy    . Lumbar spine surgery    . Esophagogastroduodenoscopy (egd) with  propofol N/A 02/01/2014    Procedure: ESOPHAGOGASTRODUODENOSCOPY (EGD) WITH PROPOFOL;  Surgeon: Shirley Friar, MD;  Location: [facility] ENDOSCOPY;  Service: Endoscopy;  Laterality: N/A;  . Colonoscopy with propofol N/A 02/01/2014    Procedure: COLONOSCOPY WITH PROPOFOL;  Surgeon: Shirley Friar, MD;  Location: [facility] ENDOSCOPY;  Service: Endoscopy;  Laterality: N/A;  . Multiple extractions with alveoloplasty N/A 03/12/2014    Procedure: MULTIPLE EXTRACTION OF  TOOTH #'S 2,6,7,8,9,10,11,14,18,20,29,31 WITH ALVEOLOPLASTY AND GROSS DEBRIDMENT OF REMAINING TEETH;  Surgeon: Charlynne Pander, DDS;  Location: [facility] OR;  Service: Oral Surgery;  Laterality: N/A;  . Coronary angioplasty      1 stent  . Transcatheter aortic valve replacement, transfemoral N/A 04/03/2014    Procedure: TRANSCATHETER AORTIC VALVE REPLACEMENT, TRANSFEMORAL;  Surgeon: Purcell Nails, MD;  Location: [facility] OR;  Service: Open Heart Surgery;  Laterality: N/A;  . Intraoperative transesophageal echocardiogram N/A 04/03/2014    Procedure: INTRAOPERATIVE TRANSESOPHAGEAL ECHOCARDIOGRAM;  Surgeon: Purcell Nails, MD;  Location: [facility] OR;  Service: Open Heart Surgery;  Laterality: N/A;  . Left and right heart catheterization with coronary/graft angiogram N/A 02/05/2014    Procedure: LEFT AND RIGHT HEART CATHETERIZATION WITH Isabel Caprice;  Surgeon: Peter M Swaziland, MD;  Location: [facility] CATH LAB;  Service: Cardiovascular;  Laterality: N/A;    History   Social History  . Marital Status: Married    Spouse Name: N/A  . Number of Children: N/A  . Years of Education: N/A   Occupational History  . Retired Special educational needs teacher    Social History Main Topics  . Smoking status: Former Smoker -- 3.00 packs/day for 35 years    Types: Cigarettes    Start date: 09/15/1961    Quit date: 11/23/1996  . Smokeless tobacco: Current User    Types: Chew     Comment: chews 2-3 packs per week  . Alcohol Use: No  . Drug Use: No  . Sexual Activity: Not on file    Other Topics Concern  . Not on file   Social History Narrative   Married, retired.      Filed Vitals:   11/12/14 1431  BP: 108/58  Pulse: 56  Height: 5\' 11"  (1.803 m)  Weight: 236 lb (107.049 kg)  SpO2: 75%    PHYSICAL EXAM General: NAD HEENT: Normal. Neck: No JVD, no thyromegaly. Lungs: Clear to auscultation bilaterally with normal respiratory effort. CV: Irregular rhythm, normal S1/S2, no S3, no murmur. No pretibial or periankle edema.  Abdomen: Soft, obese, no distention.  Neurologic: Alert and oriented.  Psych: Normal affect. Skin: Normal. Musculoskeletal: No gross deformities. Extremities: No clubbing or cyanosis.   ECG: Most recent ECG reviewed.      ASSESSMENT AND PLAN: 1. CAD: No anginal discomfort. Has SVG stenosis, for which medical therapy was recommended. Will continue Plavix, ASA, Zetia, and metoprolol.  2. Essential HTN: Low normal and fatigued. Will reduce long-acting diltiazem to 120 mg daily.  3. Hyperlipidemia: On Zetia. Followed by PCP. Intolerant to Lipitor.   4. Paroxysmal atrial flutter/atrial fibrillation: HR is controlled on long-acting diltiazem 180 mg daily. However, due to fatigue and low normal BP, will reduce to 120 mg daily. He is a poor candidate for anticoagulation given portal hypertension with esophageal varices. Will continue ASA and Plavix for now.   5. Severe aortic stenosis s/p TAVR: Echocardiogram on 05/29/14 showed a normally functioning prosthetic aortic valve with severe left atrial dilatation and normal LV systolic function. Dual antiplatelet therapy x 6 months at a minimum (will continue for now).  6. COPD: Followed by Dr. 05/31/14. Appears to be stable. Uses oxygen at night.  7. Chronic diastolic heart failure: Euvolemic. I will reduce torsemide to 20 mg bid and check BMET. Will reduce KCl to 20 meq daily.  8. 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 On insulin.  Dispo: f/u 6 weeks.  Sherene Sires, M.D., F.A.C.C.

## 2014-11-12 NOTE — Patient Instructions (Addendum)
Your physician recommends that you schedule a follow-up appointment in: 6 weeks with Dr Purvis Sheffield   DECREASE Diltiazem to 120 mg daily  DECREASE Potassium to 20 meq daily  DECREASE Torsemide to 20 mg twice a day   Please get lab work: BMET      Thank you for choosing [facility] !

## 2014-11-13 ENCOUNTER — Telehealth: Payer: Self-pay

## 2014-11-13 DIAGNOSIS — E876 Hypokalemia: Secondary | ICD-10-CM

## 2014-11-13 MED ORDER — POTASSIUM CHLORIDE 20 MEQ PO PACK: 40.0000 meq | PACK | Freq: Every day | ORAL | Status: DC

## 2014-11-13 NOTE — Telephone Encounter (Signed)
-----   Message from Laqueta Linden, MD sent at 11/13/2014  3:00 PM EDT ----- Markedly hypokalemic. Take 80 meq KCL today, tomorrow, and following day, and repeat BMET in two days.

## 2014-11-13 NOTE — Telephone Encounter (Signed)
Spoke with wife will increase potassium as directed in MD note,order placed for BMET

## 2014-11-28 ENCOUNTER — Other Ambulatory Visit: Payer: Self-pay

## 2014-11-28 DIAGNOSIS — I350 Nonrheumatic aortic (valve) stenosis: Secondary | ICD-10-CM

## 2014-11-28 DIAGNOSIS — Z952 Presence of prosthetic heart valve: Secondary | ICD-10-CM

## 2014-12-26 ENCOUNTER — Encounter (HOSPITAL_COMMUNITY): Payer: Self-pay | Admitting: General Practice

## 2014-12-26 ENCOUNTER — Inpatient Hospital Stay (HOSPITAL_COMMUNITY)
Admission: AD | Admit: 2014-12-26 | Discharge: 2014-12-31 | DRG: 242 | Disposition: A | Discharge status: Home / Self Care | Payer: Medicare Other | Source: Other Acute Inpatient Hospital | Attending: Cardiovascular Disease | Admitting: Cardiovascular Disease

## 2014-12-26 DIAGNOSIS — K7290 Hepatic failure, unspecified without coma: Secondary | ICD-10-CM | POA: Diagnosis not present

## 2014-12-26 DIAGNOSIS — I442 Atrioventricular block, complete: Secondary | ICD-10-CM | POA: Diagnosis present

## 2014-12-26 DIAGNOSIS — I25810 Atherosclerosis of coronary artery bypass graft(s) without angina pectoris: Secondary | ICD-10-CM | POA: Diagnosis present

## 2014-12-26 DIAGNOSIS — J45909 Unspecified asthma, uncomplicated: Secondary | ICD-10-CM | POA: Diagnosis not present

## 2014-12-26 DIAGNOSIS — E871 Hypo-osmolality and hyponatremia: Secondary | ICD-10-CM | POA: Diagnosis not present

## 2014-12-26 DIAGNOSIS — I8510 Secondary esophageal varices without bleeding: Secondary | ICD-10-CM | POA: Diagnosis present

## 2014-12-26 DIAGNOSIS — Z953 Presence of xenogenic heart valve: Secondary | ICD-10-CM

## 2014-12-26 DIAGNOSIS — Z87891 Personal history of nicotine dependence: Secondary | ICD-10-CM | POA: Diagnosis not present

## 2014-12-26 DIAGNOSIS — I4892 Unspecified atrial flutter: Secondary | ICD-10-CM | POA: Diagnosis not present

## 2014-12-26 DIAGNOSIS — N170 Acute kidney failure with tubular necrosis: Secondary | ICD-10-CM | POA: Diagnosis present

## 2014-12-26 DIAGNOSIS — R778 Other specified abnormalities of plasma proteins: Secondary | ICD-10-CM | POA: Diagnosis present

## 2014-12-26 DIAGNOSIS — I4891 Unspecified atrial fibrillation: Secondary | ICD-10-CM | POA: Diagnosis present

## 2014-12-26 DIAGNOSIS — I272 Other secondary pulmonary hypertension: Secondary | ICD-10-CM | POA: Diagnosis not present

## 2014-12-26 DIAGNOSIS — M069 Rheumatoid arthritis, unspecified: Secondary | ICD-10-CM | POA: Diagnosis present

## 2014-12-26 DIAGNOSIS — E876 Hypokalemia: Secondary | ICD-10-CM | POA: Diagnosis present

## 2014-12-26 DIAGNOSIS — Z955 Presence of coronary angioplasty implant and graft: Secondary | ICD-10-CM

## 2014-12-26 DIAGNOSIS — R34 Anuria and oliguria: Secondary | ICD-10-CM | POA: Diagnosis not present

## 2014-12-26 DIAGNOSIS — E119 Type 2 diabetes mellitus without complications: Secondary | ICD-10-CM | POA: Diagnosis present

## 2014-12-26 DIAGNOSIS — K7460 Unspecified cirrhosis of liver: Secondary | ICD-10-CM | POA: Diagnosis present

## 2014-12-26 DIAGNOSIS — N179 Acute kidney failure, unspecified: Secondary | ICD-10-CM | POA: Diagnosis not present

## 2014-12-26 DIAGNOSIS — Z9981 Dependence on supplemental oxygen: Secondary | ICD-10-CM

## 2014-12-26 DIAGNOSIS — R0600 Dyspnea, unspecified: Secondary | ICD-10-CM | POA: Diagnosis not present

## 2014-12-26 DIAGNOSIS — J449 Chronic obstructive pulmonary disease, unspecified: Secondary | ICD-10-CM | POA: Diagnosis present

## 2014-12-26 DIAGNOSIS — R001 Bradycardia, unspecified: Secondary | ICD-10-CM | POA: Diagnosis present

## 2014-12-26 DIAGNOSIS — I5033 Acute on chronic diastolic (congestive) heart failure: Secondary | ICD-10-CM | POA: Diagnosis present

## 2014-12-26 DIAGNOSIS — Z7902 Long term (current) use of antithrombotics/antiplatelets: Secondary | ICD-10-CM

## 2014-12-26 DIAGNOSIS — Z950 Presence of cardiac pacemaker: Secondary | ICD-10-CM | POA: Diagnosis not present

## 2014-12-26 DIAGNOSIS — I5031 Acute diastolic (congestive) heart failure: Secondary | ICD-10-CM | POA: Diagnosis not present

## 2014-12-26 DIAGNOSIS — Z952 Presence of prosthetic heart valve: Secondary | ICD-10-CM

## 2014-12-26 DIAGNOSIS — K766 Portal hypertension: Secondary | ICD-10-CM | POA: Diagnosis present

## 2014-12-26 DIAGNOSIS — I10 Essential (primary) hypertension: Secondary | ICD-10-CM | POA: Diagnosis not present

## 2014-12-26 DIAGNOSIS — Z951 Presence of aortocoronary bypass graft: Secondary | ICD-10-CM | POA: Diagnosis not present

## 2014-12-26 DIAGNOSIS — Z7982 Long term (current) use of aspirin: Secondary | ICD-10-CM

## 2014-12-26 DIAGNOSIS — K219 Gastro-esophageal reflux disease without esophagitis: Secondary | ICD-10-CM | POA: Diagnosis not present

## 2014-12-26 DIAGNOSIS — K5900 Constipation, unspecified: Secondary | ICD-10-CM | POA: Diagnosis present

## 2014-12-26 DIAGNOSIS — I482 Chronic atrial fibrillation: Secondary | ICD-10-CM | POA: Diagnosis not present

## 2014-12-26 DIAGNOSIS — I2510 Atherosclerotic heart disease of native coronary artery without angina pectoris: Secondary | ICD-10-CM | POA: Diagnosis present

## 2014-12-26 DIAGNOSIS — Z794 Long term (current) use of insulin: Secondary | ICD-10-CM | POA: Diagnosis not present

## 2014-12-26 DIAGNOSIS — R4182 Altered mental status, unspecified: Secondary | ICD-10-CM | POA: Diagnosis present

## 2014-12-26 DIAGNOSIS — D696 Thrombocytopenia, unspecified: Secondary | ICD-10-CM | POA: Diagnosis not present

## 2014-12-26 DIAGNOSIS — R7989 Other specified abnormal findings of blood chemistry: Secondary | ICD-10-CM | POA: Diagnosis not present

## 2014-12-26 DIAGNOSIS — Z7952 Long term (current) use of systemic steroids: Secondary | ICD-10-CM | POA: Diagnosis not present

## 2014-12-26 HISTORY — DX: Dependence on supplemental oxygen: Z99.81

## 2014-12-26 HISTORY — DX: Other complications of anesthesia, initial encounter: T88.59XA

## 2014-12-26 HISTORY — DX: Dorsalgia, unspecified: M54.9

## 2014-12-26 HISTORY — DX: Presence of cardiac pacemaker: Z95.0

## 2014-12-26 HISTORY — DX: Type 2 diabetes mellitus without complications: E11.9

## 2014-12-26 HISTORY — DX: Other specified abnormal findings of blood chemistry: R79.89

## 2014-12-26 HISTORY — DX: Other myositis, left thigh: M60.852

## 2014-12-26 HISTORY — DX: Acute kidney failure, unspecified: N17.9

## 2014-12-26 HISTORY — DX: Adverse effect of unspecified anesthetic, initial encounter: T41.45XA

## 2014-12-26 HISTORY — DX: Hyperlipidemia, unspecified: E78.5

## 2014-12-26 HISTORY — DX: Atrioventricular block, complete: I44.2

## 2014-12-26 HISTORY — DX: Hypo-osmolality and hyponatremia: E87.1

## 2014-12-26 HISTORY — DX: Unspecified osteoarthritis, unspecified site: M19.90

## 2014-12-26 HISTORY — DX: Other chronic pain: G89.29

## 2014-12-26 LAB — CBC
HCT: 36.9 % — ABNORMAL LOW (ref 39.0–52.0)
HEMOGLOBIN: 12 g/dL — AB (ref 13.0–17.0)
MCH: 32 pg (ref 26.0–34.0)
MCHC: 32.5 g/dL (ref 30.0–36.0)
MCV: 98.4 fL (ref 78.0–100.0)
Platelets: 81 10*3/uL — ABNORMAL LOW (ref 150–400)
RBC: 3.75 MIL/uL — AB (ref 4.22–5.81)
RDW: 17 % — ABNORMAL HIGH (ref 11.5–15.5)
WBC: 6 10*3/uL (ref 4.0–10.5)

## 2014-12-26 LAB — GLUCOSE, CAPILLARY
GLUCOSE-CAPILLARY: 125 mg/dL — AB (ref 65–99)
Glucose-Capillary: 188 mg/dL — ABNORMAL HIGH (ref 65–99)

## 2014-12-26 LAB — TSH: TSH: 1.836 u[IU]/mL (ref 0.350–4.500)

## 2014-12-26 LAB — COMPREHENSIVE METABOLIC PANEL
ALT: 38 U/L (ref 17–63)
ANION GAP: 14 (ref 5–15)
AST: 68 U/L — ABNORMAL HIGH (ref 15–41)
Albumin: 2.9 g/dL — ABNORMAL LOW (ref 3.5–5.0)
Alkaline Phosphatase: 104 U/L (ref 38–126)
BILIRUBIN TOTAL: 0.9 mg/dL (ref 0.3–1.2)
BUN: 33 mg/dL — ABNORMAL HIGH (ref 6–20)
CO2: 28 mmol/L (ref 22–32)
Calcium: 8.9 mg/dL (ref 8.9–10.3)
Chloride: 92 mmol/L — ABNORMAL LOW (ref 101–111)
Creatinine, Ser: 3.36 mg/dL — ABNORMAL HIGH (ref 0.61–1.24)
GFR calc Af Amer: 20 mL/min — ABNORMAL LOW (ref >60)
GFR, EST NON AFRICAN AMERICAN: 17 mL/min — AB (ref >60)
Glucose, Bld: 182 mg/dL — ABNORMAL HIGH (ref 65–99)
POTASSIUM: 4 mmol/L (ref 3.5–5.1)
Sodium: 134 mmol/L — ABNORMAL LOW (ref 135–145)
TOTAL PROTEIN: 6.5 g/dL (ref 6.5–8.1)

## 2014-12-26 LAB — MAGNESIUM: MAGNESIUM: 1.9 mg/dL (ref 1.7–2.4)

## 2014-12-26 LAB — TROPONIN I: TROPONIN I: 0.33 ng/mL — AB (ref <0.031)

## 2014-12-26 LAB — MRSA PCR SCREENING: MRSA BY PCR: NEGATIVE

## 2014-12-26 MED ORDER — ASPIRIN 81 MG PO TABS
81.0000 mg | ORAL_TABLET | Freq: Every day | ORAL | Status: DC
  Administered 2014-12-28 – 2014-12-30 (×3): 81 mg via ORAL
  Filled 2014-12-26 (×11): qty 1

## 2014-12-26 MED ORDER — INSULIN ASPART 100 UNIT/ML ~~LOC~~ SOLN
0.0000 [IU] | Freq: Three times a day (TID) | SUBCUTANEOUS | Status: DC
  Administered 2014-12-27 (×2): 2 [IU] via SUBCUTANEOUS
  Administered 2014-12-28: 3 [IU] via SUBCUTANEOUS
  Administered 2014-12-28: 2 [IU] via SUBCUTANEOUS
  Administered 2014-12-28: 1 [IU] via SUBCUTANEOUS
  Administered 2014-12-29 (×3): 2 [IU] via SUBCUTANEOUS
  Administered 2014-12-30: 1 [IU] via SUBCUTANEOUS
  Administered 2014-12-30 (×2): 2 [IU] via SUBCUTANEOUS
  Administered 2014-12-31: 1 [IU] via SUBCUTANEOUS

## 2014-12-26 MED ORDER — PREDNISONE 5 MG PO TABS
5.0000 mg | ORAL_TABLET | Freq: Every day | ORAL | Status: DC
  Administered 2014-12-27 – 2014-12-31 (×5): 5 mg via ORAL
  Filled 2014-12-26 (×5): qty 1

## 2014-12-26 MED ORDER — IPRATROPIUM-ALBUTEROL 0.5-2.5 (3) MG/3ML IN SOLN
3.0000 mL | Freq: Four times a day (QID) | RESPIRATORY_TRACT | Status: DC | PRN
  Administered 2014-12-27: 3 mL via RESPIRATORY_TRACT
  Filled 2014-12-26: qty 3

## 2014-12-26 MED ORDER — CETYLPYRIDINIUM CHLORIDE 0.05 % MT LIQD
7.0000 mL | Freq: Two times a day (BID) | OROMUCOSAL | Status: DC
  Administered 2014-12-26 – 2014-12-31 (×9): 7 mL via OROMUCOSAL

## 2014-12-26 MED ORDER — POTASSIUM CHLORIDE 20 MEQ PO PACK
40.0000 meq | PACK | Freq: Every day | ORAL | Status: DC
  Filled 2014-12-26: qty 2

## 2014-12-26 MED ORDER — OXYCODONE-ACETAMINOPHEN 5-325 MG PO TABS
1.0000 | ORAL_TABLET | ORAL | Status: DC | PRN
  Administered 2014-12-26 – 2014-12-31 (×14): 1 via ORAL
  Filled 2014-12-26 (×14): qty 1

## 2014-12-26 MED ORDER — IPRATROPIUM-ALBUTEROL 18-103 MCG/ACT IN AERO: 2.0000 | INHALATION_SPRAY | Freq: Four times a day (QID) | RESPIRATORY_TRACT | Status: DC | PRN

## 2014-12-26 MED ORDER — BUDESONIDE 0.25 MG/2ML IN SUSP
0.2500 mg | Freq: Two times a day (BID) | RESPIRATORY_TRACT | Status: DC
  Administered 2014-12-26 – 2014-12-31 (×10): 0.25 mg via RESPIRATORY_TRACT
  Filled 2014-12-26 (×11): qty 2

## 2014-12-26 MED ORDER — EZETIMIBE 10 MG PO TABS
10.0000 mg | ORAL_TABLET | Freq: Every day | ORAL | Status: DC
  Administered 2014-12-26 – 2014-12-30 (×5): 10 mg via ORAL
  Filled 2014-12-26 (×5): qty 1

## 2014-12-26 MED ORDER — CLOPIDOGREL BISULFATE 75 MG PO TABS
75.0000 mg | ORAL_TABLET | Freq: Every day | ORAL | Status: DC
  Filled 2014-12-26: qty 1

## 2014-12-26 MED ORDER — ARFORMOTEROL TARTRATE 15 MCG/2ML IN NEBU
15.0000 ug | INHALATION_SOLUTION | Freq: Two times a day (BID) | RESPIRATORY_TRACT | Status: DC
  Administered 2014-12-26 – 2014-12-31 (×9): 15 ug via RESPIRATORY_TRACT
  Filled 2014-12-26 (×13): qty 2

## 2014-12-26 MED ORDER — POTASSIUM CHLORIDE CRYS ER 20 MEQ PO TBCR
40.0000 meq | EXTENDED_RELEASE_TABLET | Freq: Every day | ORAL | Status: DC
  Administered 2014-12-27 – 2014-12-31 (×5): 40 meq via ORAL
  Filled 2014-12-26 (×5): qty 2

## 2014-12-26 NOTE — H&P (Signed)
Patient ID: [name] MRN: [id number], DOB/AGE: November 25, 1945   Admit date: 12/26/2014   Primary Physician: Donzetta Sprung, MD Primary Cardiologist: Purvis Sheffield  Pt. Profile:  69 y/o male with a h/o CAD s/p CABG, AS s/p TAVR, atrial fibrillation, chronic diastolic CHF, HTN, DM, cirrhosis, and COPD admitted for acute on chronic diastolic CHF and symptomatic bradycardia.   Problem List  Past Medical History  Diagnosis Date  . Rheumatoid arthritis(714.0)     chronic Prednisone  . COPD (chronic obstructive pulmonary disease)   . Elevated liver function tests   . Hypertension   . Aortic stenosis     a. 2D ECHO: 01/30/2014: ef 50-55%. No RWMAs. Severe JQ:BHALP area (VTI): 0.82 cm^2. Valve area (Vmax): 0.74 cm^2. Valve area (Vmean): 0.75 Cm^2. Mod LA/RA dilation. PA peak pressure 33  . CAD (coronary artery disease)   . GERD (gastroesophageal reflux disease)   . Chronic atrial flutter   . 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 Degenerative disc disease, cervical   . Degenerative disc disease, lumbar 2010  . Morbid obesity   . Coronary atherosclerosis of artery bypass graft 2010  . Acute on chronic diastolic heart failure 02/01/2014  . Chronic diastolic congestive heart failure 02/01/2014  . Epistaxis     Developed on Xarelto therapy  . Cirrhosis 02/01/2014  . Portal hypertension 02/01/2014  . Chronic pancreatitis 02/01/2014    Noted on CT scan  . Shortness of breath   . Dizziness   . Heart murmur   . Asthma     uses nebulizers daily  . Diabetes mellitus     fasting blood glucose 120-130  . Pneumonia     hx of  . Dysrhythmia     fib/flutter  . Anemia     was on xalerto sept 2015 for afib - was taken off of - had to get unit of prbc  . History of blood transfusion   . S/P TAVR (transcatheter aortic valve replacement) 04/03/2014    29 mm Edwards Sapien 3 transcatheter heart valve  placed via open left transfemoral approach    Past Surgical History  Procedure Laterality Date  . Coronary stent placement  01/02/2009    s/p bare metal stent to the vein graft to the RCA 9/20. nml LV function   . Appendectomy    . Coronary artery bypass graft  03/27/1997    CABG x 5 by Dr Cornelius Moras  . Anterior cervical decompression      with posterior decompression and fusion   . Tonsillectomy and adenoidectomy    . Lumbar spine surgery    . Esophagogastroduodenoscopy (egd) with propofol N/A 02/01/2014    Procedure: ESOPHAGOGASTRODUODENOSCOPY (EGD) WITH PROPOFOL;  Surgeon: Shirley Friar, MD;  Location[facility] ENDOSCOPY;  Service: Endoscopy;  Laterality: N/A;  . Colonoscopy with propofol N/A 02/01/2014    Procedure: COLONOSCOPY WITH PROPOFOL;  Surgeon: Shirley Friar, MD;  Location: [facility] ENDOSCOPY;  Service: Endoscopy;  Laterality: N/A;  . Multiple extractions with alveoloplasty N/A 03/12/2014    Procedure: MULTIPLE EXTRACTION OF TOOTH #'S 2,6,7,8,9,10,11,14,18,20,29,31 WITH ALVEOLOPLASTY AND GROSS DEBRIDMENT OF REMAINING TEETH;  Surgeon: Charlynne Pander, DDS;  Location: [facility] OR;  Service: Oral Surgery;  Laterality: N/A;  . Coronary angioplasty      1 stent  . Transcatheter aortic valve replacement, transfemoral N/A 04/03/2014    Procedure:  TRANSCATHETER AORTIC VALVE REPLACEMENT, TRANSFEMORAL;  Surgeon: Purcell Nails, MD;  Location: [facility] OR;  Service: Open Heart Surgery;  Laterality: N/A;  . Intraoperative transesophageal echocardiogram N/A 04/03/2014    Procedure: INTRAOPERATIVE TRANSESOPHAGEAL ECHOCARDIOGRAM;  Surgeon: Purcell Nails, MD;  Location: [facility] OR;  Service: Open Heart Surgery;  Laterality: N/A;  . Left and right heart catheterization with coronary/graft angiogram N/A 02/05/2014    Procedure: LEFT AND RIGHT HEART CATHETERIZATION WITH Isabel Caprice;  Surgeon: Peter M Swaziland, MD;  Location: [facility] CATH LAB;  Service: Cardiovascular;  Laterality: N/A;     Allergies  No Known  Allergies  HPI  69 y/o male, followed by Dr. Joyce Copa, with a h/o CAD s/p CAGB x5 in 1998, AS s/p TAVR 04/2014, atrial fibrillation and chronic diastolic CHF. His last [facility] 01/2014 demonstrated patent LIMA-LAD, patent SVG-RCA, patent SVG to diagonal with 80% proximal stenosis and pateient SVG to the OM1 and OM2. There was 80% stenosis in continuation of graft to OM2. His most recent 2D echo 05/2014 showed normal LV systolic function with an EF of 55-60%. Wall motion was normal. Visualization of his aortiv valve showed stent-valve bioprosthesis was present and functioning normally. His other PMH includes HTN, DM, COPD, cirrhosis and GERD. In regards to his atrial fibrillation, he is a poor candidate for anticoagulation given portal hypertension with esophageal varices. He is on daily ASA + Plavix.   He was recently hospitalized 10/28/14-11/04/14 by Internal Medicine for acute on chronic diastolic CHF. Patient was started on intravenous Lasix as well as metolazone. He diuresed approximately 14 pounds. He was transition to torsemide and metolazone and discharge home. He was seen by Dr. Purvis Sheffield for post hospital f/u on 11/12/14 and was noted to be stable from a cardiac standpoint. His office weight was 236 lb.  He presented back to [facility] today with complaints of weakness, dizziness and DOE that first began last night. He denies resting dyspnea, orthopnea/PND. He also denies CP. He has experience near syncope but denies frank syncope. No recent changes in medications.   In ED at [facility], his EKG shows atrial fibrillation with a SVR in the 30s. BP was stable. Labs were notable for elevated BNP at 1900 and abnormal troponin at 0.19 (RR [0.00-0.01] at OSH). Slight hypokalemia with K of 3.4. Hgb 12.0. WBC normal. SCr was mark ely abnormal at 2.66 (baseline ~1). Given his marked bradycardia, he was transferred to [facility].  On arrival, he is roomed and stable. Telemetry shows afib with SVR in the 40s. He  is asymptomatic at rest and mentating well.   Home Medications  Prior to Admission medications   Medication Sig Start Date End Date Taking? Authorizing Willisha Sligar  albuterol-ipratropium (COMBIVENT) 18-103 MCG/ACT inhaler Inhale 2 puffs into the lungs every 6 (six) hours as needed for wheezing or shortness of breath.     Historical Dustyn Dansereau, MD  ascorbic acid (VITAMIN C) 500 MG tablet Take 500 mg by mouth daily.     Historical Mikyle Sox, MD  aspirin 81 MG tablet Take 1 tablet (81 mg total) by mouth daily. 02/07/14   Rhonda G Barrett, PA-C  bisacodyl (DULCOLAX) 5 MG EC tablet Take 2 tablets (10 mg total) by mouth daily as needed for moderate constipation (Give daily if no BM). 04/09/14   Rhonda G Barrett, PA-C  budesonide (PULMICORT) 0.25 MG/2ML nebulizer solution Take 0.25 mg by nebulization 2 (two) times daily.    Historical Francesco Provencal, MD  buprenorphine (BUTRANS) 10 MCG/HR PTWK patch Place 10 mcg onto  the skin once a week.    Historical Atari Novick, MD  cetirizine (ZYRTEC) 10 MG tablet Take 10 mg by mouth at bedtime.    Historical Claudell Rhody, MD  clopidogrel (PLAVIX) 75 MG tablet Take 1 tablet (75 mg total) by mouth daily. 04/11/14   Laqueta Linden, MD  co-enzyme Q-10 30 MG capsule Take 30 mg by mouth at bedtime.     Historical Boaz Berisha, MD  desonide (DESOWEN) 0.05 % cream Apply 1 application topically 2 (two) times daily as needed (for rash).     Historical Sumiye Hirth, MD  diltiazem (CARDIZEM CD) 120 MG 24 hr capsule Take 1 capsule (120 mg total) by mouth daily. 11/12/14   Laqueta Linden, MD  ezetimibe (ZETIA) 10 MG tablet Take 10 mg by mouth at bedtime.     Historical Hussain Maimone, MD  formoterol (PERFOROMIST) 20 MCG/2ML nebulizer solution Take 2 mLs (20 mcg total) by nebulization 2 (two) times daily. Use in nebulizer twice daily perfectly regularly 03/21/13   Nyoka Cowden, MD  insulin NPH-regular Human (NOVOLIN 70/30) (70-30) 100 UNIT/ML injection Inject 15 Units into the skin 2 (two) times daily.  11/04/14   Erick Blinks, MD  ipratropium-albuterol (DUONEB) 0.5-2.5 (3) MG/3ML SOLN Take 3 mLs by nebulization every 6 (six) hours as needed (for shortness of breath).    Historical Hurschel Paynter, MD  ketoconazole (NIZORAL) 2 % cream Apply 1 application topically 2 (two) times daily as needed for irritation.     Historical Susanne Baumgarner, MD  loratadine (CLARITIN) 10 MG tablet Take 10 mg by mouth every morning.     Historical Takuma Cifelli, MD  metFORMIN (GLUCOPHAGE) 1000 MG tablet Take 1 tablet (1,000 mg total) by mouth 2 (two) times daily. Hold for 48 hours, restart on 02/10/2014. 02/07/14   Joline Salt Barrett, PA-C  metoprolol succinate (TOPROL-XL) 25 MG 24 hr tablet Take 1 tablet (25 mg total) by mouth 2 (two) times daily. 08/30/14   Tonny Bollman, MD  Multiple Vitamin (MULTIVITAMIN) tablet Take 1 tablet by mouth daily.      Historical Crawford Tamura, MD  oxyCODONE-acetaminophen (PERCOCET/ROXICET) 5-325 MG per tablet Take 1-2 tablets by mouth every 4 (four) hours as needed for moderate pain. 03/12/14   Charlynne Pander, DDS  polyethylene glycol (MIRALAX / Ethelene Hal) packet Take 17 g by mouth daily as needed for moderate constipation. 04/09/14   Rhonda G Barrett, PA-C  potassium chloride (KLOR-CON) 20 MEQ packet Take 40 mEq by mouth daily. 11/13/14   Laqueta Linden, MD  potassium chloride SA (KLOR-CON M20) 20 MEQ tablet Take 1 tablet (20 mEq total) by mouth daily. 11/12/14   Laqueta Linden, MD  predniSONE (DELTASONE) 5 MG tablet Take 5 mg by mouth daily with breakfast.    Historical Littleton Haub, MD  torsemide (DEMADEX) 20 MG tablet Take 1 tablet (20 mg total) by mouth 2 (two) times daily. 11/12/14   Laqueta Linden, MD    Family History  Family History  Problem Relation Age of Onset  . Hypertension Mother   . Cancer Father   . Stroke Brother   . Liver cancer Paternal Grandfather   . Rheum arthritis Maternal Aunt   . Asthma Child   . Emphysema Brother     smoked    Social History  Social History    Social History  . Marital Status: Married    Spouse Name: N/A  . Number of Children: N/A  . Years of Education: N/A   Occupational History  . Retired Special educational needs teacher  Social History Main Topics  . Smoking status: Former Smoker -- 3.00 packs/day for 35 years    Types: Cigarettes    Start date: 09/15/1961    Quit date: 11/23/1996  . Smokeless tobacco: Current User    Types: Chew     Comment: chews 2-3 packs per week  . Alcohol Use: No  . Drug Use: No  . Sexual Activity: Not on file   Other Topics Concern  . Not on file   Social History Narrative   Married, retired.      Review of Systems General:  No chills, fever, night sweats or weight changes.  Cardiovascular:  No chest pain, dyspnea on exertion, edema, orthopnea, palpitations, paroxysmal nocturnal dyspnea. Dermatological: No rash, lesions/masses Respiratory: No cough, dyspnea Urologic: No hematuria, dysuria Abdominal:   No nausea, vomiting, diarrhea, bright red blood per rectum, melena, or hematemesis Neurologic:  No visual changes, wkns, changes in mental status. All other systems reviewed and are otherwise negative except as noted above.  Physical Exam  Blood pressure 102/45, temperature 97.6 F (36.4 C), temperature source Oral, resp. rate 18, height  (1.803 m), weight 251 lb 8 oz (114.08 kg), SpO2 97 %.  General: Pleasant, NAD Psych: Normal affect. Neuro: Alert and oriented X 3. Moves all extremities spontaneously. HEENT: Normal  Neck: Supple without bruits or JVD. Lungs:  Decreased BS at the bases Heart: irregularly irregular rhythm, bradicardia Abdomen: Soft, non-tender, non-distended, BS + x 4.  Extremities: No clubbing, or cyanosis. 2+ bilateral pitting edema. DP/PT/Radials 2+ and equal bilaterally.  Labs  Troponin (Point of Care Test) No results for input(s): TROPIPOC in the last 72 hours. No results for input(s): CKTOTAL, CKMB, TROPONINI in the last 72 hours. Lab Results  Component Value  Date   WBC 4.4 11/02/2014   HGB 13.1 11/02/2014   HCT 41.0 11/02/2014   MCV 97.6 11/02/2014   PLT 102* 11/02/2014   No results for input(s): NA, K, CL, CO2, BUN, CREATININE, CALCIUM, PROT, BILITOT, ALKPHOS, ALT, AST, GLUCOSE in the last 168 hours.  Invalid input(s): LABALBU No results found for: CHOL, HDL, LDLCALC, TRIG No results found for: DDIMER   Radiology/Studies  No results found.  ECG  Atrial fibrillation with a SVR: HR in the 30s-40s.   ASSESSMENT AND PLAN 1. Symptomatic bradycardia:  patient notes a 1 day history of weakness, dizziness, near syncope and dyspnea on exertion. His heart rate at outside hospital and here at [facility] has been low in the 30s to 40s. He is fairly a symptomatically at rest. Will continue on telemetry. Will hold all home AV nodal blocking agents including Cardizem and Metroprolol. We will replete potassium and also check magnesium and TSH. Continue to cycle cardiac enzymes 3.  If his heart rate continues to remain low despite removal of AV nodal blocking agents and if there are no reversible causes identified, we'll consult EP in the a.m. to evaluate for possible permeant pacemaker. Will make NPO at midnight.   2. Atrial fibrillation: Currently in a slow ventricular response. We'll hold Cardizem and Metroprolol given heart rate in the 30s to 40s. Given his history of cirrhosis with esophageal varices, he is not a candidate for anticoagulation. Continue aspirin and Plavix.   3. Acute kidney injury:  creatinine at outside hospital was 2.66. This is up from 1.05 during time of his previous hospitalization one month ago. Hold metformin. He does not appear to be on an Ace or ARB. He has been on 20 mg  of torsemide twice a day. Based on med list, it appears that he is no longer on metolazone.  Hold diuretics for now and reassess BMP in the am. In the setting of marked bradycardia, this may be due to hypoperfusion to the kidneys.   4. Acute on chronic diastolic  CHF: patient with notable 2+ bilateral pitting edema and elevated BNP  at 1900. He notes dyspnea on exertion but this is also in the setting of marked bradycardia. He denies any resting dyspnea, orthopnea or PND. He needs diuresis however renal function is abnormal with serum creatinine of 2.66. Will defer initiation of IV diuretic to MD.   5. CAD: Status post CABG 5 in 1998.  Recent left heart catheterization October 2015 revealed patent LIMA to LAD and patent saphenous vein grafts to the RCA, OM1 and OM 2.  Vein graft to the diagonal was patent but with 80% proximal stenosis. He denies any recurrent chest pain. However, given his bradycardia and dyspnea on exertion, we'll continue to cycle cardiac enzymes x 3 to rule out silent ischemia/acute coronary syndrome.  6. H/O AS: s/p TAVR 04/2014. F/u 2-D echo 05/2013 revealed normal functioning prosthesis.   7. HTN: controlled. Holding Cardizem and metoprolol due to bradycardia. Monitor closely.   8: DM: hold metformin given AKI. Will use SSI if needed.   9. Cirrhosis: with known esophageal varices. Will use SCDs for VTE prophylaxis.    Signed, Robbie Lis, PA-C 12/26/2014, 5:25 PM  Patient seen, examined. Available data reviewed. Agree with findings, assessment, and plan as outlined by Robbie Lis, PA-C. The patient is independently interviewed and examined. He is well known to me from a TAVR procedure in December 2015. The patient has multiple medical and cardiovascular problems. He presents now with a 24-hour history of dizziness, weakness, and shortness of breath. He was evaluated in the [facility] emergency department and found to have atrial fibrillation with slow ventricular rate in the 30s. He has been on a beta blocker and calcium channel blocker. The patient has not been managed with oral anticoagulation because of cirrhosis and esophageal varices. On examination, he is a pleasant male in no acute distress. Lungs are clear  bilaterally. JVP is mildly elevated. Heart is bradycardic and regular with a grade 2/6 systolic murmur at the left sternal border. There is 2+ pretibial edema.  In summary, this patient has symptomatic slow atrial fibrillation. His beta blocker and calcium channel blocker will be held. I suspect he will need a permanent pacemaker. EP will be consulted in the morning. The patient has acute kidney injury possibly secondary to hypoperfusion/ATN with signs of congestive heart failure but likely related to his bradycardia arrhythmia. Even though he has some exam evidence of volume overload, he is in no respiratory distress. Would hold his diuretics this evening and repeat his lab work in the morning. Will update his 2D Echocardiogram to assess valvular and LV function. The patient will be Nothing by mouth after midnight in case he requires a permanent pacemaker tomorrow.  Otherwise as outlined in the note above.  Tonny Bollman, M.D. 12/26/2014 6:28 PM

## 2014-12-27 ENCOUNTER — Inpatient Hospital Stay (HOSPITAL_COMMUNITY): Payer: Medicare Other

## 2014-12-27 ENCOUNTER — Encounter (HOSPITAL_COMMUNITY): Payer: Self-pay | Admitting: Internal Medicine

## 2014-12-27 ENCOUNTER — Encounter (HOSPITAL_COMMUNITY)
Admission: AD | Disposition: A | Discharge status: Home / Self Care | Payer: Medicare Other | Source: Other Acute Inpatient Hospital | Attending: Cardiovascular Disease

## 2014-12-27 DIAGNOSIS — I482 Chronic atrial fibrillation: Secondary | ICD-10-CM

## 2014-12-27 DIAGNOSIS — I442 Atrioventricular block, complete: Principal | ICD-10-CM

## 2014-12-27 HISTORY — PX: EP IMPLANTABLE DEVICE: SHX172B

## 2014-12-27 LAB — BASIC METABOLIC PANEL
Anion gap: 12 (ref 5–15)
BUN: 37 mg/dL — AB (ref 6–20)
CHLORIDE: 93 mmol/L — AB (ref 101–111)
CO2: 25 mmol/L (ref 22–32)
Calcium: 8.4 mg/dL — ABNORMAL LOW (ref 8.9–10.3)
Creatinine, Ser: 4.02 mg/dL — ABNORMAL HIGH (ref 0.61–1.24)
GFR calc Af Amer: 16 mL/min — ABNORMAL LOW (ref >60)
GFR calc non Af Amer: 14 mL/min — ABNORMAL LOW (ref >60)
GLUCOSE: 147 mg/dL — AB (ref 65–99)
POTASSIUM: 3.2 mmol/L — AB (ref 3.5–5.1)
Sodium: 130 mmol/L — ABNORMAL LOW (ref 135–145)

## 2014-12-27 LAB — GLUCOSE, CAPILLARY
GLUCOSE-CAPILLARY: 158 mg/dL — AB (ref 65–99)
Glucose-Capillary: 155 mg/dL — ABNORMAL HIGH (ref 65–99)
Glucose-Capillary: 183 mg/dL — ABNORMAL HIGH (ref 65–99)
Glucose-Capillary: 141 mg/dL — ABNORMAL HIGH (ref 65–99)

## 2014-12-27 LAB — CBC
HEMATOCRIT: 35.6 % — AB (ref 39.0–52.0)
Hemoglobin: 11.8 g/dL — ABNORMAL LOW (ref 13.0–17.0)
MCH: 31.9 pg (ref 26.0–34.0)
MCHC: 33.1 g/dL (ref 30.0–36.0)
MCV: 96.2 fL (ref 78.0–100.0)
Platelets: 70 10*3/uL — ABNORMAL LOW (ref 150–400)
RBC: 3.7 MIL/uL — ABNORMAL LOW (ref 4.22–5.81)
RDW: 16.9 % — AB (ref 11.5–15.5)
WBC: 7.4 10*3/uL (ref 4.0–10.5)

## 2014-12-27 LAB — TROPONIN I
Troponin I: 0.3 ng/mL — ABNORMAL HIGH (ref <0.031)
Troponin I: 0.34 ng/mL — ABNORMAL HIGH (ref <0.031)

## 2014-12-27 SURGERY — PACEMAKER IMPLANT
Anesthesia: LOCAL

## 2014-12-27 MED ORDER — FENTANYL CITRATE (PF) 100 MCG/2ML IJ SOLN
INTRAMUSCULAR | Status: AC
  Filled 2014-12-27: qty 4

## 2014-12-27 MED ORDER — SODIUM CHLORIDE 0.9 % IR SOLN
80.0000 mg | Status: DC
  Filled 2014-12-27: qty 2

## 2014-12-27 MED ORDER — CEFAZOLIN SODIUM-DEXTROSE 2-3 GM-% IV SOLR
2.0000 g | INTRAVENOUS | Status: DC
  Filled 2014-12-27: qty 50

## 2014-12-27 MED ORDER — SODIUM CHLORIDE 0.9 % IR SOLN
Status: AC
  Filled 2014-12-27: qty 2

## 2014-12-27 MED ORDER — CEFAZOLIN SODIUM 1-5 GM-% IV SOLN
1.0000 g | Freq: Three times a day (TID) | INTRAVENOUS | Status: AC
  Administered 2014-12-27 – 2014-12-28 (×3): 1 g via INTRAVENOUS
  Filled 2014-12-27 (×3): qty 50

## 2014-12-27 MED ORDER — CEFAZOLIN SODIUM-DEXTROSE 2-3 GM-% IV SOLR
INTRAVENOUS | Status: AC
  Filled 2014-12-27: qty 50

## 2014-12-27 MED ORDER — SODIUM CHLORIDE 0.9 % IV SOLN
INTRAVENOUS | Status: DC
Start: 2014-12-27 — End: 2014-12-27
  Administered 2014-12-27: 09:00:00 via INTRAVENOUS

## 2014-12-27 MED ORDER — HEPARIN (PORCINE) IN NACL 2-0.9 UNIT/ML-% IJ SOLN
INTRAMUSCULAR | Status: AC
  Filled 2014-12-27: qty 500

## 2014-12-27 MED ORDER — ACETAMINOPHEN 325 MG PO TABS: 325.0000 mg | ORAL_TABLET | ORAL | Status: DC | PRN

## 2014-12-27 MED ORDER — CEFAZOLIN SODIUM-DEXTROSE 2-3 GM-% IV SOLR
INTRAVENOUS | Status: DC | PRN
  Administered 2014-12-27: 2 g via INTRAVENOUS

## 2014-12-27 MED ORDER — FENTANYL CITRATE (PF) 100 MCG/2ML IJ SOLN
INTRAMUSCULAR | Status: DC | PRN
  Administered 2014-12-27 (×2): 12.5 ug via INTRAVENOUS

## 2014-12-27 MED ORDER — ONDANSETRON HCL 4 MG/2ML IJ SOLN: 4.0000 mg | Freq: Four times a day (QID) | INTRAMUSCULAR | Status: DC | PRN

## 2014-12-27 MED ORDER — MIDAZOLAM HCL 5 MG/5ML IJ SOLN
INTRAMUSCULAR | Status: AC
  Filled 2014-12-27: qty 25

## 2014-12-27 MED ORDER — LIDOCAINE HCL (PF) 1 % IJ SOLN
INTRAMUSCULAR | Status: AC
  Filled 2014-12-27: qty 60

## 2014-12-27 SURGICAL SUPPLY — 6 items
CABLE SURGICAL S-101-97-12 (CABLE) ×2 IMPLANT
LEAD CAPSURE NOVUS 5076-58CM (Lead) ×2 IMPLANT
PAD DEFIB LIFELINK (PAD) ×2 IMPLANT
PPM SENSIA SR SESR01 (Pacemaker) ×2 IMPLANT
SHEATH CLASSIC 7F (SHEATH) ×2 IMPLANT
TRAY PACEMAKER INSERTION (CUSTOM PROCEDURE TRAY) ×2 IMPLANT

## 2014-12-27 NOTE — Interval H&P Note (Signed)
History and Physical Interval Note:  12/27/2014 9:38 AM  [name]  has presented today for surgery, with the diagnosis of heart block  The various methods of treatment have been discussed with the patient and family. After consideration of risks, benefits and other options for treatment, the patient has consented to  Procedure(s): Pacemaker Implant (N/A) as a surgical intervention .  The patient's history has been reviewed, patient examined, no change in status, stable for surgery.  I have reviewed the patient's chart and labs.  Questions were answered to the patient's satisfaction.     Lewayne Bunting

## 2014-12-27 NOTE — Consult Note (Signed)
Reason for Consult:symptomatic bradycardia due to complete heart block  Referring Physician: Dr. Sidney Ace is an 69 y.o. male.   HPI: The patient is a 69 yo man with multiple medical problems as noted below who presented to [facility] hospital with worsening CHF symptoms and was found to be in complete heart block in the setting of chronic atrial fib. I have reviewed his ECG's from the past two years. He has had atrial flutter then fibrillation with an RVR since with HR's over a 100/min at rest. His dose of medications has not been changed and now he has developed symptomatic bradycardia with LBBB. His AV nodal blocking drugs have been held, cardizem for 24 hours and metoprolol for 36 hours, and he has persistent CHB. He is symptomatic with sob. He is referred for PPM insertion. No frank syncope.  PMH: Past Medical History  Diagnosis Date  . COPD (chronic obstructive pulmonary disease)   . Elevated liver function tests   . Hypertension   . Aortic stenosis     a. 2D ECHO: 01/30/2014: ef 50-55%. No RWMAs. Severe BH:ALPFX area (VTI): 0.82 cm^2. Valve area (Vmax): 0.74 cm^2. Valve area (Vmean): 0.75 Cm^2. Mod LA/RA dilation. PA peak pressure 33  . CAD (coronary artery disease)   . GERD (gastroesophageal reflux disease)   . Chronic atrial flutter   . 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 Morbid obesity   . Coronary atherosclerosis of artery bypass graft 2010  . Acute on chronic diastolic heart failure 02/01/2014  . Chronic diastolic congestive heart failure 02/01/2014  . Epistaxis     Developed on Xarelto therapy  . Cirrhosis 02/01/2014  . Portal hypertension 02/01/2014  . Chronic pancreatitis 02/01/2014    Noted on CT scan  . Shortness of breath   . Dizziness   . Heart murmur   . Asthma     uses nebulizers daily  . Dysrhythmia     fib/flutter  . Anemia     was on xalerto sept  2015 for afib - was taken off of - had to get unit of prbc  . History of blood transfusion 01/2014    "right after starting Xeralto"  . 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 Complication of anesthesia     "has a hard time breathing when he wakes up"  . Hyperlipidemia   . Pneumonia "several times:  . Type II diabetes mellitus   . Rheumatoid arthritis(714.0)     "everywhere"  . Degenerative disc disease, cervical   . Degenerative disc disease, lumbar 2010  . Osteoarthritis     "everywhere"  . Chronic back pain     "lower back usually; upper back fior the last couple weeks"  (12/26/2014)  . ARF (acute renal failure) 11/2014  . On home oxygen therapy     "2.5L at night & prn during the day" (12/26/2014)  . Other myositis, left thigh dx'd 12/26/2014    PSHX: Past Surgical History  Procedure Laterality Date  . Coronary angioplasty with stent placement  01/02/2009    s/p bare metal stent to the vein graft to the RCA 9/20. nml LV function   . Appendectomy  1950  . Coronary artery bypass graft  03/27/1997  CABG x 5 by Dr Cornelius Moras  . Anterior cervical decomp/discectomy fusion  12/2006  . Tonsillectomy and adenoidectomy  1950  . Lumbar disc surgery  2011    "put plastic wedge in"  . Esophagogastroduodenoscopy (egd) with propofol N/A 02/01/2014    Procedure: ESOPHAGOGASTRODUODENOSCOPY (EGD) WITH PROPOFOL;  Surgeon: Shirley Friar, MD;  Location: [facility] ENDOSCOPY;  Service: Endoscopy;  Laterality: N/A;  . Colonoscopy with propofol N/A 02/01/2014    Procedure: COLONOSCOPY WITH PROPOFOL;  Surgeon: Shirley Friar, MD;  Location: [facility] ENDOSCOPY;  Service: Endoscopy;  Laterality: N/A;  . Multiple extractions with alveoloplasty N/A 03/12/2014    Procedure: MULTIPLE EXTRACTION OF TOOTH #'S 2,6,7,8,9,10,11,14,18,20,29,31 WITH ALVEOLOPLASTY AND GROSS DEBRIDMENT OF REMAINING TEETH;   Surgeon: Charlynne Pander, DDS;  Location: [facility] OR;  Service: Oral Surgery;  Laterality: N/A;  . Back surgery    . Transcatheter aortic valve replacement, transfemoral N/A 04/03/2014    Procedure: TRANSCATHETER AORTIC VALVE REPLACEMENT, TRANSFEMORAL;  Surgeon: Purcell Nails, MD;  Location: [facility] OR;  Service: Open Heart Surgery;  Laterality: N/A;  . Intraoperative transesophageal echocardiogram N/A 04/03/2014    Procedure: INTRAOPERATIVE TRANSESOPHAGEAL ECHOCARDIOGRAM;  Surgeon: Purcell Nails, MD;  Location: [facility] OR;  Service: Open Heart Surgery;  Laterality: N/A;  . Left and right heart catheterization with coronary/graft angiogram N/A 02/05/2014    Procedure: LEFT AND RIGHT HEART CATHETERIZATION WITH Isabel Caprice;  Surgeon: Peter M Swaziland, MD;  Location: [facility] CATH LAB;  Service: Cardiovascular;  Laterality: N/A;  . Posterior laminectomy / decompression cervical spine  05/2007    FAMHX: Family History  Problem Relation Age of Onset  . Hypertension Mother   . Cancer Father   . Stroke Brother   . Liver cancer Paternal Grandfather   . Rheum arthritis Maternal Aunt   . Asthma Child   . Emphysema Brother     smoked    Social History:  reports that he quit smoking about 18 years ago. His smoking use included Cigarettes. He started smoking about 53 years ago. He has a 105 pack-year smoking history. His smokeless tobacco use includes Chew. He reports that he does not drink alcohol or use illicit drugs.  Allergies: No Known Allergies  Medications: I have reviewed the patient's current medications.  No results found.  ROS  As stated in the HPI and negative for all other systems.  Physical Exam  Vitals:Blood pressure 101/40, pulse 38, temperature 98.7 F (37.1 C), temperature source Oral, resp. rate 20, height 5\' 11"  (1.803 m), weight 251 lb 8 oz (114.08 kg), SpO2 94 %.  Well appearing NAD HEENT: Unremarkable Neck:  No JVD, no thyromegally Lymphatics:  No adenopathy Back:  No  CVA tenderness Lungs:  Clear HEART:  Regular rate rhythm, no murmurs, no rubs, no clicks Abd:  Flat, positive bowel sounds, no organomegally, no rebound, no guarding Ext:  2 plus pulses, no edema, no cyanosis, no clubbing Skin:  No rashes no nodules Neuro:  CN II through XII intact, motor grossly intact  ECG - atrial fib with LBBB and CHB  Assessment/Plan: 1. Symptomatic complete heart block 2. Atrial fib with a RVR 3. Diastolic heart failure, acute on chronic now compensated 4. CAD, s/p CABG 5. Worsening renal failure, likely ATN from low cardiac output due to bradycardia Rec: I have discussed the treatment options with the patient and his wife. The risks/benefits/goals/expectations of PPM insertion have been discussed and he wishes to proceed.  Jenan Ellegood,M.D.  TaylorMD 12/27/2014, 7:30 AM

## 2014-12-27 NOTE — H&P (View-Only) (Signed)
 Reason for Consult:symptomatic bradycardia due to complete heart block  Referring Physician: Dr. Cooper  [name] is an 69 y.o. male.   HPI: The patient is a 69 yo man with multiple medical problems as noted below who presented to [place] [facility] with worsening CHF symptoms and was found to be in complete heart block in the setting of chronic atrial fib. I have reviewed his ECG's from the past two years. He has had atrial flutter then fibrillation with an RVR since with HR's over a 100/min at rest. His dose of medications has not been changed and now he has developed symptomatic bradycardia with LBBB. His AV nodal blocking drugs have been held, cardizem for 24 hours and metoprolol for 36 hours, and he has persistent CHB. He is symptomatic with sob. He is referred for PPM insertion. No frank syncope.  PMH: Past Medical History  Diagnosis Date  . COPD (chronic obstructive pulmonary disease)   . Elevated liver function tests   . Hypertension   . Aortic stenosis     a. 2D ECHO: 01/30/2014: ef 50-55%. No RWMAs. Severe AS:Valve area (VTI): 0.82 cm^2. Valve area (Vmax): 0.74 cm^2. Valve area (Vmean): 0.75 Cm^2. Mod LA/RA dilation. PA peak pressure 33  . CAD (coronary artery disease)   . GERD (gastroesophageal reflux disease)   . Chronic atrial flutter   . 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 Morbid obesity   . Coronary atherosclerosis of artery bypass graft 2010  . Acute on chronic diastolic heart failure 02/01/2014  . Chronic diastolic congestive heart failure 02/01/2014  . Epistaxis     Developed on Xarelto therapy  . Cirrhosis 02/01/2014  . Portal hypertension 02/01/2014  . Chronic pancreatitis 02/01/2014    Noted on CT scan  . Shortness of breath   . Dizziness   . Heart murmur   . Asthma     uses nebulizers daily  . Dysrhythmia     fib/flutter  . Anemia     was on xalerto sept  2015 for afib - was taken off of - had to get unit of prbc  . History of blood transfusion 01/2014    "right after starting Xeralto"  . 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 Complication of anesthesia     "has a hard time breathing when he wakes up"  . Hyperlipidemia   . Pneumonia "several times:  . Type II diabetes mellitus   . Rheumatoid arthritis(714.0)     "everywhere"  . Degenerative disc disease, cervical   . Degenerative disc disease, lumbar 2010  . Osteoarthritis     "everywhere"  . Chronic back pain     "lower back usually; upper back fior the last couple weeks"  (12/26/2014)  . ARF (acute renal failure) 11/2014  . On home oxygen therapy     "2.5L at night & prn during the day" (12/26/2014)  . Other myositis, left thigh dx'd 12/26/2014    PSHX: Past Surgical History  Procedure Laterality Date  . Coronary angioplasty with stent placement  01/02/2009    s/p bare metal stent to the vein graft to the RCA 9/20. nml LV function   . Appendectomy  1950  . Coronary artery bypass graft  03/27/1997      CABG x 5 by Dr Cornelius Moras  . Anterior cervical decomp/discectomy fusion  12/2006  . Tonsillectomy and adenoidectomy  1950  . Lumbar disc surgery  2011    "put plastic wedge in"  . Esophagogastroduodenoscopy (egd) with propofol N/A 02/01/2014    Procedure: ESOPHAGOGASTRODUODENOSCOPY (EGD) WITH PROPOFOL;  Surgeon: Shirley Friar, MD;  Location: [facility] ENDOSCOPY;  Service: Endoscopy;  Laterality: N/A;  . Colonoscopy with propofol N/A 02/01/2014    Procedure: COLONOSCOPY WITH PROPOFOL;  Surgeon: Shirley Friar, MD;  Location: [facility] ENDOSCOPY;  Service: Endoscopy;  Laterality: N/A;  . Multiple extractions with alveoloplasty N/A 03/12/2014    Procedure: MULTIPLE EXTRACTION OF TOOTH #'S 2,6,7,8,9,10,11,14,18,20,29,31 WITH ALVEOLOPLASTY AND GROSS DEBRIDMENT OF REMAINING TEETH;   Surgeon: Charlynne Pander, DDS;  Location: [facility] OR;  Service: Oral Surgery;  Laterality: N/A;  . Back surgery    . Transcatheter aortic valve replacement, transfemoral N/A 04/03/2014    Procedure: TRANSCATHETER AORTIC VALVE REPLACEMENT, TRANSFEMORAL;  Surgeon: Purcell Nails, MD;  Location: [facility] OR;  Service: Open Heart Surgery;  Laterality: N/A;  . Intraoperative transesophageal echocardiogram N/A 04/03/2014    Procedure: INTRAOPERATIVE TRANSESOPHAGEAL ECHOCARDIOGRAM;  Surgeon: Purcell Nails, MD;  Location: [facility] OR;  Service: Open Heart Surgery;  Laterality: N/A;  . Left and right heart catheterization with coronary/graft angiogram N/A 02/05/2014    Procedure: LEFT AND RIGHT HEART CATHETERIZATION WITH Isabel Caprice;  Surgeon: Peter M Swaziland, MD;  Location: [facility] CATH LAB;  Service: Cardiovascular;  Laterality: N/A;  . Posterior laminectomy / decompression cervical spine  05/2007    FAMHX: Family History  Problem Relation Age of Onset  . Hypertension Mother   . Cancer Father   . Stroke Brother   . Liver cancer Paternal Grandfather   . Rheum arthritis Maternal Aunt   . Asthma Child   . Emphysema Brother     smoked    Social History:  reports that he quit smoking about 18 years ago. His smoking use included Cigarettes. He started smoking about 53 years ago. He has a 105 pack-year smoking history. His smokeless tobacco use includes Chew. He reports that he does not drink alcohol or use illicit drugs.  Allergies: No Known Allergies  Medications: I have reviewed the patient's current medications.  No results found.  ROS  As stated in the HPI and negative for all other systems.  Physical Exam  Vitals:Blood pressure 101/40, pulse 38, temperature 98.7 F (37.1 C), temperature source Oral, resp. rate 20, height 5\' 11"  (1.803 m), weight 251 lb 8 oz (114.08 kg), SpO2 94 %.  Well appearing NAD HEENT: Unremarkable Neck:  No JVD, no thyromegally Lymphatics:  No adenopathy Back:  No  CVA tenderness Lungs:  Clear HEART:  Regular rate rhythm, no murmurs, no rubs, no clicks Abd:  Flat, positive bowel sounds, no organomegally, no rebound, no guarding Ext:  2 plus pulses, no edema, no cyanosis, no clubbing Skin:  No rashes no nodules Neuro:  CN II through XII intact, motor grossly intact  ECG - atrial fib with LBBB and CHB  Assessment/Plan: 1. Symptomatic complete heart block 2. Atrial fib with a RVR 3. Diastolic heart failure, acute on chronic now compensated 4. CAD, s/p CABG 5. Worsening renal failure, likely ATN from low cardiac output due to bradycardia Rec: I have discussed the treatment options with the patient and his wife. The risks/benefits/goals/expectations of PPM insertion have been discussed and he wishes to proceed.  Jaydan Chretien,M.D.  TaylorMD 12/27/2014, 7:30 AM

## 2014-12-27 NOTE — Progress Notes (Signed)
Utilization review completed.  

## 2014-12-28 ENCOUNTER — Inpatient Hospital Stay (HOSPITAL_COMMUNITY): Payer: Medicare Other

## 2014-12-28 DIAGNOSIS — R0600 Dyspnea, unspecified: Secondary | ICD-10-CM

## 2014-12-28 LAB — SODIUM, URINE, RANDOM

## 2014-12-28 LAB — URINALYSIS, ROUTINE W REFLEX MICROSCOPIC
BILIRUBIN URINE: NEGATIVE
Glucose, UA: NEGATIVE mg/dL
KETONES UR: NEGATIVE mg/dL
LEUKOCYTES UA: NEGATIVE
NITRITE: NEGATIVE
PROTEIN: 100 mg/dL — AB
Specific Gravity, Urine: 1.015 (ref 1.005–1.030)
UROBILINOGEN UA: 0.2 mg/dL (ref 0.0–1.0)
pH: 5 (ref 5.0–8.0)

## 2014-12-28 LAB — BASIC METABOLIC PANEL
Anion gap: 12 (ref 5–15)
BUN: 52 mg/dL — AB (ref 6–20)
CALCIUM: 8.2 mg/dL — AB (ref 8.9–10.3)
CHLORIDE: 93 mmol/L — AB (ref 101–111)
CO2: 25 mmol/L (ref 22–32)
CREATININE: 5.85 mg/dL — AB (ref 0.61–1.24)
GFR calc Af Amer: 10 mL/min — ABNORMAL LOW (ref >60)
GFR calc non Af Amer: 9 mL/min — ABNORMAL LOW (ref >60)
Glucose, Bld: 205 mg/dL — ABNORMAL HIGH (ref 65–99)
Potassium: 4.5 mmol/L (ref 3.5–5.1)
SODIUM: 130 mmol/L — AB (ref 135–145)

## 2014-12-28 LAB — URINE MICROSCOPIC-ADD ON

## 2014-12-28 LAB — CREATININE, URINE, RANDOM: CREATININE, URINE: 116.29 mg/dL

## 2014-12-28 LAB — GLUCOSE, CAPILLARY
GLUCOSE-CAPILLARY: 149 mg/dL — AB (ref 65–99)
Glucose-Capillary: 187 mg/dL — ABNORMAL HIGH (ref 65–99)
Glucose-Capillary: 203 mg/dL — ABNORMAL HIGH (ref 65–99)
Glucose-Capillary: 152 mg/dL — ABNORMAL HIGH (ref 65–99)

## 2014-12-28 NOTE — Progress Notes (Signed)
Wife at bedside has been very concerned about pt ammonia level.  State that he has been going to PCP (in [place]) for the last month to check ammonia level, as this has caused him much trouble in the past (confusion).  He was taking lactulose daily.  LBM 8/24.  Pt A/Ox4, no signs of confusion currently.  Will ask rounding MD to address today.

## 2014-12-28 NOTE — Progress Notes (Signed)
  Echocardiogram 2D Echocardiogram has been performed.  Delcie Roch 12/28/2014, 9:27 AM

## 2014-12-28 NOTE — Consult Note (Signed)
Renal Service Consult Note [facility]  [name] 12/28/2014 [name] D Requesting Physician:  Dr Purvis Sheffield  Reason for Consult:  Acute renal failure HPI: The patient is a 69 y.o. year-old with hx of CAD/ CABG, S/p TAVR for AS, afib, diast HF, HTN, DM, cirrhosis, COPD admitted for a/c diast CHF and bradycardia on 12/26/14. He presented to [facility] with weakness, dizziness and DOE for one day. Also near syncope. EKG showed afib with HR in the 30's.  BP was stable, BNP 1900, creat 2.66 (baseline 1.0). Creat in July was 1.04. He was transferred to [facility] and PPM was placed. He now has worsening renal failure.    On admit here creat was 3.36, up to 4.02 yest and up to 5.85 today. Denies n/v/d, abd pain, SOB, cough or CP.  No joint pain , no HA or blurred vision, no confusion. +hx acute renal failure at [facility] earlier this year.   Past Medical History  Past Medical History  Diagnosis Date  . COPD (chronic obstructive pulmonary disease)   . Elevated liver function tests   . Hypertension   . Aortic stenosis     a. 2D ECHO: 01/30/2014: ef 50-55%. No RWMAs. Severe BS:JGGEZ area (VTI): 0.82 cm^2. Valve area (Vmax): 0.74 cm^2. Valve area (Vmean): 0.75 Cm^2. Mod LA/RA dilation. PA peak pressure 33  . CAD (coronary artery disease)   . GERD (gastroesophageal reflux disease)   . Chronic atrial flutter   . 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 Morbid obesity   . Coronary atherosclerosis of artery bypass graft 2010  . Acute on chronic diastolic heart failure 02/01/2014  . Chronic diastolic congestive heart failure 02/01/2014  . Epistaxis     Developed on Xarelto therapy  . Cirrhosis 02/01/2014  . Portal hypertension 02/01/2014  . Chronic pancreatitis 02/01/2014    Noted on CT scan  . Shortness of breath   . Dizziness   . Heart murmur   . Asthma     uses nebulizers  daily  . Dysrhythmia     fib/flutter  . Anemia     was on xalerto sept 2015 for afib - was taken off of - had to get unit of prbc  . History of blood transfusion 01/2014    "right after starting Xeralto"  . 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 Complication of anesthesia     "has a hard time breathing when he wakes up"  . Hyperlipidemia   . Pneumonia "several times:  . Type II diabetes mellitus   . Rheumatoid arthritis(714.0)     "everywhere"  . Degenerative disc disease, cervical   . Degenerative disc disease, lumbar 2010  . Osteoarthritis     "everywhere"  . Chronic back pain     "lower back usually; upper back fior the last couple weeks"  (12/26/2014)  . ARF (acute renal failure) 11/2014  . On home oxygen therapy     "2.5L at night & prn during the day" (12/26/2014)  . Other myositis, left thigh dx'd 12/26/2014   Past Surgical History  Past Surgical History  Procedure Laterality Date  . Coronary angioplasty with stent placement  01/02/2009    s/p bare metal stent to the vein graft to  the RCA 9/20. nml LV function   . Appendectomy  1950  . Coronary artery bypass graft  03/27/1997    CABG x 5 by Dr Cornelius Moras  . Anterior cervical decomp/discectomy fusion  12/2006  . Tonsillectomy and adenoidectomy  1950  . Lumbar disc surgery  2011    "put plastic wedge in"  . Esophagogastroduodenoscopy (egd) with propofol N/A 02/01/2014    Procedure: ESOPHAGOGASTRODUODENOSCOPY (EGD) WITH PROPOFOL;  Surgeon: Shirley Friar, MD;  Location: [facility] ENDOSCOPY;  Service: Endoscopy;  Laterality: N/A;  . Colonoscopy with propofol N/A 02/01/2014    Procedure: COLONOSCOPY WITH PROPOFOL;  Surgeon: Shirley Friar, MD;  Location: [facility] ENDOSCOPY;  Service: Endoscopy;  Laterality: N/A;  . Multiple extractions with alveoloplasty N/A 03/12/2014    Procedure: MULTIPLE EXTRACTION OF TOOTH #'S  2,6,7,8,9,10,11,14,18,20,29,31 WITH ALVEOLOPLASTY AND GROSS DEBRIDMENT OF REMAINING TEETH;  Surgeon: Charlynne Pander, DDS;  Location: [facility] OR;  Service: Oral Surgery;  Laterality: N/A;  . Back surgery    . Transcatheter aortic valve replacement, transfemoral N/A 04/03/2014    Procedure: TRANSCATHETER AORTIC VALVE REPLACEMENT, TRANSFEMORAL;  Surgeon: Purcell Nails, MD;  Location: [facility] OR;  Service: Open Heart Surgery;  Laterality: N/A;  . Intraoperative transesophageal echocardiogram N/A 04/03/2014    Procedure: INTRAOPERATIVE TRANSESOPHAGEAL ECHOCARDIOGRAM;  Surgeon: Purcell Nails, MD;  Location: [facility] OR;  Service: Open Heart Surgery;  Laterality: N/A;  . Left and right heart catheterization with coronary/graft angiogram N/A 02/05/2014    Procedure: LEFT AND RIGHT HEART CATHETERIZATION WITH Isabel Caprice;  Surgeon: Peter M Swaziland, MD;  Location: [facility] CATH LAB;  Service: Cardiovascular;  Laterality: N/A;  . Posterior laminectomy / decompression cervical spine  05/2007  . Ep implantable device N/A 12/27/2014    Procedure: Pacemaker Implant;  Surgeon: Marinus Maw, MD;  Location: [facility] INVASIVE CV LAB;  Service: Cardiovascular;  Laterality: N/A;   Family History  Family History  Problem Relation Age of Onset  . Hypertension Mother   . Cancer Father   . Stroke Brother   . Liver cancer Paternal Grandfather   . Rheum arthritis Maternal Aunt   . Asthma Child   . Emphysema Brother     smoked   Social History  reports that he quit smoking about 18 years ago. His smoking use included Cigarettes. He started smoking about 53 years ago. He has a 105 pack-year smoking history. His smokeless tobacco use includes Chew. He reports that he does not drink alcohol or use illicit drugs. Allergies No Known Allergies Home medications Prior to Admission medications   Medication Sig Start Date End Date Taking? Authorizing Provider  albuterol-ipratropium (COMBIVENT) 18-103 MCG/ACT inhaler Inhale 2 puffs into  the lungs every 6 (six) hours as needed for wheezing or shortness of breath.    Yes Historical Provider, MD  Artificial Tear Ointment (DRY EYES OP) Apply 1 drop to eye daily as needed (dry eyes).   Yes Historical Provider, MD  ascorbic acid (VITAMIN C) 500 MG tablet Take 500 mg by mouth daily.    Yes Historical Provider, MD  aspirin 81 MG tablet Take 1 tablet (81 mg total) by mouth daily. 02/07/14  Yes Rhonda G Barrett, PA-C  budesonide (PULMICORT) 0.25 MG/2ML nebulizer solution Take 0.25 mg by nebulization 2 (two) times daily.   Yes Historical Provider, MD  buprenorphine (BUTRANS) 10 MCG/HR PTWK patch Place 10 mcg onto the skin once a week.   Yes Historical Provider, MD  cetirizine (ZYRTEC) 10 MG tablet Take 10 mg by mouth  at bedtime.   Yes Historical Provider, MD  clopidogrel (PLAVIX) 75 MG tablet Take 1 tablet (75 mg total) by mouth daily. 04/11/14  Yes Laqueta Linden, MD  co-enzyme Q-10 30 MG capsule Take 30 mg by mouth at bedtime.    Yes Historical Provider, MD  desonide (DESOWEN) 0.05 % cream Apply 1 application topically 2 (two) times daily as needed (for rash).    Yes Historical Provider, MD  diltiazem (CARDIZEM CD) 120 MG 24 hr capsule Take 1 capsule (120 mg total) by mouth daily. Patient taking differently: Take 60 mg by mouth 2 (two) times daily.  11/12/14  Yes Laqueta Linden, MD  ezetimibe (ZETIA) 10 MG tablet Take 10 mg by mouth at bedtime.    Yes Historical Provider, MD  flunisolide (NASAREL) 29 MCG/ACT (0.025%) nasal spray Place 2 sprays into the nose at bedtime as needed for rhinitis (for congestion). Dose is for each nostril.   Yes Historical Provider, MD  insulin NPH-regular Human (NOVOLIN 70/30) (70-30) 100 UNIT/ML injection Inject 15 Units into the skin 2 (two) times daily. Patient taking differently: Inject 30-40 Units into the skin 2 (two) times daily. 40 as a base; <180 = 40 units 11/04/14  Yes Erick Blinks, MD  ipratropium-albuterol (DUONEB) 0.5-2.5 (3) MG/3ML SOLN  Take 3 mLs by nebulization every 6 (six) hours as needed (for shortness of breath).   Yes Historical Provider, MD  ketoconazole (NIZORAL) 2 % cream Apply 1 application topically 2 (two) times daily as needed for irritation.    Yes Historical Provider, MD  KRISTALOSE 20 G packet Take 20 g by mouth 2 (two) times daily. 12/15/14  Yes Historical Provider, MD  loratadine (CLARITIN) 10 MG tablet Take 10 mg by mouth every morning.    Yes Historical Provider, MD  metFORMIN (GLUCOPHAGE) 1000 MG tablet Take 1 tablet (1,000 mg total) by mouth 2 (two) times daily. Hold for 48 hours, restart on 02/10/2014. 02/07/14  Yes Rhonda G Barrett, PA-C  metoprolol succinate (TOPROL-XL) 25 MG 24 hr tablet Take 1 tablet (25 mg total) by mouth 2 (two) times daily. Patient taking differently: Take 25 mg by mouth daily.  08/30/14  Yes Tonny Bollman, MD  Multiple Vitamin (MULTIVITAMIN) tablet Take 1 tablet by mouth daily.     Yes Historical Provider, MD  oxyCODONE-acetaminophen (PERCOCET/ROXICET) 5-325 MG per tablet Take 1-2 tablets by mouth every 4 (four) hours as needed for moderate pain. Patient taking differently: Take 1 tablet by mouth every 4 (four) hours as needed for moderate pain.  03/12/14  Yes Charlynne Pander, DDS  potassium chloride SA (KLOR-CON M20) 20 MEQ tablet Take 1 tablet (20 mEq total) by mouth daily. Patient taking differently: Take 20 mEq by mouth 2 (two) times daily.  11/12/14  Yes Laqueta Linden, MD  predniSONE (DELTASONE) 5 MG tablet Take 5 mg by mouth daily with breakfast.   Yes Historical Provider, MD  torsemide (DEMADEX) 20 MG tablet Take 1 tablet (20 mg total) by mouth 2 (two) times daily. 11/12/14  Yes Laqueta Linden, MD  metolazone (ZAROXOLYN) 5 MG tablet Take 5 mg by mouth 2 (two) times daily. 11/05/14   Historical Provider, MD  polyethylene glycol (MIRALAX / GLYCOLAX) packet Take 17 g by mouth daily as needed for moderate constipation. Patient not taking: Reported on 12/26/2014 04/09/14    Darrol Jump, PA-C   Liver Function Tests  Recent Labs Lab 12/26/14 1928  AST 68*  ALT 38  ALKPHOS 104  BILITOT 0.9  PROT 6.5  ALBUMIN 2.9*   No results for input(s): LIPASE, AMYLASE in the last 168 hours. CBC  Recent Labs Lab 12/26/14 1928 12/27/14 0630  WBC 6.0 7.4  HGB 12.0* 11.8*  HCT 36.9* 35.6*  MCV 98.4 96.2  PLT 81* 70*   Basic Metabolic Panel  Recent Labs Lab 12/26/14 1928 12/27/14 0630 12/28/14 0900  NA 134* 130* 130*  K 4.0 3.2* 4.5  CL 92* 93* 93*  CO2 28 25 25   GLUCOSE 182* 147* 205*  BUN 33* 37* 52*  CREATININE 3.36* 4.02* 5.85*  CALCIUM 8.9 8.4* 8.2*    Filed Vitals:   12/27/14 2122 12/28/14 0538 12/28/14 0942 12/28/14 1627  BP:  117/48    Pulse:      Temp:  98.6 F (37 C)  99.1 F (37.3 C)  TempSrc:  Oral  Oral  Resp:  19    Height:      Weight:      SpO2: 99% 89% 97%    Exam Pleaseant , no distress, nasal O 2 No rash, cyanosis or gangrene Sclera anicteric, throat clear +JVD to angle of jaw Rales L base, R mostly clear RRR 2/6 SEM RUSB, no RG Abd obese, no ascites or HSM, +bs ntnd GU normal male Ext 2-3+ pitting edema below the knees bilat Neuro is alert, no asterixis nf   CXR - possible mild vasc congestion vs chronic changes ECHO 8/27 > LVEF 55%, s/p TAVR, mild pulm HTN  Assessment: 1. Acute renal failure - oliguric but starting to make urine. In setting of severe bradycardia/ heart block and suspected hypoperfusion. Vol excess on exam w/o resp issues. BP's have been soft.  No ACEi or ARB implicated. Has PPM in now and BP a little better and making urine.  Not uremic.  Plan UA, urine lytes and follow vol status, do HD if patient wants to and needs to.  Optimistic about recovery at this point at least.  2. CHB s/p PPM 3. CAD hx CABG 4. COPD home O2 5. AS s/p TAVR 6. DM2 7. DJD/ RA   Plan- as above.   9/27 MD (pgr) [phone]    [phone] 12/28/2014, 8:42 PM

## 2014-12-28 NOTE — Progress Notes (Signed)
Patient ID: KOAH CHISENHALL, male   DOB: 10/30/1945, 69 y.o.   MRN: [id number]    Patient Name: [name] Date of Encounter: 12/28/2014     Active Problems:   Symptomatic bradycardia    SUBJECTIVE  No chest pain or sob.   CURRENT MEDS . antiseptic oral rinse  7 mL Mouth Rinse BID  . arformoterol  15 mcg Nebulization Q12H  . aspirin  81 mg Oral Daily  . budesonide  0.25 mg Nebulization BID  . ezetimibe  10 mg Oral QHS  . insulin aspart  0-9 Units Subcutaneous TID WC  . potassium chloride  40 mEq Oral Daily  . predniSONE  5 mg Oral Q breakfast    OBJECTIVE  Filed Vitals:   12/27/14 2033 12/27/14 2122 12/28/14 0538 12/28/14 0942  BP: 120/51  117/48   Pulse: 53     Temp: 99 F (37.2 C)  98.6 F (37 C)   TempSrc: Oral  Oral   Resp: 23  19   Height:      Weight:      SpO2: 98% 99% 89% 97%    Intake/Output Summary (Last 24 hours) at 12/28/14 1338 Last data filed at 12/27/14 1700  Gross per 24 hour  Intake    240 ml  Output      0 ml  Net    240 ml   Filed Weights   12/26/14 1652  Weight: 251 lb 8 oz (114.08 kg)    PHYSICAL EXAM  General: Pleasant, NAD. Neuro: Alert and oriented X 3. Moves all extremities spontaneously. Psych: Normal affect. HEENT:  Normal  Neck: Supple without bruits or JVD. Lungs:  Resp regular and unlabored, CTA. Heart: RRR no s3, s4, or murmurs. Abdomen: Soft, non-tender, non-distended, BS + x 4.  Extremities: No clubbing, cyanosis or edema. DP/PT/Radials 2+ and equal bilaterally.  Accessory Clinical Findings  CBC  Recent Labs  12/26/14 1928 12/27/14 0630  WBC 6.0 7.4  HGB 12.0* 11.8*  HCT 36.9* 35.6*  MCV 98.4 96.2  PLT 81* 70*   Basic Metabolic Panel  Recent Labs  12/26/14 1928 12/27/14 0630 12/28/14 0900  NA 134* 130* 130*  K 4.0 3.2* 4.5  CL 92* 93* 93*  CO2 28 25 25   GLUCOSE 182* 147* 205*  BUN 33* 37* 52*  CREATININE 3.36* 4.02* 5.85*  CALCIUM 8.9 8.4* 8.2*  MG 1.9  --   --    Liver Function  Tests  Recent Labs  12/26/14 1928  AST 68*  ALT 38  ALKPHOS 104  BILITOT 0.9  PROT 6.5  ALBUMIN 2.9*   No results for input(s): LIPASE, AMYLASE in the last 72 hours. Cardiac Enzymes  Recent Labs  12/26/14 1928 12/26/14 2324 12/27/14 0630  TROPONINI 0.33* 0.30* 0.34*   BNP Invalid input(s): POCBNP D-Dimer No results for input(s): DDIMER in the last 72 hours. Hemoglobin A1C No results for input(s): HGBA1C in the last 72 hours. Fasting Lipid Panel No results for input(s): CHOL, HDL, LDLCALC, TRIG, CHOLHDL, LDLDIRECT in the last 72 hours. Thyroid Function Tests  Recent Labs  12/26/14 1928  TSH 1.836    TELE  Atrial fib with ventricular pacing  Radiology/Studies  Dg Chest 2 View  12/28/2014   CLINICAL DATA:  69 year old male status post pacemaker placement.  EXAM: CHEST  2 VIEW  COMPARISON:  Chest x-ray 12/26/2014.  FINDINGS: New left-sided pacemaker device in position with lead tip projecting over the expected location of the right ventricular apex.  Status post median sternotomy for CABG, including LIMA. Post procedural changes of TAVR are noted, with what appears to be an Edwards Sapien 3 valve in position. No pneumothorax. No consolidative airspace disease. Mild diffuse interstitial prominence. Small bilateral pleural effusions. Cephalization of pulmonary vasculature. Cardiopericardial silhouette appears borderline enlarged. Upper mediastinal contours are within normal limits allowing for lordotic positioning the patient. Orthopedic fixation hardware in the lower cervical spine.  IMPRESSION: 1. Status post left-sided pacemaker placement, without pneumothorax or other definite acute complicating features. 2. Cardiopericardial silhouette does appear borderline enlarged, and has a slightly different appearance on today's examination than prior studies. The possibility of a pericardial effusion is not excluded, and attention on followup studies is recommended. This may simply  be projectional on today's examination. 3. Mild interstitial pulmonary edema. 4. Additional postoperative and post procedural changes, as above.   Electronically Signed   By: Trudie Reed M.D.   On: 12/28/2014 08:21    ASSESSMENT AND PLAN  1. Chronic atrial fib 2. Complete heart block 3. Worsening renal insufficiency 4. S/p PPM insertion Rec: The patient feels better but his renal function continues to decline. He has not received any IV contrast. His PPM is working [employer]. Initially I thought his worsening renal dysfunction was related to low output state from his complete heart block. He was not particularly hypotensive however. I will ask our nephrologist to see the patient.  Gregg Taylor,M.D.  12/28/2014 1:38 PM

## 2014-12-29 LAB — BASIC METABOLIC PANEL
Anion gap: 11 (ref 5–15)
BUN: 60 mg/dL — AB (ref 6–20)
CHLORIDE: 92 mmol/L — AB (ref 101–111)
CO2: 23 mmol/L (ref 22–32)
Calcium: 8.2 mg/dL — ABNORMAL LOW (ref 8.9–10.3)
Creatinine, Ser: 5.32 mg/dL — ABNORMAL HIGH (ref 0.61–1.24)
GFR calc Af Amer: 11 mL/min — ABNORMAL LOW (ref >60)
GFR calc non Af Amer: 10 mL/min — ABNORMAL LOW (ref >60)
GLUCOSE: 223 mg/dL — AB (ref 65–99)
POTASSIUM: 4.1 mmol/L (ref 3.5–5.1)
Sodium: 126 mmol/L — ABNORMAL LOW (ref 135–145)

## 2014-12-29 LAB — GLUCOSE, CAPILLARY
GLUCOSE-CAPILLARY: 198 mg/dL — AB (ref 65–99)
GLUCOSE-CAPILLARY: 152 mg/dL — AB (ref 65–99)
GLUCOSE-CAPILLARY: 162 mg/dL — AB (ref 65–99)
Glucose-Capillary: 169 mg/dL — ABNORMAL HIGH (ref 65–99)

## 2014-12-29 LAB — AMMONIA: Ammonia: 44 umol/L — ABNORMAL HIGH (ref 9–35)

## 2014-12-29 MED ORDER — LACTULOSE 10 GM/15ML PO SOLN
20.0000 g | Freq: Every day | ORAL | Status: DC
  Administered 2014-12-29 – 2014-12-31 (×3): 20 g via ORAL
  Filled 2014-12-29 (×3): qty 30

## 2014-12-29 NOTE — Progress Notes (Signed)
Patient ID: DETROIT FRIEDEN, male   DOB: 1945/09/24, 69 y.o.   MRN: [id number]    Patient Name: [name] Date of Encounter: 12/29/2014     Active Problems:   Symptomatic bradycardia    SUBJECTIVE  Making urine, feeling better, still constipated  CURRENT MEDS . antiseptic oral rinse  7 mL Mouth Rinse BID  . arformoterol  15 mcg Nebulization Q12H  . aspirin  81 mg Oral Daily  . budesonide  0.25 mg Nebulization BID  . ezetimibe  10 mg Oral QHS  . insulin aspart  0-9 Units Subcutaneous TID WC  . potassium chloride  40 mEq Oral Daily  . predniSONE  5 mg Oral Q breakfast    OBJECTIVE  Filed Vitals:   12/28/14 2044 12/28/14 2100 12/29/14 0431 12/29/14 0935  BP:  109/47    Pulse:  60    Temp:  98.7 F (37.1 C) 98.4 F (36.9 C)   TempSrc:   Oral   Resp:  22    Height:      Weight:   260 lb 9.6 oz (118.207 kg)   SpO2: 96% 98%  97%    Intake/Output Summary (Last 24 hours) at 12/29/14 1018 Last data filed at 12/29/14 0800  Gross per 24 hour  Intake      0 ml  Output    875 ml  Net   -875 ml   Filed Weights   12/26/14 1652 12/29/14 0431  Weight: 251 lb 8 oz (114.08 kg) 260 lb 9.6 oz (118.207 kg)    PHYSICAL EXAM  General: Pleasant, NAD. Neuro: Alert and oriented X 3. Moves all extremities spontaneously. Psych: Normal affect. HEENT:  Normal  Neck: Supple without bruit,  JVD - 7 cm. Lungs:  Resp regular and unlabored, CTA. Heart: RRR no s3, s4, or murmurs. Abdomen: Soft, non-tender, non-distended, BS + x 4.  Extremities: No clubbing, cyanosis or edema. DP/PT/Radials 2+ and equal bilaterally.  Accessory Clinical Findings  CBC  Recent Labs  12/26/14 1928 12/27/14 0630  WBC 6.0 7.4  HGB 12.0* 11.8*  HCT 36.9* 35.6*  MCV 98.4 96.2  PLT 81* 70*   Basic Metabolic Panel  Recent Labs  12/26/14 1928 12/27/14 0630 12/28/14 0900  NA 134* 130* 130*  K 4.0 3.2* 4.5  CL 92* 93* 93*  CO2 28 25 25   GLUCOSE 182* 147* 205*  BUN 33* 37* 52*    CREATININE 3.36* 4.02* 5.85*  CALCIUM 8.9 8.4* 8.2*  MG 1.9  --   --    Liver Function Tests  Recent Labs  12/26/14 1928  AST 68*  ALT 38  ALKPHOS 104  BILITOT 0.9  PROT 6.5  ALBUMIN 2.9*   No results for input(s): LIPASE, AMYLASE in the last 72 hours. Cardiac Enzymes  Recent Labs  12/26/14 1928 12/26/14 2324 12/27/14 0630  TROPONINI 0.33* 0.30* 0.34*   BNP Invalid input(s): POCBNP D-Dimer No results for input(s): DDIMER in the last 72 hours. Hemoglobin A1C No results for input(s): HGBA1C in the last 72 hours. Fasting Lipid Panel No results for input(s): CHOL, HDL, LDLCALC, TRIG, CHOLHDL, LDLDIRECT in the last 72 hours. Thyroid Function Tests  Recent Labs  12/26/14 1928  TSH 1.836    TELE  Atrial fib with ventricular pacing  Radiology/Studies  Dg Chest 2 View  12/28/2014   CLINICAL DATA:  69 year old male status post pacemaker placement.  EXAM: CHEST  2 VIEW  COMPARISON:  Chest x-ray 12/26/2014.  FINDINGS: New left-sided pacemaker  device in position with lead tip projecting over the expected location of the right ventricular apex. Status post median sternotomy for CABG, including LIMA. Post procedural changes of TAVR are noted, with what appears to be an Edwards Sapien 3 valve in position. No pneumothorax. No consolidative airspace disease. Mild diffuse interstitial prominence. Small bilateral pleural effusions. Cephalization of pulmonary vasculature. Cardiopericardial silhouette appears borderline enlarged. Upper mediastinal contours are within normal limits allowing for lordotic positioning the patient. Orthopedic fixation hardware in the lower cervical spine.  IMPRESSION: 1. Status post left-sided pacemaker placement, without pneumothorax or other definite acute complicating features. 2. Cardiopericardial silhouette does appear borderline enlarged, and has a slightly different appearance on today's examination than prior studies. The possibility of a pericardial  effusion is not excluded, and attention on followup studies is recommended. This may simply be projectional on today's examination. 3. Mild interstitial pulmonary edema. 4. Additional postoperative and post procedural changes, as above.   Electronically Signed   By: Trudie Reed M.D.   On: 12/28/2014 08:21    ASSESSMENT AND PLAN  1. Complete heart block 2. s/p PPM insertion 3. Acute renal failure - non-oliguric 4. Cirrhosis - normally takes lactulose at home 5. Chronic atrial fibrillation Rec: will start lactulose as he has been constipated. Hopefully his kidney function will improve. DC home tomorrow if making urine and renal function nearing baseline, and constipation has resolved.   Gregg Taylor,M.D.  12/29/2014 10:18 AM

## 2014-12-29 NOTE — Progress Notes (Signed)
  [facility] Progress Note   Subjective: no SOB or CP, making urine 500-600/ day and creat down slightly today  Filed Vitals:   12/28/14 2044 12/28/14 2100 12/29/14 0431 12/29/14 0935  BP:  109/47    Pulse:  60    Temp:  98.7 F (37.1 C) 98.4 F (36.9 C)   TempSrc:   Oral   Resp:  22    Height:      Weight:   118.207 kg (260 lb 9.6 oz)   SpO2: 96% 98%  97%   Exam: Pleaseant , no distress, nasal O 2 Mild jvd Rales L base, R mostly clear RRR 2/6 SEM RUSB, no RG Abd obese, no ascites or HSM, +bs ntnd GU normal male Ext 2-3+ pitting edema below the knees bilat Neuro is alert, no asterixis nf   CXR - possible mild vasc congestion vs chronic changes ECHO 8/27 > LVEF 55%, s/p TAVR, mild pulm HTN  Assessment: 1. Acute renal failure - getting better today, creat down and making more urine.  Low UNa could be HRS or diast/ RHF related. BP's low normal.  For now cont supportive care, hopefully will continue to improve. With advanced liver disease and other comorbidities he is not a great candidate for dialysis and would be unlikely to improve QOL on hemodialyisis. I expressed this to him and his wife.  Discussed with patient who deferred most questions related to EOL/ aggressiveness of therapy to his wife. He has a living will already, wife said, and she is aware that DNR is the next decision point and they are considering their options.  2. Cirrhosis - advanced disease w portal HTN on CT and encephalopathy/ [facility] under active treatment.  3. CHB s/p PPM 4. CAD hx CABG 5. COPD home O2 6. AS s/p TAVR 7. DM2 8. DJD/ RA  Plan - as above, check labs in am    Vinson Moselle MD  pager [phone]    cell [phone]  12/29/2014, 1:33 PM     Recent Labs Lab 12/27/14 0630 12/28/14 0900 12/29/14 1005  NA 130* 130* 126*  K 3.2* 4.5 4.1  CL 93* 93* 92*  CO2 25 25 23   GLUCOSE 147* 205* 223*  BUN 37* 52* 60*  CREATININE 4.02* 5.85* 5.32*  CALCIUM 8.4* 8.2* 8.2*     Recent Labs Lab 12/26/14 1928  AST 68*  ALT 38  ALKPHOS 104  BILITOT 0.9  PROT 6.5  ALBUMIN 2.9*    Recent Labs Lab 12/26/14 1928 12/27/14 0630  WBC 6.0 7.4  HGB 12.0* 11.8*  HCT 36.9* 35.6*  MCV 98.4 96.2  PLT 81* 70*   . antiseptic oral rinse  7 mL Mouth Rinse BID  . arformoterol  15 mcg Nebulization Q12H  . aspirin  81 mg Oral Daily  . budesonide  0.25 mg Nebulization BID  . ezetimibe  10 mg Oral QHS  . insulin aspart  0-9 Units Subcutaneous TID WC  . lactulose  20 g Oral Daily  . potassium chloride  40 mEq Oral Daily  . predniSONE  5 mg Oral Q breakfast     acetaminophen, ipratropium-albuterol, ondansetron (ZOFRAN) IV, oxyCODONE-acetaminophen

## 2014-12-30 ENCOUNTER — Ambulatory Visit: Payer: Self-pay | Admitting: Cardiovascular Disease

## 2014-12-30 ENCOUNTER — Encounter: Payer: Self-pay | Admitting: Cardiovascular Disease

## 2014-12-30 ENCOUNTER — Encounter (HOSPITAL_COMMUNITY): Payer: Self-pay | Admitting: Cardiology

## 2014-12-30 DIAGNOSIS — R778 Other specified abnormalities of plasma proteins: Secondary | ICD-10-CM

## 2014-12-30 DIAGNOSIS — K5900 Constipation, unspecified: Secondary | ICD-10-CM

## 2014-12-30 DIAGNOSIS — I4891 Unspecified atrial fibrillation: Secondary | ICD-10-CM | POA: Diagnosis present

## 2014-12-30 DIAGNOSIS — Z950 Presence of cardiac pacemaker: Secondary | ICD-10-CM

## 2014-12-30 DIAGNOSIS — R7989 Other specified abnormal findings of blood chemistry: Secondary | ICD-10-CM

## 2014-12-30 DIAGNOSIS — E871 Hypo-osmolality and hyponatremia: Secondary | ICD-10-CM

## 2014-12-30 DIAGNOSIS — R4182 Altered mental status, unspecified: Secondary | ICD-10-CM | POA: Diagnosis present

## 2014-12-30 DIAGNOSIS — N179 Acute kidney failure, unspecified: Secondary | ICD-10-CM

## 2014-12-30 DIAGNOSIS — I442 Atrioventricular block, complete: Secondary | ICD-10-CM | POA: Diagnosis present

## 2014-12-30 HISTORY — DX: Acute kidney failure, unspecified: N17.9

## 2014-12-30 HISTORY — DX: Atrioventricular block, complete: I44.2

## 2014-12-30 HISTORY — DX: Other specified abnormalities of plasma proteins: R77.8

## 2014-12-30 HISTORY — DX: Other specified abnormal findings of blood chemistry: R79.89

## 2014-12-30 HISTORY — DX: Unspecified atrial fibrillation: I48.91

## 2014-12-30 HISTORY — DX: Presence of cardiac pacemaker: Z95.0

## 2014-12-30 HISTORY — DX: Hypo-osmolality and hyponatremia: E87.1

## 2014-12-30 LAB — CBC
HCT: 35.1 % — ABNORMAL LOW (ref 39.0–52.0)
Hemoglobin: 11.9 g/dL — ABNORMAL LOW (ref 13.0–17.0)
MCH: 31.7 pg (ref 26.0–34.0)
MCHC: 33.9 g/dL (ref 30.0–36.0)
MCV: 93.6 fL (ref 78.0–100.0)
PLATELETS: 76 10*3/uL — AB (ref 150–400)
RBC: 3.75 MIL/uL — ABNORMAL LOW (ref 4.22–5.81)
RDW: 16.4 % — AB (ref 11.5–15.5)
WBC: 5.3 10*3/uL (ref 4.0–10.5)

## 2014-12-30 LAB — GLUCOSE, CAPILLARY
GLUCOSE-CAPILLARY: 142 mg/dL — AB (ref 65–99)
GLUCOSE-CAPILLARY: 157 mg/dL — AB (ref 65–99)
Glucose-Capillary: 190 mg/dL — ABNORMAL HIGH (ref 65–99)
Glucose-Capillary: 200 mg/dL — ABNORMAL HIGH (ref 65–99)

## 2014-12-30 LAB — RENAL FUNCTION PANEL
Albumin: 2.4 g/dL — ABNORMAL LOW (ref 3.5–5.0)
Anion gap: 9 (ref 5–15)
BUN: 54 mg/dL — AB (ref 6–20)
CALCIUM: 8.6 mg/dL — AB (ref 8.9–10.3)
CO2: 27 mmol/L (ref 22–32)
CREATININE: 3.44 mg/dL — AB (ref 0.61–1.24)
Chloride: 96 mmol/L — ABNORMAL LOW (ref 101–111)
GFR calc non Af Amer: 17 mL/min — ABNORMAL LOW (ref >60)
GFR, EST AFRICAN AMERICAN: 19 mL/min — AB (ref >60)
GLUCOSE: 190 mg/dL — AB (ref 65–99)
Phosphorus: 3.8 mg/dL (ref 2.5–4.6)
Potassium: 3.8 mmol/L (ref 3.5–5.1)
SODIUM: 132 mmol/L — AB (ref 135–145)

## 2014-12-30 LAB — AMMONIA: Ammonia: 39 umol/L — ABNORMAL HIGH (ref 9–35)

## 2014-12-30 MED ORDER — METOPROLOL SUCCINATE ER 25 MG PO TB24
12.5000 mg | ORAL_TABLET | Freq: Every day | ORAL | Status: DC
  Administered 2014-12-30: 12.5 mg via ORAL
  Filled 2014-12-30: qty 1

## 2014-12-30 MED ORDER — METOPROLOL SUCCINATE ER 25 MG PO TB24
25.0000 mg | ORAL_TABLET | Freq: Every day | ORAL | Status: DC
  Administered 2014-12-31: 25 mg via ORAL
  Filled 2014-12-30: qty 1

## 2014-12-30 MED FILL — Heparin Sodium (Porcine) 2 Unit/ML in Sodium Chloride 0.9%: INTRAMUSCULAR | Qty: 500 | Status: AC

## 2014-12-30 MED FILL — Lidocaine HCl Local Preservative Free (PF) Inj 1%: INTRAMUSCULAR | Qty: 60 | Status: AC

## 2014-12-30 MED FILL — Sodium Chloride Irrigation Soln 0.9%: Qty: 500 | Status: AC

## 2014-12-30 MED FILL — Gentamicin Sulfate Inj 40 MG/ML: INTRAMUSCULAR | Qty: 2 | Status: AC

## 2014-12-30 NOTE — Progress Notes (Signed)
Pt. Profile:  69 y/o male with a h/o CAD s/p CABG, AS s/p TAVR, atrial fibrillation, chronic diastolic CHF, HTN, DM, cirrhosis, and COPD admitted for acute on chronic diastolic CHF and symptomatic bradycardia due to complete Heart block.  S/p PPM.  In regards to his atrial fibrillation, he is a poor candidate for anticoagulation given portal hypertension with esophageal varices. He is on daily ASA + Plavix  Subjective: His wife feels he is more sharp today, constipation improved.  HR is up to 105 at times off meds  Objective: Vital signs in last 24 hours: Temp:  [97.7 F (36.5 C)-98.3 F (36.8 C)] 97.7 F (36.5 C) (08/29 0500) Pulse Rate:  [72-95] 72 (08/29 0500) Resp:  [14-20] 19 (08/29 0500) BP: (97-116)/(51-93) 97/51 mmHg (08/29 0500) SpO2:  [96 %-100 %] 100 % (08/29 0500) Weight change:  Last BM Date: 12/29/14 Intake/Output from previous day: -385  08/28 0701 - 08/29 0700 In: 840 [P.O.:840] Out: 1225 [Urine:1225] Intake/Output this shift:    PE: General:Pleasant affect, NAD Skin:Warm and dry, brisk capillary refill HEENT:normocephalic, sclera clear, mucus membranes moist Heart:irreg irreg without murmur, gallup, rub or click, pacer site with minimal bruising no hematoma Lungs:diminished in the bases without rales, rhonchi, or wheezes HGD:JMEQ, non tender, + BS, do not palpate liver spleen or masses Ext:2+ lower ext edema mid calf, 2+ pedal pulses, 2+ radial pulses Neuro:alert and oriented X 3, MAE, follows commands, + facial symmetry Tele:  A fib with pacing at times    Lab Results: No results for input(s): WBC, HGB, HCT, PLT in the last 72 hours. BMET  Recent Labs  12/28/14 0900 12/29/14 1005  NA 130* 126*  K 4.5 4.1  CL 93* 92*  CO2 25 23  GLUCOSE 205* 223*  BUN 52* 60*  CREATININE 5.85* 5.32*  CALCIUM 8.2* 8.2*   No results for input(s): TROPONINI in the last 72 hours.  Invalid input(s): CK, MB  No results found for: CHOL, HDL, LDLCALC,  LDLDIRECT, TRIG, CHOLHDL Lab Results  Component Value Date   HGBA1C 7.0* 10/28/2014     Lab Results  Component Value Date   TSH 1.836 12/26/2014      Studies/Results: ECHO:  Study Conclusions  - Left ventricle: The cavity size was normal. Systolic function was normal. The estimated ejection fraction was in the range of 55% to 60%. Wall motion was normal; there were no regional wall motion abnormalities. - Aortic valve: S/P TAVR with 29 mm Edwards Sapien 3 transcatheter heart valve Mean gradient (S): 20 mm Hg. Valve area (VTI): 1.96 cm^2. Valve area (Vmax): 1.9 cm^2. Valve area (Vmean): 1.82 cm^2. - Mitral valve: Calcified papillary muscles. Calcified annulus. There was trivial regurgitation. - Left atrium: The atrium was mildly dilated. - Right ventricle: Poorly visualized. - Pulmonary arteries: PA peak pressure: 38 mm Hg (S).  Impressions:  - The right ventricular systolic pressure was increased consistent with mild pulmonary hypertension  Medications: I have reviewed the patient's current medications. Scheduled Meds: . antiseptic oral rinse  7 mL Mouth Rinse BID  . arformoterol  15 mcg Nebulization Q12H  . aspirin  81 mg Oral Daily  . budesonide  0.25 mg Nebulization BID  . ezetimibe  10 mg Oral QHS  . insulin aspart  0-9 Units Subcutaneous TID WC  . lactulose  20 g Oral Daily  . potassium chloride  40 mEq Oral Daily  . predniSONE  5 mg Oral Q breakfast   Continuous Infusions:  PRN Meds:.acetaminophen, ipratropium-albuterol, ondansetron (ZOFRAN) IV, oxyCODONE-acetaminophen Assessment/Plan: Principal Problem:   Symptomatic bradycardia Active Problems:   S/P placement of cardiac pacemaker, single chamber, MDT 12/27/14, pacing appropriately --see post PPM CXR- post PPM echo without pericardial effusion   Hx CABG x 5   Cirrhosis   Hx TAVR (transcatheter aortic valve replacement)   Thrombocytopenia   Acute renal failure   Atrial fibrillation,  chronic- rate increasing off BB   Increased ammonia level- back on home lactulose   Hyposmolality and/or hyponatremia   CHB (complete heart block)   Elevated troponin level, secondary to CHB acute renal failure   Increased HR will add back lower dose BB for now.  toprol XL 12.5 daily was on toprol 25 BID and dilt CD 120   Edema, lower ext- renal to determine resuming diuretics  Mental status changes due to bradycardia and hepatic encephalopathy resolved with PPM and resuming lactulose.    LOS: 4 days   Time spent with pt. :15 minutes. [facility] R  Nurse Practitioner Certified Pager [phone] or after 5pm and on weekends call [phone] 12/30/2014, 8:21 AM   EP Attending  Patient seen and examined. Agree with above exam, data and plan. His kidney function is much improved. He has developed recurrent AV node conduction. Agree with restarting beta blocker. Continue lactulose. If kidney function continues to improve and HR controlled, will plan to discharge home tomorrow.  Leonia Reeves.D.

## 2014-12-30 NOTE — Progress Notes (Signed)
Pt O2 has been titrated down to his home O2 level of 2.5 LNC. Sats 97%

## 2014-12-30 NOTE — Care Management Important Message (Signed)
Important Message  Patient Details  Name: [name] MRN: [id number] Date of Birth: [birth year]   Medicare Important Message Given:  Yes-second notification given    Yvonna Alanis 12/30/2014, 3:44 PM

## 2014-12-30 NOTE — Progress Notes (Signed)
  [facility] Progress Note   Subjective: SCr further improved to 3.44 today, great UOP yesterday  Filed Vitals:   12/30/14 0500 12/30/14 0749 12/30/14 0857 12/30/14 1053  BP: 97/51   128/90  Pulse: 72 94  100  Temp: 97.7 F (36.5 C)     TempSrc: Oral     Resp: 19 18    Height:      Weight:      SpO2: 100% 96% 100%    Exam: Pleaseant , no distress, nasal O 2 Mild jvd Rales L base, R mostly clear RRR 2/6 SEM RUSB, no RG Abd obese, no ascites or HSM, +bs ntnd GU normal male Ext 2-3+ pitting edema below the knees bilat Neuro is alert, no asterixis nf   CXR - possible mild vasc congestion vs chronic changes ECHO 8/27 > LVEF 55%, s/p TAVR, mild pulm HTN  Assessment: 1. Acute renal failure - appears to be recovering 1. Normal baseline SCr 2. NOt on diuretics Daily weights, Daily Renal Panel, Strict I/Os, Avoid nephrotoxins (NSAIDs, judicious IV Contrast) 2. Cirrhosis - advanced disease w portal HTN on CT and encephalopathy/ [facility] under active treatment.  3. CHB s/p PPM 4. CAD hx CABG 5. COPD home O2 6. AS s/p TAVR 7. DM2 8. DJD/ RA    12/30/2014, 12:20 PM     Recent Labs Lab 12/28/14 0900 12/29/14 1005 12/30/14 0847  NA 130* 126* 132*  K 4.5 4.1 3.8  CL 93* 92* 96*  CO2 25 23 27   GLUCOSE 205* 223* 190*  BUN 52* 60* 54*  CREATININE 5.85* 5.32* 3.44*  CALCIUM 8.2* 8.2* 8.6*  PHOS  --   --  3.8    Recent Labs Lab 12/26/14 1928 12/30/14 0847  AST 68*  --   ALT 38  --   ALKPHOS 104  --   BILITOT 0.9  --   PROT 6.5  --   ALBUMIN 2.9* 2.4*    Recent Labs Lab 12/26/14 1928 12/27/14 0630 12/30/14 0847  WBC 6.0 7.4 5.3  HGB 12.0* 11.8* 11.9*  HCT 36.9* 35.6* 35.1*  MCV 98.4 96.2 93.6  PLT 81* 70* 76*   . antiseptic oral rinse  7 mL Mouth Rinse BID  . arformoterol  15 mcg Nebulization Q12H  . aspirin  81 mg Oral Daily  . budesonide  0.25 mg Nebulization BID  . ezetimibe  10 mg Oral QHS  . insulin aspart  0-9 Units Subcutaneous  TID WC  . lactulose  20 g Oral Daily  . metoprolol succinate  12.5 mg Oral Daily  . potassium chloride  40 mEq Oral Daily  . predniSONE  5 mg Oral Q breakfast     acetaminophen, ipratropium-albuterol, ondansetron (ZOFRAN) IV, oxyCODONE-acetaminophen

## 2014-12-30 NOTE — Clinical Documentation Improvement (Signed)
Cardiology  Can the diagnosis of altered mental status be further specified?   Encephalopathy - Alcoholic, Anoxic/Hypoxia, Drug Induced/Toxic (specify drug), Hepatic, Hypertensive, Hypoglycemic, Metabolic/Septic, Traumatic/post concussive, Wernicke, Other  Other  Clinically Undetermined  Document any associated diagnoses/conditions. Cirrhosis  Supporting Information: 12/29/14: "Cirrhosis - advanced disease w portal HTN on CT and encephalopathy/ [facility] under active treatment." 12/30/14: "Increased ammonia level- back on home lactulose" Ammonia level  44(12/29/14)       39(12/30/14)   Treatment:  lactulose 20 g        Please exercise your independent, professional judgment when responding. A specific answer is not anticipated or expected.   Thank You,  Andy Gauss, RN, BSN, CDI [phone] Health Information Management [email or web address]

## 2014-12-31 DIAGNOSIS — I5031 Acute diastolic (congestive) heart failure: Secondary | ICD-10-CM

## 2014-12-31 LAB — RENAL FUNCTION PANEL
ALBUMIN: 2.5 g/dL — AB (ref 3.5–5.0)
ANION GAP: 9 (ref 5–15)
BUN: 44 mg/dL — ABNORMAL HIGH (ref 6–20)
CALCIUM: 8.4 mg/dL — AB (ref 8.9–10.3)
CO2: 28 mmol/L (ref 22–32)
Chloride: 96 mmol/L — ABNORMAL LOW (ref 101–111)
Creatinine, Ser: 2.12 mg/dL — ABNORMAL HIGH (ref 0.61–1.24)
GFR, EST AFRICAN AMERICAN: 35 mL/min — AB (ref >60)
GFR, EST NON AFRICAN AMERICAN: 30 mL/min — AB (ref >60)
Glucose, Bld: 127 mg/dL — ABNORMAL HIGH (ref 65–99)
PHOSPHORUS: 3.2 mg/dL (ref 2.5–4.6)
Potassium: 3.8 mmol/L (ref 3.5–5.1)
SODIUM: 133 mmol/L — AB (ref 135–145)

## 2014-12-31 LAB — GLUCOSE, CAPILLARY: GLUCOSE-CAPILLARY: 141 mg/dL — AB (ref 65–99)

## 2014-12-31 MED ORDER — METOPROLOL SUCCINATE ER 25 MG PO TB24: 25.0000 mg | ORAL_TABLET | Freq: Every day | ORAL | Status: DC

## 2014-12-31 MED ORDER — TORSEMIDE 20 MG PO TABS: 20.0000 mg | ORAL_TABLET | Freq: Every day | ORAL | Status: DC

## 2014-12-31 NOTE — Discharge Summary (Signed)
ELECTROPHYSIOLOGY PROCEDURE DISCHARGE SUMMARY    Patient ID: [name],  MRN: [id number], DOB/AGE: April 18, 1946 69 y.o.  Admit date: 12/26/2014 Discharge date: 12/31/2014  Primary Care Physician: Donzetta Sprung, MD Primary Cardiologist: Purvis Sheffield Electrophysiologist: Ladona Ridgel  Primary Discharge Diagnosis:  Symptomatic bradycardia s/p PPM Acute on chronic diastolic heart failure Acute renal failure  Secondary Discharge Diagnosis:  1.  Permanent atrial fibrillation 2.  CAD s/p CABG 3.  AS s/p TAVR 4.  Hypertension 5.  Diabetes 6.  Cirrhosis 7.  COPD  No Known Allergies  Consults: Nephrology  Procedures This Admission:  1.  Echocardiogram 12/28/14 demonstrated EF 55-60%, no RWMA, mild pulmonary hypertension 2.  Permanent pacemaker implantation on 12/27/14 by Dr Ladona Ridgel. The patient received a MDT single chamber pacemaker with model number 5076 RV lead. There were no early apparent complications.   Brief HPI/Hospital Course:  TRINO HIGINBOTHAM is a 69 y.o. male with a past medical history significant for CAD s/p CABG, AS s/p TAVR, permanent atrial fibrillation, chronic diastolic heart failure, hypertension, diabetes, cirrhosis and COPD.  He presented on the day of admission to [facility] with weakness, dizziness and dyspnea on exertion.  He was found to be bradycardic with rates in the 30's.  He was transferred to [facility] for further evaluation.  Creatinine was noted to be significantly elevated from baseline.  He was evaluated by Dr Ladona Ridgel and underwent pacemaker implantation on 12/27/14.  He was monitored on telemetry overnight which demonstrated atrial fibrillation with ventricular pacing.  Left chest was without hematoma or ecchymosis.  The device was interrogated and found to be functioning normally.  CXR was obtained and demonstrated no pneumothorax status post device implantation.  His renal function continued to worsen and he was seen by nephrology in consult and his  renal function improved over the next few days.  At discharge, creatinine ahd improved to 2.12 and Dr Ladona Ridgel examined the patient and considered them stable for discharge to home.   He will be seen in follow up in 1 week for transition of care visit and re-evaluation of renal function.  His Metformin has been discontinued for now with acute renal failure and the patient has been advised to follow up with PCP.  Per Dr Ladona Ridgel, the patient will be discharged home on decreased dose of Torsemide.   This patients CHA2DS2-VASc Score and unadjusted Ischemic Stroke Rate (% per year) is equal to 4.8 % stroke rate/year from a score of 4 Above score calculated as 1 point each if present [CHF, HTN, DM, Vascular=MI/PAD/Aortic Plaque, Age if 65-74, or Male] Above score calculated as 2 points each if present [Age > 75, or Stroke/TIA/TE]  He is felt to be a poor candidate for anticoagulation with portal hypertension and esophageal varices.    Physical Exam: Filed Vitals:   12/30/14 1435 12/30/14 2030 12/30/14 2035 12/31/14 0529  BP: 120/61  116/53 138/69  Pulse: 86  61 83  Temp: 97.9 F (36.6 C)  98.1 F (36.7 C) 97.7 F (36.5 C)  TempSrc: Oral  Oral Oral  Resp: 15  18 18   Height:      Weight:    260 lb 2.3 oz (118 kg)  SpO2: 97% 99% 100% 98%    GEN- The patient is well appearing, alert and oriented x 3 today.   HEENT: normocephalic, atraumatic; sclera clear, conjunctiva pink; hearing intact; oropharynx clear; neck supple, no JVP Lymph- no cervical lymphadenopathy Lungs- Clear to ausculation bilaterally, normal work of breathing.  No wheezes, rales, rhonchi Heart- Regular rate and rhythm, no murmurs, rubs or gallops, PMI not laterally displaced GI- soft, non-tender, non-distended, bowel sounds present, no hepatosplenomegaly Extremities- no clubbing, cyanosis, or edema; DP/PT/radial pulses 2+ bilaterally MS- no significant deformity or atrophy Skin- warm and dry, no rash or lesion Psych- euthymic  mood, full affect Neuro- strength and sensation are intact    Labs:   Lab Results  Component Value Date   WBC 5.3 12/30/2014   HGB 11.9* 12/30/2014   HCT 35.1* 12/30/2014   MCV 93.6 12/30/2014   PLT 76* 12/30/2014    Recent Labs Lab 12/26/14 1928  12/31/14 0531  NA 134*  < > 133*  K 4.0  < > 3.8  CL 92*  < > 96*  CO2 28  < > 28  BUN 33*  < > 44*  CREATININE 3.36*  < > 2.12*  CALCIUM 8.9  < > 8.4*  PROT 6.5  --   --   BILITOT 0.9  --   --   ALKPHOS 104  --   --   ALT 38  --   --   AST 68*  --   --   GLUCOSE 182*  < > 127*  < > = values in this interval not displayed.   Discharge Medications:    Medication List    STOP taking these medications        metFORMIN 1000 MG tablet  Commonly known as:  GLUCOPHAGE      TAKE these medications        ascorbic acid 500 MG tablet  Commonly known as:  VITAMIN C  Take 500 mg by mouth daily.     aspirin 81 MG tablet  Take 1 tablet (81 mg total) by mouth daily.     budesonide 0.25 MG/2ML nebulizer solution  Commonly known as:  PULMICORT  Take 0.25 mg by nebulization 2 (two) times daily.     BUTRANS 10 MCG/HR Ptwk patch  Generic drug:  buprenorphine  Place 10 mcg onto the skin once a week.     cetirizine 10 MG tablet  Commonly known as:  ZYRTEC  Take 10 mg by mouth at bedtime.     clopidogrel 75 MG tablet  Commonly known as:  PLAVIX  Take 1 tablet (75 mg total) by mouth daily.     co-enzyme Q-10 30 MG capsule  Take 30 mg by mouth at bedtime.     ipratropium-albuterol 0.5-2.5 (3) MG/3ML Soln  Commonly known as:  DUONEB  Take 3 mLs by nebulization every 6 (six) hours as needed (for shortness of breath).     COMBIVENT 18-103 MCG/ACT inhaler  Generic drug:  albuterol-ipratropium  Inhale 2 puffs into the lungs every 6 (six) hours as needed for wheezing or shortness of breath.     desonide 0.05 % cream  Commonly known as:  DESOWEN  Apply 1 application topically 2 (two) times daily as needed (for rash).      diltiazem 120 MG 24 hr capsule  Commonly known as:  CARDIZEM CD  Take 1 capsule (120 mg total) by mouth daily.     DRY EYES OP  Apply 1 drop to eye daily as needed (dry eyes).     ezetimibe 10 MG tablet  Commonly known as:  ZETIA  Take 10 mg by mouth at bedtime.     flunisolide 29 MCG/ACT (0.025%) nasal spray  Commonly known as:  NASAREL  Place 2 sprays into the nose  at bedtime as needed for rhinitis (for congestion). Dose is for each nostril.     insulin NPH-regular Human (70-30) 100 UNIT/ML injection  Commonly known as:  NOVOLIN 70/30  Inject 15 Units into the skin 2 (two) times daily.     ketoconazole 2 % cream  Commonly known as:  NIZORAL  Apply 1 application topically 2 (two) times daily as needed for irritation.     KRISTALOSE 20 G packet  Generic drug:  lactulose  Take 20 g by mouth 2 (two) times daily.     loratadine 10 MG tablet  Commonly known as:  CLARITIN  Take 10 mg by mouth every morning.     metolazone 5 MG tablet  Commonly known as:  ZAROXOLYN  Take 5 mg by mouth 2 (two) times daily.     metoprolol succinate 25 MG 24 hr tablet  Commonly known as:  TOPROL-XL  Take 1 tablet (25 mg total) by mouth daily.     multivitamin tablet  Take 1 tablet by mouth daily.     oxyCODONE-acetaminophen 5-325 MG per tablet  Commonly known as:  PERCOCET/ROXICET  Take 1-2 tablets by mouth every 4 (four) hours as needed for moderate pain.     polyethylene glycol packet  Commonly known as:  MIRALAX / GLYCOLAX  Take 17 g by mouth daily as needed for moderate constipation.     potassium chloride SA 20 MEQ tablet  Commonly known as:  KLOR-CON M20  Take 1 tablet (20 mEq total) by mouth daily.     predniSONE 5 MG tablet  Commonly known as:  DELTASONE  Take 5 mg by mouth daily with breakfast.     torsemide 20 MG tablet  Commonly known as:  DEMADEX  Take 1 tablet (20 mg total) by mouth daily.        Disposition:  Discharge Instructions    Diet - low sodium heart  healthy    Complete by:  As directed      Increase activity slowly    Complete by:  As directed           Follow-up Information    Follow up with Tereso Newcomer, PA-C On 01/07/2015.   Specialties:  Physician Assistant, Radiology, Interventional Cardiology   Why:  at 11AM   Contact information:   [address] [place] [address] [phone]       Follow up with [facility] On 01/07/2015.   Why:  at 11:30AM for wound check   Contact information:   [address] [place] [address]       Follow up with Donzetta Sprung, MD In 1 week.   Specialty:  Family Medicine   Why:  to discuss diabetes medications   Contact information:   [address] [place] [address] [phone]       Duration of Discharge Encounter: Greater than 30 minutes including physician time.  Signed, Gypsy Balsam, NP 12/31/2014 10:16 AM   EP Attending  Patient seen and examined. Agree with above. Stable for discharge.  Leonia Reeves.D.

## 2014-12-31 NOTE — Discharge Instructions (Signed)
° ° °  Supplemental Discharge Instructions for  Pacemaker/Defibrillator Patients  Activity No heavy lifting or vigorous activity with your left/right arm for 6 to 8 weeks.  Do not raise your left/right arm above your head for one week.  Gradually raise your affected arm as drawn below.           __     01/03/15                        01/04/15                          01/05/15                01/06/15  NO DRIVING for   1 week    WOUND CARE - Keep the wound area clean and dry.  Do not get this area wet for one week. No showers for one week; you may shower on   01/06/15  . - The tape/steri-strips on your wound will fall off; do not pull them off.  No bandage is needed on the site.  DO  NOT apply any creams, oils, or ointments to the wound area. - If you notice any drainage or discharge from the wound, any swelling or bruising at the site, or you develop a fever > 101? F after you are discharged home, call the office at once.  Special Instructions - You are still able to use cellular telephones; use the ear opposite the side where you have your pacemaker/defibrillator.  Avoid carrying your cellular phone near your device. - When traveling through airports, show security personnel your identification card to avoid being screened in the metal detectors.  Ask the security personnel to use the hand wand. - Avoid arc welding equipment, MRI testing (magnetic resonance imaging), TENS units (transcutaneous nerve stimulators).  Call the office for questions about other devices. - Avoid electrical appliances that are in poor condition or are not properly grounded. - Microwave ovens are safe to be near or to operate.

## 2014-12-31 NOTE — Progress Notes (Signed)
Patient ID: [name], male   DOB: 20-Jun-1945, 69 y.o.   MRN: [id number]    Patient Name: [name] Date of Encounter: 12/31/2014     Principal Problem:   Symptomatic bradycardia Active Problems:   S/P CABG x 5   Cirrhosis   S/P TAVR (transcatheter aortic valve replacement)   Thrombocytopenia   Acute renal failure   Atrial fibrillation, chronic   S/P placement of cardiac pacemaker, single chamber, MDT 12/27/14   Increased ammonia level   Hyposmolality and/or hyponatremia   CHB (complete heart block)   Elevated troponin level, secondary to CHB acute renal failure   Mental status alteration, due to bradycardia and hepatic encephalopathy now improved    SUBJECTIVE  Feels better. " I want to go home"  CURRENT MEDS . antiseptic oral rinse  7 mL Mouth Rinse BID  . arformoterol  15 mcg Nebulization Q12H  . aspirin  81 mg Oral Daily  . budesonide  0.25 mg Nebulization BID  . ezetimibe  10 mg Oral QHS  . insulin aspart  0-9 Units Subcutaneous TID WC  . lactulose  20 g Oral Daily  . metoprolol succinate  25 mg Oral Daily  . potassium chloride  40 mEq Oral Daily  . predniSONE  5 mg Oral Q breakfast    OBJECTIVE  Filed Vitals:   12/30/14 1435 12/30/14 2030 12/30/14 2035 12/31/14 0529  BP: 120/61  116/53 138/69  Pulse: 86  61 83  Temp: 97.9 F (36.6 C)  98.1 F (36.7 C) 97.7 F (36.5 C)  TempSrc: Oral  Oral Oral  Resp: 15  18 18   Height:      Weight:    260 lb 2.3 oz (118 kg)  SpO2: 97% 99% 100% 98%    Intake/Output Summary (Last 24 hours) at 12/31/14 0744 Last data filed at 12/31/14 0400  Gross per 24 hour  Intake    120 ml  Output   1000 ml  Net   -880 ml   Filed Weights   12/26/14 1652 12/29/14 0431 12/31/14 0529  Weight: 251 lb 8 oz (114.08 kg) 260 lb 9.6 oz (118.207 kg) 260 lb 2.3 oz (118 kg)    PHYSICAL EXAM  General: Pleasant, NAD. Neuro: Alert and oriented X 3. Moves all extremities spontaneously. Psych: Normal affect. HEENT:   Normal  Neck: Supple without bruits or JVD. Lungs:  Resp regular and unlabored, CTA. Heart: IRRR no s3, s4, or murmurs. Abdomen: Soft, non-tender, non-distended, BS + x 4.  Extremities: No clubbing, cyanosis or edema. DP/PT/Radials 2+ and equal bilaterally.  Accessory Clinical Findings  CBC  Recent Labs  12/30/14 0847  WBC 5.3  HGB 11.9*  HCT 35.1*  MCV 93.6  PLT 76*   Basic Metabolic Panel  Recent Labs  12/30/14 0847 12/31/14 0531  NA 132* 133*  K 3.8 3.8  CL 96* 96*  CO2 27 28  GLUCOSE 190* 127*  BUN 54* 44*  CREATININE 3.44* 2.12*  CALCIUM 8.6* 8.4*  PHOS 3.8 3.2   Liver Function Tests  Recent Labs  12/30/14 0847 12/31/14 0531  ALBUMIN 2.4* 2.5*   No results for input(s): LIPASE, AMYLASE in the last 72 hours. Cardiac Enzymes No results for input(s): CKTOTAL, CKMB, CKMBINDEX, TROPONINI in the last 72 hours. BNP Invalid input(s): POCBNP D-Dimer No results for input(s): DDIMER in the last 72 hours. Hemoglobin A1C No results for input(s): HGBA1C in the last 72 hours. Fasting Lipid Panel No results for input(s): CHOL, HDL,  LDLCALC, TRIG, CHOLHDL, LDLDIRECT in the last 72 hours. Thyroid Function Tests No results for input(s): TSH, T4TOTAL, T3FREE, THYROIDAB in the last 72 hours.  Invalid input(s): FREET3  TELE  Atrial fib with a CVR/ paced  Radiology/Studies  Dg Chest 2 View  12/28/2014   CLINICAL DATA:  69 year old male status post pacemaker placement.  EXAM: CHEST  2 VIEW  COMPARISON:  Chest x-ray 12/26/2014.  FINDINGS: New left-sided pacemaker device in position with lead tip projecting over the expected location of the right ventricular apex. Status post median sternotomy for CABG, including LIMA. Post procedural changes of TAVR are noted, with what appears to be an Edwards Sapien 3 valve in position. No pneumothorax. No consolidative airspace disease. Mild diffuse interstitial prominence. Small bilateral pleural effusions. Cephalization of  pulmonary vasculature. Cardiopericardial silhouette appears borderline enlarged. Upper mediastinal contours are within normal limits allowing for lordotic positioning the patient. Orthopedic fixation hardware in the lower cervical spine.  IMPRESSION: 1. Status post left-sided pacemaker placement, without pneumothorax or other definite acute complicating features. 2. Cardiopericardial silhouette does appear borderline enlarged, and has a slightly different appearance on today's examination than prior studies. The possibility of a pericardial effusion is not excluded, and attention on followup studies is recommended. This may simply be projectional on today's examination. 3. Mild interstitial pulmonary edema. 4. Additional postoperative and post procedural changes, as above.   Electronically Signed   By: Trudie Reed M.D.   On: 12/28/2014 08:21    ASSESSMENT AND PLAN  1. Acute diastolic heart failure  - he is much improved with increased HR. No diuretic given initially due to renal failure. At discharge he should be on Torsemide daily. 2. Chronic atrial fib - his ventricular rate is improved on toprol. He will be discharged on 25 mg daily 3. Acute renal failure - likely due to a low output state. He is much improved from several days ago. Kidney function continues to come down.  4. Cirrhosis - he will be discharged on lactulose. I would like him back on his usual home dose. 5. PPM - his device appears to be working normally. Will recheck in several months.   Gregg Taylor,M.D.  12/31/2014 7:44 AM

## 2015-01-01 ENCOUNTER — Telehealth: Payer: Self-pay | Admitting: Cardiovascular Disease

## 2015-01-01 NOTE — Telephone Encounter (Signed)
Returned patient call to wife.  Stated that the patient was dc'd from the hospital yesterday.  She stated that she "talked to the doctors" about the Zaroxolyn 5mg  twice a day that was listed on his d/c paperwork prior to d/c and thought that it wasn't going to be part of the d/c summary.  She stated that the patient had had extensive diuresing and thought this would be too much.  Stated that they hadn't filled this script. Wants to know if this ok.

## 2015-01-01 NOTE — Telephone Encounter (Signed)
Please clarify this for [place].Discharge summary dose include Zaroxolyn. Thank you  Routed to Ms Glory Buff NP who discharged patient

## 2015-01-01 NOTE — Telephone Encounter (Signed)
Wife informed,will stop Zaroxlyn

## 2015-01-01 NOTE — Telephone Encounter (Signed)
New Prob    Pts wife has some questions regarding pts medication list. Please call.

## 2015-01-01 NOTE — Telephone Encounter (Signed)
Please advise patient to discontinue Zaroxyln.  Thank you!

## 2015-01-06 NOTE — Progress Notes (Signed)
Cardiology Office Note   Date:  01/07/2015   ID:  [name], DOB 1945/12/21, MRN [id number]    Patient Care Team: Richardean Chimera, MD as PCP - General (Unknown Physician Specialty) Laqueta Linden, MD as Attending Physician (Cardiology) Marinus Maw, MD as Consulting Physician (Clinical Cardiac Electrophysiology)    Chief Complaint  Patient presents with  . Hospitalization Follow-up    s/p PPM; AKI  . Congestive Heart Failure  . Coronary Artery Disease     History of Present Illness: [name] is a 69 y.o. male with a hx of CAD status post CABG in 1998, aortic stenosis status post TAVR 12/15, diastolic HF, chronic atrial fibrillation/flutter, HTN, HL, COPD, diabetes. He has cirrhosis with portal hypertension. He is felt to be a poor candidate for anticoagulation.  LHC 10/15 demonstrated patent LIMA-LAD, patent SVG-RCA and patent SVG-diagonal with 80% proximal stenosis and patent SVG-OM1/OM2. There was an 80% stenosis in the continuation of graft to OM2.   He was admitted 10/2014 with acute on chronic diastolic CHF.   Admitted 8/25-8/30 with weakness, dizziness, near syncope, dyspnea with exertion and acute kidney injury in the setting of profound bradycardia. Creatinine was up to 5.85. He was [facility] from [facility] to [facility]. Troponin levels were mildly elevated in a flat pattern.  He was evaluated by electrophysiology. He underwent implantation of a single-chamber pacemaker by Dr. Ladona Ridgel 12/27/14. He was followed by nephrology for his acute renal failure. Creatinine did improve to a level of 2.12 at discharge. His diuretics were held during his admission but resumed at a lower dose at discharge. Metformin was DC'd.  He returns for follow-up.  He is here today with his wife. He did see his primary care physician last week. Follow-up BMET was drawn. Results are pending. His weight has remained stable since discharge. He denies chest discomfort.  Breathing is stable. He is NYHA 2b-3. He denies orthopnea or PND. He denies syncope. He has LE edema but this is stable.  He has taken an extra dose of torsemide since DC from the hospital.   Studies/Reports Reviewed Today:  Echo 12/28/14 EF 55-60%, no RWMA, s/p TAVR, mean AV gradient 20 mmHg, MAC, trivial MR, mild LAE, PASP 38 mmHg - mild pulmonary hypertension.  LHC 01/2014 Left mainstem: Normal Left anterior descending (LAD): Diffuse disease in the proximal vessel to 50-60%. Competitive flow from IMA graft.  Left circumflex (LCx): Patent in the AV groove. The first and second OMs are occluded.  Right coronary artery (RCA): 100% occlusion proximally. LIMA to the LAD is widely patent.  SVG to the RCA is widely patent. There is a 30% stenosis in the mid graft. Following this there is a patent stent.  SVG sequential to OM 1 and OM 2 is patent there is 80% stenosis in the continuation segment of the SVG to OM 2.  SVG to the diagonal is patent. There is a focal 80% stenosis in the proximal SVG.  Left ventriculography: Not done  Contrast: 85 cc Radiation dose/Fluoro time: 11 minutes Final Conclusions:  1. Severe 3 vessel obstructive CAD 2. Patent LIMA to the LAD 3. Patent SVG to RCA 4. Patent SVG to diagonal with 80% proximal stenosis. 5. Patent SVG to the OM1 and OM2. 80% stenosis in continuation of graft to OM2.  6. AV mean gradient of 20 mm Hg. Data may be influenced by presence of Afib. 7. Normal right heart pressures.  Recommendations: Will discuss findings with primary  team.   Past Medical History  Diagnosis Date  . COPD (chronic obstructive pulmonary disease)   . Elevated liver function tests   . Hypertension   . Aortic stenosis     a. 2D ECHO: 01/30/2014: ef 50-55%. No RWMAs. Severe ZO:XWRUE area (VTI): 0.82 cm^2. Valve area (Vmax): 0.74 cm^2. Valve area (Vmean): 0.75 Cm^2. Mod LA/RA dilation. PA peak pressure 33  . CAD (coronary artery disease)   . GERD  (gastroesophageal reflux disease)   . Chronic atrial flutter   . 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 Morbid obesity   . Coronary atherosclerosis of artery bypass graft 2010  . Acute on chronic diastolic heart failure 02/01/2014  . Chronic diastolic congestive heart failure 02/01/2014  . Epistaxis     Developed on Xarelto therapy  . Cirrhosis 02/01/2014  . Portal hypertension 02/01/2014  . Chronic pancreatitis 02/01/2014    Noted on CT scan  . Shortness of breath   . Dizziness   . Heart murmur   . Asthma     uses nebulizers daily  . Dysrhythmia     fib/flutter  . Anemia     was on xalerto sept 2015 for afib - was taken off of - had to get unit of prbc  . History of blood transfusion 01/2014    "right after starting Xeralto"  . 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 Complication of anesthesia     "has a hard time breathing when he wakes up"  . Hyperlipidemia   . Pneumonia "several times:  . Type II diabetes mellitus   . Rheumatoid arthritis(714.0)     "everywhere"  . Degenerative disc disease, cervical   . Degenerative disc disease, lumbar 2010  . Osteoarthritis     "everywhere"  . Chronic back pain     "lower back usually; upper back fior the last couple weeks"  (12/26/2014)  . ARF (acute renal failure) 11/2014  . On home oxygen therapy     "2.5L at night & prn during the day" (12/26/2014)  . Other myositis, left thigh dx'd 12/26/2014  . Acute renal failure 12/30/2014  . Atrial fibrillation 12/30/2014  . 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 Hyposmolality and/or hyponatremia 12/30/2014  . CHB (complete heart block) 12/30/2014  . Elevated troponin level, secondary to CHB acute renal failure 12/30/2014    Past Surgical History    Procedure Laterality Date  . Coronary angioplasty with stent placement  01/02/2009    s/p bare metal stent to the vein graft to the RCA 9/20. nml LV function   . Appendectomy  1950  . Coronary artery bypass graft  03/27/1997    CABG x 5 by Dr Cornelius Moras  . Anterior cervical decomp/discectomy fusion  12/2006  . Tonsillectomy and adenoidectomy  1950  . Lumbar disc surgery  2011    "put plastic wedge in"  . Esophagogastroduodenoscopy (egd) with propofol N/A 02/01/2014    Procedure: ESOPHAGOGASTRODUODENOSCOPY (EGD) WITH PROPOFOL;  Surgeon: Shirley Friar, MD;  Location[facility] ENDOSCOPY;  Service: Endoscopy;  Laterality: N/A;  . Colonoscopy with propofol N/A 02/01/2014    Procedure: COLONOSCOPY WITH PROPOFOL;  Surgeon: Shirley Friar, MD;  Location: [facility] ENDOSCOPY;  Service: Endoscopy;  Laterality: N/A;  . Multiple extractions with alveoloplasty N/A 03/12/2014    Procedure: MULTIPLE EXTRACTION OF TOOTH #'S 2,6,7,8,9,10,11,14,18,20,29,31 WITH ALVEOLOPLASTY AND GROSS DEBRIDMENT OF REMAINING TEETH;  Surgeon: Charlynne Pander, DDS;  Location: [facility] OR;  Service: Oral Surgery;  Laterality: N/A;  . Back surgery    . Transcatheter aortic valve replacement, transfemoral N/A 04/03/2014    Procedure: TRANSCATHETER AORTIC VALVE REPLACEMENT, TRANSFEMORAL;  Surgeon: Purcell Nails, MD;  Location: [facility] OR;  Service: Open Heart Surgery;  Laterality: N/A;  . Intraoperative transesophageal echocardiogram N/A 04/03/2014    Procedure: INTRAOPERATIVE TRANSESOPHAGEAL ECHOCARDIOGRAM;  Surgeon: Purcell Nails, MD;  Location: [facility] OR;  Service: Open Heart Surgery;  Laterality: N/A;  . Left and right heart catheterization with coronary/graft angiogram N/A 02/05/2014    Procedure: LEFT AND RIGHT HEART CATHETERIZATION WITH Isabel Caprice;  Surgeon: Peter M Swaziland, MD;  Location: [facility] CATH LAB;  Service: Cardiovascular;  Laterality: N/A;  . Posterior laminectomy / decompression cervical spine  05/2007  . Ep implantable device  N/A 12/27/2014    Procedure: Pacemaker Implant;  Surgeon: Marinus Maw, MD;  Location: [facility] INVASIVE CV LAB;  Service: Cardiovascular;  Laterality: N/A;     Current Outpatient Prescriptions  Medication Sig Dispense Refill  . albuterol-ipratropium (COMBIVENT) 18-103 MCG/ACT inhaler Inhale 2 puffs into the lungs every 6 (six) hours as needed for wheezing or shortness of breath.     . Artificial Tear Ointment (DRY EYES OP) Apply 1 drop to eye daily as needed (dry eyes).    Marland Kitchen ascorbic acid (VITAMIN C) 500 MG tablet Take 500 mg by mouth daily.     Marland Kitchen aspirin 81 MG tablet Take 1 tablet (81 mg total) by mouth daily.    . budesonide (PULMICORT) 0.25 MG/2ML nebulizer solution Take 0.25 mg by nebulization 2 (two) times daily.    . buprenorphine (BUTRANS) 10 MCG/HR PTWK patch Place 10 mcg onto the skin once a week.    . cetirizine (ZYRTEC) 10 MG tablet Take 10 mg by mouth at bedtime.    . clopidogrel (PLAVIX) 75 MG tablet Take 1 tablet (75 mg total) by mouth daily. 90 tablet 3  . co-enzyme Q-10 30 MG capsule Take 30 mg by mouth at bedtime.     Marland Kitchen desonide (DESOWEN) 0.05 % cream Apply 1 application topically 2 (two) times daily as needed (for rash).     Marland Kitchen diltiazem (CARDIZEM CD) 120 MG 24 hr capsule Take 1 capsule (120 mg total) by mouth daily. (Patient taking differently: Take 60 mg by mouth 2 (two) times daily. ) 90 capsule 3  . ezetimibe (ZETIA) 10 MG tablet Take 10 mg by mouth at bedtime.     . flunisolide (NASAREL) 29 MCG/ACT (0.025%) nasal spray Place 2 sprays into the nose at bedtime as needed for rhinitis (for congestion). Dose is for each nostril.    . insulin NPH-regular Human (NOVOLIN 70/30) (70-30) 100 UNIT/ML injection Inject 15 Units into the skin 2 (two) times daily. (Patient taking differently: Inject 30-40 Units into the skin 2 (two) times daily. 40 as a base; <180 = 40 units) 10 mL 11  . ipratropium-albuterol (DUONEB) 0.5-2.5 (3) MG/3ML SOLN Take 3 mLs by nebulization every 6 (six) hours as  needed (for shortness of breath).    Marland Kitchen ketoconazole (NIZORAL) 2 % cream Apply 1 application topically 2 (two) times daily as needed for irritation.     Marland Kitchen KRISTALOSE 20 G packet Take 20 g by mouth 2 (two) times daily.    Marland Kitchen  loratadine (CLARITIN) 10 MG tablet Take 10 mg by mouth every morning.     . metoprolol succinate (TOPROL-XL) 25 MG 24 hr tablet Take 1 tablet (25 mg total) by mouth daily.    . Multiple Vitamin (MULTIVITAMIN) tablet Take 1 tablet by mouth daily.      Marland Kitchen oxyCODONE-acetaminophen (PERCOCET/ROXICET) 5-325 MG per tablet Take 1-2 tablets by mouth every 4 (four) hours as needed for moderate pain. (Patient taking differently: Take 1 tablet by mouth every 4 (four) hours as needed for moderate pain. ) 40 tablet 0  . potassium chloride SA (KLOR-CON M20) 20 MEQ tablet Take 1 tablet (20 mEq total) by mouth daily. (Patient taking differently: Take 20 mEq by mouth 2 (two) times daily. ) 90 tablet 3  . predniSONE (DELTASONE) 5 MG tablet Take 5 mg by mouth daily with breakfast.    . rifaximin (XIFAXAN) 550 MG TABS tablet Take 550 mg by mouth 2 (two) times daily.    Marland Kitchen torsemide (DEMADEX) 20 MG tablet Take 1 tablet (20 mg total) by mouth daily. 180 tablet 3   No current facility-administered medications for this visit.    Allergies:   Review of patient's allergies indicates no known allergies.    Social History:  The patient  reports that he quit smoking about 18 years ago. His smoking use included Cigarettes. He started smoking about 53 years ago. He has a 105 pack-year smoking history. His smokeless tobacco use includes Chew. He reports that he does not drink alcohol or use illicit drugs.   Family History:  The patient's family history includes Asthma in his child; Cancer in his father; Emphysema in his brother; Hypertension in his mother; Liver cancer in his paternal grandfather; Rheum arthritis in his maternal aunt; Stroke in his brother.    ROS:   Please see the history of present illness.    Review of Systems  Constitution: Positive for malaise/fatigue.  HENT: Positive for hearing loss.   Cardiovascular: Positive for dyspnea on exertion, irregular heartbeat and leg swelling.  Hematologic/Lymphatic: Bruises/bleeds easily.  Musculoskeletal: Positive for back pain and myalgias.  Neurological: Positive for loss of balance.  All other systems reviewed and are negative.     PHYSICAL EXAM: VS:  BP 112/60 mmHg  Pulse 95  Ht 5\' 11"  (1.803 m)  Wt 258 lb 6.4 oz (117.209 kg)  BMI 36.06 kg/m2    Wt Readings from Last 3 Encounters:  01/07/15 258 lb 6.4 oz (117.209 kg)  12/31/14 260 lb 2.3 oz (118 kg)  11/12/14 236 lb (107.049 kg)     GEN: Well nourished, well developed, in no acute distress HEENT: normal Neck: no JVD,  no masses Cardiac:  Normal S1/S2, irregularly irregular rhythm; no murmur ,  no rubs or gallops, 2+ bilateral LE edema up to the knee Respiratory:  Decreased breath sounds bilaterally, no wheezing, rhonchi or rales. GI: soft, nontender, nondistended, + BS MS: no deformity or atrophy Skin: warm and dry  Neuro:  CNs II-XII intact, Strength and sensation are intact Psych: Normal affect   EKG:  EKG is ordered today.  It demonstrates:   Atrial fibrillation, HR 95, RBBB, LAD   Recent Labs: 04/08/2014: Pro B Natriuretic peptide (BNP) 237.7* 10/28/2014: B Natriuretic Peptide 76.0 12/26/2014: ALT 38; Magnesium 1.9; TSH 1.836 12/30/2014: Hemoglobin 11.9*; Platelets 76* 12/31/2014: BUN 44*; Creatinine, Ser 2.12*; Potassium 3.8; Sodium 133*    Lipid Panel No results found for: CHOL, TRIG, HDL, CHOLHDL, VLDL, LDLCALC, LDLDIRECT    ASSESSMENT AND  PLAN:  Chronic diastolic CHF: Patient continues to exhibit evidence of volume excess. However, this is stable since DC from the hospital. His torsemide dose was reduced in the setting of acute renal failure in the setting of symptomatic bradycardia. Recent BMET was obtained by his primary care physician. We will try to  obtain those results. As long as his renal function has improved, I would recommend increasing his torsemide back to 40 mg daily with close follow-up on his renal function and potassium.  Symptomatic Bradycardia Status Post Pacemaker: Pacemaker was interrogated today. It is functioning appropriately. Follow up with EP as planned.  Coronary Artery Disease: Cardiac catheterization in 10/15 demonstrated patent LIMA-LAD, patent SVG-RCA, patent SVG-OM1/OM2 with 80% stenosis in the continuation segment of the graft to OM2, SVG-diagonal patent with 80% proximal stenosis in the vein graft. Medical therapy has been pursued. He is not having angina. Continue aspirin, Plavix, calcium channel blocker, sodium, beta blocker.  Aortic Stenosis Status Post TAVR: Recent echo 12/28/14 with normal EF and stable gradient across the aortic valve.  Chronic Atrial Fibrillation: Rate with fair control. He is not a candidate for anticoagulation. Continue current therapy.  Acute Renal Failure: Renal function was improved at discharge from the hospital. We will try to obtain recent BMET results from the patient's primary care physician.  Hypertension: Controlled.  Hyperlipidemia: Continue Zetia.  Diabetes mellitus: He is now off of metformin in the setting of recent acute renal failure.    Medication Changes: Current medicines are reviewed at length with the patient today.  Concerns regarding medicines are as outlined above.  The following changes have been made:   Discontinued Medications   POLYETHYLENE GLYCOL (MIRALAX / GLYCOLAX) PACKET    Take 17 g by mouth daily as needed for moderate constipation.   Modified Medications   No medications on file   New Prescriptions   No medications on file    Labs/ tests ordered today include:   Orders Placed This Encounter  Procedures  . EKG 12-Lead      Disposition:    FU with Dr. Prentice Docker  3-4 weeks.     Signed, Brynda Rim, MHS 01/07/2015 5:26  PM    [facility] [address] [place], [place], [place]  [address] Phone: [phone]; Fax: [phone]

## 2015-01-07 ENCOUNTER — Encounter: Payer: Self-pay | Admitting: Physician Assistant

## 2015-01-07 ENCOUNTER — Ambulatory Visit (INDEPENDENT_AMBULATORY_CARE_PROVIDER_SITE_OTHER): Payer: Medicare Other | Admitting: Physician Assistant

## 2015-01-07 ENCOUNTER — Ambulatory Visit (INDEPENDENT_AMBULATORY_CARE_PROVIDER_SITE_OTHER): Payer: Medicare Other | Admitting: *Deleted

## 2015-01-07 VITALS — BP 112/60 | HR 95 | Ht 71.0 in | Wt 258.4 lb

## 2015-01-07 DIAGNOSIS — I5032 Chronic diastolic (congestive) heart failure: Secondary | ICD-10-CM | POA: Diagnosis not present

## 2015-01-07 DIAGNOSIS — I482 Chronic atrial fibrillation, unspecified: Secondary | ICD-10-CM

## 2015-01-07 DIAGNOSIS — Z950 Presence of cardiac pacemaker: Secondary | ICD-10-CM

## 2015-01-07 DIAGNOSIS — I2510 Atherosclerotic heart disease of native coronary artery without angina pectoris: Secondary | ICD-10-CM | POA: Diagnosis not present

## 2015-01-07 DIAGNOSIS — I10 Essential (primary) hypertension: Secondary | ICD-10-CM

## 2015-01-07 DIAGNOSIS — I4891 Unspecified atrial fibrillation: Secondary | ICD-10-CM

## 2015-01-07 DIAGNOSIS — E119 Type 2 diabetes mellitus without complications: Secondary | ICD-10-CM

## 2015-01-07 DIAGNOSIS — E785 Hyperlipidemia, unspecified: Secondary | ICD-10-CM

## 2015-01-07 DIAGNOSIS — I25810 Atherosclerosis of coronary artery bypass graft(s) without angina pectoris: Secondary | ICD-10-CM

## 2015-01-07 DIAGNOSIS — N179 Acute kidney failure, unspecified: Secondary | ICD-10-CM

## 2015-01-07 DIAGNOSIS — I442 Atrioventricular block, complete: Secondary | ICD-10-CM | POA: Diagnosis not present

## 2015-01-07 LAB — CUP PACEART INCLINIC DEVICE CHECK
Brady Statistic RV Percent Paced: 19 %
Date Time Interrogation Session: 20160906122813
Lead Channel Impedance Value: 555 Ohm
Lead Channel Pacing Threshold Amplitude: 0.75 V
Lead Channel Sensing Intrinsic Amplitude: 22 mV
MDC IDC MSMT BATTERY IMPEDANCE: 100 Ohm
MDC IDC MSMT BATTERY REMAINING LONGEVITY: 129 mo
MDC IDC MSMT BATTERY VOLTAGE: 2.8 V
MDC IDC MSMT LEADCHNL RA IMPEDANCE VALUE: 0 Ohm
MDC IDC MSMT LEADCHNL RV PACING THRESHOLD PULSEWIDTH: 0.4 ms
MDC IDC SET LEADCHNL RV PACING AMPLITUDE: 3.5 V
MDC IDC SET LEADCHNL RV PACING PULSEWIDTH: 0.4 ms
MDC IDC SET LEADCHNL RV SENSING SENSITIVITY: 4 mV

## 2015-01-07 NOTE — Progress Notes (Signed)
Wound check appointment. Steri-strips removed. Wound without redness or edema. Incision edges approximated, wound well healed. Normal device function. Threshold, sensing, and impedance consistent with implant measurements. Device programmed at 3.5V for extra safety margin until 3 month visit. Histogram distribution appropriate for patient and level of activity. No high ventricular rates noted. Patient educated about wound care, arm mobility, lifting restrictions. ROV with GT/Rds 03/31/15.

## 2015-01-07 NOTE — Patient Instructions (Signed)
Medication Instructions:  Your physician recommends that you continue on your current medications as directed. Please refer to the Current Medication list given to you today.   Labwork: NONE  Testing/Procedures: NONE  Follow-Up: FOLLOW UP WITH DR. Darl Householder IN THE [facility] OFFICE IN ABOUT 3-4 WEEKS  Any Other Special Instructions Will Be Listed Below (If Applicable).

## 2015-01-10 NOTE — Progress Notes (Signed)
Received records from patient's primary care provider.  Labs collected 01/03/15: BUN 26, creatinine 1.17, Na 137, K 4, albumin 2.9, ALP 212, AST 85, ALT 28, Hgb 11.4, PLT 90 K. Patient's primary care provider has increased his torsemide back to 20 mg twice a day and plans to follow his renal function and potassium closely with repeat labs in several days. Tereso Newcomer, PA-C   01/10/2015 11:31 AM

## 2015-01-27 ENCOUNTER — Other Ambulatory Visit: Payer: Self-pay

## 2015-01-27 MED ORDER — CLOPIDOGREL BISULFATE 75 MG PO TABS: 75.0000 mg | ORAL_TABLET | Freq: Every day | ORAL | Status: DC

## 2015-01-27 NOTE — Telephone Encounter (Signed)
plavix refilled  

## 2015-02-04 ENCOUNTER — Encounter: Payer: Self-pay | Admitting: Internal Medicine

## 2015-02-10 ENCOUNTER — Encounter: Payer: Self-pay | Admitting: Cardiovascular Disease

## 2015-02-10 ENCOUNTER — Ambulatory Visit (INDEPENDENT_AMBULATORY_CARE_PROVIDER_SITE_OTHER): Payer: Medicare Other | Admitting: Cardiovascular Disease

## 2015-02-10 VITALS — BP 110/62 | HR 79 | Ht 71.0 in | Wt 226.0 lb

## 2015-02-10 DIAGNOSIS — I5032 Chronic diastolic (congestive) heart failure: Secondary | ICD-10-CM

## 2015-02-10 DIAGNOSIS — I10 Essential (primary) hypertension: Secondary | ICD-10-CM | POA: Diagnosis not present

## 2015-02-10 DIAGNOSIS — I482 Chronic atrial fibrillation, unspecified: Secondary | ICD-10-CM

## 2015-02-10 DIAGNOSIS — E785 Hyperlipidemia, unspecified: Secondary | ICD-10-CM

## 2015-02-10 DIAGNOSIS — Z950 Presence of cardiac pacemaker: Secondary | ICD-10-CM

## 2015-02-10 DIAGNOSIS — I25812 Atherosclerosis of bypass graft of coronary artery of transplanted heart without angina pectoris: Secondary | ICD-10-CM | POA: Diagnosis not present

## 2015-02-10 DIAGNOSIS — Z954 Presence of other heart-valve replacement: Secondary | ICD-10-CM

## 2015-02-10 DIAGNOSIS — Z952 Presence of prosthetic heart valve: Secondary | ICD-10-CM

## 2015-02-10 DIAGNOSIS — R600 Localized edema: Secondary | ICD-10-CM

## 2015-02-10 NOTE — Progress Notes (Signed)
Patient ID: [name], male   DOB: 13-Mar-1946, 69 y.o.   MRN: [id number]      SUBJECTIVE: The patient presents for routine cardiovascular follow-up. He has a history of chronic diastolic heart failure, atrial fibrillation and flutter, CABG, and TAVR.  He was hospitalized for acute on chronic diastolic heart failure in June 2016.   He was then hospitalized in late August for profound bradycardia and underwent implantation of a single-chamber pacemaker by Dr. Ladona Ridgel on 12/27/14.  Echocardiogram on 12/28/14 demonstrated normal left ventricular systolic function, LVEF 55-60%, normal regional wall motion, mean aortic valve gradient 20 mmHg, and mild pulmonary hypertension, PASP 38 mmHg.  He saw Tereso Newcomer PA-C in September who increased his torsemide to 40 mg.  He is down to 226 lbs. He feels well. He has a dry, mildly productive cough. Leg swelling is stable. Denies chest pain.  Taking lactulose and his wife says ammonia level is now at 60 from 88.  Review of Systems: As per "subjective", otherwise negative.  No Known Allergies  Current Outpatient Prescriptions  Medication Sig Dispense Refill  . albuterol-ipratropium (COMBIVENT) 18-103 MCG/ACT inhaler Inhale 2 puffs into the lungs every 6 (six) hours as needed for wheezing or shortness of breath.     . Artificial Tear Ointment (DRY EYES OP) Apply 1 drop to eye daily as needed (dry eyes).    Marland Kitchen ascorbic acid (VITAMIN C) 500 MG tablet Take 500 mg by mouth daily.     Marland Kitchen aspirin 81 MG tablet Take 1 tablet (81 mg total) by mouth daily.    . budesonide (PULMICORT) 0.25 MG/2ML nebulizer solution Take 0.25 mg by nebulization 2 (two) times daily.    . buprenorphine (BUTRANS) 10 MCG/HR PTWK patch Place 10 mcg onto the skin once a week.    . cetirizine (ZYRTEC) 10 MG tablet Take 10 mg by mouth at bedtime.    . clopidogrel (PLAVIX) 75 MG tablet Take 1 tablet (75 mg total) by mouth daily. 90 tablet 3  . co-enzyme Q-10 30 MG capsule Take 30 mg by  mouth at bedtime.     Marland Kitchen desonide (DESOWEN) 0.05 % cream Apply 1 application topically 2 (two) times daily as needed (for rash).     Marland Kitchen diltiazem (CARDIZEM CD) 120 MG 24 hr capsule Take 1 capsule (120 mg total) by mouth daily. 90 capsule 3  . ezetimibe (ZETIA) 10 MG tablet Take 10 mg by mouth at bedtime.     . flunisolide (NASAREL) 29 MCG/ACT (0.025%) nasal spray Place 2 sprays into the nose at bedtime as needed for rhinitis (for congestion). Dose is for each nostril.    . insulin NPH-regular Human (NOVOLIN 70/30) (70-30) 100 UNIT/ML injection Inject 15 Units into the skin 2 (two) times daily. (Patient taking differently: Inject 30-40 Units into the skin 2 (two) times daily. 40 as a base; <180 = 40 units) 10 mL 11  . ipratropium-albuterol (DUONEB) 0.5-2.5 (3) MG/3ML SOLN Take 3 mLs by nebulization every 6 (six) hours as needed (for shortness of breath).    Marland Kitchen ketoconazole (NIZORAL) 2 % cream Apply 1 application topically 2 (two) times daily as needed for irritation.     Marland Kitchen KRISTALOSE 20 G packet Take 20 g by mouth 2 (two) times daily.    Marland Kitchen loratadine (CLARITIN) 10 MG tablet Take 10 mg by mouth every morning.     . metoprolol succinate (TOPROL-XL) 25 MG 24 hr tablet Take 1 tablet (25 mg total) by mouth daily.    Marland Kitchen  Multiple Vitamin (MULTIVITAMIN) tablet Take 1 tablet by mouth daily.      Marland Kitchen oxyCODONE-acetaminophen (PERCOCET/ROXICET) 5-325 MG per tablet Take 1-2 tablets by mouth every 4 (four) hours as needed for moderate pain. (Patient taking differently: Take 1 tablet by mouth every 4 (four) hours as needed for moderate pain. ) 40 tablet 0  . potassium chloride SA (K-DUR,KLOR-CON) 20 MEQ tablet Take 20 mEq by mouth 2 (two) times daily.    . predniSONE (DELTASONE) 5 MG tablet Take 5 mg by mouth daily with breakfast.    . rifaximin (XIFAXAN) 550 MG TABS tablet Take 550 mg by mouth 2 (two) times daily.    Marland Kitchen torsemide (DEMADEX) 20 MG tablet Take 20 mg by mouth 2 (two) times daily.     No current  facility-administered medications for this visit.    Past Medical History  Diagnosis Date  . COPD (chronic obstructive pulmonary disease) (HCC)   . Elevated liver function tests   . Hypertension   . Aortic stenosis     a. 2D ECHO: 01/30/2014: ef 50-55%. No RWMAs. Severe RW:ERXVQ area (VTI): 0.82 cm^2. Valve area (Vmax): 0.74 cm^2. Valve area (Vmean): 0.75 Cm^2. Mod LA/RA dilation. PA peak pressure 33  . CAD (coronary artery disease)   . GERD (gastroesophageal reflux disease)   . Chronic atrial flutter (HCC)   . 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 Morbid obesity (HCC)   . Coronary atherosclerosis of artery bypass graft 2010  . Acute on chronic diastolic heart failure (HCC) 02/01/2014  . Chronic diastolic congestive heart failure (HCC) 02/01/2014  . Epistaxis     Developed on Xarelto therapy  . Cirrhosis (HCC) 02/01/2014  . Portal hypertension (HCC) 02/01/2014  . Chronic pancreatitis (HCC) 02/01/2014    Noted on CT scan  . Shortness of breath   . Dizziness   . Heart murmur   . Asthma     uses nebulizers daily  . Dysrhythmia     fib/flutter  . Anemia     was on xalerto sept 2015 for afib - was taken off of - had to get unit of prbc  . History of blood transfusion 01/2014    "right after starting Xeralto"  . 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 Complication of anesthesia     "has a hard time breathing when he wakes up"  . Hyperlipidemia   . Pneumonia "several times:  . Type II diabetes mellitus (HCC)   . Rheumatoid arthritis(714.0)     "everywhere"  . Degenerative disc disease, cervical   . Degenerative disc disease, lumbar 2010  . Osteoarthritis     "everywhere"  . Chronic back pain     "lower back usually; upper back fior the last couple weeks"  (12/26/2014)  . ARF  (acute renal failure) (HCC) 11/2014  . On home oxygen therapy     "2.5L at night & prn during the day" (12/26/2014)  . Other myositis, left thigh dx'd 12/26/2014  . Acute renal failure (HCC) 12/30/2014  . Atrial fibrillation (HCC) 12/30/2014  . 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 Hyposmolality and/or hyponatremia 12/30/2014  . CHB (complete heart block) (HCC) 12/30/2014  . Elevated troponin level, secondary to CHB acute  renal failure 12/30/2014    Past Surgical History  Procedure Laterality Date  . Coronary angioplasty with stent placement  01/02/2009    s/p bare metal stent to the vein graft to the RCA 9/20. nml LV function   . Appendectomy  1950  . Coronary artery bypass graft  03/27/1997    CABG x 5 by Dr Cornelius Moras  . Anterior cervical decomp/discectomy fusion  12/2006  . Tonsillectomy and adenoidectomy  1950  . Lumbar disc surgery  2011    "put plastic wedge in"  . Esophagogastroduodenoscopy (egd) with propofol N/A 02/01/2014    Procedure: ESOPHAGOGASTRODUODENOSCOPY (EGD) WITH PROPOFOL;  Surgeon: Shirley Friar, MD;  Location: [facility] ENDOSCOPY;  Service: Endoscopy;  Laterality: N/A;  . Colonoscopy with propofol N/A 02/01/2014    Procedure: COLONOSCOPY WITH PROPOFOL;  Surgeon: Shirley Friar, MD;  Location: [facility] ENDOSCOPY;  Service: Endoscopy;  Laterality: N/A;  . Multiple extractions with alveoloplasty N/A 03/12/2014    Procedure: MULTIPLE EXTRACTION OF TOOTH #'S 2,6,7,8,9,10,11,14,18,20,29,31 WITH ALVEOLOPLASTY AND GROSS DEBRIDMENT OF REMAINING TEETH;  Surgeon: Charlynne Pander, DDS;  Location: [facility] OR;  Service: Oral Surgery;  Laterality: N/A;  . Back surgery    . Transcatheter aortic valve replacement, transfemoral N/A 04/03/2014    Procedure: TRANSCATHETER AORTIC VALVE REPLACEMENT, TRANSFEMORAL;  Surgeon: Purcell Nails, MD;  Location: [facility] OR;  Service: Open Heart Surgery;  Laterality: N/A;  . Intraoperative transesophageal echocardiogram N/A  04/03/2014    Procedure: INTRAOPERATIVE TRANSESOPHAGEAL ECHOCARDIOGRAM;  Surgeon: Purcell Nails, MD;  Location: [facility] OR;  Service: Open Heart Surgery;  Laterality: N/A;  . Left and right heart catheterization with coronary/graft angiogram N/A 02/05/2014    Procedure: LEFT AND RIGHT HEART CATHETERIZATION WITH Isabel Caprice;  Surgeon: Peter M Swaziland, MD;  Location: [facility] CATH LAB;  Service: Cardiovascular;  Laterality: N/A;  . Posterior laminectomy / decompression cervical spine  05/2007  . Ep implantable device N/A 12/27/2014    Procedure: Pacemaker Implant;  Surgeon: Marinus Maw, MD;  Location: [facility] INVASIVE CV LAB;  Service: Cardiovascular;  Laterality: N/A;    Social History   Social History  . Marital Status: Married    Spouse Name: N/A  . Number of Children: N/A  . Years of Education: N/A   Occupational History  . Retired Special educational needs teacher    Social History Main Topics  . Smoking status: Former Smoker -- 3.00 packs/day for 35 years    Types: Cigarettes    Start date: 09/15/1961    Quit date: 11/23/1996  . Smokeless tobacco: Current User    Types: Chew  . Alcohol Use: No  . Drug Use: No  . Sexual Activity: Yes   Other Topics Concern  . Not on file   Social History Narrative   Married, retired.      Filed Vitals:   02/10/15 1031  BP: 110/62  Pulse: 79  Height: 5\' 11"  (1.803 m)  Weight: 226 lb (102.513 kg)  SpO2: 92%    PHYSICAL EXAM General: NAD HEENT: Normal. Neck: No JVD, no thyromegaly. Lungs: Faint dry crackles at bases with normal respiratory effort. CV: Regular rate and rhythm, normal S1/S2, no S3, no murmur. Trivial pretibial and periankle edema.  Abdomen: Soft, non tender, no distention.  Neurologic: Alert and oriented.  Psych: Normal affect. Skin: Normal. Musculoskeletal: No gross deformities. Extremities: No clubbing or cyanosis.   ECG: Most recent ECG reviewed.      ASSESSMENT AND PLAN: 1. CAD: No anginal discomfort. Has SVG stenosis,  for  which medical therapy was recommended. Will continue Plavix, ASA, Zetia, and metoprolol.  2. Essential HTN: Stable. No changes.  3. Hyperlipidemia: On Zetia. Followed by PCP. Intolerant to Lipitor.   4. Paroxysmal atrial flutter/atrial fibrillation: Now paced. Symptomatically stable. On long-acting diltiazem and metoprolol. He is a poor candidate for anticoagulation given portal hypertension with esophageal varices. Will continue ASA and Plavix for now.   5. Severe aortic stenosis s/p TAVR: Echocardiogram 12/2014 showed a normally functioning prosthetic aortic valve and normal LV systolic function.   6. COPD: Followed by pulmonary. Appears to be stable.   7. Chronic diastolic heart failure: Euvolemic. Continue torsemide 40 mg.  8. Symptomatic bradycardia s/p pacemaker: Stable. Follows with Dr. Ladona Ridgel.  Dispo: f/u February 2017.   Prentice Docker, M.D., F.A.C.C.

## 2015-02-10 NOTE — Patient Instructions (Signed)
Continue all current medications. Your physician wants you to follow up in:  4 months.  You will receive a reminder letter in the mail one-two months in advance.  If you don't receive a letter, please call our office to schedule the follow up appointment   

## 2015-03-10 ENCOUNTER — Other Ambulatory Visit: Payer: Self-pay | Admitting: *Deleted

## 2015-03-10 MED ORDER — CLOPIDOGREL BISULFATE 75 MG PO TABS: 75.0000 mg | ORAL_TABLET | Freq: Every day | ORAL | Status: AC

## 2015-03-31 ENCOUNTER — Ambulatory Visit (INDEPENDENT_AMBULATORY_CARE_PROVIDER_SITE_OTHER): Payer: Medicare Other | Admitting: Internal Medicine

## 2015-03-31 ENCOUNTER — Encounter: Payer: Self-pay | Admitting: Internal Medicine

## 2015-03-31 VITALS — BP 150/90 | HR 91 | Ht 71.0 in | Wt 225.6 lb

## 2015-03-31 DIAGNOSIS — I482 Chronic atrial fibrillation, unspecified: Secondary | ICD-10-CM

## 2015-03-31 DIAGNOSIS — Z950 Presence of cardiac pacemaker: Secondary | ICD-10-CM

## 2015-03-31 DIAGNOSIS — I5032 Chronic diastolic (congestive) heart failure: Secondary | ICD-10-CM

## 2015-03-31 DIAGNOSIS — I10 Essential (primary) hypertension: Secondary | ICD-10-CM

## 2015-03-31 DIAGNOSIS — I25812 Atherosclerosis of bypass graft of coronary artery of transplanted heart without angina pectoris: Secondary | ICD-10-CM

## 2015-03-31 LAB — CUP PACEART INCLINIC DEVICE CHECK
Battery Remaining Longevity: 130 mo
Brady Statistic RV Percent Paced: 19 %
Date Time Interrogation Session: 20161128092811
Implantable Lead Model: 5076
Lead Channel Impedance Value: 0 Ohm
Lead Channel Impedance Value: 530 Ohm
Lead Channel Setting Pacing Pulse Width: 0.4 ms
Lead Channel Setting Sensing Sensitivity: 4 mV
MDC IDC LEAD IMPLANT DT: 20160826
MDC IDC LEAD LOCATION: 753860
MDC IDC MSMT BATTERY IMPEDANCE: 100 Ohm
MDC IDC MSMT BATTERY VOLTAGE: 2.8 V
MDC IDC MSMT LEADCHNL RV PACING THRESHOLD AMPLITUDE: 0.625 V
MDC IDC MSMT LEADCHNL RV PACING THRESHOLD AMPLITUDE: 0.75 V
MDC IDC MSMT LEADCHNL RV PACING THRESHOLD PULSEWIDTH: 0.4 ms
MDC IDC MSMT LEADCHNL RV PACING THRESHOLD PULSEWIDTH: 0.4 ms
MDC IDC MSMT LEADCHNL RV SENSING INTR AMPL: 15.67 mV
MDC IDC SET LEADCHNL RV PACING AMPLITUDE: 2.5 V

## 2015-03-31 NOTE — Assessment & Plan Note (Signed)
His ventricular rate is well controlled. No change in medications. 

## 2015-03-31 NOTE — Assessment & Plan Note (Signed)
His blood pressure is increased today. He did not take his morning meds. Will follow.

## 2015-03-31 NOTE — Assessment & Plan Note (Signed)
His medtronic VVI PM is working normally. Will recheck in several months. 

## 2015-03-31 NOTE — Patient Instructions (Signed)
Your physician wants you to follow-up in: 9 Months with Dr. Ladona Ridgel. You will receive a reminder letter in the mail two months in advance. If you don't receive a letter, please call our office to schedule the follow-up appointment.  Remote monitoring is used to monitor your Pacemaker of ICD from home. This monitoring reduces the number of office visits required to check your device to one time per year. It allows [place] to keep an eye on the functioning of your device to ensure it is working properly. You are scheduled for a device check from home on 06/30/15. You may send your transmission at any time that day. If you have a wireless device, the transmission will be sent automatically. After your physician reviews your transmission, you will receive a postcard with your next transmission date.  Your physician recommends that you continue on your current medications as directed. Please refer to the Current Medication list given to you today.  If you need a refill on your cardiac medications before your next appointment, please call your pharmacy.  Thank you for choosing [facility]!

## 2015-03-31 NOTE — Assessment & Plan Note (Signed)
His symptoms are class 2. He is fairly sedentary. He will continue his current meds.

## 2015-03-31 NOTE — Progress Notes (Signed)
HPI Mr. Wolman returns today for PPM followup. He is a pleasant 69 yo man with multiple medical problems including AS who underwent TAVR and developed symptomatic bradycardia due to heart block and underwent PPM insertion several months ago. In the interim, he has been stable. He admits to being sedentary as he has back and leg pain. No chest pain or sob.  No Known Allergies   Current Outpatient Prescriptions  Medication Sig Dispense Refill  . albuterol-ipratropium (COMBIVENT) 18-103 MCG/ACT inhaler Inhale 2 puffs into the lungs every 6 (six) hours as needed for wheezing or shortness of breath.     . Artificial Tear Ointment (DRY EYES OP) Apply 1 drop to eye daily as needed (dry eyes).    Marland Kitchen ascorbic acid (VITAMIN C) 500 MG tablet Take 500 mg by mouth daily.     Marland Kitchen aspirin 81 MG tablet Take 1 tablet (81 mg total) by mouth daily.    . budesonide (PULMICORT) 0.25 MG/2ML nebulizer solution Take 0.25 mg by nebulization 2 (two) times daily.    . buprenorphine (BUTRANS) 10 MCG/HR PTWK patch Place 10 mcg onto the skin once a week.    . cetirizine (ZYRTEC) 10 MG tablet Take 10 mg by mouth at bedtime.    . clopidogrel (PLAVIX) 75 MG tablet Take 1 tablet (75 mg total) by mouth daily. 90 tablet 3  . co-enzyme Q-10 30 MG capsule Take 30 mg by mouth at bedtime.     Marland Kitchen desonide (DESOWEN) 0.05 % cream Apply 1 application topically 2 (two) times daily as needed (for rash).     Marland Kitchen diltiazem (CARDIZEM CD) 120 MG 24 hr capsule Take 1 capsule (120 mg total) by mouth daily. 90 capsule 3  . ezetimibe (ZETIA) 10 MG tablet Take 10 mg by mouth at bedtime.     . flunisolide (NASAREL) 29 MCG/ACT (0.025%) nasal spray Place 2 sprays into the nose at bedtime as needed for rhinitis (for congestion). Dose is for each nostril.    . insulin NPH-regular Human (NOVOLIN 70/30) (70-30) 100 UNIT/ML injection Inject 15 Units into the skin 2 (two) times daily. (Patient taking differently: Inject 30-40 Units into the skin 2 (two)  times daily. 40 as a base; <180 = 40 units) 10 mL 11  . ipratropium-albuterol (DUONEB) 0.5-2.5 (3) MG/3ML SOLN Take 3 mLs by nebulization every 6 (six) hours as needed (for shortness of breath).    Marland Kitchen ketoconazole (NIZORAL) 2 % cream Apply 1 application topically 2 (two) times daily as needed for irritation.     Marland Kitchen KRISTALOSE 20 G packet Take 20 g by mouth 2 (two) times daily.    Marland Kitchen loratadine (CLARITIN) 10 MG tablet Take 10 mg by mouth every morning.     . metoprolol succinate (TOPROL-XL) 25 MG 24 hr tablet Take 1 tablet (25 mg total) by mouth daily.    . Multiple Vitamin (MULTIVITAMIN) tablet Take 1 tablet by mouth daily.      Marland Kitchen oxyCODONE-acetaminophen (PERCOCET/ROXICET) 5-325 MG per tablet Take 1-2 tablets by mouth every 4 (four) hours as needed for moderate pain. (Patient taking differently: Take 1 tablet by mouth every 4 (four) hours as needed for moderate pain. ) 40 tablet 0  . potassium chloride SA (K-DUR,KLOR-CON) 20 MEQ tablet Take 20 mEq by mouth 2 (two) times daily.    . predniSONE (DELTASONE) 5 MG tablet Take 5 mg by mouth daily with breakfast.    . rifaximin (XIFAXAN) 550 MG TABS tablet Take 550 mg  by mouth 2 (two) times daily.    Marland Kitchen torsemide (DEMADEX) 20 MG tablet Take 20 mg by mouth 2 (two) times daily.     No current facility-administered medications for this visit.     Past Medical History  Diagnosis Date  . COPD (chronic obstructive pulmonary disease) (HCC)   . Elevated liver function tests   . Hypertension   . Aortic stenosis     a. 2D ECHO: 01/30/2014: ef 50-55%. No RWMAs. Severe ZO:XWRUE area (VTI): 0.82 cm^2. Valve area (Vmax): 0.74 cm^2. Valve area (Vmean): 0.75 Cm^2. Mod LA/RA dilation. PA peak pressure 33  . CAD (coronary artery disease)   . GERD (gastroesophageal reflux disease)   . Chronic atrial flutter (HCC)   . 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 Morbid obesity (HCC)     . Coronary atherosclerosis of artery bypass graft 2010  . Acute on chronic diastolic heart failure (HCC) 02/01/2014  . Chronic diastolic congestive heart failure (HCC) 02/01/2014  . Epistaxis     Developed on Xarelto therapy  . Cirrhosis (HCC) 02/01/2014  . Portal hypertension (HCC) 02/01/2014  . Chronic pancreatitis (HCC) 02/01/2014    Noted on CT scan  . Shortness of breath   . Dizziness   . Heart murmur   . Asthma     uses nebulizers daily  . Dysrhythmia     fib/flutter  . Anemia     was on xalerto sept 2015 for afib - was taken off of - had to get unit of prbc  . History of blood transfusion 01/2014    "right after starting Xeralto"  . 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 Complication of anesthesia     "has a hard time breathing when he wakes up"  . Hyperlipidemia   . Pneumonia "several times:  . Type II diabetes mellitus (HCC)   . Rheumatoid arthritis(714.0)     "everywhere"  . Degenerative disc disease, cervical   . Degenerative disc disease, lumbar 2010  . Osteoarthritis     "everywhere"  . Chronic back pain     "lower back usually; upper back fior the last couple weeks"  (12/26/2014)  . ARF (acute renal failure) (HCC) 11/2014  . On home oxygen therapy     "2.5L at night & prn during the day" (12/26/2014)  . Other myositis, left thigh dx'd 12/26/2014  . Acute renal failure (HCC) 12/30/2014  . Atrial fibrillation (HCC) 12/30/2014  . 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 Hyposmolality and/or hyponatremia 12/30/2014  . CHB (complete heart block) (HCC) 12/30/2014  . Elevated troponin level, secondary to CHB acute renal failure 12/30/2014    ROS:   All systems reviewed and negative except as noted in the HPI.   Past Surgical History  Procedure Laterality Date  . Coronary angioplasty with stent placement  01/02/2009     s/p bare metal stent to the vein graft to the RCA 9/20. nml LV function   . Appendectomy  1950  . Coronary artery bypass graft  03/27/1997    CABG x 5 by Dr Cornelius Moras  . Anterior cervical decomp/discectomy fusion  12/2006  . Tonsillectomy and adenoidectomy  1950  . Lumbar disc surgery  2011    "  put plastic wedge in"  . Esophagogastroduodenoscopy (egd) with propofol N/A 02/01/2014    Procedure: ESOPHAGOGASTRODUODENOSCOPY (EGD) WITH PROPOFOL;  Surgeon: Shirley Friar, MD;  Location: [facility] ENDOSCOPY;  Service: Endoscopy;  Laterality: N/A;  . Colonoscopy with propofol N/A 02/01/2014    Procedure: COLONOSCOPY WITH PROPOFOL;  Surgeon: Shirley Friar, MD;  Location: [facility] ENDOSCOPY;  Service: Endoscopy;  Laterality: N/A;  . Multiple extractions with alveoloplasty N/A 03/12/2014    Procedure: MULTIPLE EXTRACTION OF TOOTH #'S 2,6,7,8,9,10,11,14,18,20,29,31 WITH ALVEOLOPLASTY AND GROSS DEBRIDMENT OF REMAINING TEETH;  Surgeon: Charlynne Pander, DDS;  Location: [facility] OR;  Service: Oral Surgery;  Laterality: N/A;  . Back surgery    . Transcatheter aortic valve replacement, transfemoral N/A 04/03/2014    Procedure: TRANSCATHETER AORTIC VALVE REPLACEMENT, TRANSFEMORAL;  Surgeon: Purcell Nails, MD;  Location: [facility] OR;  Service: Open Heart Surgery;  Laterality: N/A;  . Intraoperative transesophageal echocardiogram N/A 04/03/2014    Procedure: INTRAOPERATIVE TRANSESOPHAGEAL ECHOCARDIOGRAM;  Surgeon: Purcell Nails, MD;  Location: [facility] OR;  Service: Open Heart Surgery;  Laterality: N/A;  . Left and right heart catheterization with coronary/graft angiogram N/A 02/05/2014    Procedure: LEFT AND RIGHT HEART CATHETERIZATION WITH Isabel Caprice;  Surgeon: Peter M Swaziland, MD;  Location: [facility] CATH LAB;  Service: Cardiovascular;  Laterality: N/A;  . Posterior laminectomy / decompression cervical spine  05/2007  . Ep implantable device N/A 12/27/2014    Procedure: Pacemaker Implant;  Surgeon: Marinus Maw, MD;   Location: [facility] INVASIVE CV LAB;  Service: Cardiovascular;  Laterality: N/A;     Family History  Problem Relation Age of Onset  . Hypertension Mother   . Cancer Father   . Stroke Brother   . Liver cancer Paternal Grandfather   . Rheum arthritis Maternal Aunt   . Asthma Child   . Emphysema Brother     smoked     Social History   Social History  . Marital Status: Married    Spouse Name: N/A  . Number of Children: N/A  . Years of Education: N/A   Occupational History  . Retired Special educational needs teacher    Social History Main Topics  . Smoking status: Former Smoker -- 3.00 packs/day for 35 years    Types: Cigarettes    Start date: 09/15/1961    Quit date: 11/23/1996  . Smokeless tobacco: Current User    Types: Chew  . Alcohol Use: No  . Drug Use: No  . Sexual Activity: Yes   Other Topics Concern  . Not on file   Social History Narrative   Married, retired.      BP 150/90 mmHg  Pulse 91  Ht 5\' 11"  (1.803 m)  Wt 225 lb 9.6 oz (102.331 kg)  BMI 31.48 kg/m2  SpO2 93%  Physical Exam:  stable appearing 69 yo man, NAD HEENT: Unremarkable Neck:  6 cm JVD, no thyromegally Lymphatics:  No adenopathy Back:  No CVA tenderness Lungs:  Clear with no wheezes; well healed PPM incision. HEART:  Regular rate rhythm, no murmurs, no rubs, no clicks Abd:  soft, positive bowel sounds, no organomegally, no rebound, no guarding Ext:  2 plus pulses, no edema, no cyanosis, no clubbing Skin:  No rashes no nodules Neuro:  CN II through XII intact, motor grossly intact  DEVICE  Normal device function.  See PaceArt for details.   Assess/Plan:

## 2015-04-01 ENCOUNTER — Encounter: Payer: Self-pay | Admitting: Internal Medicine

## 2015-04-09 ENCOUNTER — Encounter: Payer: Self-pay | Admitting: Internal Medicine

## 2015-04-14 ENCOUNTER — Other Ambulatory Visit: Payer: Self-pay

## 2015-04-14 ENCOUNTER — Ambulatory Visit (INDEPENDENT_AMBULATORY_CARE_PROVIDER_SITE_OTHER): Payer: Medicare Other | Admitting: Cardiovascular Disease

## 2015-04-14 ENCOUNTER — Ambulatory Visit (HOSPITAL_COMMUNITY): Payer: Medicare Other | Attending: Cardiovascular Disease

## 2015-04-14 ENCOUNTER — Encounter: Payer: Self-pay | Admitting: Cardiovascular Disease

## 2015-04-14 VITALS — BP 114/58 | HR 70 | Ht 71.0 in | Wt 228.0 lb

## 2015-04-14 DIAGNOSIS — I517 Cardiomegaly: Secondary | ICD-10-CM | POA: Insufficient documentation

## 2015-04-14 DIAGNOSIS — E119 Type 2 diabetes mellitus without complications: Secondary | ICD-10-CM | POA: Insufficient documentation

## 2015-04-14 DIAGNOSIS — I25812 Atherosclerosis of bypass graft of coronary artery of transplanted heart without angina pectoris: Secondary | ICD-10-CM

## 2015-04-14 DIAGNOSIS — Z954 Presence of other heart-valve replacement: Secondary | ICD-10-CM

## 2015-04-14 DIAGNOSIS — I350 Nonrheumatic aortic (valve) stenosis: Secondary | ICD-10-CM | POA: Diagnosis present

## 2015-04-14 DIAGNOSIS — Z6831 Body mass index (BMI) 31.0-31.9, adult: Secondary | ICD-10-CM | POA: Diagnosis not present

## 2015-04-14 DIAGNOSIS — Z952 Presence of prosthetic heart valve: Secondary | ICD-10-CM

## 2015-04-14 DIAGNOSIS — Z951 Presence of aortocoronary bypass graft: Secondary | ICD-10-CM | POA: Insufficient documentation

## 2015-04-14 DIAGNOSIS — I10 Essential (primary) hypertension: Secondary | ICD-10-CM | POA: Diagnosis not present

## 2015-04-14 DIAGNOSIS — I071 Rheumatic tricuspid insufficiency: Secondary | ICD-10-CM | POA: Diagnosis not present

## 2015-04-14 DIAGNOSIS — E785 Hyperlipidemia, unspecified: Secondary | ICD-10-CM | POA: Diagnosis not present

## 2015-04-14 DIAGNOSIS — I340 Nonrheumatic mitral (valve) insufficiency: Secondary | ICD-10-CM | POA: Insufficient documentation

## 2015-04-14 DIAGNOSIS — Z87891 Personal history of nicotine dependence: Secondary | ICD-10-CM | POA: Diagnosis not present

## 2015-04-14 NOTE — Patient Instructions (Signed)

## 2015-04-14 NOTE — Progress Notes (Signed)
Cardiology Office Note Date:  04/14/2015   ID:  [name], DOB [birth year], MRN [id number]  PCP:  Donzetta Sprung, MD  Cardiologist:  Tonny Bollman, MD    Chief Complaint  Patient presents with  . Annual Exam    History of Present Illness: [name] is a 69 y.o. male who presents for follow-up of s/p TAVR 1 year ago. He was treated with a 29 mm Sapien 3 transcatheter heart valve via a transfemoral approach.   He continues to have DOE that he attributes to COPD.  He says this is "not a bad problem."  He sits down and does a breathing treatment and it resolves.  Chest "ache" that he feels is gas related to taking lactulose.  He denies chest pain or pressure.  He gets lightheaded when he stands too quickly but says this is not happening often.  He had PPM placed 3 months ago for symptomatic bradycardia due to heart block.  He was hospitalized earlier this year with acute renal failure on background of CKD III, improved with fluids and supportive care.   He is on DAPT for PAF because he has high bleeding risk due to portal HTN with esophageal varices.  He reports easy bruising but no significant bleeding.  The patient is here with his wife today and overall they feel is doing better than he was before TAVR, despite several hospitalizations over the past year.   Past Medical History  Diagnosis Date  . COPD (chronic obstructive pulmonary disease) (HCC)   . Elevated liver function tests   . Hypertension   . Aortic stenosis     a. 2D ECHO: 01/30/2014: ef 50-55%. No RWMAs. Severe NU:UVOZD area (VTI): 0.82 cm^2. Valve area (Vmax): 0.74 cm^2. Valve area (Vmean): 0.75 Cm^2. Mod LA/RA dilation. PA peak pressure 33  . CAD (coronary artery disease)   . GERD (gastroesophageal reflux disease)   . Chronic atrial flutter (HCC)   . 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 Morbid obesity (HCC)   .  Coronary atherosclerosis of artery bypass graft 2010  . Acute on chronic diastolic heart failure (HCC) 02/01/2014  . Chronic diastolic congestive heart failure (HCC) 02/01/2014  . Epistaxis     Developed on Xarelto therapy  . Cirrhosis (HCC) 02/01/2014  . Portal hypertension (HCC) 02/01/2014  . Chronic pancreatitis (HCC) 02/01/2014    Noted on CT scan  . Shortness of breath   . Dizziness   . Heart murmur   . Asthma     uses nebulizers daily  . Dysrhythmia     fib/flutter  . Anemia     was on xalerto sept 2015 for afib - was taken off of - had to get unit of prbc  . History of blood transfusion 01/2014    "right after starting Xeralto"  . 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 Complication of anesthesia     "has a hard time breathing when he wakes up"  . Hyperlipidemia   . Pneumonia "several times:  . Type II diabetes mellitus (HCC)   . Rheumatoid arthritis(714.0)     "everywhere"  . Degenerative disc disease, cervical   . Degenerative disc disease, lumbar 2010  . Osteoarthritis     "  everywhere"  . Chronic back pain     "lower back usually; upper back fior the last couple weeks"  (12/26/2014)  . ARF (acute renal failure) (HCC) 11/2014  . On home oxygen therapy     "2.5L at night & prn during the day" (12/26/2014)  . Other myositis, left thigh dx'd 12/26/2014  . Acute renal failure (HCC) 12/30/2014  . Atrial fibrillation (HCC) 12/30/2014  . 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 Hyposmolality and/or hyponatremia 12/30/2014  . CHB (complete heart block) (HCC) 12/30/2014  . Elevated troponin level, secondary to CHB acute renal failure 12/30/2014    Past Surgical History  Procedure Laterality Date  . Coronary angioplasty with stent placement  01/02/2009    s/p bare metal stent to the vein graft to the RCA 9/20. nml LV function   .  Appendectomy  1950  . Coronary artery bypass graft  03/27/1997    CABG x 5 by Dr Cornelius Moras  . Anterior cervical decomp/discectomy fusion  12/2006  . Tonsillectomy and adenoidectomy  1950  . Lumbar disc surgery  2011    "put plastic wedge in"  . Esophagogastroduodenoscopy (egd) with propofol N/A 02/01/2014    Procedure: ESOPHAGOGASTRODUODENOSCOPY (EGD) WITH PROPOFOL;  Surgeon: Shirley Friar, MD;  Location: [facility] ENDOSCOPY;  Service: Endoscopy;  Laterality: N/A;  . Colonoscopy with propofol N/A 02/01/2014    Procedure: COLONOSCOPY WITH PROPOFOL;  Surgeon: Shirley Friar, MD;  [facility] ENDOSCOPY;  Service: Endoscopy;  Laterality: N/A;  . Multiple extractions with alveoloplasty N/A 03/12/2014    Procedure: MULTIPLE EXTRACTION OF TOOTH #'S 2,6,7,8,9,10,11,14,18,20,29,31 WITH ALVEOLOPLASTY AND GROSS DEBRIDMENT OF REMAINING TEETH;  Surgeon: Charlynne Pander, DDS;  Location: [facility] OR;  Service: Oral Surgery;  Laterality: N/A;  . Back surgery    . Transcatheter aortic valve replacement, transfemoral N/A 04/03/2014    Procedure: TRANSCATHETER AORTIC VALVE REPLACEMENT, TRANSFEMORAL;  Surgeon: Purcell Nails, MD;  Location: [facility] OR;  Service: Open Heart Surgery;  Laterality: N/A;  . Intraoperative transesophageal echocardiogram N/A 04/03/2014    Procedure: INTRAOPERATIVE TRANSESOPHAGEAL ECHOCARDIOGRAM;  Surgeon: Purcell Nails, MD;  Location: [facility] OR;  Service: Open Heart Surgery;  Laterality: N/A;  . Left and right heart catheterization with coronary/graft angiogram N/A 02/05/2014    Procedure: LEFT AND RIGHT HEART CATHETERIZATION WITH Isabel Caprice;  Surgeon: Peter M Swaziland, MD;  Location: [facility] CATH LAB;  Service: Cardiovascular;  Laterality: N/A;  . Posterior laminectomy / decompression cervical spine  05/2007  . Ep implantable device N/A 12/27/2014    Procedure: Pacemaker Implant;  Surgeon: Marinus Maw, MD;  Location: [facility] INVASIVE CV LAB;  Service: Cardiovascular;  Laterality: N/A;    Current  Outpatient Prescriptions  Medication Sig Dispense Refill  . albuterol-ipratropium (COMBIVENT) 18-103 MCG/ACT inhaler Inhale 2 puffs into the lungs every 6 (six) hours as needed for wheezing or shortness of breath.     . Artificial Tear Ointment (DRY EYES OP) Apply 1 drop to eye daily as needed (dry eyes).    Marland Kitchen ascorbic acid (VITAMIN C) 500 MG tablet Take 500 mg by mouth daily.     Marland Kitchen aspirin 81 MG tablet Take 1 tablet (81 mg total) by mouth daily.    . budesonide (PULMICORT) 0.25 MG/2ML nebulizer solution Take 0.25 mg by nebulization 2 (two) times daily.    . cetirizine (ZYRTEC) 10 MG tablet Take 10 mg by mouth at bedtime.    . clopidogrel (PLAVIX) 75 MG tablet Take 1 tablet (75  mg total) by mouth daily. 90 tablet 3  . co-enzyme Q-10 30 MG capsule Take 30 mg by mouth at bedtime.     . CRESTOR 5 MG tablet Take 5 mg by mouth every other day. 3 day weekly    . desonide (DESOWEN) 0.05 % cream Apply 1 application topically 2 (two) times daily as needed (for rash).     Marland Kitchen diltiazem (CARDIZEM CD) 120 MG 24 hr capsule Take 1 capsule (120 mg total) by mouth daily. 90 capsule 3  . ezetimibe (ZETIA) 10 MG tablet Take 10 mg by mouth at bedtime.     . flunisolide (NASAREL) 29 MCG/ACT (0.025%) nasal spray Place 2 sprays into the nose at bedtime as needed for rhinitis (for congestion). Dose is for each nostril.    . insulin NPH-regular Human (NOVOLIN 70/30) (70-30) 100 UNIT/ML injection Inject 15 Units into the skin 2 (two) times daily. (Patient taking differently: Inject 30-40 Units into the skin 2 (two) times daily. 40 as a base; <180 = 40 units) 10 mL 11  . ipratropium-albuterol (DUONEB) 0.5-2.5 (3) MG/3ML SOLN Take 3 mLs by nebulization every 6 (six) hours as needed (for shortness of breath).    Marland Kitchen ketoconazole (NIZORAL) 2 % cream Apply 1 application topically 2 (two) times daily as needed for irritation.     Marland Kitchen KRISTALOSE 20 G packet Take 20 g by mouth 2 (two) times daily.    Marland Kitchen loratadine (CLARITIN) 10 MG  tablet Take 10 mg by mouth every morning.     . metoprolol succinate (TOPROL-XL) 25 MG 24 hr tablet Take 1 tablet (25 mg total) by mouth daily.    . Multiple Vitamin (MULTIVITAMIN) tablet Take 1 tablet by mouth daily.      Marland Kitchen oxyCODONE-acetaminophen (PERCOCET/ROXICET) 5-325 MG per tablet Take 1-2 tablets by mouth every 4 (four) hours as needed for moderate pain. (Patient taking differently: Take 1 tablet by mouth every 4 (four) hours as needed for moderate pain. ) 40 tablet 0  . potassium chloride SA (K-DUR,KLOR-CON) 20 MEQ tablet Take 20 mEq by mouth 2 (two) times daily.    . predniSONE (DELTASONE) 5 MG tablet Take 5 mg by mouth daily with breakfast.    . rifaximin (XIFAXAN) 550 MG TABS tablet Take 550 mg by mouth 2 (two) times daily.    Marland Kitchen torsemide (DEMADEX) 20 MG tablet Take 20 mg by mouth 2 (two) times daily.     No current facility-administered medications for this visit.    Allergies:   Review of patient's allergies indicates no known allergies.   Social History:  The patient  reports that he quit smoking about 18 years ago. His smoking use included Cigarettes. He started smoking about 53 years ago. He has a 105 pack-year smoking history. His smokeless tobacco use includes Chew. He reports that he does not drink alcohol or use illicit drugs.   Family History:  The patient's  family history includes Asthma in his child; Cancer in his father; Emphysema in his brother; Hypertension in his mother; Liver cancer in his paternal grandfather; Rheum arthritis in his maternal aunt; Stroke in his brother.    ROS:  Please see the history of present illness.  Otherwise, review of systems is positive for fatigue, leg swelling, hearing loss, abdominal pain, nausea/vomiting, back pain, muscle pain, joint swelling, balance problems.  All other systems are reviewed and negative.    PHYSICAL EXAM: VS:  BP 114/58 mmHg  Pulse 70  Ht 5\' 11"  (1.803 m)  Wt 228 lb (103.42 kg)  BMI 31.81 kg/m2 , BMI Body mass  index is 31.81 kg/(m^2). GEN: Well nourished, well developed, in no acute distress HEENT: normal Neck: no JVD, no masses. No carotid bruits. Cardiac: RRR without gallop or murmur        Respiratory:  clear to auscultation bilaterally, normal work of breathing GI: soft, nontender, nondistended, + BS MS: no deformity or atrophy Ext: 1+ B/L pretibial edema Skin: warm and dry, no rash Neuro:  Grossly intact Psych: euthymic mood, full affect  EKG:  EKG is not ordered today.  Recent Labs: 10/28/2014: B Natriuretic Peptide 76.0 12/26/2014: ALT 38; Magnesium 1.9; TSH 1.836 12/30/2014: Hemoglobin 11.9*; Platelets 76* 12/31/2014: BUN 44*; Creatinine, Ser 2.12*; Potassium 3.8; Sodium 133*   Lipid Panel  No results found for: CHOL, TRIG, HDL, CHOLHDL, VLDL, LDLCALC, LDLDIRECT    Wt Readings from Last 3 Encounters:  04/14/15 228 lb (103.42 kg)  03/31/15 225 lb 9.6 oz (102.331 kg)  02/10/15 226 lb (102.513 kg)     Cardiac Studies Reviewed: 04/14/15 2D ECHO  ASSESSMENT AND PLAN: 1.  Severe aortic stenosis s/p TAVR - 1 year s/p TAVR.  Overall doing okay but has had a lot of hospitalizations over the summer for other medical issues and had PPM placed three months ago.  Valve gradients ok on ECHO and I am unable to even appreciate an AS murmur on exam today.  Will bring him back in 1 year for follow-up and repeat 2D ECHO.  2. Chronic diastolic HF - stable. NYHF class 3.  On torsemide, Toprol.  Managed by Dr. Abigail Butts.  3. CAD s/p CABG (1990s) - asymptomatic.  Followed by Dr. Purvis Sheffield.  conitnue antiplatelet, Zetia (intolerant to statin), Toprol.  4. PAF - in NSR.  Has PPM for symptomatic bradycardia.  Managed by Dr. Purvis Sheffield.  No A/C due to portal HTN.  Continue DAPT with ASA/Plavix.  5. HTN - stable  Current medicines are reviewed with the patient today.  The patient does not have concerns regarding medicines.  Labs/ tests ordered today include: none  Orders Placed This Encounter    Procedures  . Echocardiogram   Disposition:   FU 1 year for 2D ECHO.  The patient is seen today with Dr Andrey Campanile. I have amended her documentation where appropriate. I have personally reviewed his echo images which demonstrate normal THV function with a mean gradient of 11 mmHg and no AI. Will see him back in one year for follow-up. Otherwise as outlined above.   Enzo Bi, MD  04/14/2015 4:49 PM    [facility] [address] [place], [place], [place]  [address] Phone: [phone]; Fax: [phone]

## 2015-04-26 ENCOUNTER — Other Ambulatory Visit: Payer: Self-pay | Admitting: Cardiovascular Disease

## 2015-05-01 ENCOUNTER — Other Ambulatory Visit: Payer: Self-pay | Admitting: *Deleted

## 2015-05-01 MED ORDER — POTASSIUM CHLORIDE CRYS ER 20 MEQ PO TBCR: 20.0000 meq | EXTENDED_RELEASE_TABLET | Freq: Two times a day (BID) | ORAL | Status: AC

## 2015-05-09 ENCOUNTER — Other Ambulatory Visit: Payer: Self-pay | Admitting: Cardiovascular Disease

## 2015-06-03 ENCOUNTER — Encounter: Payer: Self-pay | Admitting: *Deleted

## 2015-06-03 ENCOUNTER — Other Ambulatory Visit: Payer: Self-pay | Admitting: *Deleted

## 2015-06-03 NOTE — Patient Outreach (Signed)
06/03/15- Telephone call to patient for screening (referral received from [employer] list), spoke with pt and permission given to speak with wife as pt is hard of hearing, spoke with wife Clydie Braun who reports "we're doing okay right now", states pt has multiple chronic health conditions and states "we know how to manage" pt does weigh daily, checks CBG daily, reading today 150, has all needed equipment in the home, has all medications and can afford, pt is on cymbalta for chronic pain issues and sees primary doctor regularly, pt and wife has refused [facility] program in the past and are refusing again today, wife does not want information mailed as she says they received this information several months ago.  RN CM faxed letter to primary care MD Dr. Donzetta Sprung informing pt refused services, sent successful outreach letter to patient.  [name], BSN [facility] Community Care Coordinator [phone]

## 2015-06-05 ENCOUNTER — Encounter: Payer: Self-pay | Admitting: *Deleted

## 2015-06-17 ENCOUNTER — Encounter: Payer: Self-pay | Admitting: Cardiovascular Disease

## 2015-06-17 ENCOUNTER — Ambulatory Visit (INDEPENDENT_AMBULATORY_CARE_PROVIDER_SITE_OTHER): Payer: Medicare Other | Admitting: Cardiovascular Disease

## 2015-06-17 VITALS — BP 118/60 | HR 65 | Ht 71.0 in | Wt 216.0 lb

## 2015-06-17 DIAGNOSIS — E785 Hyperlipidemia, unspecified: Secondary | ICD-10-CM

## 2015-06-17 DIAGNOSIS — Z954 Presence of other heart-valve replacement: Secondary | ICD-10-CM | POA: Diagnosis not present

## 2015-06-17 DIAGNOSIS — Z952 Presence of prosthetic heart valve: Secondary | ICD-10-CM

## 2015-06-17 DIAGNOSIS — I10 Essential (primary) hypertension: Secondary | ICD-10-CM | POA: Diagnosis not present

## 2015-06-17 DIAGNOSIS — I5032 Chronic diastolic (congestive) heart failure: Secondary | ICD-10-CM

## 2015-06-17 DIAGNOSIS — K769 Liver disease, unspecified: Secondary | ICD-10-CM

## 2015-06-17 DIAGNOSIS — I25812 Atherosclerosis of bypass graft of coronary artery of transplanted heart without angina pectoris: Secondary | ICD-10-CM

## 2015-06-17 DIAGNOSIS — Z950 Presence of cardiac pacemaker: Secondary | ICD-10-CM

## 2015-06-17 DIAGNOSIS — I482 Chronic atrial fibrillation, unspecified: Secondary | ICD-10-CM

## 2015-06-17 DIAGNOSIS — R630 Anorexia: Secondary | ICD-10-CM

## 2015-06-17 NOTE — Progress Notes (Signed)
Patient ID: [name], male   DOB: November 05, 1945, 70 y.o.   MRN: [id number]      SUBJECTIVE: The patient presents for routine cardiovascular follow-up. He has a history of chronic diastolic heart failure, atrial fibrillation and flutter, CABG, and TAVR.  He was hospitalized in August 2016 for profound bradycardia and underwent implantation of a single-chamber pacemaker by Dr. Ladona Ridgel on 12/27/14.  Echocardiogram on 04/14/15 demonstrated normal left ventricular systolic function and regional wall motion , LVEF 60-65%, mild LVH, grade 1 diastolic dysfunction , no central aortic regurgitation and no paravalvular leak with normal transaortic gradients (mean gradient 12 mmHg) , and mild left atrial dilatation.  Denies chest pain. Says "breathing is ok". No leg swelling.  Main complaint relates to 100 lb weight loss in past year and lack of appetite.   Review of Systems: As per "subjective", otherwise negative.  Allergies  Allergen Reactions  . Xarelto [Rivaroxaban]     Caused bleeding from unknown source     Current Outpatient Prescriptions  Medication Sig Dispense Refill  . albuterol-ipratropium (COMBIVENT) 18-103 MCG/ACT inhaler Inhale 2 puffs into the lungs every 6 (six) hours as needed for wheezing or shortness of breath.     . Artificial Tear Ointment (DRY EYES OP) Apply 1 drop to eye daily as needed (dry eyes).    Marland Kitchen ascorbic acid (VITAMIN C) 500 MG tablet Take 500 mg by mouth daily.     Marland Kitchen aspirin 81 MG tablet Take 1 tablet (81 mg total) by mouth daily.    . cetirizine (ZYRTEC) 10 MG tablet Take 10 mg by mouth at bedtime.    . Citalopram Hydrobromide (CELEXA PO) Take by mouth daily.    . clopidogrel (PLAVIX) 75 MG tablet Take 1 tablet (75 mg total) by mouth daily. 90 tablet 3  . co-enzyme Q-10 30 MG capsule Take 30 mg by mouth at bedtime.     . CRESTOR 5 MG tablet Take 5 mg by mouth every other day. 3 day weekly    . desonide (DESOWEN) 0.05 % cream Apply 1 application topically 2  (two) times daily as needed (for rash).     Marland Kitchen diltiazem (CARDIZEM CD) 120 MG 24 hr capsule Take 1 capsule (120 mg total) by mouth daily. 90 capsule 3  . ezetimibe (ZETIA) 10 MG tablet Take 10 mg by mouth at bedtime.     . flunisolide (NASAREL) 29 MCG/ACT (0.025%) nasal spray Place 2 sprays into the nose at bedtime as needed for rhinitis (for congestion). Dose is for each nostril.    . insulin NPH-regular Human (NOVOLIN 70/30) (70-30) 100 UNIT/ML injection Inject 15 Units into the skin 2 (two) times daily. (Patient taking differently: Inject 15-20 Units into the skin 2 (two) times daily. 40 as a base; <180 = 40 units) 10 mL 11  . ipratropium-albuterol (DUONEB) 0.5-2.5 (3) MG/3ML SOLN Take 3 mLs by nebulization every 6 (six) hours as needed (for shortness of breath).    Marland Kitchen ketoconazole (NIZORAL) 2 % cream Apply 1 application topically 2 (two) times daily as needed for irritation.     Marland Kitchen KRISTALOSE 20 G packet Take 20 g by mouth 2 (two) times daily.    Marland Kitchen loratadine (CLARITIN) 10 MG tablet Take 10 mg by mouth every morning.     . metoprolol succinate (TOPROL-XL) 25 MG 24 hr tablet Take 1 tablet (25 mg total) by mouth daily.    . Multiple Vitamin (MULTIVITAMIN) tablet Take 1 tablet by mouth daily.      Marland Kitchen  oxyCODONE-acetaminophen (PERCOCET/ROXICET) 5-325 MG per tablet Take 1-2 tablets by mouth every 4 (four) hours as needed for moderate pain. (Patient taking differently: Take 1 tablet by mouth every 4 (four) hours as needed for moderate pain. ) 40 tablet 0  . potassium chloride SA (K-DUR,KLOR-CON) 20 MEQ tablet Take 1 tablet (20 mEq total) by mouth 2 (two) times daily. 180 tablet 3  . predniSONE (DELTASONE) 5 MG tablet Take 5 mg by mouth daily with breakfast.    . rifaximin (XIFAXAN) 550 MG TABS tablet Take 550 mg by mouth 2 (two) times daily.    Marland Kitchen torsemide (DEMADEX) 20 MG tablet Take 20 mg by mouth 2 (two) times daily.     No current facility-administered medications for this visit.    Past Medical  History  Diagnosis Date  . COPD (chronic obstructive pulmonary disease) (HCC)   . Elevated liver function tests   . Hypertension   . Aortic stenosis     a. 2D ECHO: 01/30/2014: ef 50-55%. No RWMAs. Severe NF:AOZHY area (VTI): 0.82 cm^2. Valve area (Vmax): 0.74 cm^2. Valve area (Vmean): 0.75 Cm^2. Mod LA/RA dilation. PA peak pressure 33  . CAD (coronary artery disease)   . GERD (gastroesophageal reflux disease)   . Chronic atrial flutter (HCC)   . 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 Morbid obesity (HCC)   . Coronary atherosclerosis of artery bypass graft 2010  . Acute on chronic diastolic heart failure (HCC) 02/01/2014  . Chronic diastolic congestive heart failure (HCC) 02/01/2014  . Epistaxis     Developed on Xarelto therapy  . Cirrhosis (HCC) 02/01/2014  . Portal hypertension (HCC) 02/01/2014  . Chronic pancreatitis (HCC) 02/01/2014    Noted on CT scan  . Shortness of breath   . Dizziness   . Heart murmur   . Asthma     uses nebulizers daily  . Dysrhythmia     fib/flutter  . Anemia     was on xalerto sept 2015 for afib - was taken off of - had to get unit of prbc  . History of blood transfusion 01/2014    "right after starting Xeralto"  . 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 Complication of anesthesia     "has a hard time breathing when he wakes up"  . Hyperlipidemia   . Pneumonia "several times:  . Type II diabetes mellitus (HCC)   . Rheumatoid arthritis(714.0)     "everywhere"  . Degenerative disc disease, cervical   . Degenerative disc disease, lumbar 2010  . Osteoarthritis     "everywhere"  . Chronic back pain     "lower back usually; upper back fior the last couple weeks"  (12/26/2014)  . ARF (acute renal failure) (HCC) 11/2014  . On home oxygen therapy      "2.5L at night & prn during the day" (12/26/2014)  . Other myositis, left thigh dx'd 12/26/2014  . Acute renal failure (HCC) 12/30/2014  . Atrial fibrillation (HCC) 12/30/2014  . 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 Hyposmolality and/or hyponatremia 12/30/2014  . CHB (complete heart block) (HCC) 12/30/2014  . Elevated troponin level, secondary to CHB acute renal failure 12/30/2014    Past Surgical History  Procedure Laterality  Date  . Coronary angioplasty with stent placement  01/02/2009    s/p bare metal stent to the vein graft to the RCA 9/20. nml LV function   . Appendectomy  1950  . Coronary artery bypass graft  03/27/1997    CABG x 5 by Dr Cornelius Moras  . Anterior cervical decomp/discectomy fusion  12/2006  . Tonsillectomy and adenoidectomy  1950  . Lumbar disc surgery  2011    "put plastic wedge in"  . Esophagogastroduodenoscopy (egd) with propofol N/A 02/01/2014    Procedure: ESOPHAGOGASTRODUODENOSCOPY (EGD) WITH PROPOFOL;  Surgeon: Shirley Friar, MD;  Location: [facility] ENDOSCOPY;  Service: Endoscopy;  Laterality: N/A;  . Colonoscopy with propofol N/A 02/01/2014    Procedure: COLONOSCOPY WITH PROPOFOL;  Surgeon: Shirley Friar, MD;  Location: [facility] ENDOSCOPY;  Service: Endoscopy;  Laterality: N/A;  . Multiple extractions with alveoloplasty N/A 03/12/2014    Procedure: MULTIPLE EXTRACTION OF TOOTH #'S 2,6,7,8,9,10,11,14,18,20,29,31 WITH ALVEOLOPLASTY AND GROSS DEBRIDMENT OF REMAINING TEETH;  Surgeon: Charlynne Pander, DDS;  Location: [facility] OR;  Service: Oral Surgery;  Laterality: N/A;  . Back surgery    . Transcatheter aortic valve replacement, transfemoral N/A 04/03/2014    Procedure: TRANSCATHETER AORTIC VALVE REPLACEMENT, TRANSFEMORAL;  Surgeon: Purcell Nails, MD;  Location: [facility] OR;  Service: Open Heart Surgery;  Laterality: N/A;  . Intraoperative transesophageal echocardiogram N/A 04/03/2014    Procedure: INTRAOPERATIVE TRANSESOPHAGEAL ECHOCARDIOGRAM;   Surgeon: Purcell Nails, MD;  Location: [facility] OR;  Service: Open Heart Surgery;  Laterality: N/A;  . Left and right heart catheterization with coronary/graft angiogram N/A 02/05/2014    Procedure: LEFT AND RIGHT HEART CATHETERIZATION WITH Isabel Caprice;  Surgeon: Peter M Swaziland, MD;  Location: [facility] CATH LAB;  Service: Cardiovascular;  Laterality: N/A;  . Posterior laminectomy / decompression cervical spine  05/2007  . Ep implantable device N/A 12/27/2014    Procedure: Pacemaker Implant;  Surgeon: Marinus Maw, MD;  Location: [facility] INVASIVE CV LAB;  Service: Cardiovascular;  Laterality: N/A;    Social History   Social History  . Marital Status: Married    Spouse Name: N/A  . Number of Children: N/A  . Years of Education: N/A   Occupational History  . Retired Special educational needs teacher    Social History Main Topics  . Smoking status: Former Smoker -- 3.00 packs/day for 35 years    Types: Cigarettes    Start date: 09/15/1961    Quit date: 11/23/1996  . Smokeless tobacco: Current User    Types: Chew  . Alcohol Use: No  . Drug Use: No  . Sexual Activity: Yes   Other Topics Concern  . Not on file   Social History Narrative   Married, retired.      Filed Vitals:   06/17/15 1501  BP: 118/60  Pulse: 65  Height: 5\' 11"  (1.803 m)  Weight: 216 lb (97.977 kg)  SpO2: 93%    PHYSICAL EXAM General: NAD HEENT: Normal. Neck: No JVD, no thyromegaly. Lungs: Faint dry crackles at bases with normal respiratory effort. CV: Regular rate and rhythm, normal S1/S2, no S3, no murmur. No pretibial and periankle edema.  Abdomen: Soft, non tender, no distention.  Neurologic: Alert and oriented.  Psych: Normal affect. Skin: Normal. Musculoskeletal: No gross deformities. Extremities: No clubbing or cyanosis.   ECG: Most recent ECG reviewed.      ASSESSMENT AND PLAN: 1. CAD: No anginal discomfort. Has SVG stenosis, for which medical therapy was recommended. Will continue Plavix, ASA, Zetia,  Crestor, and  metoprolol.  2. Essential HTN: Controlled. No changes.  3. Hyperlipidemia: On Zetia and Crestor. Followed by PCP. Intolerant to Lipitor.   4. Paroxysmal atrial flutter/atrial fibrillation: Now paced. Symptomatically stable. On long-acting diltiazem and metoprolol. He is a poor candidate for anticoagulation given portal hypertension with esophageal varices. Will continue ASA and Plavix for now.   5. Severe aortic stenosis s/p TAVR: Echocardiogram in 04/2015 showed a normally functioning prosthetic aortic valve and normal LV systolic function.   6. COPD: Followed by pulmonary. Appears to be stable.   7. Chronic diastolic heart failure: Euvolemic. Continue torsemide 20 mg bid.  8. Symptomatic bradycardia s/p pacemaker: Stable. Follows with Dr. Ladona Ridgel.  9. Hepatocellular disease, weight loss, loss of appetite: Will make GI referral.  Dispo: f/u 6 months.   Prentice Docker, M.D., F.A.C.C.

## 2015-06-17 NOTE — Patient Instructions (Signed)
Continue all current medications. Referral to GI  Your physician wants you to follow up in: 6 months.  You will receive a reminder letter in the mail one-two months in advance.  If you don't receive a letter, please call our office to schedule the follow up appointment

## 2015-06-23 ENCOUNTER — Encounter: Payer: Self-pay | Admitting: Gastroenterology

## 2015-06-30 ENCOUNTER — Ambulatory Visit (INDEPENDENT_AMBULATORY_CARE_PROVIDER_SITE_OTHER): Payer: Medicare Other | Admitting: *Deleted

## 2015-06-30 ENCOUNTER — Telehealth: Payer: Self-pay | Admitting: Cardiology

## 2015-06-30 DIAGNOSIS — I442 Atrioventricular block, complete: Secondary | ICD-10-CM | POA: Diagnosis not present

## 2015-06-30 DIAGNOSIS — Z950 Presence of cardiac pacemaker: Secondary | ICD-10-CM

## 2015-06-30 NOTE — Progress Notes (Signed)
Remote pacemaker transmission.   

## 2015-06-30 NOTE — Telephone Encounter (Signed)
Confirmed remote transmission w/ pt wife.   

## 2015-07-09 ENCOUNTER — Encounter: Payer: Self-pay | Admitting: Gastroenterology

## 2015-07-09 ENCOUNTER — Other Ambulatory Visit: Payer: Self-pay

## 2015-07-09 ENCOUNTER — Ambulatory Visit (INDEPENDENT_AMBULATORY_CARE_PROVIDER_SITE_OTHER): Payer: Medicare Other | Admitting: Gastroenterology

## 2015-07-09 ENCOUNTER — Telehealth: Payer: Self-pay | Admitting: Gastroenterology

## 2015-07-09 VITALS — BP 117/65 | HR 77 | Temp 97.3°F | Ht 71.0 in | Wt 218.4 lb

## 2015-07-09 DIAGNOSIS — R634 Abnormal weight loss: Secondary | ICD-10-CM

## 2015-07-09 DIAGNOSIS — I25812 Atherosclerosis of bypass graft of coronary artery of transplanted heart without angina pectoris: Secondary | ICD-10-CM | POA: Diagnosis not present

## 2015-07-09 DIAGNOSIS — K7460 Unspecified cirrhosis of liver: Secondary | ICD-10-CM | POA: Diagnosis not present

## 2015-07-09 LAB — COMPLETE METABOLIC PANEL WITH GFR
ALT: 60 U/L — ABNORMAL HIGH (ref 9–46)
AST: 59 U/L — ABNORMAL HIGH (ref 10–35)
Albumin: 2.9 g/dL — ABNORMAL LOW (ref 3.6–5.1)
Alkaline Phosphatase: 177 U/L — ABNORMAL HIGH (ref 40–115)
BILIRUBIN TOTAL: 0.9 mg/dL (ref 0.2–1.2)
BUN: 20 mg/dL (ref 7–25)
CHLORIDE: 99 mmol/L (ref 98–110)
CO2: 29 mmol/L (ref 20–31)
Calcium: 9.3 mg/dL (ref 8.6–10.3)
Creat: 1.17 mg/dL (ref 0.70–1.18)
GFR, EST AFRICAN AMERICAN: 73 mL/min (ref >=60)
GFR, EST NON AFRICAN AMERICAN: 63 mL/min (ref >=60)
GLUCOSE: 164 mg/dL — AB (ref 65–99)
POTASSIUM: 4.3 mmol/L (ref 3.5–5.3)
SODIUM: 138 mmol/L (ref 135–146)
Total Protein: 6.5 g/dL (ref 6.1–8.1)

## 2015-07-09 LAB — IRON AND TIBC
%SAT: 14 % — AB (ref 15–60)
IRON: 45 ug/dL — AB (ref 50–180)
TIBC: 311 ug/dL (ref 250–425)
UIBC: 266 ug/dL (ref 125–400)

## 2015-07-09 LAB — FERRITIN: FERRITIN: 53 ng/mL (ref 20–380)

## 2015-07-09 NOTE — Assessment & Plan Note (Addendum)
70 year old male with newly diagnosed cirrhosis in 2015, needing to establish care. Unclear etiology at this point; he denies any history of ETOH abuse or current ETOH use. Question possibly related to NASH, unable to rule out other etiology such as autoimmune/PBC. Needs up-to-date imaging for [facility] screening. He has experienced 100 lb weight loss over the past year, notes vague decreased appetite, nausea, and imaging from 2015 raises concern for atherosclerotic disease and could very well have a chronic mesenteric ischemia presentation. Needs EGD for variceal surveillance, as last was in 2015 and he had small varices; he has had decompensation in the past with encephalopathy but appears to be doing well at baseline currently. Lactulose dosing needs to be titrated, as it appears just 20 grams per day is causing significant diarrhea, abdominal bloating and discomfort.  1. Extensive labs ordered today 2. CT angiogram to assess vasculature and this will serve to evaluate liver as well 3. Decrease lactulose dosing with goal of 2-3 soft BMs per day 4. EGD (with possible dilation due to pill dysphagia) with Dr. Darrick Penna for variceal surveillance with PROPOFOL due to polypharmacy. Risks and benefits discussed in detail. HAS PACEMAKER 5. Colonoscopy up-to-date; would probably hold off on any further evaluation in the future unless clinically indicated due to multiple comorbidities 6. Follow every 6 months. Next follow-up may be sooner depending on labs and as database expands.

## 2015-07-09 NOTE — Patient Instructions (Signed)
Please have blood work done today.   I have also ordered a CT scan that will serve as imaging for your liver but also to look at the blood vessels in your stomach and make sure these are "open" enough to provide adequate blood supply.  We have scheduled you for an upper endoscopy with Dr. Darrick Penna in the near future. She may dilate your esophagus if there is any narrowing.  Please take 1/2 dosage of the lactulose daily. Call if still having diarrhea.

## 2015-07-09 NOTE — Telephone Encounter (Signed)
Please call patient wife [phone]   She needs to reschedule what you just scheduled

## 2015-07-09 NOTE — Progress Notes (Signed)
cc'ed to pcp °

## 2015-07-09 NOTE — Progress Notes (Signed)
Primary Care Physician:  Donzetta Sprung, MD Primary Gastroenterologist:  Dr. Darrick Penna   Chief Complaint  Patient presents with  . Liver Disease    HPI:   [name] is a 70 y.o. male presenting today at the request of Dr. Purvis Sheffield due to cirrhosis. He was diagnosed in 2015 while inpatient. At that time, he had a colonoscopy by Dr. Bosie Clos  without polyps. EGD in Oct 2015 with small distal esophageal varices and mild portal gastropathy. He is overdue for [facility] screening. Last abdominal imaging was a CTA in Oct 2015, noting nodular liver, possible sequela of chronic pancreatitis, patent IMA, appears to be stenosis in excess of 50% at ostium of SMA, and prominent plaques at celiac axis. States they found out about cirrhosis in 2015 while hospitalized for occult GI bleeding. Wife is concerned about ammonia levels. Denies any confusion now. On lactulose and Xifaxan. Lactulose causes churning, watery diarrhea. Lost his appetite. In the last 2-3 weeks, he has had more of an appetite and eating cherry pies. No pain with eating. Abdominal cramping with lactulose. Notes loss of appetite. Occasional nausea, no vomiting. Had lost down to 205 lbs, weighed 306 last summer. Today 218. No lower extremity edema. Denies any history of alcohol abuse or current alcohol use. Was exposed to a lot of chemicals in the [employer]. +pill dysphagia. No solid food dysphagia.     Past Medical History  Diagnosis Date  . COPD (chronic obstructive pulmonary disease) (HCC)   . Elevated liver function tests   . Hypertension   . Aortic stenosis     a. 2D ECHO: 01/30/2014: ef 50-55%. No RWMAs. Severe HW:EXHBZ area (VTI): 0.82 cm^2. Valve area (Vmax): 0.74 cm^2. Valve area (Vmean): 0.75 Cm^2. Mod LA/RA dilation. PA peak pressure 33  . CAD (coronary artery disease)   . GERD (gastroesophageal reflux disease)   . Chronic atrial flutter (HCC)   . 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 Morbid obesity (HCC)   . Coronary atherosclerosis of artery bypass graft 2010  . Acute on chronic diastolic heart failure (HCC) 02/01/2014  . Chronic diastolic congestive heart failure (HCC) 02/01/2014  . Epistaxis     Developed on Xarelto therapy  . Cirrhosis (HCC) 02/01/2014  . Portal hypertension (HCC) 02/01/2014  . Chronic pancreatitis (HCC) 02/01/2014    Noted on CT scan  . Shortness of breath   . Dizziness   . Heart murmur   . Asthma     uses nebulizers daily  . Dysrhythmia     fib/flutter  . Anemia     was on xalerto sept 2015 for afib - was taken off of - had to get unit of prbc  . History of blood transfusion 01/2014    "right after starting Xeralto"  . 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 Complication of anesthesia     "has a hard time breathing when he wakes up"  . Hyperlipidemia   . Pneumonia "several times:  . Type II diabetes mellitus (HCC)   . Rheumatoid arthritis(714.0)     "everywhere"  . Degenerative disc disease, cervical   . Degenerative disc disease, lumbar 2010  . Osteoarthritis     "everywhere"  . Chronic back  pain     "lower back usually; upper back fior the last couple weeks"  (12/26/2014)  . ARF (acute renal failure) (HCC) 11/2014  . On home oxygen therapy     "2.5L at night & prn during the day" (12/26/2014)  . Other myositis, left thigh dx'd 12/26/2014  . Acute renal failure (HCC) 12/30/2014  . Atrial fibrillation (HCC) 12/30/2014  . 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 Hyposmolality and/or hyponatremia 12/30/2014  . CHB (complete heart block) (HCC) 12/30/2014  . Elevated troponin level, secondary to CHB acute renal failure 12/30/2014    Past Surgical History  Procedure Laterality Date  . Coronary angioplasty with stent placement  01/02/2009     s/p bare metal stent to the vein graft to the RCA 9/20. nml LV function   . Appendectomy  1950  . Coronary artery bypass graft  03/27/1997    CABG x 5 by Dr Cornelius Moras  . Anterior cervical decomp/discectomy fusion  12/2006  . Tonsillectomy and adenoidectomy  1950  . Lumbar disc surgery  2011    "put plastic wedge in"  . Esophagogastroduodenoscopy (egd) with propofol N/A 02/01/2014    Dr. Bosie Clos: small distal esophageal varices and mild portal gastropathy  . Colonoscopy with propofol N/A 02/01/2014    Sigmoid diverticulosis   . Multiple extractions with alveoloplasty N/A 03/12/2014    Procedure: MULTIPLE EXTRACTION OF TOOTH #'S 2,6,7,8,9,10,11,14,18,20,29,31 WITH ALVEOLOPLASTY AND GROSS DEBRIDMENT OF REMAINING TEETH;  Surgeon: Charlynne Pander, DDS;  Location: [facility] OR;  Service: Oral Surgery;  Laterality: N/A;  . Back surgery    . Transcatheter aortic valve replacement, transfemoral N/A 04/03/2014    Procedure: TRANSCATHETER AORTIC VALVE REPLACEMENT, TRANSFEMORAL;  Surgeon: Purcell Nails, MD;  Location: [facility] OR;  Service: Open Heart Surgery;  Laterality: N/A;  . Intraoperative transesophageal echocardiogram N/A 04/03/2014    Procedure: INTRAOPERATIVE TRANSESOPHAGEAL ECHOCARDIOGRAM;  Surgeon: Purcell Nails, MD;  Location: [facility] OR;  Service: Open Heart Surgery;  Laterality: N/A;  . Left and right heart catheterization with coronary/graft angiogram N/A 02/05/2014    Procedure: LEFT AND RIGHT HEART CATHETERIZATION WITH Isabel Caprice;  Surgeon: Peter M Swaziland, MD;  Location: [facility] CATH LAB;  Service: Cardiovascular;  Laterality: N/A;  . Posterior laminectomy / decompression cervical spine  05/2007  . Ep implantable device N/A 12/27/2014    Procedure: Pacemaker Implant;  Surgeon: Marinus Maw, MD;  Location: [facility] INVASIVE CV LAB;  Service: Cardiovascular;  Laterality: N/A;    Current Outpatient Prescriptions  Medication Sig Dispense Refill  . albuterol-ipratropium (COMBIVENT) 18-103 MCG/ACT  inhaler Inhale 2 puffs into the lungs every 6 (six) hours as needed for wheezing or shortness of breath.     . Artificial Tear Ointment (DRY EYES OP) Apply 1 drop to eye daily as needed (dry eyes).    Marland Kitchen ascorbic acid (VITAMIN C) 500 MG tablet Take 500 mg by mouth daily.     Marland Kitchen aspirin 81 MG tablet Take 1 tablet (81 mg total) by mouth daily.    . cetirizine (ZYRTEC) 10 MG tablet Take 10 mg by mouth at bedtime.    . Citalopram Hydrobromide (CELEXA PO) Take by mouth daily.    . clopidogrel (PLAVIX) 75 MG tablet Take 1 tablet (75 mg total) by mouth daily. 90 tablet 3  . co-enzyme Q-10 30 MG capsule Take 30 mg by mouth at bedtime.     . CRESTOR 5 MG tablet Take 5 mg by mouth every other day. 3  day weekly    . desonide (DESOWEN) 0.05 % cream Apply 1 application topically 2 (two) times daily as needed (for rash).     Marland Kitchen diltiazem (CARDIZEM CD) 120 MG 24 hr capsule Take 1 capsule (120 mg total) by mouth daily. 90 capsule 3  . DULoxetine HCl (CYMBALTA PO) Take 1 tablet by mouth daily.    Marland Kitchen ezetimibe (ZETIA) 10 MG tablet Take 10 mg by mouth at bedtime.     . flunisolide (NASAREL) 29 MCG/ACT (0.025%) nasal spray Place 2 sprays into the nose at bedtime as needed for rhinitis (for congestion). Dose is for each nostril.    . insulin NPH-regular Human (NOVOLIN 70/30) (70-30) 100 UNIT/ML injection Inject 15 Units into the skin 2 (two) times daily. (Patient taking differently: Inject 15-20 Units into the skin 2 (two) times daily. 40 as a base; <180 = 40 units) 10 mL 11  . ipratropium-albuterol (DUONEB) 0.5-2.5 (3) MG/3ML SOLN Take 3 mLs by nebulization every 6 (six) hours as needed (for shortness of breath).    Marland Kitchen ketoconazole (NIZORAL) 2 % cream Apply 1 application topically 2 (two) times daily as needed for irritation.     Marland Kitchen KRISTALOSE 20 G packet Take 20 g by mouth 2 (two) times daily.    Marland Kitchen loratadine (CLARITIN) 10 MG tablet Take 10 mg by mouth every morning.     . metoprolol succinate (TOPROL-XL) 25 MG 24 hr  tablet Take 1 tablet (25 mg total) by mouth daily.    . Multiple Vitamin (MULTIVITAMIN) tablet Take 1 tablet by mouth daily.      Marland Kitchen oxyCODONE-acetaminophen (PERCOCET/ROXICET) 5-325 MG per tablet Take 1-2 tablets by mouth every 4 (four) hours as needed for moderate pain. (Patient taking differently: Take 1 tablet by mouth every 4 (four) hours as needed for moderate pain. ) 40 tablet 0  . potassium chloride SA (K-DUR,KLOR-CON) 20 MEQ tablet Take 1 tablet (20 mEq total) by mouth 2 (two) times daily. 180 tablet 3  . predniSONE (DELTASONE) 5 MG tablet Take 5 mg by mouth daily with breakfast.    . rifaximin (XIFAXAN) 550 MG TABS tablet Take 550 mg by mouth 2 (two) times daily.    Marland Kitchen torsemide (DEMADEX) 20 MG tablet Take 20 mg by mouth 2 (two) times daily.     No current facility-administered medications for this visit.    Allergies as of 07/09/2015 - Review Complete 07/09/2015  Allergen Reaction Noted  . Xarelto [rivaroxaban]  06/17/2015    Family History  Problem Relation Age of Onset  . Hypertension Mother   . Cancer Father   . Stroke Brother   . Liver cancer Paternal Grandfather   . Rheum arthritis Maternal Aunt   . Asthma Child   . Emphysema Brother     smoked  . Colon cancer Neg Hx     Social History   Social History  . Marital Status: Married    Spouse Name: N/A  . Number of Children: N/A  . Years of Education: N/A   Occupational History  . Retired Special educational needs teacher    Social History Main Topics  . Smoking status: Former Smoker -- 3.00 packs/day for 35 years    Types: Cigarettes    Start date: 09/15/1961    Quit date: 11/23/1996  . Smokeless tobacco: Current User    Types: Chew  . Alcohol Use: No  . Drug Use: No  . Sexual Activity: Yes   Other Topics Concern  . Not on file  Social History Narrative   Married, retired.     Review of Systems: Gen: +fatigue, weak CV: rare palpitations Resp: +DOE  GI: see HPI  GU : Denies urinary burning, urinary frequency, urinary  hesitancy MS: chronic muscle aches/pain  Derm: Denies rash, itching, dry skin Psych: Denies depression, anxiety, memory loss, and confusion Heme: Denies bruising, bleeding, and enlarged lymph nodes.  Physical Exam: BP 117/65 mmHg  Pulse 77  Temp(Src) 97.3 F (36.3 C) (Oral)  Ht 5\' 11"  (1.803 m)  Wt 218 lb 6.4 oz (99.066 kg)  BMI 30.47 kg/m2 General:   Alert and oriented. Pleasant and cooperative. Well-nourished and well-developed.  Head:  Normocephalic and atraumatic. Eyes:  Without icterus, sclera clear and conjunctiva pink.  Ears:  Normal auditory acuity. Nose:  No deformity, discharge,  or lesions. Mouth:  No deformity or lesions, oral mucosa pink.  Lungs:  Clear to auscultation bilaterally. No wheezes, rales, or rhonchi. No distress.  Heart:  S1, S2 present without murmurs appreciated.  Abdomen:  +BS, soft, non-tender and non-distended. No HSM noted. No guarding or rebound. No masses appreciated.  Rectal:  Deferred  Msk:  Symmetrical without gross deformities. Normal posture. Extremities:  Without  edema. Neurologic:  Alert and  oriented x4;  grossly normal neurologically. Psych:  Alert and cooperative. Normal mood and affect.  Lab Results  Component Value Date   ALT 38 12/26/2014   AST 68* 12/26/2014   ALKPHOS 104 12/26/2014   BILITOT 0.9 12/26/2014   Lab Results  Component Value Date   CREATININE 2.12* 12/31/2014   BUN 44* 12/31/2014   NA 133* 12/31/2014   K 3.8 12/31/2014   CL 96* 12/31/2014   CO2 28 12/31/2014

## 2015-07-09 NOTE — Telephone Encounter (Signed)
Called wife back and LMOM with new appt times.   Appointments are on 07/16/2015. Pre-op is at 1100am and CT is after that

## 2015-07-09 NOTE — Telephone Encounter (Signed)
Called pt back and spoke with patients wife. She states the pre-op and Ct need to be rescheduled.

## 2015-07-09 NOTE — Telephone Encounter (Signed)
Mailed new letter with appointments to patient

## 2015-07-10 LAB — CBC
HCT: 38.4 % — ABNORMAL LOW (ref 39.0–52.0)
HEMOGLOBIN: 12.5 g/dL — AB (ref 13.0–17.0)
MCH: 31.7 pg (ref 26.0–34.0)
MCHC: 32.6 g/dL (ref 30.0–36.0)
MCV: 97.5 fL (ref 78.0–100.0)
MPV: 11.1 fL (ref 8.6–12.4)
Platelets: 93 10*3/uL — ABNORMAL LOW (ref 150–400)
RBC: 3.94 MIL/uL — AB (ref 4.22–5.81)
RDW: 15 % (ref 11.5–15.5)
WBC: 6.1 10*3/uL (ref 4.0–10.5)

## 2015-07-10 LAB — HEPATITIS B CORE ANTIBODY, TOTAL: Hep B Core Total Ab: NONREACTIVE

## 2015-07-10 LAB — AMMONIA: AMMONIA: 100 umol/L — AB (ref 16–53)

## 2015-07-10 LAB — HEPATITIS C ANTIBODY: HCV Ab: NEGATIVE

## 2015-07-10 LAB — PROTIME-INR
INR: 1.16 (ref <1.50)
Prothrombin Time: 14.9 seconds (ref 11.6–15.2)

## 2015-07-10 LAB — HEPATITIS A ANTIBODY, TOTAL: Hep A Total Ab: NONREACTIVE

## 2015-07-10 LAB — MITOCHONDRIAL ANTIBODIES: Mitochondrial M2 Ab, IgG: <=20 Units (ref <=20.0)

## 2015-07-11 LAB — ANTI-NUCLEAR AB-TITER (ANA TITER): ANA Titer 1: 1:40 {titer} — ABNORMAL HIGH

## 2015-07-11 LAB — ANA: ANA: POSITIVE — AB

## 2015-07-11 LAB — ANTI-SMOOTH MUSCLE ANTIBODY, IGG: SMOOTH MUSCLE AB: 27 U — AB (ref <20)

## 2015-07-11 LAB — CERULOPLASMIN: CERULOPLASMIN: 29 mg/dL (ref 18–36)

## 2015-07-15 NOTE — Patient Instructions (Signed)
[name]  07/15/2015     @[email or web address] @   Your procedure is scheduled on  07/22/2015   Report to [facility] at  800  A.M.  Call this number if you have problems the morning of surgery:  [phone]   Remember:  Do not eat food or drink liquids after midnight.  Take these medicines the morning of surgery with A SIP OF WATER  Zyrtec, cardiazem, cymbalta, claritin, metoprolol, oxycodone, prednisone, xifaxan. Take 1/2 of your usual insulin dosage. Take your inhaler and your nebulizer before you come.   Do not wear jewelry, make-up or nail polish.  Do not wear lotions, powders, or perfumes.  You may wear deodorant.  Do not shave 48 hours prior to surgery.  Men may shave face and neck.  Do not bring valuables to the hospital.  [facility] is not responsible for any belongings or valuables.  Contacts, dentures or bridgework may not be worn into surgery.  Leave your suitcase in the car.  After surgery it may be brought to your room.  For patients admitted to the hospital, discharge time will be determined by your treatment team.  Patients discharged the day of surgery will not be allowed to drive home.   Name and phone number of your driver:   family Special instructions:  Follow the diet instructions given to you by Dr [facility] office.  Please read over the following fact sheets that you were given. Coughing and Deep Breathing, MRSA Information, Surgical Site Infection Prevention and Anesthesia Post-op Instructions      Esophagogastroduodenoscopy Esophagogastroduodenoscopy (EGD) is a procedure that is used to examine the lining of the esophagus, stomach, and first part of the small intestine (duodenum). A long, flexible, lighted tube with a camera attached (endoscope) is inserted down the throat to view these organs. This procedure is done to detect problems or abnormalities, such as inflammation, bleeding, ulcers, or growths, in order to treat them. The  procedure lasts 5-20 minutes. It is usually an outpatient procedure, but it may need to be performed in a hospital in emergency cases. LET [facility] CARE PROVIDER KNOW ABOUT:  Any allergies you have.  All medicines you are taking, including vitamins, herbs, eye drops, creams, and over-the-counter medicines.  Previous problems you or members of your family have had with the use of anesthetics.  Any blood disorders you have.  Previous surgeries you have had.  Medical conditions you have. RISKS AND COMPLICATIONS Generally, this is a safe procedure. However, problems can occur and include:  Infection.  Bleeding.  Tearing (perforation) of the esophagus, stomach, or duodenum.  Difficulty breathing or not being able to breathe.  Excessive sweating.  Spasms of the larynx.  Slowed heartbeat.  Low blood pressure. BEFORE THE PROCEDURE  Do not eat or drink anything after midnight on the night before the procedure or as directed by your health care provider.  Do not take your regular medicines before the procedure if your health care provider asks you not to. Ask your health care provider about changing or stopping those medicines.  If you wear dentures, be prepared to remove them before the procedure.  Arrange for someone to drive you home after the procedure. PROCEDURE  A numbing medicine (local anesthetic) may be sprayed in your throat for comfort and to stop you from gagging or coughing.  You will have an IV tube inserted in a vein in your hand or arm. You  will receive medicines and fluids through this tube.  You will be given a medicine to relax you (sedative).  A pain reliever will be given through the IV tube.  A mouth guard may be placed in your mouth to protect your teeth and to keep you from biting on the endoscope.  You will be asked to lie on your left side.  The endoscope will be inserted down your throat and into your esophagus, stomach, and duodenum.  Air  will be put through the endoscope to allow your health care provider to clearly view the lining of your esophagus.  The lining of your esophagus, stomach, and duodenum will be examined. During the exam, your health care provider may:  Remove tissue to be examined under a microscope (biopsy) for inflammation, infection, or other medical problems.  Remove growths.  Remove objects (foreign bodies) that are stuck.  Treat any bleeding with medicines or other devices that stop tissues from bleeding (hot cautery, clipping devices).  Widen (dilate) or stretch narrowed areas of your esophagus and stomach.  The endoscope will be withdrawn. AFTER THE PROCEDURE  You will be taken to a recovery area for observation. Your blood pressure, heart rate, breathing rate, and blood oxygen level will be monitored often until the medicines you were given have worn off.  Do not eat or drink anything until the numbing medicine has worn off and your gag reflex has returned. You may choke.  Your health care provider should be able to discuss his or her findings with you. It will take longer to discuss the test results if any biopsies were taken.   This information is not intended to replace advice given to you by your health care provider. Make sure you discuss any questions you have with your health care provider.   Document Released: 08/20/2004 Document Revised: 05/10/2014 Document Reviewed: 03/22/2012 Elsevier Interactive Patient Education 2016 [employer]. Esophagogastroduodenoscopy, Care After Refer to this sheet in the next few weeks. These instructions provide you with information about caring for yourself after your procedure. Your health care provider may also give you more specific instructions. Your treatment has been planned according to current medical practices, but problems sometimes occur. Call your health care provider if you have any problems or questions after your procedure. WHAT TO EXPECT  AFTER THE PROCEDURE After your procedure, it is typical to feel:  Soreness in your throat.  Pain with swallowing.  Sick to your stomach (nauseous).  Bloated.  Dizzy.  Fatigued. HOME CARE INSTRUCTIONS  Do not eat or drink anything until the numbing medicine (local anesthetic) has worn off and your gag reflex has returned. You will know that the local anesthetic has worn off when you can swallow comfortably.  Do not drive or operate machinery until directed by your health care provider.  Take medicines only as directed by your health care provider. SEEK MEDICAL CARE IF:   You cannot stop coughing.  You are not urinating at all or less than usual. SEEK IMMEDIATE MEDICAL CARE IF:  You have difficulty swallowing.  You cannot eat or drink.  You have worsening throat or chest pain.  You have dizziness or lightheadedness or you faint.  You have nausea or vomiting.  You have chills.  You have a fever.  You have severe abdominal pain.  You have black, tarry, or bloody stools.   This information is not intended to replace advice given to you by your health care provider. Make sure you discuss any questions  you have with your health care provider.   Document Released: 04/05/2012 Document Revised: 05/10/2014 Document Reviewed: 04/05/2012 Elsevier Interactive Patient Education 2016 Else[employer]. Esophageal Dilatation Esophageal dilatation is a procedure to open a blocked or narrowed part of the esophagus. The esophagus is the long tube in your throat that carries food and liquid from your mouth to your stomach. The procedure is also called esophageal dilation.  You may need this procedure if you have a buildup of scar tissue in your esophagus that makes it difficult, painful, or even impossible to swallow. This can be caused by gastroesophageal reflux disease (GERD). In rare cases, people need this procedure because they have cancer of the esophagus or a problem with the way  food moves through the esophagus. Sometimes you may need to have another dilatation to enlarge the opening of the esophagus gradually. LET [facility] CARE PROVIDER KNOW ABOUT:   Any allergies you have.  All medicines you are taking, including vitamins, herbs, eye drops, creams, and over-the-counter medicines.  Previous problems you or members of your family have had with the use of anesthetics.  Any blood disorders you have.  Previous surgeries you have had.  Medical conditions you have.  Any antibiotic medicines you are required to take before dental procedures. RISKS AND COMPLICATIONS Generally, this is a safe procedure. However, problems can occur and include:  Bleeding from a tear in the lining of the esophagus.  A hole (perforation) in the esophagus. BEFORE THE PROCEDURE  Do not eat or drink anything after midnight on the night before the procedure or as directed by your health care provider.  Ask your health care provider about changing or stopping your regular medicines. This is especially important if you are taking diabetes medicines or blood thinners.  Plan to have someone take you home after the procedure. PROCEDURE   You will be given a medicine that makes you relaxed and sleepy (sedative).  A medicine may be sprayed or gargled to numb the back of the throat.  Your health care provider can use various instruments to do an esophageal dilatation. During the procedure, the instrument used will be placed in your mouth and passed down into your esophagus. Options include:  Simple dilators. This instrument is carefully placed in the esophagus to stretch it.  Guided wire bougies. In this method, a flexible tube (endoscope) is used to insert a wire into the esophagus. The dilator is passed over this wire to enlarge the esophagus. Then the wire is removed.  Balloon dilators. An endoscope with a small balloon at the end is passed down into the esophagus. Inflating the  balloon gently stretches the esophagus and opens it up. AFTER THE PROCEDURE  Your blood pressure, heart rate, breathing rate, and blood oxygen level will be monitored often until the medicines you were given have worn off.  Your throat may feel slightly sore and will probably still feel numb. This will improve slowly over time.  You will not be allowed to eat or drink until the throat numbness has resolved.  If this is a same-day procedure, you may be allowed to go home once you have been able to drink, urinate, and sit on the edge of the bed without nausea or dizziness.  If this is a same-day procedure, you should have a friend or family member with you for the next 24 hours after the procedure.   This information is not intended to replace advice given to you by your health care  provider. Make sure you discuss any questions you have with your health care provider.   Document Released: 06/10/2005 Document Revised: 05/10/2014 Document Reviewed: 08/29/2013 Elsevier Interactive Patient Education 2016 Elsevier [employer]. PATIENT INSTRUCTIONS POST-ANESTHESIA  IMMEDIATELY FOLLOWING SURGERY:  Do not drive or operate machinery for the first twenty four hours after surgery.  Do not make any important decisions for twenty four hours after surgery or while taking narcotic pain medications or sedatives.  If you develop intractable nausea and vomiting or a severe headache please notify your doctor immediately.  FOLLOW-UP:  Please make an appointment with your surgeon as instructed. You do not need to follow up with anesthesia unless specifically instructed to do so.  WOUND CARE INSTRUCTIONS (if applicable):  Keep a dry clean dressing on the anesthesia/puncture wound site if there is drainage.  Once the wound has quit draining you may leave it open to air.  Generally you should leave the bandage intact for twenty four hours unless there is drainage.  If the epidural site drains for more than 36-48 hours please  call the anesthesia department.  QUESTIONS?:  Please feel free to call your physician or the hospital operator if you have any questions, and they will be happy to assist you.

## 2015-07-16 ENCOUNTER — Encounter (HOSPITAL_COMMUNITY): Payer: Self-pay

## 2015-07-16 ENCOUNTER — Ambulatory Visit (HOSPITAL_COMMUNITY)
Admission: RE | Admit: 2015-07-16 | Discharge: 2015-07-16 | Disposition: A | Discharge status: Home / Self Care | Payer: Medicare Other | Source: Ambulatory Visit | Attending: Gastroenterology | Admitting: Gastroenterology

## 2015-07-16 ENCOUNTER — Encounter (HOSPITAL_COMMUNITY)
Admission: RE | Admit: 2015-07-16 | Discharge: 2015-07-16 | Disposition: A | Discharge status: Home / Self Care | Payer: Medicare Other | Source: Ambulatory Visit | Attending: Gastroenterology | Admitting: Gastroenterology

## 2015-07-16 DIAGNOSIS — K4020 Bilateral inguinal hernia, without obstruction or gangrene, not specified as recurrent: Secondary | ICD-10-CM | POA: Insufficient documentation

## 2015-07-16 DIAGNOSIS — K439 Ventral hernia without obstruction or gangrene: Secondary | ICD-10-CM | POA: Diagnosis not present

## 2015-07-16 DIAGNOSIS — K7460 Unspecified cirrhosis of liver: Secondary | ICD-10-CM | POA: Diagnosis not present

## 2015-07-16 DIAGNOSIS — K8020 Calculus of gallbladder without cholecystitis without obstruction: Secondary | ICD-10-CM | POA: Diagnosis not present

## 2015-07-16 DIAGNOSIS — R188 Other ascites: Secondary | ICD-10-CM | POA: Insufficient documentation

## 2015-07-16 DIAGNOSIS — R634 Abnormal weight loss: Secondary | ICD-10-CM | POA: Diagnosis present

## 2015-07-16 LAB — SURGICAL PCR SCREEN
MRSA, PCR: NEGATIVE
STAPHYLOCOCCUS AUREUS: NEGATIVE

## 2015-07-16 MED ORDER — IOHEXOL 350 MG/ML SOLN
100.0000 mL | Freq: Once | INTRAVENOUS | Status: AC | PRN
  Administered 2015-07-16: 100 mL via INTRAVENOUS

## 2015-07-16 NOTE — Pre-Procedure Instructions (Signed)
Patient given information to sign up for my chart at home. 

## 2015-07-18 ENCOUNTER — Other Ambulatory Visit (HOSPITAL_COMMUNITY): Payer: Self-pay

## 2015-07-18 ENCOUNTER — Encounter: Payer: Self-pay | Admitting: Cardiology

## 2015-07-18 LAB — CUP PACEART REMOTE DEVICE CHECK
Battery Impedance: 158 Ohm
Battery Remaining Longevity: 110 mo
Battery Voltage: 2.79 V
Date Time Interrogation Session: 20170227172353
Implantable Lead Implant Date: 20160826
Implantable Lead Location: 753860
Implantable Lead Model: 5076
Lead Channel Setting Sensing Sensitivity: 4 mV
MDC IDC MSMT LEADCHNL RA IMPEDANCE VALUE: 0 Ohm
MDC IDC MSMT LEADCHNL RV IMPEDANCE VALUE: 580 Ohm
MDC IDC MSMT LEADCHNL RV PACING THRESHOLD AMPLITUDE: 0.75 V
MDC IDC MSMT LEADCHNL RV PACING THRESHOLD PULSEWIDTH: 0.4 ms
MDC IDC SET LEADCHNL RV PACING AMPLITUDE: 2.5 V
MDC IDC SET LEADCHNL RV PACING PULSEWIDTH: 0.4 ms
MDC IDC STAT BRADY RV PERCENT PACED: 76 %

## 2015-07-21 NOTE — Pre-Procedure Instructions (Signed)
Dr Darrick Penna does not want patient to hold plavix.

## 2015-07-22 ENCOUNTER — Ambulatory Visit (HOSPITAL_COMMUNITY)
Admission: RE | Admit: 2015-07-22 | Discharge: 2015-07-22 | Disposition: A | Discharge status: Home / Self Care | Payer: Medicare Other | Source: Ambulatory Visit | Attending: Gastroenterology | Admitting: Gastroenterology

## 2015-07-22 ENCOUNTER — Ambulatory Visit (HOSPITAL_COMMUNITY): Payer: Medicare Other | Admitting: Anesthesiology

## 2015-07-22 ENCOUNTER — Encounter (HOSPITAL_COMMUNITY)
Admission: RE | Disposition: A | Discharge status: Home / Self Care | Payer: Self-pay | Source: Ambulatory Visit | Attending: Gastroenterology

## 2015-07-22 ENCOUNTER — Encounter (HOSPITAL_COMMUNITY): Payer: Self-pay | Admitting: *Deleted

## 2015-07-22 DIAGNOSIS — Z952 Presence of prosthetic heart valve: Secondary | ICD-10-CM | POA: Diagnosis not present

## 2015-07-22 DIAGNOSIS — I4891 Unspecified atrial fibrillation: Secondary | ICD-10-CM | POA: Insufficient documentation

## 2015-07-22 DIAGNOSIS — M1991 Primary osteoarthritis, unspecified site: Secondary | ICD-10-CM | POA: Diagnosis not present

## 2015-07-22 DIAGNOSIS — Z7982 Long term (current) use of aspirin: Secondary | ICD-10-CM | POA: Insufficient documentation

## 2015-07-22 DIAGNOSIS — K3189 Other diseases of stomach and duodenum: Secondary | ICD-10-CM

## 2015-07-22 DIAGNOSIS — Z13810 Encounter for screening for upper gastrointestinal disorder: Secondary | ICD-10-CM | POA: Diagnosis not present

## 2015-07-22 DIAGNOSIS — K219 Gastro-esophageal reflux disease without esophagitis: Secondary | ICD-10-CM | POA: Diagnosis not present

## 2015-07-22 DIAGNOSIS — Z950 Presence of cardiac pacemaker: Secondary | ICD-10-CM | POA: Diagnosis not present

## 2015-07-22 DIAGNOSIS — I2510 Atherosclerotic heart disease of native coronary artery without angina pectoris: Secondary | ICD-10-CM | POA: Insufficient documentation

## 2015-07-22 DIAGNOSIS — I5032 Chronic diastolic (congestive) heart failure: Secondary | ICD-10-CM | POA: Insufficient documentation

## 2015-07-22 DIAGNOSIS — M069 Rheumatoid arthritis, unspecified: Secondary | ICD-10-CM | POA: Diagnosis not present

## 2015-07-22 DIAGNOSIS — Z951 Presence of aortocoronary bypass graft: Secondary | ICD-10-CM | POA: Insufficient documentation

## 2015-07-22 DIAGNOSIS — Z87891 Personal history of nicotine dependence: Secondary | ICD-10-CM | POA: Diagnosis not present

## 2015-07-22 DIAGNOSIS — I8500 Esophageal varices without bleeding: Secondary | ICD-10-CM

## 2015-07-22 DIAGNOSIS — K766 Portal hypertension: Secondary | ICD-10-CM

## 2015-07-22 DIAGNOSIS — Z955 Presence of coronary angioplasty implant and graft: Secondary | ICD-10-CM | POA: Insufficient documentation

## 2015-07-22 DIAGNOSIS — Z01810 Encounter for preprocedural cardiovascular examination: Secondary | ICD-10-CM | POA: Diagnosis not present

## 2015-07-22 DIAGNOSIS — J449 Chronic obstructive pulmonary disease, unspecified: Secondary | ICD-10-CM | POA: Diagnosis not present

## 2015-07-22 DIAGNOSIS — Z794 Long term (current) use of insulin: Secondary | ICD-10-CM | POA: Insufficient documentation

## 2015-07-22 DIAGNOSIS — Z79899 Other long term (current) drug therapy: Secondary | ICD-10-CM | POA: Insufficient documentation

## 2015-07-22 DIAGNOSIS — Z7901 Long term (current) use of anticoagulants: Secondary | ICD-10-CM | POA: Insufficient documentation

## 2015-07-22 DIAGNOSIS — Z7902 Long term (current) use of antithrombotics/antiplatelets: Secondary | ICD-10-CM | POA: Diagnosis not present

## 2015-07-22 DIAGNOSIS — I110 Hypertensive heart disease with heart failure: Secondary | ICD-10-CM | POA: Insufficient documentation

## 2015-07-22 HISTORY — PX: SAVORY DILATION: SHX5439

## 2015-07-22 HISTORY — PX: ESOPHAGOGASTRODUODENOSCOPY (EGD) WITH PROPOFOL: SHX5813

## 2015-07-22 LAB — GLUCOSE, CAPILLARY
GLUCOSE-CAPILLARY: 114 mg/dL — AB (ref 65–99)
GLUCOSE-CAPILLARY: 121 mg/dL — AB (ref 65–99)

## 2015-07-22 SURGERY — ESOPHAGOGASTRODUODENOSCOPY (EGD) WITH PROPOFOL
Anesthesia: Monitor Anesthesia Care

## 2015-07-22 MED ORDER — LIDOCAINE HCL (PF) 1 % IJ SOLN
INTRAMUSCULAR | Status: AC
  Filled 2015-07-22: qty 5

## 2015-07-22 MED ORDER — EPHEDRINE SULFATE 50 MG/ML IJ SOLN
INTRAMUSCULAR | Status: AC
  Filled 2015-07-22: qty 1

## 2015-07-22 MED ORDER — PANTOPRAZOLE SODIUM 40 MG PO TBEC: DELAYED_RELEASE_TABLET | ORAL | Status: AC

## 2015-07-22 MED ORDER — LIDOCAINE VISCOUS 2 % MT SOLN
15.0000 mL | Freq: Once | OROMUCOSAL | Status: AC
  Administered 2015-07-22: 6 mL via OROMUCOSAL

## 2015-07-22 MED ORDER — LIDOCAINE HCL (CARDIAC) 10 MG/ML IV SOLN
INTRAVENOUS | Status: DC | PRN
  Administered 2015-07-22: 30 mg via INTRAVENOUS

## 2015-07-22 MED ORDER — ONDANSETRON HCL 4 MG/2ML IJ SOLN: 4.0000 mg | Freq: Once | INTRAMUSCULAR | Status: DC | PRN

## 2015-07-22 MED ORDER — PROPOFOL 500 MG/50ML IV EMUL
INTRAVENOUS | Status: DC | PRN
  Administered 2015-07-22: 25 ug/kg/min via INTRAVENOUS

## 2015-07-22 MED ORDER — IPRATROPIUM-ALBUTEROL 0.5-2.5 (3) MG/3ML IN SOLN
3.0000 mL | Freq: Once | RESPIRATORY_TRACT | Status: AC
  Administered 2015-07-22: 3 mL via RESPIRATORY_TRACT

## 2015-07-22 MED ORDER — LACTATED RINGERS IV SOLN
INTRAVENOUS | Status: DC
  Administered 2015-07-22: 08:00:00 via INTRAVENOUS

## 2015-07-22 MED ORDER — SUCCINYLCHOLINE CHLORIDE 20 MG/ML IJ SOLN
INTRAMUSCULAR | Status: AC
  Filled 2015-07-22: qty 1

## 2015-07-22 MED ORDER — MIDAZOLAM HCL 2 MG/2ML IJ SOLN
INTRAMUSCULAR | Status: AC
  Filled 2015-07-22: qty 2

## 2015-07-22 MED ORDER — PROPOFOL 10 MG/ML IV BOLUS
INTRAVENOUS | Status: AC
Start: 2015-07-22 — End: 2015-07-22
  Filled 2015-07-22: qty 20

## 2015-07-22 MED ORDER — MIDAZOLAM HCL 2 MG/2ML IJ SOLN
1.0000 mg | INTRAMUSCULAR | Status: DC | PRN
  Administered 2015-07-22: 1 mg via INTRAVENOUS
  Administered 2015-07-22: 2 mg via INTRAVENOUS

## 2015-07-22 MED ORDER — GLYCOPYRROLATE 0.2 MG/ML IJ SOLN
INTRAMUSCULAR | Status: AC
  Filled 2015-07-22: qty 1

## 2015-07-22 MED ORDER — SODIUM CHLORIDE 0.9 % IJ SOLN
INTRAMUSCULAR | Status: AC
  Filled 2015-07-22: qty 10

## 2015-07-22 MED ORDER — LIDOCAINE VISCOUS 2 % MT SOLN
OROMUCOSAL | Status: AC
  Filled 2015-07-22: qty 15

## 2015-07-22 MED ORDER — FENTANYL CITRATE (PF) 100 MCG/2ML IJ SOLN: 25.0000 ug | INTRAMUSCULAR | Status: DC | PRN

## 2015-07-22 MED ORDER — NADOLOL 20 MG PO TABS: 20.0000 mg | ORAL_TABLET | Freq: Every day | ORAL | Status: DC

## 2015-07-22 MED ORDER — IPRATROPIUM-ALBUTEROL 0.5-2.5 (3) MG/3ML IN SOLN
RESPIRATORY_TRACT | Status: AC
  Filled 2015-07-22: qty 3

## 2015-07-22 MED ORDER — PROPOFOL 10 MG/ML IV BOLUS
INTRAVENOUS | Status: AC
  Filled 2015-07-22: qty 20

## 2015-07-22 NOTE — Progress Notes (Signed)
REVIEWED. AGREE. NO ADDITIONAL RECOMMENDATIONS. 

## 2015-07-22 NOTE — Anesthesia Postprocedure Evaluation (Signed)
Anesthesia Post Note  Patient: [name]  Procedure(s) Performed: Procedure(s) (LRB): ESOPHAGOGASTRODUODENOSCOPY (EGD) WITH PROPOFOL (N/A) SAVORY DILATION (N/A)  Patient location during evaluation: PACU Anesthesia Type: MAC Level of consciousness: awake Pain management: satisfactory to patient Vital Signs Assessment: post-procedure vital signs reviewed and stable Respiratory status: spontaneous breathing Cardiovascular status: stable Anesthetic complications: no    Last Vitals:  Filed Vitals:   07/22/15 1020 07/22/15 1025  BP: 105/51 100/49  Temp:    Resp: 16 15    Last Pain: There were no vitals filed for this visit.               Minerva Areola

## 2015-07-22 NOTE — Progress Notes (Signed)
Contacted by preop nurse to discuss pacemaker with patient and his wife prior to EGD. Patient with complete heart block, has medtronic pacemaker with normal function 06/30/15. EKG shows appropriately V-pacing at 60. Ok to proceed with pacemaker, if any bovie cauteration is required will need magnet placement during procedure.    Dominga Ferry MD

## 2015-07-22 NOTE — Transfer of Care (Signed)
Immediate Anesthesia Transfer of Care Note  Patient: [name]  Procedure(s) Performed: Procedure(s) with comments: ESOPHAGOGASTRODUODENOSCOPY (EGD) WITH PROPOFOL (N/A) - 0930 SAVORY DILATION (N/A)  Patient Location: PACU  Anesthesia Type:MAC  Level of Consciousness: sedated and patient cooperative  Airway & Oxygen Therapy: Patient Spontanous Breathing and non-rebreather face mask  Post-op Assessment: Report given to RN, Post -op Vital signs reviewed and stable and Patient moving all extremities  Post vital signs: Reviewed and stable   Complications: No apparent anesthesia complications

## 2015-07-22 NOTE — OR Nursing (Signed)
Rep for patients pacemaker paged.

## 2015-07-22 NOTE — Anesthesia Preprocedure Evaluation (Signed)
Anesthesia Evaluation  Patient identified by MRN, date of birth, ID band Patient awake    Reviewed: Allergy & Precautions, NPO status , Patient's Chart, lab work & pertinent test results, reviewed documented beta blocker date and time   History of Anesthesia Complications (+) PONV  Airway Mallampati: II  TM Distance: >3 FB Neck ROM: Full    Dental  (+) Missing, Poor Dentition, Upper Dentures,    Pulmonary shortness of breath and with exertion, pneumonia, resolved, COPD,  COPD inhaler, former smoker,     + decreased breath sounds      Cardiovascular hypertension, Pt. on medications and Pt. on home beta blockers + CAD, + CABG and +CHF  + dysrhythmias Atrial Fibrillation + pacemaker + Valvular Problems/Murmurs AS  Rate:Normal     Neuro/Psych    GI/Hepatic GERD  Medicated and Controlled,  Endo/Other  diabetes, Well Controlled, Type 2, Insulin Dependent  Renal/GU Renal Insufficiency and ARFRenal disease     Musculoskeletal  (+) Arthritis , Osteoarthritis,    Abdominal (+) + obese,   Peds  Hematology  (+) anemia ,   Anesthesia Other Findings   Reproductive/Obstetrics                             Anesthesia Physical Anesthesia Plan  ASA: IV  Anesthesia Plan: MAC   Post-op Pain Management:    Induction: Intravenous  Airway Management Planned: Nasal Cannula  Additional Equipment:   Intra-op Plan:   Post-operative Plan:   Informed Consent: I have reviewed the patients History and Physical, chart, labs and discussed the procedure including the risks, benefits and alternatives for the proposed anesthesia with the patient or authorized representative who has indicated his/her understanding and acceptance.   Dental advisory given  Plan Discussed with: CRNA  Anesthesia Plan Comments:         Anesthesia Quick Evaluation

## 2015-07-22 NOTE — H&P (Signed)
Primary Care Physician:  Donzetta Sprung, MD Primary Gastroenterologist:  Dr. Darrick Penna  Pre-Procedure History & Physical: HPI:  [name] is a 70 y.o. male here for SCREENING FOR VARICES.  Past Medical History  Diagnosis Date  . COPD (chronic obstructive pulmonary disease) (HCC)   . Elevated liver function tests   . Hypertension   . Aortic stenosis     a. 2D ECHO: 01/30/2014: ef 50-55%. No RWMAs. Severe UJ:WJXBJ area (VTI): 0.82 cm^2. Valve area (Vmax): 0.74 cm^2. Valve area (Vmean): 0.75 Cm^2. Mod LA/RA dilation. PA peak pressure 33  . CAD (coronary artery disease)   . GERD (gastroesophageal reflux disease)   . Chronic atrial flutter (HCC)   . 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 Morbid obesity (HCC)   . Coronary atherosclerosis of artery bypass graft 2010  . Acute on chronic diastolic heart failure (HCC) 02/01/2014  . Chronic diastolic congestive heart failure (HCC) 02/01/2014  . Epistaxis     Developed on Xarelto therapy  . Cirrhosis (HCC) 02/01/2014  . Portal hypertension (HCC) 02/01/2014  . Chronic pancreatitis (HCC) 02/01/2014    Noted on CT scan  . Shortness of breath   . Dizziness   . Heart murmur   . Asthma     uses nebulizers daily  . Dysrhythmia     fib/flutter  . Anemia     was on xalerto sept 2015 for afib - was taken off of - had to get unit of prbc  . History of blood transfusion 01/2014    "right after starting Xeralto"  . 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 Hyperlipidemia   . Pneumonia "several times:  . Type II diabetes mellitus (HCC)   . Rheumatoid arthritis(714.0)     "everywhere"  . Degenerative disc disease, cervical   . Degenerative disc disease, lumbar 2010  . Osteoarthritis     "everywhere"  . Chronic back pain     "lower back  usually; upper back fior the last couple weeks"  (12/26/2014)  . ARF (acute renal failure) (HCC) 11/2014  . On home oxygen therapy     "2.5L at night & prn during the day" (12/26/2014)  . Other myositis, left thigh dx'd 12/26/2014  . Acute renal failure (HCC) 12/30/2014  . Atrial fibrillation (HCC) 12/30/2014  . 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 Hyposmolality and/or hyponatremia 12/30/2014  . CHB (complete heart block) (HCC) 12/30/2014  . Elevated troponin level, secondary to CHB acute renal failure 12/30/2014  . Complication of anesthesia     "has a hard time breathing when he wakes up"    Past Surgical History  Procedure Laterality Date  . Coronary angioplasty with stent placement  01/02/2009    s/p bare metal stent to the vein graft to the RCA 9/20. nml LV function   . Appendectomy  1950  . Coronary artery bypass graft  03/27/1997    CABG x 5 by Dr Cornelius Moras  . Anterior cervical decomp/discectomy fusion  12/2006  . Tonsillectomy and adenoidectomy  1950  . Lumbar disc surgery  2011    "put plastic wedge in"  . Esophagogastroduodenoscopy (egd) with propofol N/A 02/01/2014    Dr. Bosie Clos: small distal esophageal varices and mild  portal gastropathy  . Colonoscopy with propofol N/A 02/01/2014    Sigmoid diverticulosis   . Multiple extractions with alveoloplasty N/A 03/12/2014    Procedure: MULTIPLE EXTRACTION OF TOOTH #'S 2,6,7,8,9,10,11,14,18,20,29,31 WITH ALVEOLOPLASTY AND GROSS DEBRIDMENT OF REMAINING TEETH;  Surgeon: Charlynne Pander, DDS;  Location: [facility] OR;  Service: Oral Surgery;  Laterality: N/A;  . Back surgery    . Transcatheter aortic valve replacement, transfemoral N/A 04/03/2014    Procedure: TRANSCATHETER AORTIC VALVE REPLACEMENT, TRANSFEMORAL;  Surgeon: Purcell Nails, MD;  Location: [facility] OR;  Service: Open Heart Surgery;  Laterality: N/A;  . Intraoperative transesophageal echocardiogram N/A 04/03/2014    Procedure: INTRAOPERATIVE  TRANSESOPHAGEAL ECHOCARDIOGRAM;  Surgeon: Purcell Nails, MD;  Location: [facility] OR;  Service: Open Heart Surgery;  Laterality: N/A;  . Left and right heart catheterization with coronary/graft angiogram N/A 02/05/2014    Procedure: LEFT AND RIGHT HEART CATHETERIZATION WITH Isabel Caprice;  Surgeon: Peter M Swaziland, MD;  Location: [facility] CATH LAB;  Service: Cardiovascular;  Laterality: N/A;  . Posterior laminectomy / decompression cervical spine  05/2007  . Ep implantable device N/A 12/27/2014    Procedure: Pacemaker Implant;  Surgeon: Marinus Maw, MD;  Location: [facility] INVASIVE CV LAB;  Service: Cardiovascular;  Laterality: N/A;  . Insert / replace / remove pacemaker      Prior to Admission medications   Medication Sig Start Date End Date Taking? Authorizing Provider  albuterol-ipratropium (COMBIVENT) 18-103 MCG/ACT inhaler Inhale 2 puffs into the lungs every 6 (six) hours as needed for wheezing or shortness of breath.    Yes Historical Provider, MD  Artificial Tear Ointment (DRY EYES OP) Apply 1 drop to eye daily as needed (dry eyes).   Yes Historical Provider, MD  ascorbic acid (VITAMIN C) 500 MG tablet Take 500 mg by mouth 2 (two) times daily.    Yes Historical Provider, MD  aspirin 81 MG tablet Take 1 tablet (81 mg total) by mouth daily. 02/07/14  Yes Rhonda G Barrett, PA-C  cetirizine (ZYRTEC) 10 MG tablet Take 10 mg by mouth at bedtime.   Yes Historical Provider, MD  Citalopram Hydrobromide (CELEXA PO) Take by mouth daily.   Yes Historical Provider, MD  clopidogrel (PLAVIX) 75 MG tablet Take 1 tablet (75 mg total) by mouth daily. 03/10/15  Yes Laqueta Linden, MD  co-enzyme Q-10 30 MG capsule Take 30 mg by mouth at bedtime.    Yes Historical Provider, MD  CRESTOR 5 MG tablet Take 5 mg by mouth every other day. 3 day weekly 04/11/15  Yes Historical Provider, MD  desonide (DESOWEN) 0.05 % cream Apply 1 application topically 2 (two) times daily as needed (for rash).    Yes Historical Provider,  MD  diltiazem (CARDIZEM CD) 120 MG 24 hr capsule Take 1 capsule (120 mg total) by mouth daily. 11/12/14  Yes Laqueta Linden, MD  DULoxetine (CYMBALTA) 30 MG capsule Take 30 mg by mouth daily.   Yes Historical Provider, MD  ezetimibe (ZETIA) 10 MG tablet Take 10 mg by mouth at bedtime.    Yes Historical Provider, MD  flunisolide (NASAREL) 29 MCG/ACT (0.025%) nasal spray Place 2 sprays into the nose at bedtime as needed for rhinitis (for congestion). Dose is for each nostril.   Yes Historical Provider, MD  insulin NPH-regular Human (NOVOLIN 70/30) (70-30) 100 UNIT/ML injection Inject 15 Units into the skin 2 (two) times daily. Patient taking differently: Inject 15-20 Units into the skin 2 (two) times daily. 40 as a base; <180 =  40 units 11/04/14  Yes Erick Blinks, MD  ipratropium-albuterol (DUONEB) 0.5-2.5 (3) MG/3ML SOLN Take 3 mLs by nebulization every 6 (six) hours as needed (for shortness of breath).   Yes Historical Provider, MD  ketoconazole (NIZORAL) 2 % cream Apply 1 application topically 2 (two) times daily as needed for irritation.    Yes Historical Provider, MD  KRISTALOSE 20 G packet Take 10 g by mouth 2 (two) times daily.  12/15/14  Yes Historical Provider, MD  loratadine (CLARITIN) 10 MG tablet Take 10 mg by mouth every morning.    Yes Historical Provider, MD  metoprolol succinate (TOPROL-XL) 25 MG 24 hr tablet Take 1 tablet (25 mg total) by mouth daily. 12/31/14  Yes Amber Caryl Bis, NP  Multiple Vitamin (MULTIVITAMIN) tablet Take 1 tablet by mouth daily.     Yes Historical Provider, MD  oxyCODONE-acetaminophen (PERCOCET/ROXICET) 5-325 MG per tablet Take 1-2 tablets by mouth every 4 (four) hours as needed for moderate pain. Patient taking differently: Take 1 tablet by mouth every 4 (four) hours as needed for moderate pain.  03/12/14  Yes Charlynne Pander, DDS  potassium chloride SA (K-DUR,KLOR-CON) 20 MEQ tablet Take 1 tablet (20 mEq total) by mouth 2 (two) times daily. 05/01/15  Yes  Laqueta Linden, MD  predniSONE (DELTASONE) 5 MG tablet Take 5 mg by mouth daily with breakfast.   Yes Historical Provider, MD  rifaximin (XIFAXAN) 550 MG TABS tablet Take 550 mg by mouth 2 (two) times daily.   Yes Historical Provider, MD  torsemide (DEMADEX) 20 MG tablet Take 20 mg by mouth 2 (two) times daily.   Yes Historical Provider, MD    Allergies as of 07/09/2015 - Review Complete 07/09/2015  Allergen Reaction Noted  . Xarelto [rivaroxaban]  06/17/2015    Family History  Problem Relation Age of Onset  . Hypertension Mother   . Cancer Father   . Stroke Brother   . Liver cancer Paternal Grandfather   . Rheum arthritis Maternal Aunt   . Asthma Child   . Emphysema Brother     smoked  . Colon cancer Neg Hx     Social History   Social History  . Marital Status: Married    Spouse Name: N/A  . Number of Children: N/A  . Years of Education: N/A   Occupational History  . Retired Special educational needs teacher    Social History Main Topics  . Smoking status: Former Smoker -- 3.00 packs/day for 35 years    Types: Cigarettes    Start date: 09/15/1961    Quit date: 11/23/1996  . Smokeless tobacco: Current User    Types: Chew  . Alcohol Use: No  . Drug Use: No  . Sexual Activity: Yes   Other Topics Concern  . Not on file   Social History Narrative   Married, retired.     Review of Systems: See HPI, otherwise negative ROS   Physical Exam: BP 98/56 mmHg  Temp(Src) 98 F (36.7 C) (Oral)  Resp 11  Ht 5\' 11"  (1.803 m)  Wt 225 lb (102.059 kg)  BMI 31.39 kg/m2  SpO2 97% General:   Alert,  pleasant and cooperative in NAD Head:  Normocephalic and atraumatic. Neck:  Supple; Lungs:  Clear throughout to auscultation.    Heart:  Regular rate and rhythm. Abdomen:  Soft, nontender and nondistended. Normal bowel sounds, without guarding, and without rebound.   Neurologic:  Alert and  oriented x4;  grossly normal neurologically.  Impression/Plan:  SCREENING  VARICES  PLAN:  1.EGD TODAY

## 2015-07-22 NOTE — OR Nursing (Signed)
Per Dr. Wyline Mood ok to proceed with procedure.

## 2015-07-22 NOTE — OR Nursing (Signed)
Dr. Wyline Mood in to see patient , talking with wife  And patient..  New 12 lead performed and placed in chart.

## 2015-07-22 NOTE — OR Nursing (Signed)
Leta Jungling paging another rep to come to interrogate pacemaker.  Dr. Wyline Mood also notified since Dr. Darl Householder is working in [place] today.  Dr. Wyline Mood said he would be glad to see patient .

## 2015-07-22 NOTE — Op Note (Signed)
[facility] Patient Name: [name] Procedure Date: 07/22/2015 10:28 AM MRN: [id number] Date of Birth: [birth year] Attending MD: Jonette Eva , MD CSN: [id number] Age: 70 Admit Type: Outpatient Procedure:                Upper GI endoscopy Indications:              Esophageal varices Providers:                Jonette Eva, MD, Jannett Celestine, RN, Burke Keels,                            Technician Referring MD:             Lucita Lora. Reuel Boom (Referring MD) Medicines:                Propofol per Anesthesia Complications:            No immediate complications. Estimated Blood Loss:     Estimated blood loss was minimal. Procedure:                Pre-Anesthesia Assessment:                           - Prior to the procedure, a History and Physical                            was performed, and patient medications and                            allergies were reviewed. The patient's tolerance of                            previous anesthesia was also reviewed. The risks                            and benefits of the procedure and the sedation                            options and risks were discussed with the patient.                            All questions were answered, and informed consent                            was obtained. Prior Anticoagulants: The patient has                            taken Plavix (clopidogrel), last dose was day of                            procedure. ASA Grade Assessment: II - A patient                            with mild systemic disease. After reviewing the  risks and benefits, the patient was deemed in                            satisfactory condition to undergo the procedure.                           - Prior to the procedure, a History and Physical                            was performed, and patient medications and                            allergies were reviewed. The patient's tolerance of   previous anesthesia was also reviewed. The risks                            and benefits of the procedure and the sedation                            options and risks were discussed with the patient.                            All questions were answered, and informed consent                            was obtained. Prior Anticoagulants: The patient has                            taken aspirin, last dose was day of procedure. ASA                            Grade Assessment: II - A patient with mild systemic                            disease. After reviewing the risks and benefits,                            the patient was deemed in satisfactory condition to                            undergo the procedure.                           After obtaining informed consent, the endoscope was                            passed under direct vision. Throughout the                            procedure, the patient's blood pressure, pulse, and                            oxygen saturations were monitored continuously. The  [id number] ([id number]) scope was introduced through the                            mouth, and advanced to the second part of duodenum.                            The upper GI endoscopy was accomplished with ease.                            The patient tolerated the procedure well. Scope In: 10:42:02 AM Scope Out: 10:49:12 AM Total Procedure Duration: 0 hours 7 minutes 10 seconds  Findings:      Grade I varices were found in the distal esophagus.      Severe portal hypertensive gastropathy was found in the gastric fundus       and in the gastric body.      The duodenal bulb, first portion of the duodenum and second portion of       the duodenum were normal. Impression:               - Grade I esophageal varices.                           - Portal hypertensive gastropathy.                           - Normal duodenal bulb, first portion of the                             duodenum and second portion of the duodenum.                           - No specimens collected. Moderate Sedation:      Per Anesthesia Care Recommendation:           - Resume previous diet.                           - Give a beta blocker with dosage titrated by the                            heart rate.                           - Resume previous diet.                           - Return to my office in 6 months.                           STOP METOPROLOL. START NADOLOL TO DECREASE RISK OF                            BLEEDING FROM YOUR VARICES AND TO TREAT YOUR HEART                            CONDITION. Discussed with  Dr. Purvis Sheffield. BENEFITS                            OF CONTINUING ASA/PLAVIX OUTWEIGH RISK OF BLEEDING                            AT THIS TIME.                           TO PREVENT ULCERS AND BLOOD LOSS, START PROTONIX                            DUE TO NEED FOR CHRONIC ASPIRIN USE. TAKE 30                            MINUTES PRIOR TO BREAKFAST.                           REPEAT EGD IN 1-2 YEARS TO REASSESS VARICES &                            POSSIBLE BANDING.                           FOLLOW UP With Gerrit Halls NP on September 21, 10 am                           - Resume aspirin today and Plavix (clopidogrel)                            today at prior doses. Procedure Code(s):        --- Professional ---                           [phone], Esophagogastroduodenoscopy, flexible,                            transoral; diagnostic, including collection of                            specimen(s) by brushing or washing, when performed                            (separate procedure) Diagnosis Code(s):        --- Professional ---                           I85.00, Esophageal varices without bleeding                           K76.6, Portal hypertension                           K31.89, Other diseases of stomach and duodenum CPT copyright 2016 [employer]. All rights  reserved. The codes documented in this report are preliminary and upon coder review may  be revised  to meet current compliance requirements. Jonette Eva, MD Jonette Eva, MD 07/22/2015 8:43:48 PM This report has been signed electronically. Number of Addenda: 0

## 2015-07-22 NOTE — Discharge Instructions (Signed)
You have esophageal varices and portal hypertensive gastropathy. Both of these are due to cirrhosis.    STOP METOPROLOL. START NADOLOL TO DECREASE RISK OF BLEEDING FROM YOUR VARICES AND TO TREAT YOUR HEART CONDITION. SEE INFO REGARDING NADOLOL BELOW.  TO PREVENT ULCERS AND BLOOD LOSS, START PROTONIX DUE TO NEED FOR CHRONIC ASPIRIN USE. TAKE 30 MINUTES PRIOR TO BREAKFAST.  REPEAT EGD IN 1-2 YEARS TO REASSESS VARICES & POSSIBLE BANDING.  FOLLOW UP IN SEP 2017.  Appointment with Gerrit Halls NP on September 21, 10 am  UPPER ENDOSCOPY AFTER CARE Read the instructions outlined below and refer to this sheet in the next week. These discharge instructions provide you with general information on caring for yourself after you leave the hospital. While your treatment has been planned according to the most current medical practices available, unavoidable complications occasionally occur. If you have any problems or questions after discharge, call DR. FIELDS, [phone].  ACTIVITY  You may resume your regular activity, but move at a slower pace for the next 24 hours.   Take frequent rest periods for the next 24 hours.   Walking will help get rid of the air and reduce the bloated feeling in your belly (abdomen).   No driving for 24 hours (because of the medicine (anesthesia) used during the test).   You may shower.   Do not sign any important legal documents or operate any machinery for 24 hours (because of the anesthesia used during the test).    NUTRITION  Drink plenty of fluids.   You may resume your normal diet as instructed by your doctor.   Begin with a light meal and progress to your normal diet. Heavy or fried foods are harder to digest and may make you feel sick to your stomach (nauseated).   Avoid alcoholic beverages for 24 hours or as instructed.    MEDICATIONS  You may resume your normal medications.   WHAT YOU CAN EXPECT TODAY  Some feelings of bloating in the abdomen.    Passage of more gas than usual.    IF YOU HAD A BIOPSY TAKEN DURING THE UPPER ENDOSCOPY:  Eat a soft diet IF YOU HAVE NAUSEA, BLOATING, ABDOMINAL PAIN, OR VOMITING.    FINDING OUT THE RESULTS OF YOUR TEST Not all test results are available during your visit. DR. Darrick Penna WILL CALL YOU WITHIN 7 DAYS OF YOUR PROCEDUE WITH YOUR RESULTS. Do not assume everything is normal if you have not heard from DR. FIELDS IN ONE WEEK, CALL HER OFFICE AT [phone].  SEEK IMMEDIATE MEDICAL ATTENTION AND CALL THE OFFICE: [phone] IF:  You have more than a spotting of blood in your stool.   Your belly is swollen (abdominal distention).   You are nauseated or vomiting.   You have a temperature over 101F.   You have abdominal pain or discomfort that is severe or gets worse throughout the day.  Nadolol tablets  What is this medicine?   NADOLOL (nay DOE lole) is a beta-blocker. Beta-blockers reduce the workload on the heart and help it to beat more regularly. This medicine is used to treat high blood pressure and to relieve chest pain caused by angina.  This medicine may be used for other purposes; ask your health care provider or pharmacist if you have questions.   How should I use this medicine?  Take this medicine by mouth with a glass of water. Follow the directions on the prescription label. You can take this medicine with or without  food. Take your doses at regular intervals. Do not take your medicine more often than directed. Do not stop taking this medicine suddenly. This could lead to serious heart-related effects.    What if I miss a dose?   If you miss a dose, take it as soon as you can. If it is almost time for your next dose, take only that dose. Do not take double or extra doses.   What should I watch for?  Visit your doctor or health care professional for regular check ups. Check your blood pressure and pulse rate regularly. Ask your health care professional what your blood  pressure and pulse rate should be, and when you should contact them. You may get drowsy or dizzy. Do not drive, use machinery, or do anything that needs mental alertness until you know how this drug affects you. Do not stand or sit up quickly, especially if you are an older patient. This reduces the risk of dizzy or fainting spells. Alcohol can make you more drowsy and dizzy. Avoid alcoholic drinks.    What side effects may I notice from receiving this medicine?  Side effects that you should report to your doctor or health care professional as soon as possible:  -allergic reactions like skin rash, itching or hives  -cold, tingling, or numb hands or feet  -difficulty breathing, wheezing  -irregular heart beat  -mental depression  -slow heart rate  -swelling of the legs or ankles  -FATIGUE -change in sex drive or performance  -dry itching skin  -headache

## 2015-07-22 NOTE — OR Nursing (Signed)
12 lead done, Dr. Wyline Mood notified , he said he would be glad to see the patient this am.  Dr. Darl Householder  Is working in [place] today.   Also Leta Jungling is paging a rep from [place] to see patient today.

## 2015-07-22 NOTE — Anesthesia Procedure Notes (Signed)
Procedure Name: MAC Date/Time: 07/22/2015 10:27 AM Performed by: Franco Nones Pre-anesthesia Checklist: Patient identified, Emergency Drugs available, Suction available, Timeout performed and Patient being monitored Patient Re-evaluated:Patient Re-evaluated prior to inductionOxygen Delivery Method: Non-rebreather mask

## 2015-07-24 ENCOUNTER — Encounter (HOSPITAL_COMMUNITY): Payer: Self-pay | Admitting: Gastroenterology

## 2015-07-29 ENCOUNTER — Other Ambulatory Visit: Payer: Self-pay

## 2015-07-29 DIAGNOSIS — K551 Chronic vascular disorders of intestine: Secondary | ICD-10-CM

## 2015-07-29 DIAGNOSIS — R634 Abnormal weight loss: Secondary | ICD-10-CM

## 2015-07-29 DIAGNOSIS — K7460 Unspecified cirrhosis of liver: Secondary | ICD-10-CM

## 2015-07-29 NOTE — Progress Notes (Signed)
Quick Note:  CTA reviewed. Definitely has atherosclerotic disease. Mild narrowing at origin of celiac, 50% narrowing at origin of SMA but patent, IMA patent but severely diminutive. All of these are patent, but I would still like for him to see the Vascular Clinic in [place] to see if he possibly has any evidence of chronic mesenteric ischemia presentation. Doris, please see additional lab result notes. ______

## 2015-07-29 NOTE — Progress Notes (Signed)
Quick Note:  Ammonia slightly elevated, but this can be transient. Continue lactulose dosing to 3 soft bowel movements daily. Ceruloplasmin normal. His ASMA and ANA are positive but negative AMA. Hep C negative. No evidence of iron overload. Anemia likely multifactorial. MELD 10. I'm not convinced he has an autoimmune process. His ASMA/ANA could be positive in the setting of NASH. A liver biopsy would sort this out. I am not sure he wants to do that or not. Needs Hep A and B vaccination. I would suggest seeing Vascular Clinic in [place] due to findings on CTA first, then he can have repeat HFP in 3 months. I believe he is scheduled to return in September, but let's have him return earlier since he has a lot going on. Would recommend May 2017. ______

## 2015-07-29 NOTE — Progress Notes (Signed)
Quick Note:  Pt's wife is aware ( she said pt cannot hear over the phone). I am mailing the prescription for the Hep A and Hep B vaccinations and she is aware to take to [facility] in [place]. She said just wait to discuss the Vascular Clinic appt and liver biopsy at his visit in May. I am forwarding to [facility] to schedule appt in May. Lab orders on file for June 2017.  Mailing a copy of labs per her request. ______

## 2015-07-30 NOTE — Progress Notes (Signed)
Quick Note:  See previous note. Pt's wife said to wait until after his appt in May and he will discuss then. ______

## 2015-09-22 ENCOUNTER — Other Ambulatory Visit: Payer: Self-pay

## 2015-09-22 DIAGNOSIS — K7460 Unspecified cirrhosis of liver: Secondary | ICD-10-CM

## 2015-09-23 ENCOUNTER — Ambulatory Visit (INDEPENDENT_AMBULATORY_CARE_PROVIDER_SITE_OTHER): Payer: Medicare Other | Admitting: Gastroenterology

## 2015-09-23 ENCOUNTER — Encounter: Payer: Self-pay | Admitting: Gastroenterology

## 2015-09-23 VITALS — BP 119/74 | HR 65 | Temp 96.8°F | Ht 71.0 in | Wt 208.0 lb

## 2015-09-23 DIAGNOSIS — I25812 Atherosclerosis of bypass graft of coronary artery of transplanted heart without angina pectoris: Secondary | ICD-10-CM | POA: Diagnosis not present

## 2015-09-23 DIAGNOSIS — R634 Abnormal weight loss: Secondary | ICD-10-CM

## 2015-09-23 DIAGNOSIS — K7460 Unspecified cirrhosis of liver: Secondary | ICD-10-CM

## 2015-09-23 NOTE — Patient Instructions (Signed)
I will see you in 6 months.   Please call with any concerns!

## 2015-09-23 NOTE — Progress Notes (Signed)
Referring Provider: Richardean Chimera, MD Primary Care Physician:  Donzetta Sprung, MD  Primary GI: Dr. Darrick Penna   Chief Complaint  Patient presents with  . Follow-up    HPI:   [name] is a 70 y.o. male presenting today with a history of cirrhosis, with ASMA and ANA positive but negative AMA. No evidence of Wilson's disease or iron overload. ASMA and ANA could be positive in setting of NASH. Liver biopsy could be contemplated but patient and wife do not desire to pursue this. Due to weight loss and abdominal pain, CTA ordered and showed mild narrowing at origin of celiac, 50% narrowing at origin of SMA but patent, and IMA patent but severely diminutive. Recommended Vascular Surgery consultation, but the patient and wife are declining this as well.    Lost an additional 10 lbs since March. Recent EGD with Grade 1 varices, portal gastropathy, started on Nadolol. Doesn't want to eat. No energy. Concerned about ammonia level. Wife states he eats cream cheese cherry pies well. No appetite. "I don't care if I eat or not". Occasional nausea. Insomnia an issue. Chronic back, leg pain. Does not want to pursue Hep A/B vaccinations.       Past Medical History  Diagnosis Date  . COPD (chronic obstructive pulmonary disease) (HCC)   . Elevated liver function tests   . Hypertension   . Aortic stenosis     a. 2D ECHO: 01/30/2014: ef 50-55%. No RWMAs. Severe TF:TDDUK area (VTI): 0.82 cm^2. Valve area (Vmax): 0.74 cm^2. Valve area (Vmean): 0.75 Cm^2. Mod LA/RA dilation. PA peak pressure 33  . CAD (coronary artery disease)   . GERD (gastroesophageal reflux disease)   . Chronic atrial flutter (HCC)   . 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 Morbid obesity (HCC)   . Coronary atherosclerosis of artery bypass graft 2010  . Acute on chronic diastolic heart failure (HCC) 02/01/2014  . Chronic diastolic  congestive heart failure (HCC) 02/01/2014  . Epistaxis     Developed on Xarelto therapy  . Cirrhosis (HCC) 02/01/2014  . Portal hypertension (HCC) 02/01/2014  . Chronic pancreatitis (HCC) 02/01/2014    Noted on CT scan  . Shortness of breath   . Dizziness   . Heart murmur   . Asthma     uses nebulizers daily  . Dysrhythmia     fib/flutter  . Anemia     was on xalerto sept 2015 for afib - was taken off of - had to get unit of prbc  . History of blood transfusion 01/2014    "right after starting Xeralto"  . 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 Hyperlipidemia   . Pneumonia "several times:  . Type II diabetes mellitus (HCC)   . Rheumatoid arthritis(714.0)     "everywhere"  . Degenerative disc disease, cervical   . Degenerative disc disease, lumbar 2010  . Osteoarthritis     "everywhere"  . Chronic back pain     "lower back usually; upper back fior the last couple weeks"  (12/26/2014)  . ARF (acute renal failure) (HCC) 11/2014  . On home oxygen therapy     "2.5L at night & prn during the day" (12/26/2014)  . Other myositis, left thigh dx'd  12/26/2014  . Acute renal failure (HCC) 12/30/2014  . Atrial fibrillation (HCC) 12/30/2014  . 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 Hyposmolality and/or hyponatremia 12/30/2014  . CHB (complete heart block) (HCC) 12/30/2014  . Elevated troponin level, secondary to CHB acute renal failure 12/30/2014  . Complication of anesthesia     "has a hard time breathing when he wakes up"    Past Surgical History  Procedure Laterality Date  . Coronary angioplasty with stent placement  01/02/2009    s/p bare metal stent to the vein graft to the RCA 9/20. nml LV function   . Appendectomy  1950  . Coronary artery bypass graft  03/27/1997    CABG x 5 by Dr Cornelius Moras  . Anterior cervical decomp/discectomy fusion   12/2006  . Tonsillectomy and adenoidectomy  1950  . Lumbar disc surgery  2011    "put plastic wedge in"  . Esophagogastroduodenoscopy (egd) with propofol N/A 02/01/2014    Dr. Bosie Clos: small distal esophageal varices and mild portal gastropathy  . Colonoscopy with propofol N/A 02/01/2014    Sigmoid diverticulosis   . Multiple extractions with alveoloplasty N/A 03/12/2014    Procedure: MULTIPLE EXTRACTION OF TOOTH #'S 2,6,7,8,9,10,11,14,18,20,29,31 WITH ALVEOLOPLASTY AND GROSS DEBRIDMENT OF REMAINING TEETH;  Surgeon: Charlynne Pander, DDS;  Location: [facility] OR;  Service: Oral Surgery;  Laterality: N/A;  . Back surgery    . Transcatheter aortic valve replacement, transfemoral N/A 04/03/2014    Procedure: TRANSCATHETER AORTIC VALVE REPLACEMENT, TRANSFEMORAL;  Surgeon: Purcell Nails, MD;  Location: [facility] OR;  Service: Open Heart Surgery;  Laterality: N/A;  . Intraoperative transesophageal echocardiogram N/A 04/03/2014    Procedure: INTRAOPERATIVE TRANSESOPHAGEAL ECHOCARDIOGRAM;  Surgeon: Purcell Nails, MD;  Location: [facility] OR;  Service: Open Heart Surgery;  Laterality: N/A;  . Left and right heart catheterization with coronary/graft angiogram N/A 02/05/2014    Procedure: LEFT AND RIGHT HEART CATHETERIZATION WITH Isabel Caprice;  Surgeon: Peter M Swaziland, MD;  Location: [facility] CATH LAB;  Service: Cardiovascular;  Laterality: N/A;  . Posterior laminectomy / decompression cervical spine  05/2007  . Ep implantable device N/A 12/27/2014    Procedure: Pacemaker Implant;  Surgeon: Marinus Maw, MD;  Location: [facility] INVASIVE CV LAB;  Service: Cardiovascular;  Laterality: N/A;  . Insert / replace / remove pacemaker    . Esophagogastroduodenoscopy (egd) with propofol N/A 07/22/2015    Dr. Darrick Penna: grade 1 varices, portal gastropathy, Nadolol started. Repeat EGD in 1-2 years to reassess varices and possible banding   . Savory dilation N/A 07/22/2015    Procedure: SAVORY DILATION;  Surgeon: West Bali, MD;   Location: [facility] ENDO SUITE;  Service: Endoscopy;  Laterality: N/A;    Current Outpatient Prescriptions  Medication Sig Dispense Refill  . albuterol-ipratropium (COMBIVENT) 18-103 MCG/ACT inhaler Inhale 2 puffs into the lungs every 6 (six) hours as needed for wheezing or shortness of breath.     . Artificial Tear Ointment (DRY EYES OP) Apply 1 drop to eye daily as needed (dry eyes).    Marland Kitchen ascorbic acid (VITAMIN C) 500 MG tablet Take 500 mg by mouth 2 (two) times daily.     Marland Kitchen aspirin 81 MG tablet Take 1 tablet (81 mg total) by mouth daily.    . cetirizine (ZYRTEC) 10 MG tablet Take 10 mg by mouth at bedtime.    . Citalopram Hydrobromide (CELEXA PO) Take by mouth daily.    . clopidogrel (PLAVIX) 75 MG tablet Take 1 tablet (  75 mg total) by mouth daily. 90 tablet 3  . co-enzyme Q-10 30 MG capsule Take 30 mg by mouth at bedtime.     Marland Kitchen desonide (DESOWEN) 0.05 % cream Apply 1 application topically 2 (two) times daily as needed (for rash).     Marland Kitchen diltiazem (CARDIZEM CD) 120 MG 24 hr capsule Take 1 capsule (120 mg total) by mouth daily. 90 capsule 3  . DULoxetine (CYMBALTA) 30 MG capsule Take 30 mg by mouth daily.    Marland Kitchen ezetimibe (ZETIA) 10 MG tablet Take 10 mg by mouth at bedtime.     . flunisolide (NASAREL) 29 MCG/ACT (0.025%) nasal spray Place 2 sprays into the nose at bedtime as needed for rhinitis (for congestion). Dose is for each nostril.    . insulin NPH-regular Human (NOVOLIN 70/30) (70-30) 100 UNIT/ML injection Inject 15 Units into the skin 2 (two) times daily. (Patient taking differently: Inject 15-20 Units into the skin 2 (two) times daily. 40 as a base; <180 = 40 units) 10 mL 11  . ipratropium-albuterol (DUONEB) 0.5-2.5 (3) MG/3ML SOLN Take 3 mLs by nebulization every 6 (six) hours as needed (for shortness of breath).    Marland Kitchen ketoconazole (NIZORAL) 2 % cream Apply 1 application topically 2 (two) times daily as needed for irritation.     Marland Kitchen KRISTALOSE 20 G packet Take 10 g by mouth 2 (two) times daily.      Marland Kitchen loratadine (CLARITIN) 10 MG tablet Take 10 mg by mouth every morning.     . Multiple Vitamin (MULTIVITAMIN) tablet Take 1 tablet by mouth daily.      . nadolol (CORGARD) 20 MG tablet Take 1 tablet (20 mg total) by mouth daily. 30 tablet 11  . oxyCODONE-acetaminophen (PERCOCET/ROXICET) 5-325 MG per tablet Take 1-2 tablets by mouth every 4 (four) hours as needed for moderate pain. (Patient taking differently: Take 1 tablet by mouth every 4 (four) hours as needed for moderate pain. ) 40 tablet 0  . pantoprazole (PROTONIX) 40 MG tablet 1 po 30 mins prior to first meal 30 tablet 11  . potassium chloride SA (K-DUR,KLOR-CON) 20 MEQ tablet Take 1 tablet (20 mEq total) by mouth 2 (two) times daily. 180 tablet 3  . predniSONE (DELTASONE) 5 MG tablet Take 5 mg by mouth daily with breakfast. Reported on 09/23/2015    . rifaximin (XIFAXAN) 550 MG TABS tablet Take 550 mg by mouth 2 (two) times daily.    Marland Kitchen torsemide (DEMADEX) 20 MG tablet Take 20 mg by mouth 2 (two) times daily.    . CRESTOR 5 MG tablet Take 5 mg by mouth every other day. 3 day weekly     No current facility-administered medications for this visit.    Allergies as of 09/23/2015 - Review Complete 09/23/2015  Allergen Reaction Noted  . Xarelto [rivaroxaban]  06/17/2015    Family History  Problem Relation Age of Onset  . Hypertension Mother   . Cancer Father   . Stroke Brother   . Liver cancer Paternal Grandfather   . Rheum arthritis Maternal Aunt   . Asthma Child   . Emphysema Brother     smoked  . Colon cancer Neg Hx     Social History   Social History  . Marital Status: Married    Spouse Name: N/A  . Number of Children: N/A  . Years of Education: N/A   Occupational History  . Retired Special educational needs teacher    Social History Main Topics  . Smoking status: Former  Smoker -- 3.00 packs/day for 35 years    Types: Cigarettes    Start date: 09/15/1961    Quit date: 11/23/1996  . Smokeless tobacco: Current User    Types: Chew  .  Alcohol Use: No  . Drug Use: No  . Sexual Activity: Yes   Other Topics Concern  . None   Social History Narrative   Married, retired.     Review of Systems: As mentioned in HPI.   Physical Exam: BP 119/74 mmHg  Pulse 65  Temp(Src) 96.8 F (36 C)  Ht 5\' 11"  (1.803 m)  Wt 208 lb (94.348 kg)  BMI 29.02 kg/m2 General:   Alert and oriented. No distress noted. Chronically ill appearing.  Head:  Normocephalic and atraumatic. Eyes:  Conjuctiva clear without scleral icterus. Mouth:  Oral mucosa pink and moist.  Abdomen:  +BS, soft, non-tender and non-distended. No rebound or guarding. No HSM or masses noted. Msk:  Kyphosis. Walks slowly.  Extremities:  Without edema. Neurologic:  Alert and  oriented x4;  grossly normal neurologically. Psych:  Alert and cooperative. Flat affect.

## 2015-09-30 ENCOUNTER — Encounter: Payer: Self-pay | Admitting: Gastroenterology

## 2015-09-30 ENCOUNTER — Telehealth: Payer: Self-pay | Admitting: Internal Medicine

## 2015-09-30 ENCOUNTER — Telehealth: Payer: Self-pay | Admitting: Gastroenterology

## 2015-09-30 ENCOUNTER — Ambulatory Visit (INDEPENDENT_AMBULATORY_CARE_PROVIDER_SITE_OTHER): Payer: Medicare Other | Admitting: *Deleted

## 2015-09-30 ENCOUNTER — Telehealth: Payer: Self-pay | Admitting: Cardiology

## 2015-09-30 DIAGNOSIS — I442 Atrioventricular block, complete: Secondary | ICD-10-CM

## 2015-09-30 NOTE — Telephone Encounter (Signed)
New Message  Pt wife called states that she is unable to send a signal because the device to send it isnt working properly. Please call back

## 2015-09-30 NOTE — Telephone Encounter (Signed)
Please nic US abdomen to be done in Sept 2017. HCC screening. History of cirrhosis.

## 2015-09-30 NOTE — Telephone Encounter (Signed)
Confirmed remote transmission w/ pt wife.   

## 2015-09-30 NOTE — Telephone Encounter (Signed)
LMOVM for pt wife to return call.  

## 2015-09-30 NOTE — Assessment & Plan Note (Signed)
Likely secondary to NASH. Persistent weight loss noted. Patient does not desire to pursue liver biopsy, hepatitis vaccinations, or Vascular surgery referral for evaluation of possible chronic mesenteric ischemia. I spent a significant amount of time discussing the risks involved with declining aspects of care. Patient is well aware and appears tired, possibly depressed but denies this, and wife is quite supportive of him. EGD up-to-date and needs surveillance in 1-2 years.   US abdomen in Sept 2017: we will place on recall Referral to IR Vascular specialists if agreeable in the future due to CTA findings Continue Nadolol Return in 6 months Limited options to offer patient as he does not desire aggressive treatment or evaluation.

## 2015-10-01 ENCOUNTER — Emergency Department (HOSPITAL_COMMUNITY): Payer: Medicare Other

## 2015-10-01 ENCOUNTER — Observation Stay (HOSPITAL_COMMUNITY)
Admission: EM | Admit: 2015-10-01 | Discharge: 2015-10-03 | Disposition: A | Discharge status: Home / Self Care | Payer: Medicare Other | Attending: Internal Medicine | Admitting: Internal Medicine

## 2015-10-01 ENCOUNTER — Encounter (HOSPITAL_COMMUNITY): Payer: Self-pay | Admitting: Emergency Medicine

## 2015-10-01 DIAGNOSIS — S76911A Strain of unspecified muscles, fascia and tendons at thigh level, right thigh, initial encounter: Secondary | ICD-10-CM | POA: Insufficient documentation

## 2015-10-01 DIAGNOSIS — I951 Orthostatic hypotension: Secondary | ICD-10-CM | POA: Diagnosis present

## 2015-10-01 DIAGNOSIS — Z87891 Personal history of nicotine dependence: Secondary | ICD-10-CM | POA: Diagnosis not present

## 2015-10-01 DIAGNOSIS — I2510 Atherosclerotic heart disease of native coronary artery without angina pectoris: Secondary | ICD-10-CM | POA: Insufficient documentation

## 2015-10-01 DIAGNOSIS — Z6827 Body mass index (BMI) 27.0-27.9, adult: Secondary | ICD-10-CM | POA: Diagnosis not present

## 2015-10-01 DIAGNOSIS — R55 Syncope and collapse: Secondary | ICD-10-CM | POA: Diagnosis present

## 2015-10-01 DIAGNOSIS — I4892 Unspecified atrial flutter: Secondary | ICD-10-CM | POA: Insufficient documentation

## 2015-10-01 DIAGNOSIS — Y92481 Parking lot as the place of occurrence of the external cause: Secondary | ICD-10-CM | POA: Diagnosis not present

## 2015-10-01 DIAGNOSIS — Z79891 Long term (current) use of opiate analgesic: Secondary | ICD-10-CM | POA: Insufficient documentation

## 2015-10-01 DIAGNOSIS — D696 Thrombocytopenia, unspecified: Secondary | ICD-10-CM | POA: Diagnosis present

## 2015-10-01 DIAGNOSIS — Z950 Presence of cardiac pacemaker: Secondary | ICD-10-CM | POA: Diagnosis present

## 2015-10-01 DIAGNOSIS — Z79899 Other long term (current) drug therapy: Secondary | ICD-10-CM | POA: Insufficient documentation

## 2015-10-01 DIAGNOSIS — S73101A Unspecified sprain of right hip, initial encounter: Secondary | ICD-10-CM

## 2015-10-01 DIAGNOSIS — Y999 Unspecified external cause status: Secondary | ICD-10-CM | POA: Diagnosis not present

## 2015-10-01 DIAGNOSIS — Z952 Presence of prosthetic heart valve: Secondary | ICD-10-CM

## 2015-10-01 DIAGNOSIS — J9610 Chronic respiratory failure, unspecified whether with hypoxia or hypercapnia: Secondary | ICD-10-CM | POA: Diagnosis present

## 2015-10-01 DIAGNOSIS — J45909 Unspecified asthma, uncomplicated: Secondary | ICD-10-CM | POA: Diagnosis not present

## 2015-10-01 DIAGNOSIS — S161XXA Strain of muscle, fascia and tendon at neck level, initial encounter: Secondary | ICD-10-CM | POA: Diagnosis not present

## 2015-10-01 DIAGNOSIS — I110 Hypertensive heart disease with heart failure: Secondary | ICD-10-CM | POA: Diagnosis not present

## 2015-10-01 DIAGNOSIS — J449 Chronic obstructive pulmonary disease, unspecified: Secondary | ICD-10-CM | POA: Diagnosis present

## 2015-10-01 DIAGNOSIS — I10 Essential (primary) hypertension: Secondary | ICD-10-CM | POA: Diagnosis present

## 2015-10-01 DIAGNOSIS — Y939 Activity, unspecified: Secondary | ICD-10-CM | POA: Diagnosis not present

## 2015-10-01 DIAGNOSIS — Z951 Presence of aortocoronary bypass graft: Secondary | ICD-10-CM

## 2015-10-01 DIAGNOSIS — I25810 Atherosclerosis of coronary artery bypass graft(s) without angina pectoris: Secondary | ICD-10-CM | POA: Diagnosis present

## 2015-10-01 DIAGNOSIS — S0081XA Abrasion of other part of head, initial encounter: Secondary | ICD-10-CM | POA: Insufficient documentation

## 2015-10-01 DIAGNOSIS — S00211A Abrasion of right eyelid and periocular area, initial encounter: Secondary | ICD-10-CM | POA: Diagnosis not present

## 2015-10-01 DIAGNOSIS — W19XXXA Unspecified fall, initial encounter: Secondary | ICD-10-CM | POA: Insufficient documentation

## 2015-10-01 DIAGNOSIS — Z794 Long term (current) use of insulin: Secondary | ICD-10-CM | POA: Insufficient documentation

## 2015-10-01 DIAGNOSIS — I4891 Unspecified atrial fibrillation: Secondary | ICD-10-CM | POA: Diagnosis present

## 2015-10-01 DIAGNOSIS — T148XXA Other injury of unspecified body region, initial encounter: Secondary | ICD-10-CM

## 2015-10-01 DIAGNOSIS — S060X1A Concussion with loss of consciousness of 30 minutes or less, initial encounter: Secondary | ICD-10-CM | POA: Diagnosis not present

## 2015-10-01 DIAGNOSIS — R52 Pain, unspecified: Secondary | ICD-10-CM

## 2015-10-01 DIAGNOSIS — K7460 Unspecified cirrhosis of liver: Secondary | ICD-10-CM | POA: Diagnosis present

## 2015-10-01 DIAGNOSIS — I5032 Chronic diastolic (congestive) heart failure: Secondary | ICD-10-CM | POA: Diagnosis present

## 2015-10-01 LAB — BASIC METABOLIC PANEL
Anion gap: 6 (ref 5–15)
BUN: 15 mg/dL (ref 6–20)
CHLORIDE: 100 mmol/L — AB (ref 101–111)
CO2: 29 mmol/L (ref 22–32)
CREATININE: 1 mg/dL (ref 0.61–1.24)
Calcium: 8.7 mg/dL — ABNORMAL LOW (ref 8.9–10.3)
Glucose, Bld: 96 mg/dL (ref 65–99)
POTASSIUM: 3.6 mmol/L (ref 3.5–5.1)
SODIUM: 135 mmol/L (ref 135–145)

## 2015-10-01 LAB — CBC
HEMATOCRIT: 37.8 % — AB (ref 39.0–52.0)
Hemoglobin: 12.5 g/dL — ABNORMAL LOW (ref 13.0–17.0)
MCH: 32.1 pg (ref 26.0–34.0)
MCHC: 33.1 g/dL (ref 30.0–36.0)
MCV: 96.9 fL (ref 78.0–100.0)
PLATELETS: 77 10*3/uL — AB (ref 150–400)
RBC: 3.9 MIL/uL — AB (ref 4.22–5.81)
RDW: 16.2 % — AB (ref 11.5–15.5)
WBC: 4.8 10*3/uL (ref 4.0–10.5)

## 2015-10-01 LAB — CBG MONITORING, ED: GLUCOSE-CAPILLARY: 101 mg/dL — AB (ref 65–99)

## 2015-10-01 LAB — TROPONIN I: Troponin I: 0.04 ng/mL — ABNORMAL HIGH (ref <0.031)

## 2015-10-01 NOTE — ED Notes (Addendum)
Pt has skin tear to the rt hand/wrist, rt elbow and abrasion to the rt eyebrow. Pt is alert and oriented to time, place, and person. Per ems pt's blood sugar was 110.

## 2015-10-01 NOTE — Progress Notes (Signed)
cc'ed to pcp °

## 2015-10-01 NOTE — Progress Notes (Signed)
Remote pacemaker transmission.   

## 2015-10-01 NOTE — Telephone Encounter (Signed)
Noted pt place on recall list

## 2015-10-01 NOTE — Telephone Encounter (Signed)
LMOVM informing pt that we received his remote.

## 2015-10-01 NOTE — ED Notes (Signed)
Per pt he has been dizzy on and off today and had syncopal episode in [employer] parking lot. Pt has laceration to the rt hand and rt side of head.

## 2015-10-01 NOTE — ED Provider Notes (Signed)
CSN: [id number]     Arrival date & time 10/01/15  2051 History  By signing my name below, I, [name], attest that this documentation has been prepared under the direction and in the presence of Dennis Rhine, MD. Electronically Signed: [name], ED Scribe. 10/01/2015. 11:34 PM.   Chief Complaint  Patient presents with  . Loss of Consciousness      Patient is a 70 y.o. male presenting with syncope. The history is provided by the patient. No language interpreter was used.  Loss of Consciousness Episode history:  Single Most recent episode:  Today Timing:  Unable to specify Progression:  Improving Chronicity:  New Context: normal activity   Witnessed: yes   Relieved by:  None tried Worsened by:  Nothing tried Ineffective treatments:  None tried Associated symptoms: dizziness   Associated symptoms: no chest pain, no headaches, no shortness of breath and no weakness   Risk factors: coronary artery disease   Risk factors: no seizures    HPI Comments: [name] is a 70 y.o. male with a PMHx of cardiac pacemaker, CAD, aortic stenosis, HTN, DM, who presents to the Emergency Department complaining of LOC onset PTA. Pt notes that he had intermittent dizziness that he describes as feeling off balanced and weakness prior to his syncopal episode in a parking lot. Pt denies remembering hitting the ground. Pt denies feeling off balanced currently. Pt reports that prior to the placement of his pacemaker he had similar symptoms at his PCP office and that prompted the placement of his pacemaker. Denies having syncopal episodes in the past. Denies PMHx of seizures. He states that he is having associated symptoms of abrasions to right hand and right side of head and right thigh pain. Pt denies pain anywhere else at this time. He states that he has not tried any medications for the relief for his symptoms. He denies CP, SOB, HA, abdominal pain, dental pain, neck pain, vision change, double  vision, and any other symptoms.  Past Medical History  Diagnosis Date  . COPD (chronic obstructive pulmonary disease) (HCC)   . Elevated liver function tests   . Hypertension   . Aortic stenosis     a. 2D ECHO: 01/30/2014: ef 50-55%. No RWMAs. Severe TS:VXBLT area (VTI): 0.82 cm^2. Valve area (Vmax): 0.74 cm^2. Valve area (Vmean): 0.75 Cm^2. Mod LA/RA dilation. PA peak pressure 33  . CAD (coronary artery disease)   . GERD (gastroesophageal reflux disease)   . Chronic atrial flutter (HCC)   . 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 Morbid obesity (HCC)   . Coronary atherosclerosis of artery bypass graft 2010  . Acute on chronic diastolic heart failure (HCC) 02/01/2014  . Chronic diastolic congestive heart failure (HCC) 02/01/2014  . Epistaxis     Developed on Xarelto therapy  . Cirrhosis (HCC) 02/01/2014  . Portal hypertension (HCC) 02/01/2014  . Chronic pancreatitis (HCC) 02/01/2014    Noted on CT scan  . Shortness of breath   . Dizziness   . Heart murmur   . Asthma     uses nebulizers daily  . Dysrhythmia     fib/flutter  . Anemia     was on xalerto sept 2015 for afib - was taken off of - had to get unit of prbc  . History of blood transfusion 01/2014    "right  after starting Xeralto"  . 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 Hyperlipidemia   . Pneumonia "several times:  . Type II diabetes mellitus (HCC)   . Rheumatoid arthritis(714.0)     "everywhere"  . Degenerative disc disease, cervical   . Degenerative disc disease, lumbar 2010  . Osteoarthritis     "everywhere"  . Chronic back pain     "lower back usually; upper back fior the last couple weeks"  (12/26/2014)  . ARF (acute renal failure) (HCC) 11/2014  . On home oxygen therapy     "2.5L at night & prn during the  day" (12/26/2014)  . Other myositis, left thigh dx'd 12/26/2014  . Acute renal failure (HCC) 12/30/2014  . Atrial fibrillation (HCC) 12/30/2014  . 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 Hyposmolality and/or hyponatremia 12/30/2014  . CHB (complete heart block) (HCC) 12/30/2014  . Elevated troponin level, secondary to CHB acute renal failure 12/30/2014  . Complication of anesthesia     "has a hard time breathing when he wakes up"   Past Surgical History  Procedure Laterality Date  . Coronary angioplasty with stent placement  01/02/2009    s/p bare metal stent to the vein graft to the RCA 9/20. nml LV function   . Appendectomy  1950  . Coronary artery bypass graft  03/27/1997    CABG x 5 by Dr Cornelius Moras  . Anterior cervical decomp/discectomy fusion  12/2006  . Tonsillectomy and adenoidectomy  1950  . Lumbar disc surgery  2011    "put plastic wedge in"  . Esophagogastroduodenoscopy (egd) with propofol N/A 02/01/2014    Dr. Bosie Clos: small distal esophageal varices and mild portal gastropathy  . Colonoscopy with propofol N/A 02/01/2014    Sigmoid diverticulosis   . Multiple extractions with alveoloplasty N/A 03/12/2014    Procedure: MULTIPLE EXTRACTION OF TOOTH #'S 2,6,7,8,9,10,11,14,18,20,29,31 WITH ALVEOLOPLASTY AND GROSS DEBRIDMENT OF REMAINING TEETH;  Surgeon: Charlynne Pander, DDS;  Location: [facility] OR;  Service: Oral Surgery;  Laterality: N/A;  . Back surgery    . Transcatheter aortic valve replacement, transfemoral N/A 04/03/2014    Procedure: TRANSCATHETER AORTIC VALVE REPLACEMENT, TRANSFEMORAL;  Surgeon: Purcell Nails, MD;  Location: [facility] OR;  Service: Open Heart Surgery;  Laterality: N/A;  . Intraoperative transesophageal echocardiogram N/A 04/03/2014    Procedure: INTRAOPERATIVE TRANSESOPHAGEAL ECHOCARDIOGRAM;  Surgeon: Purcell Nails, MD;  Location: [facility] OR;  Service: Open Heart Surgery;  Laterality: N/A;  . Left and right heart catheterization with  coronary/graft angiogram N/A 02/05/2014    Procedure: LEFT AND RIGHT HEART CATHETERIZATION WITH Isabel Caprice;  Surgeon: Peter M Swaziland, MD;  Location: [facility] CATH LAB;  Service: Cardiovascular;  Laterality: N/A;  . Posterior laminectomy / decompression cervical spine  05/2007  . Ep implantable device N/A 12/27/2014    Procedure: Pacemaker Implant;  Surgeon: Marinus Maw, MD;  Location: [facility] INVASIVE CV LAB;  Service: Cardiovascular;  Laterality: N/A;  . Insert / replace / remove pacemaker    . Esophagogastroduodenoscopy (egd) with propofol N/A 07/22/2015    Dr. Darrick Penna: grade 1 varices, portal gastropathy, Nadolol started. Repeat EGD in 1-2 years to reassess varices and possible banding   . Savory dilation N/A 07/22/2015    Procedure: SAVORY DILATION;  Surgeon: West Bali, MD;  Location: [facility] ENDO SUITE;  Service: Endoscopy;  Laterality: N/A;   Family History  Problem Relation Age of Onset  . Hypertension Mother   . Cancer Father   . Stroke Brother   . Liver cancer Paternal Grandfather   . Rheum arthritis Maternal Aunt   . Asthma Child   . Emphysema Brother     smoked  . Colon cancer Neg Hx    Social History  Substance Use Topics  . Smoking status: Former Smoker -- 3.00 packs/day for 35 years    Types: Cigarettes    Start date: 09/15/1961    Quit date: 11/23/1996  . Smokeless tobacco: Current User    Types: Chew  . Alcohol Use: No    Review of Systems  HENT: Negative for dental problem.   Eyes: Negative for visual disturbance.  Respiratory: Negative for shortness of breath.   Cardiovascular: Positive for syncope. Negative for chest pain.  Gastrointestinal: Negative for abdominal pain.  Musculoskeletal: Positive for myalgias (right thigh ). Negative for back pain and neck pain.  Skin: Positive for wound.  Neurological: Positive for dizziness and syncope. Negative for weakness and headaches.  All other systems reviewed and are negative.     Allergies   Xarelto  Home Medications   Prior to Admission medications   Medication Sig Start Date End Date Taking? Authorizing Provider  ascorbic acid (VITAMIN C) 500 MG tablet Take 500 mg by mouth 2 (two) times daily.    Yes Historical Provider, MD  cetirizine (ZYRTEC) 10 MG tablet Take 10 mg by mouth at bedtime.   Yes Historical Provider, MD  clopidogrel (PLAVIX) 75 MG tablet Take 1 tablet (75 mg total) by mouth daily. 03/10/15  Yes Laqueta Linden, MD  co-enzyme Q-10 30 MG capsule Take 30 mg by mouth at bedtime.    Yes Historical Provider, MD  CRESTOR 5 MG tablet Take 5 mg by mouth every other day. 3 day weekly 04/11/15  Yes Historical Provider, MD  diltiazem (CARDIZEM CD) 120 MG 24 hr capsule Take 1 capsule (120 mg total) by mouth daily. 11/12/14  Yes Laqueta Linden, MD  DULoxetine (CYMBALTA) 60 MG capsule Take 60 mg by mouth daily. 09/10/15  Yes Historical Provider, MD  ezetimibe (ZETIA) 10 MG tablet Take 10 mg by mouth at bedtime.    Yes Historical Provider, MD  insulin NPH-regular Human (NOVOLIN 70/30) (70-30) 100 UNIT/ML injection Inject 15 Units into the skin 2 (two) times daily. Patient taking differently: Inject 15-20 Units into the skin 2 (two) times daily. 40 as a base; <180 = 40 units 11/04/14  Yes Erick Blinks, MD  loratadine (CLARITIN) 10 MG tablet Take 10 mg by mouth every morning.    Yes Historical Provider, MD  Multiple Vitamin (MULTIVITAMIN) tablet Take 1 tablet by mouth daily.     Yes Historical Provider, MD  nadolol (CORGARD) 20 MG tablet Take 1 tablet (20 mg total) by mouth daily. 07/22/15  Yes West Bali, MD  oxyCODONE-acetaminophen (PERCOCET/ROXICET) 5-325 MG per tablet Take 1-2 tablets by mouth every 4 (four) hours as needed for moderate pain. Patient taking differently: Take 1 tablet by mouth every 4 (four) hours as needed for moderate pain.  03/12/14  Yes Charlynne Pander, DDS  potassium chloride SA (K-DUR,KLOR-CON) 20 MEQ tablet Take 1 tablet (20 mEq total) by mouth  2 (two) times daily. 05/01/15  Yes Laqueta Linden, MD  predniSONE (DELTASONE) 5 MG tablet Take 5 mg by mouth daily with breakfast. Reported on 09/23/2015   Yes Historical Provider, MD  rifaximin (XIFAXAN) 550 MG TABS tablet  Take 550 mg by mouth 2 (two) times daily.   Yes Historical Provider, MD  torsemide (DEMADEX) 20 MG tablet Take 20 mg by mouth 2 (two) times daily.   Yes Historical Provider, MD  albuterol-ipratropium (COMBIVENT) 18-103 MCG/ACT inhaler Inhale 2 puffs into the lungs every 6 (six) hours as needed for wheezing or shortness of breath.     Historical Provider, MD  Artificial Tear Ointment (DRY EYES OP) Apply 1 drop to eye daily as needed (dry eyes).    Historical Provider, MD  aspirin 81 MG tablet Take 1 tablet (81 mg total) by mouth daily. Patient not taking: Reported on 10/01/2015 02/07/14   Joline Salt Barrett, PA-C  desonide (DESOWEN) 0.05 % cream Apply 1 application topically 2 (two) times daily as needed (for rash).     Historical Provider, MD  flunisolide (NASAREL) 29 MCG/ACT (0.025%) nasal spray Place 2 sprays into the nose at bedtime as needed for rhinitis (for congestion). Dose is for each nostril.    Historical Provider, MD  ipratropium-albuterol (DUONEB) 0.5-2.5 (3) MG/3ML SOLN Take 3 mLs by nebulization every 6 (six) hours as needed (for shortness of breath).    Historical Provider, MD  ketoconazole (NIZORAL) 2 % cream Apply 1 application topically 2 (two) times daily as needed for irritation.     Historical Provider, MD  KRISTALOSE 20 G packet Take 10 g by mouth 2 (two) times daily as needed (diarrhea).  12/15/14   Historical Provider, MD  pantoprazole (PROTONIX) 40 MG tablet 1 po 30 mins prior to first meal Patient taking differently: Take 40 mg by mouth daily as needed (acid reflux).  07/22/15   West Bali, MD   BP 120/56 mmHg  Pulse 61  Temp(Src) 97.9 F (36.6 C) (Oral)  Resp 18  Ht 5\' 11"  (1.803 m)  Wt 200 lb (90.719 kg)  BMI 27.91 kg/m2  SpO2 95% Physical  Exam  CONSTITUTIONAL: Well developed/well nourished HEAD: right peri-orbital bruising. Scattered abrasions.  EYES: EOMI/PERRL ENMT: Mucous membranes moist, abrasions and bruising to right face. No step-offs or crepitus. No septal hematoma. No oral injury.  NECK: supple no meningeal signs SPINE/BACK:entire spine nontender CV: S1/S2 noted, no murmurs/rubs/gallops noted LUNGS: Lungs are clear to auscultation bilaterally, no apparent distress ABDOMEN: soft, nontender, no rebound or guarding, bowel sounds noted throughout abdomen GU:no cva tenderness NEURO: Pt is awake/alert/appropriate, moves all extremitiesx4.  No facial droop.   EXTREMITIES: pulses normal/equal, full ROM. Tenderness to right thigh. No deformity, All other extremities/joints palpated/ranged and nontender SKIN: warm, color normal. Scattered skin tears to RUE PSYCH: no abnormalities of mood noted, alert and oriented to situation   ED Course  Procedures  DIAGNOSTIC STUDIES: Oxygen Saturation is 95% on RA, adequate by my interpretation.    COORDINATION OF CARE: 11:31 PM Discussed treatment plan with pt at bedside which includes labs, UA, EKG, CXR, right femur xray, CT head, CT c-spine, and pt agreed to plan.  2:16 AM Pt with significant dizziness and orthostatic hypotension upon standing His pacemaker interrogation showed no arrythmias per report Will admit for orthostatic hypotension as he is high risk for falls and injury  D/w dr for admission for rehydration and medication adjustment (he is on multiple BP meds)  Labs Review Labs Reviewed  BASIC METABOLIC PANEL - Abnormal; Notable for the following:    Chloride 100 (*)    Calcium 8.7 (*)    All other components within normal limits  CBC - Abnormal; Notable for the following:  RBC 3.90 (*)    Hemoglobin 12.5 (*)    HCT 37.8 (*)    RDW 16.2 (*)    Platelets 77 (*)    All other components within normal limits  TROPONIN I - Abnormal; Notable for the  following:    Troponin I 0.04 (*)    All other components within normal limits  CBG MONITORING, ED - Abnormal; Notable for the following:    Glucose-Capillary 101 (*)    All other components within normal limits  CBC  COMPREHENSIVE METABOLIC PANEL  TROPONIN I  TROPONIN I  TROPONIN I    Imaging Review Dg Chest 1 View  10/02/2015  CLINICAL DATA:  Trip and fall in a parking lot this evening. Anterior chest pain. EXAM: CHEST 1 VIEW COMPARISON:  02/28/2015 FINDINGS: Post median sternotomy and CABG and aortic valve rib. Single lead pacemaker remains in place with lead projecting over the right ventricle. Heart is normal in size. Chronic interstitial coarsening. No pneumothorax. No large pleural effusion or focal airspace disease. Remote right rib fractures are again seen. No acute osseous abnormality. Postsurgical change in the cervical spine, partially included. IMPRESSION: No acute process. Electronically Signed   By: Rubye Oaks M.D.   On: 10/02/2015 00:04   Ct Head Wo Contrast  10/02/2015  CLINICAL DATA:  Status post fall at [facility] parking lot, with knot at the forehead. Concern for head or cervical spine injury. Initial encounter. EXAM: CT HEAD WITHOUT CONTRAST CT CERVICAL SPINE WITHOUT CONTRAST TECHNIQUE: Multidetector CT imaging of the head and cervical spine was performed following the standard protocol without intravenous contrast. Multiplanar CT image reconstructions of the cervical spine were also generated. COMPARISON:  CT of the head performed 11/13/2014, and MRI of the brain performed 10/29/2014. CT of the neck performed 02/20/2014 FINDINGS: CT HEAD FINDINGS There is no evidence of acute infarction, mass lesion, or intra- or extra-axial hemorrhage on CT. Prominence of the ventricles and sulci reflects mild cortical volume loss. Mild periventricular white matter change likely reflects small vessel ischemic microangiopathy. A chronic lacunar infarct is noted at the right  pons. The fourth ventricle is within normal limits. The basal ganglia are unremarkable in appearance. The cerebral hemispheres demonstrate grossly normal gray-white differentiation. No mass effect or midline shift is seen. There is no evidence of fracture; visualized osseous structures are unremarkable in appearance. The orbits are within normal limits. There appears to be a small amount of blood within the right maxillary sinus. The remaining paranasal sinuses and mastoid air cells are well-aerated. Soft tissue swelling is noted overlying the right frontal calvarium. CT CERVICAL SPINE FINDINGS There is no evidence of fracture or subluxation. The patient is status post anterior cervical spinal fusion at C3-C4, and posterior cervical spinal fusion at C7-T1. Small anterior and posterior disc osteophyte complexes are seen along the cervical spine. Vertebral bodies demonstrate normal height and alignment. Prevertebral soft tissues are within normal limits. The visualized portions of the thyroid gland are unremarkable in appearance. The visualized lung apices are clear. Prominent calcification is seen at the carotid bifurcations bilaterally, more prominent on the left, with likely moderate luminal narrowing bilaterally. IMPRESSION: 1. No evidence of traumatic intracranial injury or fracture. 2. Small amount of blood within the right maxillary sinus, without definite evidence of an associated fracture on reconstructions. 3. No evidence of fracture or subluxation along the cervical spine. 4. Soft tissue swelling overlying the right frontal calvarium. 5. Mild cortical volume loss and scattered small vessel ischemic microangiopathy.  6. Chronic lacunar infarct at the right pons. 7. Status post anterior cervical spinal fusion at C3-C4, and posterior cervical spinal fusion at C7-T1. Mild underlying degenerative change noted. 8. Prominent calcification at the carotid bifurcations bilaterally, more prominent on the left, with  likely moderate luminal narrowing bilaterally. Carotid ultrasound is recommended for further evaluation, when and as deemed clinically appropriate. Electronically Signed   By: Roanna Raider M.D.   On: 10/02/2015 00:04   Ct Cervical Spine Wo Contrast  10/02/2015  CLINICAL DATA:  Status post fall at [facility] parking lot, with knot at the forehead. Concern for head or cervical spine injury. Initial encounter. EXAM: CT HEAD WITHOUT CONTRAST CT CERVICAL SPINE WITHOUT CONTRAST TECHNIQUE: Multidetector CT imaging of the head and cervical spine was performed following the standard protocol without intravenous contrast. Multiplanar CT image reconstructions of the cervical spine were also generated. COMPARISON:  CT of the head performed 11/13/2014, and MRI of the brain performed 10/29/2014. CT of the neck performed 02/20/2014 FINDINGS: CT HEAD FINDINGS There is no evidence of acute infarction, mass lesion, or intra- or extra-axial hemorrhage on CT. Prominence of the ventricles and sulci reflects mild cortical volume loss. Mild periventricular white matter change likely reflects small vessel ischemic microangiopathy. A chronic lacunar infarct is noted at the right pons. The fourth ventricle is within normal limits. The basal ganglia are unremarkable in appearance. The cerebral hemispheres demonstrate grossly normal gray-white differentiation. No mass effect or midline shift is seen. There is no evidence of fracture; visualized osseous structures are unremarkable in appearance. The orbits are within normal limits. There appears to be a small amount of blood within the right maxillary sinus. The remaining paranasal sinuses and mastoid air cells are well-aerated. Soft tissue swelling is noted overlying the right frontal calvarium. CT CERVICAL SPINE FINDINGS There is no evidence of fracture or subluxation. The patient is status post anterior cervical spinal fusion at C3-C4, and posterior cervical spinal fusion at  C7-T1. Small anterior and posterior disc osteophyte complexes are seen along the cervical spine. Vertebral bodies demonstrate normal height and alignment. Prevertebral soft tissues are within normal limits. The visualized portions of the thyroid gland are unremarkable in appearance. The visualized lung apices are clear. Prominent calcification is seen at the carotid bifurcations bilaterally, more prominent on the left, with likely moderate luminal narrowing bilaterally. IMPRESSION: 1. No evidence of traumatic intracranial injury or fracture. 2. Small amount of blood within the right maxillary sinus, without definite evidence of an associated fracture on reconstructions. 3. No evidence of fracture or subluxation along the cervical spine. 4. Soft tissue swelling overlying the right frontal calvarium. 5. Mild cortical volume loss and scattered small vessel ischemic microangiopathy. 6. Chronic lacunar infarct at the right pons. 7. Status post anterior cervical spinal fusion at C3-C4, and posterior cervical spinal fusion at C7-T1. Mild underlying degenerative change noted. 8. Prominent calcification at the carotid bifurcations bilaterally, more prominent on the left, with likely moderate luminal narrowing bilaterally. Carotid ultrasound is recommended for further evaluation, when and as deemed clinically appropriate. Electronically Signed   By: Roanna Raider M.D.   On: 10/02/2015 00:04   Dg Femur, Min 2 Views Right  10/02/2015  CLINICAL DATA:  Trip and fall in a parking lot this evening. Mid right thigh pain. EXAM: RIGHT FEMUR 2 VIEWS COMPARISON:  None. FINDINGS: There is no evidence of fracture or other focal bone lesions. Hip and knee alignment is maintained. Vascular calcifications are seen. Surgical clips in the medial  soft tissues. IMPRESSION: No right femur fracture. Electronically Signed   By: Rubye Oaks M.D.   On: 10/02/2015 00:02   I have personally reviewed and evaluated these images and lab results  as part of my medical decision-making.   EKG Interpretation   Date/Time:  Wednesday Oct 01 2015 20:57:39 EDT Ventricular Rate:  61 PR Interval:  70 QRS Duration: 184 QT Interval:  487 QTC Calculation: 491 R Axis:   -68 Text Interpretation:  Ventricular-paced rhythm No further analysis  attempted due to paced rhythm Confirmed by Fayrene Fearing  MD, MARK ([id number]) on  10/01/2015 9:53:27 PM      MDM   Final diagnoses:  Orthostatic hypotension  Concussion, with loss of consciousness of 30 minutes or less, initial encounter  Cervical strain, initial encounter  Abrasion  Sprain of thigh, right, initial encounter    Nursing notes including past medical history and social history reviewed and considered in documentation xrays/imaging reviewed by myself and considered during evaluation Labs/vital reviewed myself and considered during evaluation    I personally performed the services described in this documentation, which was scribed in my presence. The recorded information has been reviewed and is accurate.        Dennis Rhine, MD 10/02/15 [phone]

## 2015-10-02 ENCOUNTER — Encounter (HOSPITAL_COMMUNITY): Payer: Self-pay | Admitting: *Deleted

## 2015-10-02 DIAGNOSIS — R55 Syncope and collapse: Secondary | ICD-10-CM | POA: Diagnosis not present

## 2015-10-02 DIAGNOSIS — I951 Orthostatic hypotension: Secondary | ICD-10-CM | POA: Diagnosis present

## 2015-10-02 DIAGNOSIS — S060X1A Concussion with loss of consciousness of 30 minutes or less, initial encounter: Secondary | ICD-10-CM | POA: Diagnosis not present

## 2015-10-02 LAB — COMPREHENSIVE METABOLIC PANEL
ALBUMIN: 2.3 g/dL — AB (ref 3.5–5.0)
ALK PHOS: 130 U/L — AB (ref 38–126)
ALT: 56 U/L (ref 17–63)
ANION GAP: 5 (ref 5–15)
AST: 65 U/L — ABNORMAL HIGH (ref 15–41)
BILIRUBIN TOTAL: 1.4 mg/dL — AB (ref 0.3–1.2)
BUN: 16 mg/dL (ref 6–20)
CALCIUM: 8.3 mg/dL — AB (ref 8.9–10.3)
CO2: 27 mmol/L (ref 22–32)
CREATININE: 0.91 mg/dL (ref 0.61–1.24)
Chloride: 102 mmol/L (ref 101–111)
GFR calc non Af Amer: >60 mL/min (ref >60)
GLUCOSE: 172 mg/dL — AB (ref 65–99)
Potassium: 3.5 mmol/L (ref 3.5–5.1)
Sodium: 134 mmol/L — ABNORMAL LOW (ref 135–145)
TOTAL PROTEIN: 6.1 g/dL — AB (ref 6.5–8.1)

## 2015-10-02 LAB — MRSA PCR SCREENING: MRSA BY PCR: NEGATIVE

## 2015-10-02 LAB — CBC
HEMATOCRIT: 33.5 % — AB (ref 39.0–52.0)
HEMOGLOBIN: 11.3 g/dL — AB (ref 13.0–17.0)
MCH: 32.7 pg (ref 26.0–34.0)
MCHC: 33.7 g/dL (ref 30.0–36.0)
MCV: 96.8 fL (ref 78.0–100.0)
Platelets: 63 10*3/uL — ABNORMAL LOW (ref 150–400)
RBC: 3.46 MIL/uL — ABNORMAL LOW (ref 4.22–5.81)
RDW: 16.1 % — ABNORMAL HIGH (ref 11.5–15.5)
WBC: 4.6 10*3/uL (ref 4.0–10.5)

## 2015-10-02 LAB — GLUCOSE, CAPILLARY
GLUCOSE-CAPILLARY: 107 mg/dL — AB (ref 65–99)
GLUCOSE-CAPILLARY: 145 mg/dL — AB (ref 65–99)
GLUCOSE-CAPILLARY: 190 mg/dL — AB (ref 65–99)

## 2015-10-02 LAB — TROPONIN I
TROPONIN I: 0.03 ng/mL (ref <0.031)
Troponin I: 0.04 ng/mL — ABNORMAL HIGH (ref <0.031)
Troponin I: 0.05 ng/mL — ABNORMAL HIGH (ref <0.031)

## 2015-10-02 LAB — CBG MONITORING, ED: Glucose-Capillary: 144 mg/dL — ABNORMAL HIGH (ref 65–99)

## 2015-10-02 MED ORDER — IPRATROPIUM-ALBUTEROL 0.5-2.5 (3) MG/3ML IN SOLN
3.0000 mL | Freq: Two times a day (BID) | RESPIRATORY_TRACT | Status: DC
  Administered 2015-10-02 – 2015-10-03 (×2): 3 mL via RESPIRATORY_TRACT
  Filled 2015-10-02 (×2): qty 3

## 2015-10-02 MED ORDER — SODIUM CHLORIDE 0.9 % IV SOLN
INTRAVENOUS | Status: DC
  Administered 2015-10-02 – 2015-10-03 (×2): via INTRAVENOUS

## 2015-10-02 MED ORDER — INSULIN ASPART PROT & ASPART (70-30 MIX) 100 UNIT/ML ~~LOC~~ SUSP
15.0000 [IU] | Freq: Two times a day (BID) | SUBCUTANEOUS | Status: DC
  Administered 2015-10-02 – 2015-10-03 (×3): 15 [IU] via SUBCUTANEOUS
  Filled 2015-10-02 (×2): qty 10

## 2015-10-02 MED ORDER — EZETIMIBE 10 MG PO TABS
10.0000 mg | ORAL_TABLET | Freq: Every day | ORAL | Status: DC
  Administered 2015-10-02: 10 mg via ORAL
  Filled 2015-10-02 (×2): qty 1

## 2015-10-02 MED ORDER — ENOXAPARIN SODIUM 40 MG/0.4ML ~~LOC~~ SOLN
40.0000 mg | SUBCUTANEOUS | Status: DC
  Administered 2015-10-02 – 2015-10-03 (×2): 40 mg via SUBCUTANEOUS
  Filled 2015-10-02 (×2): qty 0.4

## 2015-10-02 MED ORDER — INSULIN ASPART 100 UNIT/ML ~~LOC~~ SOLN
SUBCUTANEOUS | Status: AC
  Filled 2015-10-02: qty 1

## 2015-10-02 MED ORDER — NADOLOL 20 MG PO TABS
20.0000 mg | ORAL_TABLET | Freq: Every day | ORAL | Status: DC
  Administered 2015-10-02: 20 mg via ORAL
  Filled 2015-10-02 (×5): qty 1

## 2015-10-02 MED ORDER — ONDANSETRON HCL 4 MG PO TABS: 4.0000 mg | ORAL_TABLET | Freq: Four times a day (QID) | ORAL | Status: DC | PRN

## 2015-10-02 MED ORDER — ACETAMINOPHEN 650 MG RE SUPP: 650.0000 mg | Freq: Four times a day (QID) | RECTAL | Status: DC | PRN

## 2015-10-02 MED ORDER — ACETAMINOPHEN 325 MG PO TABS: 650.0000 mg | ORAL_TABLET | Freq: Four times a day (QID) | ORAL | Status: DC | PRN

## 2015-10-02 MED ORDER — CLOPIDOGREL BISULFATE 75 MG PO TABS
75.0000 mg | ORAL_TABLET | Freq: Every day | ORAL | Status: DC
  Administered 2015-10-02 – 2015-10-03 (×2): 75 mg via ORAL
  Filled 2015-10-02 (×2): qty 1

## 2015-10-02 MED ORDER — SODIUM CHLORIDE 0.9 % IV SOLN
INTRAVENOUS | Status: AC
  Administered 2015-10-02: 03:00:00 via INTRAVENOUS

## 2015-10-02 MED ORDER — RIFAXIMIN 550 MG PO TABS
550.0000 mg | ORAL_TABLET | Freq: Two times a day (BID) | ORAL | Status: DC
  Administered 2015-10-02 – 2015-10-03 (×3): 550 mg via ORAL
  Filled 2015-10-02 (×5): qty 1

## 2015-10-02 MED ORDER — DILTIAZEM HCL ER COATED BEADS 120 MG PO CP24: 120.0000 mg | ORAL_CAPSULE | Freq: Every day | ORAL | Status: DC

## 2015-10-02 MED ORDER — PREDNISONE 10 MG PO TABS
5.0000 mg | ORAL_TABLET | Freq: Every day | ORAL | Status: DC
  Administered 2015-10-02 – 2015-10-03 (×2): 5 mg via ORAL
  Filled 2015-10-02 (×2): qty 1

## 2015-10-02 MED ORDER — DULOXETINE HCL 60 MG PO CPEP
60.0000 mg | ORAL_CAPSULE | Freq: Every day | ORAL | Status: DC
  Administered 2015-10-02 – 2015-10-03 (×2): 60 mg via ORAL
  Filled 2015-10-02: qty 2
  Filled 2015-10-02: qty 1

## 2015-10-02 MED ORDER — OXYCODONE-ACETAMINOPHEN 5-325 MG PO TABS
1.0000 | ORAL_TABLET | ORAL | Status: DC | PRN
  Administered 2015-10-02 (×4): 1 via ORAL
  Filled 2015-10-02 (×4): qty 1

## 2015-10-02 MED ORDER — LORATADINE 10 MG PO TABS
10.0000 mg | ORAL_TABLET | ORAL | Status: DC
  Administered 2015-10-03: 10 mg via ORAL
  Filled 2015-10-02: qty 1

## 2015-10-02 MED ORDER — INSULIN ASPART 100 UNIT/ML ~~LOC~~ SOLN
0.0000 [IU] | Freq: Three times a day (TID) | SUBCUTANEOUS | Status: DC
  Administered 2015-10-02 (×2): 1 [IU] via SUBCUTANEOUS
  Administered 2015-10-03: 5 [IU] via SUBCUTANEOUS
  Administered 2015-10-03: 1 [IU] via SUBCUTANEOUS

## 2015-10-02 MED ORDER — ONDANSETRON HCL 4 MG/2ML IJ SOLN: 4.0000 mg | Freq: Four times a day (QID) | INTRAMUSCULAR | Status: DC | PRN

## 2015-10-02 MED ORDER — IPRATROPIUM-ALBUTEROL 0.5-2.5 (3) MG/3ML IN SOLN
3.0000 mL | Freq: Four times a day (QID) | RESPIRATORY_TRACT | Status: DC | PRN
  Administered 2015-10-02: 3 mL via RESPIRATORY_TRACT
  Filled 2015-10-02: qty 3

## 2015-10-02 MED ORDER — LACTULOSE 10 GM/15ML PO SOLN: 10.0000 g | Freq: Two times a day (BID) | ORAL | Status: DC | PRN

## 2015-10-02 NOTE — ED Notes (Signed)
Ambulated patient around nurses desk. Patient got dizzy while standing at sink in bathroom. Made doctor aware of how he done. Patient also stated he had lost almost 100 lbs in 8 months without trying. Made doctor aware

## 2015-10-02 NOTE — ED Notes (Signed)
Patient eating breakfast. Alert/oriented.

## 2015-10-02 NOTE — ED Notes (Signed)
RT called for breathing treatment that patient is requesting.

## 2015-10-02 NOTE — Care Management Note (Signed)
Case Management Note  Patient Details  Name: [name] MRN: [id number] Date of Birth: 05/26/1945  Subjective/Objective: Patient is from home with wife. He is independent with ADL's. He still drives. He walks with a cane.  He reports history of having home health in the past through [facility]. His PCP is Dr. Garner Nash of [place]. He has medicare and reports he can afford his medications.                  Action/Plan: No CM needs identified at this time. Will cont to follow.   Expected Discharge Date:  10/03/15               Expected Discharge Plan:  Home/Self Care  In-House Referral:     Discharge planning Services  CM Consult  Post Acute Care Choice:  NA Choice offered to:  NA  DME Arranged:    DME Agency:     HH Arranged:    HH Agency:     Status of Service:  Completed, signed off  Medicare Important Message Given:    Date Medicare IM Given:    Medicare IM give by:    Date Additional Medicare IM Given:    Additional Medicare Important Message give by:     If discussed at Long Length of Stay Meetings, dates discussed:    Additional Comments:  Quintavia Rogstad, Chrystine Oiler, RN 10/02/2015, 2:59 PM

## 2015-10-02 NOTE — ED Notes (Signed)
Awaiting breathing treatment. Will admit to 320 when treatment complete. Report given to [place], Charity fundraiser.

## 2015-10-02 NOTE — Consult Note (Signed)
CARDIOLOGY CONSULT NOTE   Patient ID: [name] MRN: [id number] DOB/AGE: Sep 04, 1945 70 y.o.  Admit Date: 10/01/2015 Referring Physician: TRH-Gherghe MD Primary Physician: Donzetta Sprung, MD Consulting Cardiologist: Wyline Mood. Christiane Ha MD Primary Cardiologist: Prentice Docker MD Reason for Consultation: Hypotension and PPM evaluation.   Clinical Summary [name] is a 70 y.o.male followed by Dr. Purvis Sheffield in the [place] office with know history of chronic diastolic CHF, paroxysmal found to be a poor candidate for anticoagulation due to portal hypertension and esophageal varicies, but remains on Plavix and ASA, atrial fibrillation, CAD with CABG and TAVR, symptomatic bradycardia with PPM in situ followed by Dr. Ladona Ridgel.   He states that he was feeling woozy and lightheaded yesterday afternoon. He had also been having frequent bowel movements the day before. He had a syncopal episode in the parking lot of [employer] last evening. He does not remember falling, but awoke in the parking lot with EMS having been called. He sustained significant lacerations to the right side of his face, scalp, arms, and torso. Some complaints of right hip pain.   He presented to the ER with complaints of dizziness and feeling off balance. He was found to have mild hypotension, as low as 96/60. Pertinent labs revealed Troponin 0.05, thrombocytopenia with PLT, 63,000, Na 134,with K 3.5. CXR No CHF or pneumonia, or acute cardiopulmonary process. EKG demonstrated ventricular pacing. He was found be to orthostatic on further examination with BP dropping from 121/47 to 84/54.   He states early symptoms began after changing medications, and also having lack of appetite. Per GI notes on 09/23/2015, he had lost 10 lbs.   Allergies  Allergen Reactions  . Xarelto [Rivaroxaban]     Caused bleeding from unknown source     Medications Scheduled Medications: . clopidogrel  75 mg Oral Daily  . DULoxetine  60 mg Oral  Daily  . enoxaparin (LOVENOX) injection  40 mg Subcutaneous Q24H  . ezetimibe  10 mg Oral QHS  . insulin aspart      . insulin aspart  0-9 Units Subcutaneous TID WC  . insulin aspart protamine- aspart  15 Units Subcutaneous BID WC  . loratadine  10 mg Oral BH-q7a  . nadolol  20 mg Oral Daily  . predniSONE  5 mg Oral Q breakfast  . rifaximin  550 mg Oral BID    Infusions: . sodium chloride 50 mL/hr at 10/02/15 0309    PRN Medications: acetaminophen **OR** acetaminophen, ipratropium-albuterol, lactulose, ondansetron **OR** ondansetron (ZOFRAN) IV, oxyCODONE-acetaminophen   Past Medical History  Diagnosis Date  . COPD (chronic obstructive pulmonary disease) (HCC)   . Elevated liver function tests   . Hypertension   . Aortic stenosis     a. 2D ECHO: 01/30/2014: ef 50-55%. No RWMAs. Severe JW:TGRMB area (VTI): 0.82 cm^2. Valve area (Vmax): 0.74 cm^2. Valve area (Vmean): 0.75 Cm^2. Mod LA/RA dilation. PA peak pressure 33  . CAD (coronary artery disease)   . GERD (gastroesophageal reflux disease)   . Chronic atrial flutter (HCC)   . 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 Morbid obesity (HCC)   . Coronary atherosclerosis of artery bypass graft 2010  . Acute on chronic diastolic heart failure (HCC) 02/01/2014  . Chronic diastolic congestive heart failure (HCC) 02/01/2014  . Epistaxis     Developed on Xarelto therapy  . Cirrhosis (HCC) 02/01/2014  .  Portal hypertension (HCC) 02/01/2014  . Chronic pancreatitis (HCC) 02/01/2014    Noted on CT scan  . Shortness of breath   . Dizziness   . Heart murmur   . Asthma     uses nebulizers daily  . Dysrhythmia     fib/flutter  . Anemia     was on xalerto sept 2015 for afib - was taken off of - had to get unit of prbc  . History of blood transfusion 01/2014    "right after starting Xeralto"  . 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 Hyperlipidemia   . Pneumonia "several times:  . Type II diabetes mellitus (HCC)   . Rheumatoid arthritis(714.0)     "everywhere"  . Degenerative disc disease, cervical   . Degenerative disc disease, lumbar 2010  . Osteoarthritis     "everywhere"  . Chronic back pain     "lower back usually; upper back fior the last couple weeks"  (12/26/2014)  . ARF (acute renal failure) (HCC) 11/2014  . On home oxygen therapy     "2.5L at night & prn during the day" (12/26/2014)  . Other myositis, left thigh dx'd 12/26/2014  . Acute renal failure (HCC) 12/30/2014  . Atrial fibrillation (HCC) 12/30/2014  . 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 Hyposmolality and/or hyponatremia 12/30/2014  . CHB (complete heart block) (HCC) 12/30/2014  . Elevated troponin level, secondary to CHB acute renal failure 12/30/2014  . Complication of anesthesia     "has a hard time breathing when he wakes up"    Past Surgical History  Procedure Laterality Date  . Coronary angioplasty with stent placement  01/02/2009    s/p bare metal stent to the vein graft to the RCA 9/20. nml LV function   . Appendectomy  1950  . Coronary artery bypass graft  03/27/1997    CABG x 5 by Dr Cornelius Moras  . Anterior cervical decomp/discectomy fusion  12/2006  . Tonsillectomy and adenoidectomy  1950  . Lumbar disc surgery  2011    "put plastic wedge in"  . Esophagogastroduodenoscopy (egd) with propofol N/A 02/01/2014    Dr. Bosie Clos: small distal esophageal varices and mild portal gastropathy  . Colonoscopy with propofol N/A 02/01/2014    Sigmoid diverticulosis   . Multiple extractions with alveoloplasty N/A 03/12/2014    Procedure: MULTIPLE EXTRACTION OF TOOTH #'S 2,6,7,8,9,10,11,14,18,20,29,31 WITH ALVEOLOPLASTY AND GROSS DEBRIDMENT OF REMAINING TEETH;  Surgeon: Charlynne Pander, DDS;  Location: [facility] OR;  Service:  Oral Surgery;  Laterality: N/A;  . Back surgery    . Transcatheter aortic valve replacement, transfemoral N/A 04/03/2014    Procedure: TRANSCATHETER AORTIC VALVE REPLACEMENT, TRANSFEMORAL;  Surgeon: Purcell Nails, MD;  Location: [facility] OR;  Service: Open Heart Surgery;  Laterality: N/A;  . Intraoperative transesophageal echocardiogram N/A 04/03/2014    Procedure: INTRAOPERATIVE TRANSESOPHAGEAL ECHOCARDIOGRAM;  Surgeon: Purcell Nails, MD;  Location: [facility] OR;  Service: Open Heart Surgery;  Laterality: N/A;  . Left and right heart catheterization with coronary/graft angiogram N/A 02/05/2014    Procedure: LEFT AND RIGHT HEART CATHETERIZATION WITH Isabel Caprice;  Surgeon: Peter M Swaziland, MD;  Location: [facility] CATH LAB;  Service: Cardiovascular;  Laterality: N/A;  . Posterior laminectomy / decompression cervical spine  05/2007  . Ep implantable device N/A 12/27/2014    Procedure: Pacemaker Implant;  Surgeon: Doylene Canning  Ladona Ridgel, MD;  Location: [facility] INVASIVE CV LAB;  Service: Cardiovascular;  Laterality: N/A;  . Insert / replace / remove pacemaker    . Esophagogastroduodenoscopy (egd) with propofol N/A 07/22/2015    Dr. Darrick Penna: grade 1 varices, portal gastropathy, Nadolol started. Repeat EGD in 1-2 years to reassess varices and possible banding   . Savory dilation N/A 07/22/2015    Procedure: SAVORY DILATION;  Surgeon: West Bali, MD;  Location: [facility] ENDO SUITE;  Service: Endoscopy;  Laterality: N/A;    Family History  Problem Relation Age of Onset  . Hypertension Mother   . Cancer Father   . Stroke Brother   . Liver cancer Paternal Grandfather   . Rheum arthritis Maternal Aunt   . Asthma Child   . Emphysema Brother     smoked  . Colon cancer Neg Hx     Social History [name] reports that he quit smoking about 18 years ago. His smoking use included Cigarettes. He started smoking about 54 years ago. He has a 105 pack-year smoking history. His smokeless tobacco use includes Chew. [name]  reports that he does not drink alcohol.  Review of Systems Complete review of systems are found to be negative unless outlined in H&P above.  Physical Examination Blood pressure 96/60, pulse 59, temperature 97.9 F (36.6 C), temperature source Oral, resp. rate 13, height 5\' 11"  (1.803 m), weight 200 lb (90.719 kg), SpO2 93 %. No intake or output data in the 24 hours ending 10/02/15 0902  Telemetry:Paced  GEN: Sore but no acute distress HEENT: Conjunctiva and lids normal, oropharynx clear with moist mucosa. Neck: Supple, no elevated JVP or carotid bruits, no thyromegaly. Lungs: Clear to auscultation, nonlabored breathing at rest. Cardiac: Regular rate and rhythm, no S3 or significant systolic murmur, no pericardial rub. Abdomen: Soft, nontender, no hepatomegaly, bowel sounds present, no guarding or rebound. Extremities: No pitting edema, distal pulses 2+.Several lacerations to the face, head, arms and hands on the right.  Skin: Warm and dry. Musculoskeletal: No kyphosis. Neuropsychiatric: Alert and oriented x3, affect grossly appropriate.  Prior Cardiac Testing/Procedures  1.Cardiac Cath (R and L) 02/05/2014 Procedural Findings: Hemodynamics RA */14 mean 10 mm Hg RV 34/10 mm hg PA 33/9 mean 22 mm Hg PCWP */21 mean 14 mm Hg LV 146/13 mm Hg AO 115/60 mean 83 mm Hg AV mean gradient 21 mm Hg. AV area 2.3 cm squared. Index 0.97  Final Conclusions:  1. Severe 3 vessel obstructive CAD 2. Patent LIMA to the LAD 3. Patent SVG to RCA 4. Patent SVG to diagonal with 80% proximal stenosis. 5. Patent SVG to the OM1 and OM2. 80% stenosis in continuation of graft to OM2.  6. AV mean gradient of 20 mm Hg. Data may be influenced by presence of Afib. 7. Normal right heart pressures.   Pacemaker Implant Permanent pacemaker implantation on 12/27/14 by Dr 12/29/14. The patient received a MDT single chamber pacemaker with model number 5076 RV lead.  Lab Results  Basic Metabolic  Panel:  Recent Labs Lab 10/01/15 2209 10/02/15 0557  NA 135 134*  K 3.6 3.5  CL 100* 102  CO2 29 27  GLUCOSE 96 172*  BUN 15 16  CREATININE 1.00 0.91  CALCIUM 8.7* 8.3*    Liver Function Tests:  Recent Labs Lab 10/02/15 0557  AST 65*  ALT 56  ALKPHOS 130*  BILITOT 1.4*  PROT 6.1*  ALBUMIN 2.3*    CBC:  Recent Labs Lab 10/01/15 2209 10/02/15 0557  WBC 4.8 4.6  HGB 12.5* 11.3*  HCT 37.8* 33.5*  MCV 96.9 96.8  PLT 77* 63*    Cardiac Enzymes:  Recent Labs Lab 10/01/15 2209 10/02/15 0152 10/02/15 0745  TROPONINI 0.04* 0.04* 0.05*    Radiology: Dg Chest 1 View  10/02/2015  CLINICAL DATA:  Trip and fall in a parking lot this evening. Anterior chest pain. EXAM: CHEST 1 VIEW COMPARISON:  02/28/2015 FINDINGS: Post median sternotomy and CABG and aortic valve rib. Single lead pacemaker remains in place with lead projecting over the right ventricle. Heart is normal in size. Chronic interstitial coarsening. No pneumothorax. No large pleural effusion or focal airspace disease. Remote right rib fractures are again seen. No acute osseous abnormality. Postsurgical change in the cervical spine, partially included. IMPRESSION: No acute process. Electronically Signed   By: Rubye Oaks M.D.   On: 10/02/2015 00:04   Ct Head Wo Contrast  10/02/2015  CLINICAL DATA:  Status post fall at [facility] parking lot, with knot at the forehead. Concern for head or cervical spine injury. Initial encounter. EXAM: CT HEAD WITHOUT CONTRAST CT CERVICAL SPINE WITHOUT CONTRAST TECHNIQUE: Multidetector CT imaging of the head and cervical spine was performed following the standard protocol without intravenous contrast. Multiplanar CT image reconstructions of the cervical spine were also generated. COMPARISON:  CT of the head performed 11/13/2014, and MRI of the brain performed 10/29/2014. CT of the neck performed 02/20/2014 FINDINGS: CT HEAD FINDINGS There is no evidence of acute  infarction, mass lesion, or intra- or extra-axial hemorrhage on CT. Prominence of the ventricles and sulci reflects mild cortical volume loss. Mild periventricular white matter change likely reflects small vessel ischemic microangiopathy. A chronic lacunar infarct is noted at the right pons. The fourth ventricle is within normal limits. The basal ganglia are unremarkable in appearance. The cerebral hemispheres demonstrate grossly normal gray-white differentiation. No mass effect or midline shift is seen. There is no evidence of fracture; visualized osseous structures are unremarkable in appearance. The orbits are within normal limits. There appears to be a small amount of blood within the right maxillary sinus. The remaining paranasal sinuses and mastoid air cells are well-aerated. Soft tissue swelling is noted overlying the right frontal calvarium. CT CERVICAL SPINE FINDINGS There is no evidence of fracture or subluxation. The patient is status post anterior cervical spinal fusion at C3-C4, and posterior cervical spinal fusion at C7-T1. Small anterior and posterior disc osteophyte complexes are seen along the cervical spine. Vertebral bodies demonstrate normal height and alignment. Prevertebral soft tissues are within normal limits. The visualized portions of the thyroid gland are unremarkable in appearance. The visualized lung apices are clear. Prominent calcification is seen at the carotid bifurcations bilaterally, more prominent on the left, with likely moderate luminal narrowing bilaterally. IMPRESSION: 1. No evidence of traumatic intracranial injury or fracture. 2. Small amount of blood within the right maxillary sinus, without definite evidence of an associated fracture on reconstructions. 3. No evidence of fracture or subluxation along the cervical spine. 4. Soft tissue swelling overlying the right frontal calvarium. 5. Mild cortical volume loss and scattered small vessel ischemic microangiopathy. 6. Chronic  lacunar infarct at the right pons. 7. Status post anterior cervical spinal fusion at C3-C4, and posterior cervical spinal fusion at C7-T1. Mild underlying degenerative change noted. 8. Prominent calcification at the carotid bifurcations bilaterally, more prominent on the left, with likely moderate luminal narrowing bilaterally. Carotid ultrasound is recommended for further evaluation, when and as deemed clinically appropriate. Electronically Signed  By: Roanna Raider M.D.   On: 10/02/2015 00:04   Ct Cervical Spine Wo Contrast  10/02/2015  CLINICAL DATA:  Status post fall at [facility] parking lot, with knot at the forehead. Concern for head or cervical spine injury. Initial encounter. EXAM: CT HEAD WITHOUT CONTRAST CT CERVICAL SPINE WITHOUT CONTRAST TECHNIQUE: Multidetector CT imaging of the head and cervical spine was performed following the standard protocol without intravenous contrast. Multiplanar CT image reconstructions of the cervical spine were also generated. COMPARISON:  CT of the head performed 11/13/2014, and MRI of the brain performed 10/29/2014. CT of the neck performed 02/20/2014 FINDINGS: CT HEAD FINDINGS There is no evidence of acute infarction, mass lesion, or intra- or extra-axial hemorrhage on CT. Prominence of the ventricles and sulci reflects mild cortical volume loss. Mild periventricular white matter change likely reflects small vessel ischemic microangiopathy. A chronic lacunar infarct is noted at the right pons. The fourth ventricle is within normal limits. The basal ganglia are unremarkable in appearance. The cerebral hemispheres demonstrate grossly normal gray-white differentiation. No mass effect or midline shift is seen. There is no evidence of fracture; visualized osseous structures are unremarkable in appearance. The orbits are within normal limits. There appears to be a small amount of blood within the right maxillary sinus. The remaining paranasal sinuses and  mastoid air cells are well-aerated. Soft tissue swelling is noted overlying the right frontal calvarium. CT CERVICAL SPINE FINDINGS There is no evidence of fracture or subluxation. The patient is status post anterior cervical spinal fusion at C3-C4, and posterior cervical spinal fusion at C7-T1. Small anterior and posterior disc osteophyte complexes are seen along the cervical spine. Vertebral bodies demonstrate normal height and alignment. Prevertebral soft tissues are within normal limits. The visualized portions of the thyroid gland are unremarkable in appearance. The visualized lung apices are clear. Prominent calcification is seen at the carotid bifurcations bilaterally, more prominent on the left, with likely moderate luminal narrowing bilaterally. IMPRESSION: 1. No evidence of traumatic intracranial injury or fracture. 2. Small amount of blood within the right maxillary sinus, without definite evidence of an associated fracture on reconstructions. 3. No evidence of fracture or subluxation along the cervical spine. 4. Soft tissue swelling overlying the right frontal calvarium. 5. Mild cortical volume loss and scattered small vessel ischemic microangiopathy. 6. Chronic lacunar infarct at the right pons. 7. Status post anterior cervical spinal fusion at C3-C4, and posterior cervical spinal fusion at C7-T1. Mild underlying degenerative change noted. 8. Prominent calcification at the carotid bifurcations bilaterally, more prominent on the left, with likely moderate luminal narrowing bilaterally. Carotid ultrasound is recommended for further evaluation, when and as deemed clinically appropriate. Electronically Signed   By: Roanna Raider M.D.   On: 10/02/2015 00:04   Dg Femur, Min 2 Views Right  10/02/2015  CLINICAL DATA:  Trip and fall in a parking lot this evening. Mid right thigh pain. EXAM: RIGHT FEMUR 2 VIEWS COMPARISON:  None. FINDINGS: There is no evidence of fracture or other focal bone lesions. Hip and  knee alignment is maintained. Vascular calcifications are seen. Surgical clips in the medial soft tissues. IMPRESSION: No right femur fracture. Electronically Signed   By: Rubye Oaks M.D.   On: 10/02/2015 00:02     ECG: Ventricular pacing    Impression and Recommendations  1.Syncopal Episode: Found to be orthostatic on ER evaluation with 40 point drop. Multifactorial in the setting of mediations, probable dehydration from frequent stools, and liver disease. He has been  seen and examined by Dr. Wyline Mood as well. Plan to stop torsemide daily dosing and use prn, give IV hydration and adjust medications down to allow for BP to normalize. He is on diltiazem 120 mg, nadolol 20 mg daily, not on ACE or ARB.   2. History of symptomatic Bradycardia: Medtronic PPM in situ. Will have Medtr[employer] rep interrogate. Patient states that PPM was interrogated in ER but no documentation was found of the results.   3. S/P TAVR: Echocardiogram to evaluate status.   4. CAD: Hx of CABG: Continue ASA and Plavix. Currently not on statin due to liver disease. On Zetia.   5. PAF: Not an anticoagulation candidate due to esophageal varacies and portal hypertension    Signed: Bettey Mare. Lawrence NP [facility]  10/02/2015, 9:02 AM Co-Sign MD  Patient seen and discussed with NP Lyman Bishop, I agree with her documentation. 70 yo male with history of chronic diastolic HF, afib/aflutter not on anticoag due to esophageal varices, previous TAVR, CAD with prior CABG, bradycardia now with pacemaker, admitted with syncope. Episode occurred shortly after getting out of car. Reports recent diarrhea, poor oral intake. PCP recently changed Toprol XL  daily to nadolol  daily.    Severely orthostatic in ER likely due to hypovolemia given GI losses and poor intake combined with chronic vasodilatation from cirrhosis combined with home meds (av nodal agents, diuretics). Mild flat troponin, overall not consistent with ACS, potentially  related to transient hypotension and drop in coronary perfusion pressure in patient with known CAD, no plans for ischemic testing at this time. Probably all related to orthostatic hypotension however given his cardiac comorbidities he will require further testing. Given his history of bradycardia with pacemaker we will plan to have device interrogated. Given his history of AVR we will also repeat echo. Agree with holding dilt for now, likely add back tomorrow. Continue nadolol at this time given additional benefit for esophageal varices, hold torsemide, would anticipate changing to prn only at discharge.    Addendum Pacemaker interrogation reviewed, normal function.           Dominga Ferry MD

## 2015-10-02 NOTE — Care Management Obs Status (Signed)
MEDICARE OBSERVATION STATUS NOTIFICATION   Patient Details  Name: [name] MRN: [id number] Date of Birth: 1945/09/04   Medicare Observation Status Notification Given:  Yes    Emrick Hensch, Chrystine Oiler, RN 10/02/2015, 2:57 PM

## 2015-10-02 NOTE — H&P (Signed)
TRH H&P   Patient Demographics:    [name], is a 70 y.o. male  MRN: [id number]   DOB - 1946/03/20  Admit Date - 10/01/2015  Outpatient Primary MD for the patient is Donzetta Sprung, MD  Referring MD/NP/PA: Dr. Bebe Shaggy  Outpatient Specialists: Dr Purvis Sheffield  Patient coming from: [employer] parking lot  Chief Complaint  Patient presents with  . Loss of Consciousness      HPI:    [name]  is a 69 y.o. male, With history of CAD, aortic stenosis, status post cardiac pacemaker, hypertension, diabetes mellitus who was brought to the ED after patient had an episode of loss of consciousness at [facility] parking lot. As the patient's wife patient was not feeling well and was dizzy at home but somehow felt that he could go for shopping at [facility]. As soon as he came out of car patient became wobbly and passed out. Patient denies  shortness of breath. No history of seizures. Patient regained consciousness after he fell very quickly. Patient complains of intermittent chest pain. As per patient's wife he was started on nadolol recently, by his PCP. Patient has history of nonalcoholic cirrhosis, and takes rifaximin twice a day. Patient also take lactulose daily. He easily gets loose bowel movements had 4-5 loose bowel movements yesterday. He only had 1 loose bowel movement today. In the ED patient was found to be in orthostatic hypotension, blood pressure dropped to 84/54 on standing, lying blood pressure was 121/47.    Review of systems:    In addition to the HPI above, Will No Fever-chills, No Headache, No changes with Vision or hearing, No problems swallowing food or Liquids, No Abdominal pain, No Nausea or Vomitting, he has a history of loose bowel movements No Blood in stool or Urine, No dysuria, No new skin rashes or bruises, No new joints pains-aches,  No new weakness, tingling, numbness in any  extremity, No recent weight gain or loss, No polyuria, polydypsia or polyphagia, No significant Mental Stressors.  A full 10 point Review of Systems was done, except as stated above, all other Review of Systems were negative.   With Past History of the following :    Past Medical History  Diagnosis Date  . COPD (chronic obstructive pulmonary disease) (HCC)   . Elevated liver function tests   . Hypertension   . Aortic stenosis     a. 2D ECHO: 01/30/2014: ef 50-55%. No RWMAs. Severe WU:JWJXB area (VTI): 0.82 cm^2. Valve area (Vmax): 0.74 cm^2. Valve area (Vmean): 0.75 Cm^2. Mod LA/RA dilation. PA peak pressure 33  . CAD (coronary artery disease)   . GERD (gastroesophageal reflux disease)   . Chronic atrial flutter (HCC)   . 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 Morbid obesity (HCC)   . Coronary atherosclerosis of artery bypass graft 2010  . Acute on chronic diastolic heart failure (  HCC) 02/01/2014  . Chronic diastolic congestive heart failure (HCC) 02/01/2014  . Epistaxis     Developed on Xarelto therapy  . Cirrhosis (HCC) 02/01/2014  . Portal hypertension (HCC) 02/01/2014  . Chronic pancreatitis (HCC) 02/01/2014    Noted on CT scan  . Shortness of breath   . Dizziness   . Heart murmur   . Asthma     uses nebulizers daily  . Dysrhythmia     fib/flutter  . Anemia     was on xalerto sept 2015 for afib - was taken off of - had to get unit of prbc  . History of blood transfusion 01/2014    "right after starting Xeralto"  . 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 Hyperlipidemia   . Pneumonia "several times:  . Type II diabetes mellitus (HCC)   . Rheumatoid arthritis(714.0)     "everywhere"  . Degenerative disc disease, cervical   . Degenerative disc disease, lumbar 2010    . Osteoarthritis     "everywhere"  . Chronic back pain     "lower back usually; upper back fior the last couple weeks"  (12/26/2014)  . ARF (acute renal failure) (HCC) 11/2014  . On home oxygen therapy     "2.5L at night & prn during the day" (12/26/2014)  . Other myositis, left thigh dx'd 12/26/2014  . Acute renal failure (HCC) 12/30/2014  . Atrial fibrillation (HCC) 12/30/2014  . 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 Hyposmolality and/or hyponatremia 12/30/2014  . CHB (complete heart block) (HCC) 12/30/2014  . Elevated troponin level, secondary to CHB acute renal failure 12/30/2014  . Complication of anesthesia     "has a hard time breathing when he wakes up"      Past Surgical History  Procedure Laterality Date  . Coronary angioplasty with stent placement  01/02/2009    s/p bare metal stent to the vein graft to the RCA 9/20. nml LV function   . Appendectomy  1950  . Coronary artery bypass graft  03/27/1997    CABG x 5 by Dr Cornelius Moras  . Anterior cervical decomp/discectomy fusion  12/2006  . Tonsillectomy and adenoidectomy  1950  . Lumbar disc surgery  2011    "put plastic wedge in"  . Esophagogastroduodenoscopy (egd) with propofol N/A 02/01/2014    Dr. Bosie Clos: small distal esophageal varices and mild portal gastropathy  . Colonoscopy with propofol N/A 02/01/2014    Sigmoid diverticulosis   . Multiple extractions with alveoloplasty N/A 03/12/2014    Procedure: MULTIPLE EXTRACTION OF TOOTH #'S 2,6,7,8,9,10,11,14,18,20,29,31 WITH ALVEOLOPLASTY AND GROSS DEBRIDMENT OF REMAINING TEETH;  Surgeon: Charlynne Pander, DDS;  Location: [facility] OR;  Service: Oral Surgery;  Laterality: N/A;  . Back surgery    . Transcatheter aortic valve replacement, transfemoral N/A 04/03/2014    Procedure: TRANSCATHETER AORTIC VALVE REPLACEMENT, TRANSFEMORAL;  Surgeon: Purcell Nails, MD;  Location: [facility] OR;  Service: Open Heart Surgery;  Laterality: N/A;  . Intraoperative  transesophageal echocardiogram N/A 04/03/2014    Procedure: INTRAOPERATIVE TRANSESOPHAGEAL ECHOCARDIOGRAM;  Surgeon: Purcell Nails, MD;  Location: [facility] OR;  Service: Open Heart Surgery;  Laterality: N/A;  . Left and right heart catheterization with coronary/graft angiogram N/A 02/05/2014    Procedure: LEFT AND RIGHT HEART CATHETERIZATION WITH Isabel Caprice;  Surgeon: Peter M Swaziland, MD;  Location: [facility] CATH LAB;  Service:  Cardiovascular;  Laterality: N/A;  . Posterior laminectomy / decompression cervical spine  05/2007  . Ep implantable device N/A 12/27/2014    Procedure: Pacemaker Implant;  Surgeon: Marinus Maw, MD;  Location: [facility] INVASIVE CV LAB;  Service: Cardiovascular;  Laterality: N/A;  . Insert / replace / remove pacemaker    . Esophagogastroduodenoscopy (egd) with propofol N/A 07/22/2015    Dr. Darrick Penna: grade 1 varices, portal gastropathy, Nadolol started. Repeat EGD in 1-2 years to reassess varices and possible banding   . Savory dilation N/A 07/22/2015    Procedure: SAVORY DILATION;  Surgeon: West Bali, MD;  Location: [facility] ENDO SUITE;  Service: Endoscopy;  Laterality: N/A;      Social History:     Social History  Substance Use Topics  . Smoking status: Former Smoker -- 3.00 packs/day for 35 years    Types: Cigarettes    Start date: 09/15/1961    Quit date: 11/23/1996  . Smokeless tobacco: Current User    Types: Chew  . Alcohol Use: No     Lives -  At home  Mobility - no limitation of mobility     Family History :     Family History  Problem Relation Age of Onset  . Hypertension Mother   . Cancer Father   . Stroke Brother   . Liver cancer Paternal Grandfather   . Rheum arthritis Maternal Aunt   . Asthma Child   . Emphysema Brother     smoked  . Colon cancer Neg Hx       Home Medications:   Prior to Admission medications   Medication Sig Start Date End Date Taking? Authorizing Provider  ascorbic acid (VITAMIN C) 500 MG tablet Take 500 mg by mouth  2 (two) times daily.    Yes Historical Provider, MD  cetirizine (ZYRTEC) 10 MG tablet Take 10 mg by mouth at bedtime.   Yes Historical Provider, MD  clopidogrel (PLAVIX) 75 MG tablet Take 1 tablet (75 mg total) by mouth daily. 03/10/15  Yes Laqueta Linden, MD  co-enzyme Q-10 30 MG capsule Take 30 mg by mouth at bedtime.    Yes Historical Provider, MD  CRESTOR 5 MG tablet Take 5 mg by mouth every other day. 3 day weekly 04/11/15  Yes Historical Provider, MD  diltiazem (CARDIZEM CD) 120 MG 24 hr capsule Take 1 capsule (120 mg total) by mouth daily. 11/12/14  Yes Laqueta Linden, MD  DULoxetine (CYMBALTA) 60 MG capsule Take 60 mg by mouth daily. 09/10/15  Yes Historical Provider, MD  ezetimibe (ZETIA) 10 MG tablet Take 10 mg by mouth at bedtime.    Yes Historical Provider, MD  insulin NPH-regular Human (NOVOLIN 70/30) (70-30) 100 UNIT/ML injection Inject 15 Units into the skin 2 (two) times daily. Patient taking differently: Inject 15-20 Units into the skin 2 (two) times daily. 40 as a base; <180 = 40 units 11/04/14  Yes Erick Blinks, MD  loratadine (CLARITIN) 10 MG tablet Take 10 mg by mouth every morning.    Yes Historical Provider, MD  Multiple Vitamin (MULTIVITAMIN) tablet Take 1 tablet by mouth daily.     Yes Historical Provider, MD  nadolol (CORGARD) 20 MG tablet Take 1 tablet (20 mg total) by mouth daily. 07/22/15  Yes West Bali, MD  oxyCODONE-acetaminophen (PERCOCET/ROXICET) 5-325 MG per tablet Take 1-2 tablets by mouth every 4 (four) hours as needed for moderate pain. Patient taking differently: Take 1 tablet by mouth every 4 (four) hours as  needed for moderate pain.  03/12/14  Yes Charlynne Pander, DDS  potassium chloride SA (K-DUR,KLOR-CON) 20 MEQ tablet Take 1 tablet (20 mEq total) by mouth 2 (two) times daily. 05/01/15  Yes Laqueta Linden, MD  predniSONE (DELTASONE) 5 MG tablet Take 5 mg by mouth daily with breakfast. Reported on 09/23/2015   Yes Historical Provider, MD    rifaximin (XIFAXAN) 550 MG TABS tablet Take 550 mg by mouth 2 (two) times daily.   Yes Historical Provider, MD  torsemide (DEMADEX) 20 MG tablet Take 20 mg by mouth 2 (two) times daily.   Yes Historical Provider, MD  albuterol-ipratropium (COMBIVENT) 18-103 MCG/ACT inhaler Inhale 2 puffs into the lungs every 6 (six) hours as needed for wheezing or shortness of breath.     Historical Provider, MD  Artificial Tear Ointment (DRY EYES OP) Apply 1 drop to eye daily as needed (dry eyes).    Historical Provider, MD  aspirin 81 MG tablet Take 1 tablet (81 mg total) by mouth daily. Patient not taking: Reported on 10/01/2015 02/07/14   Joline Salt Barrett, PA-C  desonide (DESOWEN) 0.05 % cream Apply 1 application topically 2 (two) times daily as needed (for rash).     Historical Provider, MD  flunisolide (NASAREL) 29 MCG/ACT (0.025%) nasal spray Place 2 sprays into the nose at bedtime as needed for rhinitis (for congestion). Dose is for each nostril.    Historical Provider, MD  ipratropium-albuterol (DUONEB) 0.5-2.5 (3) MG/3ML SOLN Take 3 mLs by nebulization every 6 (six) hours as needed (for shortness of breath).    Historical Provider, MD  ketoconazole (NIZORAL) 2 % cream Apply 1 application topically 2 (two) times daily as needed for irritation.     Historical Provider, MD  KRISTALOSE 20 G packet Take 10 g by mouth 2 (two) times daily as needed (diarrhea).  12/15/14   Historical Provider, MD  pantoprazole (PROTONIX) 40 MG tablet 1 po 30 mins prior to first meal Patient taking differently: Take 40 mg by mouth daily as needed (acid reflux).  07/22/15   West Bali, MD     Allergies:     Allergies  Allergen Reactions  . Xarelto [Rivaroxaban]     Caused bleeding from unknown source      Physical Exam:   Vitals  Blood pressure 120/56, pulse 61, temperature 97.9 F (36.6 C), temperature source Oral, resp. rate 18, height 5\' 11"  (1.803 m), weight 90.719 kg (200 lb), SpO2 95 %.   1. General Caucasian  male, sitting in wheelchair in NAD, cooperative with exam  2. Normal affect and insight, Not Suicidal or Homicidal, Awake Alert, Oriented X 3.  3. No F.N deficits, ALL C.Nerves Intact, Strength 5/5 all 4 extremities, Sensation intact all 4 extremities, Plantars down going.  4. Abrasions noted in the right forehead above right eyebrow.  5. Supple Neck, No JVD, No cervical lymphadenopathy appriciated, No Carotid Bruits.  6. Symmetrical Chest wall movement, Good air movement bilaterally, CTAB.  7. RRR, No Gallops, Rubs or Murmurs, No Parasternal Heave.  8. Positive Bowel Sounds, Abdomen Soft, No tenderness, No organomegaly appriciated,No rebound -guarding or rigidity.  9.  No Cyanosis, Normal Skin Turgor, No Skin Rash or Bruise.  10. Good muscle tone,  joints appear normal , no effusions, Normal ROM. Right hand in dressing      Data Review:    CBC  Recent Labs Lab 10/01/15 2209  WBC 4.8  HGB 12.5*  HCT 37.8*  PLT 77*  MCV 96.9  MCH 32.1  MCHC 33.1  RDW 16.2*   ------------------------------------------------------------------------------------------------------------------  Chemistries   Recent Labs Lab 10/01/15 2209  NA 135  K 3.6  CL 100*  CO2 29  GLUCOSE 96  BUN 15  CREATININE 1.00  CALCIUM 8.7*   ------------------------------------------------------------------------------------------------------------------ -------------------------------------------------------------------------------------------------------------------  Cardiac Enzymes  Recent Labs Lab 10/01/15 2209  TROPONINI 0.04*   ------------------------------------------------------------------------------------------------------------------    Component Value Date/Time   BNP 76.0 10/28/2014 2250     ---------------------------------------------------------------------------------------------------------------  Urinalysis    Component Value Date/Time   COLORURINE YELLOW  12/28/2014 2227   APPEARANCEUR CLOUDY* 12/28/2014 2227   LABSPEC 1.015 12/28/2014 2227   PHURINE 5.0 12/28/2014 2227   GLUCOSEU NEGATIVE 12/28/2014 2227   HGBUR LARGE* 12/28/2014 2227   BILIRUBINUR NEGATIVE 12/28/2014 2227   KETONESUR NEGATIVE 12/28/2014 2227   PROTEINUR 100* 12/28/2014 2227   UROBILINOGEN 0.2 12/28/2014 2227   NITRITE NEGATIVE 12/28/2014 2227   LEUKOCYTESUR NEGATIVE 12/28/2014 2227    ----------------------------------------------------------------------------------------------------------------   Imaging Results:    Dg Chest 1 View  10/02/2015  CLINICAL DATA:  Trip and fall in a parking lot this evening. Anterior chest pain. EXAM: CHEST 1 VIEW COMPARISON:  02/28/2015 FINDINGS: Post median sternotomy and CABG and aortic valve rib. Single lead pacemaker remains in place with lead projecting over the right ventricle. Heart is normal in size. Chronic interstitial coarsening. No pneumothorax. No large pleural effusion or focal airspace disease. Remote right rib fractures are again seen. No acute osseous abnormality. Postsurgical change in the cervical spine, partially included. IMPRESSION: No acute process. Electronically Signed   By: Rubye Oaks M.D.   On: 10/02/2015 00:04   Ct Head Wo Contrast  10/02/2015  CLINICAL DATA:  Status post fall at [facility] parking lot, with knot at the forehead. Concern for head or cervical spine injury. Initial encounter. EXAM: CT HEAD WITHOUT CONTRAST CT CERVICAL SPINE WITHOUT CONTRAST TECHNIQUE: Multidetector CT imaging of the head and cervical spine was performed following the standard protocol without intravenous contrast. Multiplanar CT image reconstructions of the cervical spine were also generated. COMPARISON:  CT of the head performed 11/13/2014, and MRI of the brain performed 10/29/2014. CT of the neck performed 02/20/2014 FINDINGS: CT HEAD FINDINGS There is no evidence of acute infarction, mass lesion, or intra- or  extra-axial hemorrhage on CT. Prominence of the ventricles and sulci reflects mild cortical volume loss. Mild periventricular white matter change likely reflects small vessel ischemic microangiopathy. A chronic lacunar infarct is noted at the right pons. The fourth ventricle is within normal limits. The basal ganglia are unremarkable in appearance. The cerebral hemispheres demonstrate grossly normal gray-white differentiation. No mass effect or midline shift is seen. There is no evidence of fracture; visualized osseous structures are unremarkable in appearance. The orbits are within normal limits. There appears to be a small amount of blood within the right maxillary sinus. The remaining paranasal sinuses and mastoid air cells are well-aerated. Soft tissue swelling is noted overlying the right frontal calvarium. CT CERVICAL SPINE FINDINGS There is no evidence of fracture or subluxation. The patient is status post anterior cervical spinal fusion at C3-C4, and posterior cervical spinal fusion at C7-T1. Small anterior and posterior disc osteophyte complexes are seen along the cervical spine. Vertebral bodies demonstrate normal height and alignment. Prevertebral soft tissues are within normal limits. The visualized portions of the thyroid gland are unremarkable in appearance. The visualized lung apices are clear. Prominent calcification is seen at the carotid bifurcations bilaterally, more prominent on the left, with  likely moderate luminal narrowing bilaterally. IMPRESSION: 1. No evidence of traumatic intracranial injury or fracture. 2. Small amount of blood within the right maxillary sinus, without definite evidence of an associated fracture on reconstructions. 3. No evidence of fracture or subluxation along the cervical spine. 4. Soft tissue swelling overlying the right frontal calvarium. 5. Mild cortical volume loss and scattered small vessel ischemic microangiopathy. 6. Chronic lacunar infarct at the right pons. 7.  Status post anterior cervical spinal fusion at C3-C4, and posterior cervical spinal fusion at C7-T1. Mild underlying degenerative change noted. 8. Prominent calcification at the carotid bifurcations bilaterally, more prominent on the left, with likely moderate luminal narrowing bilaterally. Carotid ultrasound is recommended for further evaluation, when and as deemed clinically appropriate. Electronically Signed   By: Roanna Raider M.D.   On: 10/02/2015 00:04   Ct Cervical Spine Wo Contrast  10/02/2015  CLINICAL DATA:  Status post fall at [facility] parking lot, with knot at the forehead. Concern for head or cervical spine injury. Initial encounter. EXAM: CT HEAD WITHOUT CONTRAST CT CERVICAL SPINE WITHOUT CONTRAST TECHNIQUE: Multidetector CT imaging of the head and cervical spine was performed following the standard protocol without intravenous contrast. Multiplanar CT image reconstructions of the cervical spine were also generated. COMPARISON:  CT of the head performed 11/13/2014, and MRI of the brain performed 10/29/2014. CT of the neck performed 02/20/2014 FINDINGS: CT HEAD FINDINGS There is no evidence of acute infarction, mass lesion, or intra- or extra-axial hemorrhage on CT. Prominence of the ventricles and sulci reflects mild cortical volume loss. Mild periventricular white matter change likely reflects small vessel ischemic microangiopathy. A chronic lacunar infarct is noted at the right pons. The fourth ventricle is within normal limits. The basal ganglia are unremarkable in appearance. The cerebral hemispheres demonstrate grossly normal gray-white differentiation. No mass effect or midline shift is seen. There is no evidence of fracture; visualized osseous structures are unremarkable in appearance. The orbits are within normal limits. There appears to be a small amount of blood within the right maxillary sinus. The remaining paranasal sinuses and mastoid air cells are well-aerated. Soft  tissue swelling is noted overlying the right frontal calvarium. CT CERVICAL SPINE FINDINGS There is no evidence of fracture or subluxation. The patient is status post anterior cervical spinal fusion at C3-C4, and posterior cervical spinal fusion at C7-T1. Small anterior and posterior disc osteophyte complexes are seen along the cervical spine. Vertebral bodies demonstrate normal height and alignment. Prevertebral soft tissues are within normal limits. The visualized portions of the thyroid gland are unremarkable in appearance. The visualized lung apices are clear. Prominent calcification is seen at the carotid bifurcations bilaterally, more prominent on the left, with likely moderate luminal narrowing bilaterally. IMPRESSION: 1. No evidence of traumatic intracranial injury or fracture. 2. Small amount of blood within the right maxillary sinus, without definite evidence of an associated fracture on reconstructions. 3. No evidence of fracture or subluxation along the cervical spine. 4. Soft tissue swelling overlying the right frontal calvarium. 5. Mild cortical volume loss and scattered small vessel ischemic microangiopathy. 6. Chronic lacunar infarct at the right pons. 7. Status post anterior cervical spinal fusion at C3-C4, and posterior cervical spinal fusion at C7-T1. Mild underlying degenerative change noted. 8. Prominent calcification at the carotid bifurcations bilaterally, more prominent on the left, with likely moderate luminal narrowing bilaterally. Carotid ultrasound is recommended for further evaluation, when and as deemed clinically appropriate. Electronically Signed   By: Beryle Beams.D.  On: 10/02/2015 00:04   Dg Femur, Min 2 Views Right  10/02/2015  CLINICAL DATA:  Trip and fall in a parking lot this evening. Mid right thigh pain. EXAM: RIGHT FEMUR 2 VIEWS COMPARISON:  None. FINDINGS: There is no evidence of fracture or other focal bone lesions. Hip and knee alignment is maintained. Vascular  calcifications are seen. Surgical clips in the medial soft tissues. IMPRESSION: No right femur fracture. Electronically Signed   By: Rubye Oaks M.D.   On: 10/02/2015 00:02    My personal review of EKG: Rhythm NSR, Rate   QTC 421   Assessment & Plan:    Active Problems:   Orthostatic hypotension   Syncope     1. Syncope- patient has positive orthostatic hypotension, will admit the patient for observation. Cycle cardiac enzymes. He has mild elevation of troponin 0.04. He was recently started on nadolol by PCP. Will consult cardiology for adjustment of his antihypertensive medications before discharge. 2. Chest pain- likely muscular from fall, EKG shows normal sinus rhythm with no ST changes. We'll cycle cardiac enzymes as above.  3. History of CAD, status post CABG and pacemaker- continue Plavix, Cardizem. 4. History of nonalcoholic liver cirrhosis- continue lactulose, nadolol, rifaximin 5.    DVT Prophylaxis-  Lovenox   Family Communication: Admission, patients condition and plan of care including tests being ordered have been discussed with the patient and *his wife at bedside* who indicate understanding and agree with the plan and Code Status.  Code Status:  Full code  Admission status:  Observation  Time spent in minutes : 45 min   Aftyn Nott S M.D on 10/02/2015 at 1:50 AM  Between 7am to 7pm - Pager - [phone]. After 7pm go to [email or web address] - password [facility]  [facility] - Office  [phone]

## 2015-10-02 NOTE — Progress Notes (Signed)
Patient seen and examined this morning, admitted overnight by Dr. Sharl Ma. H&P reviewed, and agree with the assessment and plan  In brief, this is a pleasant 70 year old gentleman with a history of coronary artery disease, symptomatically bradycardia status post pacemaker, hypertension, diabetes who had a syncopal episode yesterday while in the [employer] parking lot. This was preceded by dizziness prior. He was found to be significantly hypotensive and had positive orthostasis. He was just added on Nadolol as an outpatient 1-2 months ago   Syncope - patient has positive orthostatic hypotension, will admit the patient for observation.  - Cycle cardiac enzymes. He has mild elevation of troponin 0.04. He was recently started on nadolol by PCP.  - Will consult cardiology for adjustment of his antihypertensive medications before discharge.  Chest pain - likely muscular from fall, EKG shows normal sinus rhythm with no ST changes. We'll cycle cardiac enzymes as above.   History of CAD, status post CABG and pacemaker - continue Plavix, Cardizem. - Defer pacemaker interrogation to cardiology  History of nonalcoholic liver cirrhosis - continue lactulose, nadolol, rifaximin  Thrombocytopenia/anemia - Due to liver disease   [name] M. Elvera Lennox, MD [facility] [phone]

## 2015-10-02 NOTE — ED Notes (Signed)
Gave patient a meal and ginerale as per nurse and Dr. Sharl Ma

## 2015-10-03 ENCOUNTER — Observation Stay (HOSPITAL_COMMUNITY): Payer: Medicare Other

## 2015-10-03 DIAGNOSIS — J449 Chronic obstructive pulmonary disease, unspecified: Secondary | ICD-10-CM | POA: Diagnosis not present

## 2015-10-03 DIAGNOSIS — K7460 Unspecified cirrhosis of liver: Secondary | ICD-10-CM | POA: Diagnosis not present

## 2015-10-03 DIAGNOSIS — S060X1A Concussion with loss of consciousness of 30 minutes or less, initial encounter: Secondary | ICD-10-CM | POA: Diagnosis not present

## 2015-10-03 DIAGNOSIS — I5032 Chronic diastolic (congestive) heart failure: Secondary | ICD-10-CM | POA: Diagnosis not present

## 2015-10-03 DIAGNOSIS — R55 Syncope and collapse: Secondary | ICD-10-CM | POA: Diagnosis not present

## 2015-10-03 DIAGNOSIS — I25810 Atherosclerosis of coronary artery bypass graft(s) without angina pectoris: Secondary | ICD-10-CM

## 2015-10-03 DIAGNOSIS — J9611 Chronic respiratory failure with hypoxia: Secondary | ICD-10-CM

## 2015-10-03 DIAGNOSIS — I951 Orthostatic hypotension: Secondary | ICD-10-CM | POA: Diagnosis not present

## 2015-10-03 LAB — ECHOCARDIOGRAM COMPLETE
HEIGHTINCHES: 71 in
Weight: 3200 oz

## 2015-10-03 LAB — HEMOGLOBIN A1C
HEMOGLOBIN A1C: 7.8 % — AB (ref 4.8–5.6)
Mean Plasma Glucose: 177 mg/dL

## 2015-10-03 LAB — GLUCOSE, CAPILLARY
GLUCOSE-CAPILLARY: 135 mg/dL — AB (ref 65–99)
GLUCOSE-CAPILLARY: 259 mg/dL — AB (ref 65–99)

## 2015-10-03 MED ORDER — TORSEMIDE 20 MG PO TABS: 20.0000 mg | ORAL_TABLET | Freq: Every day | ORAL | Status: AC | PRN

## 2015-10-03 NOTE — Evaluation (Signed)
Physical Therapy Evaluation Patient Details Name: [name] MRN: [id number] DOB: [birth year] Today's Date: 10/03/2015   History of Present Illness  70 yo M admitted after syncopal episode where he passed out in the [employer] parking lot after getting out of his car. In the ED patient was found to be in orthostatic hypotension, blood pressure dropped to 84/54 on standing. Severely orthostatic in ER by vitals. Likely due to chronic diarrhea related to lactulose, poor oral intake, diuretic use, and combination of home beta blocker and CCB.  PMH: COPD, Elevated liver function tests, HTN, aortic stenosis, CAD, GERD, chronic A-flutter, S/P CABG x5, morbid obesity, coronary atherosclerosis of artery bypass graft, acute on chronic diastolic heart failure, chronic congestive heart failure, Epistaxis, cirrhosis, portal HTN, chronic pancreatitis, SOB, dizziness, asthma, anemia, s/p TAVR, DM 2, RA, DDD - spine, ARF, home O2 at night, and PRN, myositis of L thigh, A-fib, cardiac pacemaker, appendectomy, ACDF, lumbar disc surgery  Clinical Impression  Pt received in bed, and was agreeable to PT evaluation.  Pt states he lives with his wife, and normally only uses a cane for ambulation.  He is independent with ADL's.  Today during PT evaluation he was Mod (I) for bed mobility, and required supervision for transfers sit<>stand with RW, and Min guard for ambulation x 24ft with RW.  Gait distance limited due to pt c/o slight dizziness, as well as fatigue.  During PT evaluation orthostatic vitals were taken and reported as follows:  Supine: 102/52, HR: 60bpm Sitting: 82/50, HR: 63bpm,  Standing: 72/41, HR: 63bpm. Pt returned to seated position and instructed on performing LE HEP including ankle pumps, LAQ's, and hip flexion. Standing BP: 95/47, HR: 63bpm After ambulation: 117/55, HR: 68bpm Pt educated on importance of mobility with BP, as well as taking his time especially when changing position.  Pt expressed that  he "learned something today." Recommend pt f/u with HHPT upon d/c to ensure safety with balance, gait, and transfers at home.  Pt may need a RW as he has a standard walker at home.     Follow Up Recommendations Home health PT    Equipment Recommendations  Rolling walker with 5" wheels    Recommendations for Other Services       Precautions / Restrictions Precautions Precautions: Fall Precaution Comments: recent syncopal episode with fall Restrictions Weight Bearing Restrictions: No      Mobility  Bed Mobility Overal bed mobility: Modified Independent                Transfers Overall transfer level: Needs assistance Equipment used: Rolling walker (2 wheeled) Transfers: Sit to/from Stand Sit to Stand: Supervision            Ambulation/Gait Ambulation/Gait assistance: Min guard Ambulation Distance (Feet): 60 Feet Assistive device: Rolling walker (2 wheeled) Gait Pattern/deviations: Step-through pattern     General Gait Details: Limited due to fatigue, and pt reported feeling slightly dizzy  Stairs            Wheelchair Mobility    Modified Rankin (Stroke Patients Only)       Balance Overall balance assessment: Needs assistance         Standing balance support: Bilateral upper extremity supported Standing balance-Leahy Scale: Fair                               Pertinent Vitals/Pain Pain Assessment: 0-10 Pain Score: 6  Pain Location: LBP and L  knee Pain Descriptors / Indicators: Aching Pain Intervention(s): Limited activity within patient's tolerance    Home Living Family/patient expects to be discharged to:: Private residence Living Arrangements: Spouse/significant other   Type of Home: Mobile home Home Access: Stairs to enter Entrance Stairs-Rails: Right Entrance Stairs-Number of Steps: 8 Home Layout: One level Home Equipment: Walker - standard;Cane - single point;Bedside commode;Shower seat;Wheelchair - Oceanographer / Transfers Assistance Needed: Pt states he uses a cane for ambulation all the time  ADL's / Homemaking Assistance Needed: Pt states he is independent with ADL's, however recently, his wife does most of the driving.         Hand Dominance        Extremity/Trunk Assessment   Upper Extremity Assessment: Generalized weakness           Lower Extremity Assessment: Generalized weakness         Communication   Communication: HOH  Cognition Arousal/Alertness: Awake/alert Behavior During Therapy: WFL for tasks assessed/performed Overall Cognitive Status: Within Functional Limits for tasks assessed                      General Comments General comments (skin integrity, edema, etc.): Multiple abrasions over R UE, as well as R face/eye.  B LE's/feet are extremely cold.    Exercises General Exercises - Lower Extremity Ankle Circles/Pumps: AROM;Both;Other reps (comment);Seated;Other (comment) (for 1 min) Long Arc Quad: Strengthening;Both;20 reps;Seated;Other (comment) (limited range due to pain & weakness) Hip Flexion/Marching: Strengthening;Both;20 reps;Seated      Assessment/Plan    PT Assessment Patient needs continued PT services  PT Diagnosis Difficulty walking;Abnormality of gait;Generalized weakness   PT Problem List Decreased strength;Decreased range of motion;Decreased activity tolerance;Decreased balance;Decreased mobility;Decreased knowledge of use of DME;Decreased safety awareness;Decreased knowledge of precautions;Cardiopulmonary status limiting activity  PT Treatment Interventions DME instruction;Gait training;Stair training;Functional mobility training;Therapeutic activities;Therapeutic exercise;Balance training;Patient/family education   PT Goals (Current goals can be found in the Care Plan section) Acute Rehab PT Goals Patient Stated Goal: Pt wants to go home today.  PT Goal Formulation: With patient Time For Goal  Achievement: 10/10/15 Potential to Achieve Goals: Good    Frequency Min 3X/week   Barriers to discharge        Co-evaluation               End of Session Equipment Utilized During Treatment: Gait belt Activity Tolerance: Patient tolerated treatment well Patient left: in chair;with call bell/phone within reach Nurse Communication: Mobility status    Functional Assessment Tool Used: [employer] "6-clicks" Functional Limitation: Mobility: Walking and moving around Mobility: Walking and Moving Around Current Status [phone]): At least 20 percent but less than 40 percent impaired, limited or restricted Mobility: Walking and Moving Around Goal Status [phone]): At least 1 percent but less than 20 percent impaired, limited or restricted    Time: 2229-7989 PT Time Calculation (min) (ACUTE ONLY): 38 min   Charges:   PT Evaluation $PT Eval Moderate Complexity: 1 Procedure PT Treatments $Gait Training: 8-22 mins $Therapeutic Exercise: 8-22 mins   PT G Codes:   PT G-Codes **NOT FOR INPATIENT CLASS** Functional Assessment Tool Used: [employer] "6-clicks" Functional Limitation: Mobility: Walking and moving around Mobility: Walking and Moving Around Current Status [phone]): At least 20 percent but less than 40 percent impaired, limited or restricted Mobility: Walking and Moving Around Goal Status [phone]): At least 1 percent but less than 20  percent impaired, limited or restricted    Carollee Herter, PT, DPT X: [phone]   10/03/2015, 12:40 PM

## 2015-10-03 NOTE — Care Management Note (Signed)
Case Management Note  Patient Details  Name: [name] MRN: [id number] Date of Birth: 10/10/1945  Subjective/Objective:    Spoke with patient spouse at the bedside. PT has recommended HH and patient has had [facility] before.   Would like to continue. Referral placed with [facility] . Patient has walker at home.             Action/Plan:Hpome with Home Health PT RN   Expected Discharge Date:  10/03/15               Expected Discharge Plan:  Home/Self Care  In-House Referral:     Discharge planning Services  CM Consult  Post Acute Care Choice:  NA Choice offered to:  NA  DME Arranged:    DME Agency:     HH Arranged:    HH Agency:  [facility]  Status of Service:  Completed, signed off  Medicare Important Message Given:    Date Medicare IM Given:    Medicare IM give by:    Date Additional Medicare IM Given:    Additional Medicare Important Message give by:     If discussed at Long Length of Stay Meetings, dates discussed:    Additional Comments:  Adonis Huguenin, RN 10/03/2015, 1:56 PM

## 2015-10-03 NOTE — Progress Notes (Signed)
Subjective:    No complaints  Objective:   Temp:  [97.6 F (36.4 C)-98.4 F (36.9 C)] 97.6 F (36.4 C) (06/02 0500) Pulse Rate:  [59-60] 60 (06/01 1523) Resp:  [16-22] 18 (06/02 0500) BP: (103-127)/(61-67) 112/63 mmHg (06/01 1523) SpO2:  [93 %-100 %] 97 % (06/02 0740) Weight:  [200 lb (90.719 kg)] 200 lb (90.719 kg) (06/01 1144) Last BM Date: 10/02/15  Filed Weights   10/01/15 2101 10/02/15 1144  Weight: 200 lb (90.719 kg) 200 lb (90.719 kg)    Intake/Output Summary (Last 24 hours) at 10/03/15 0908 Last data filed at 10/03/15 0500  Gross per 24 hour  Intake    480 ml  Output    500 ml  Net    -20 ml    Telemetry: ventricular paced at 60  Exam:  General: NAD  HEENT: sclera clear, throat clear  Resp: CTAB  Cardiac: RRR, no m/r/g, no jv  PP:HKFEXMD soft, nt, nd  MSK:no LE edema  Neuro: no focal deficits  Psych: appropriate affect  Lab Results:  Basic Metabolic Panel:  Recent Labs Lab 10/01/15 2209 10/02/15 0557  NA 135 134*  K 3.6 3.5  CL 100* 102  CO2 29 27  GLUCOSE 96 172*  BUN 15 16  CREATININE 1.00 0.91  CALCIUM 8.7* 8.3*    Liver Function Tests:  Recent Labs Lab 10/02/15 0557  AST 65*  ALT 56  ALKPHOS 130*  BILITOT 1.4*  PROT 6.1*  ALBUMIN 2.3*    CBC:  Recent Labs Lab 10/01/15 2209 10/02/15 0557  WBC 4.8 4.6  HGB 12.5* 11.3*  HCT 37.8* 33.5*  MCV 96.9 96.8  PLT 77* 63*    Cardiac Enzymes:  Recent Labs Lab 10/02/15 0152 10/02/15 0745 10/02/15 1330  TROPONINI 0.04* 0.05* 0.03    BNP: No results for input(s): PROBNP in the last 8760 hours.  Coagulation: No results for input(s): INR in the last 168 hours.  ECG:   Medications:   Scheduled Medications: . clopidogrel  75 mg Oral Daily  . DULoxetine  60 mg Oral Daily  . enoxaparin (LOVENOX) injection  40 mg Subcutaneous Q24H  . ezetimibe  10 mg Oral QHS  . insulin aspart  0-9 Units Subcutaneous TID WC  . insulin aspart protamine- aspart  15 Units  Subcutaneous BID WC  . ipratropium-albuterol  3 mL Nebulization BID  . loratadine  10 mg Oral BH-q7a  . nadolol  20 mg Oral Daily  . predniSONE  5 mg Oral Q breakfast  . rifaximin  550 mg Oral BID     Infusions: . sodium chloride 100 mL/hr at 10/03/15 0102     PRN Medications:  acetaminophen **OR** acetaminophen, ipratropium-albuterol, lactulose, ondansetron **OR** ondansetron (ZOFRAN) IV, oxyCODONE-acetaminophen     Assessment/Plan   1. Orthostatic syncope - severely orthostatic in ER by vitals. Likely due to chronic diarrhea related to lactulose, poor oral intake, diuretic use, and combination of home beta blocker and CCB - in setting of syncope his pacemaker was checked and showed normal function. In setting of his AVR repeat echo is pending. - would change his home torsemide to prn only. His dilt has been discontinued, follow his afib on nadolol alone.   2. Afib - will stop dilt in setting of orthostatic syncope, continue nadolol for combined benefit for afib and esophageal varices - has not been on anticoag due to history of liver disease and esophageal varices   We will follow up echo results. No  further plans for inpatient cardiac testing, would anticipate likely discharge later today with medication changes as described above.     Dina Rich, M.D.

## 2015-10-03 NOTE — Discharge Summary (Signed)
Physician Discharge Summary  [name] FVC:[id number] DOB: [birth year] DOA: 10/01/2015  PCP: Donzetta Sprung, MD  Admit date: 10/01/2015 Discharge date: 10/03/2015  Time spent: > 30 minutes  Recommendations for Outpatient Follow-up:  1. Follow up with Dr. 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 This was preceded by dizziness prior. He was found to be significantly hypotensive and had positive orthostasis  Hospital Course:  Syncope - patient has positive orthostatic hypotension. Given extensive cardiac history cardiology was consulted and have followed patient while hospitalized. Cardiac enzymes flat and not in a pattern consistent with ACS. His Diltiazem has been discontinued on discharge and his diuretics were changed to prn per cardiology. He underwent a 2D echo which showed normal EF without WMA Chest pain - likely muscular from fall, EKG shows normal sinus rhythm with no ST changes.  History of CAD, status post CABG and pacemaker -  pacemaker interrogated per cardiology. History of nonalcoholic liver cirrhosis - continue lactulose, nadolol, rifaximin Thrombocytopenia/anemia - Due to liver disease  Procedures:  2D echo Study Conclusions - Left ventricle: The cavity size was normal. Wall thickness was increased in a pattern of moderate LVH. Systolic function was normal. The estimated ejection fraction was in the range of 60% to 65%. Wall motion was normal; there were no regional wall motion abnormalities. The study is not technically sufficient to allow evaluation of LV diastolic function. - Aortic valve: Edwards Sapien 3 Transcatheter Heart Valve (size 29 mm, model # 9600TFX, serial # [id number]) is in the AV position. There was no stenosis. There was no significant regurgitation. - Mitral valve: Moderately calcified annulus. Mildly thickened leaflets . - Left atrium: The atrium was moderately dilated. - Technically adequate study.   Consultations:  Cardiology   Discharge Exam: Filed Vitals:   10/03/15 0500 10/03/15 0740 10/03/15 1045 10/03/15 1110  BP:   95/47 117/55  Pulse:   63 68  Temp: 97.6 F (36.4 C)     TempSrc: Oral     Resp: 18     Height:      Weight:      SpO2: 100% 97% 94% 95%    General: NAD Cardiovascular: RRR Respiratory: CTA biL  Discharge Instructions Activity:  As tolerated   Get Medicines reviewed and adjusted: Please take all your medications with you for your next visit with your Primary MD  Please request your Primary MD to go over all hospital tests and procedure/radiological results at the follow up, please ask your Primary MD to get all Hospital records sent to  his/her office.  If you experience worsening of your admission symptoms, develop shortness of breath, life threatening emergency, suicidal or homicidal thoughts you must seek medical attention immediately by calling 911 or calling your MD immediately if symptoms less severe.  You must read complete  instructions/literature along with all the possible adverse reactions/side effects for all the Medicines you take and that have been prescribed to you. Take any new Medicines after you have completely understood and accpet all the possible adverse reactions/side effects.   Do not drive when taking Pain medications.   Do not take more than prescribed Pain, Sleep and Anxiety Medications  Special Instructions: If you have smoked or chewed Tobacco in the last 2 yrs please stop smoking, stop any regular Alcohol and or any Recreational drug use.  Wear Seat belts while driving.  Please note  You were cared for by a hospitalist during your hospital stay. Once you are discharged, your primary care physician will handle any further medical issues. Please note that NO REFILLS for any discharge medications will be authorized once you are discharged, as it is imperative that you return to your primary care physician (or establish a relationship with a primary care physician if you do not have one) for your aftercare needs so that they can reassess your need for medications and monitor your lab values.    Medication List    STOP taking these medications        diltiazem 120 MG 24 hr capsule  Commonly known as:  CARDIZEM CD      TAKE these medications        ascorbic acid 500 MG tablet  Commonly known as:  VITAMIN C  Take 500 mg by mouth 2 (two) times daily.     aspirin 81 MG tablet  Take 1 tablet (81 mg total) by mouth daily.     cetirizine 10 MG tablet  Commonly known as:  ZYRTEC  Take 10 mg by mouth at bedtime.     clopidogrel 75 MG tablet  Commonly known as:  PLAVIX  Take 1 tablet (75 mg total) by mouth daily.     co-enzyme Q-10 30 MG capsule  Take 30 mg by mouth at bedtime.     ipratropium-albuterol 0.5-2.5 (3) MG/3ML Soln  Commonly known as:  DUONEB  Take 3 mLs by nebulization every 6 (six) hours as needed (for shortness of breath).     COMBIVENT 18-103 MCG/ACT inhaler    Generic drug:  albuterol-ipratropium  Inhale 2 puffs into the lungs every 6 (six) hours as needed for wheezing or shortness of breath.     CRESTOR 5 MG tablet  Generic drug:  rosuvastatin  Take 5 mg by mouth every other day. 3 day weekly     desonide 0.05 % cream  Commonly known as:  DESOWEN  Apply 1 application topically 2 (two) times daily as needed (for rash).     DRY EYES OP  Apply 1 drop to eye daily as needed (dry eyes).     DULoxetine 60 MG capsule  Commonly known as:  CYMBALTA  Take 60 mg by mouth daily.     ezetimibe 10 MG tablet  Commonly known as:  ZETIA  Take 10 mg by mouth at bedtime.     flunisolide 29 MCG/ACT (0.025%) nasal spray  Commonly known as:  NASAREL  Place 2 sprays into the nose at bedtime as needed for rhinitis (for congestion). Dose is for each nostril.     insulin  NPH-regular Human (70-30) 100 UNIT/ML injection  Commonly known as:  NOVOLIN 70/30  Inject 15 Units into the skin 2 (two) times daily.     ketoconazole 2 % cream  Commonly known as:  NIZORAL  Apply 1 application topically 2 (two) times daily as needed for irritation.     KRISTALOSE 20 g packet  Generic drug:  lactulose  Take 10 g by mouth 2 (two) times daily as needed (diarrhea).     loratadine 10 MG tablet  Commonly known as:  CLARITIN  Take 10 mg by mouth every morning.     multivitamin tablet  Take 1 tablet by mouth daily.     nadolol 20 MG tablet  Commonly known as:  CORGARD  Take 1 tablet (20 mg total) by mouth daily.     oxyCODONE-acetaminophen 5-325 MG tablet  Commonly known as:  PERCOCET/ROXICET  Take 1-2 tablets by mouth every 4 (four) hours as needed for moderate pain.     pantoprazole 40 MG tablet  Commonly known as:  PROTONIX  1 po 30 mins prior to first meal     potassium chloride SA 20 MEQ tablet  Commonly known as:  K-DUR,KLOR-CON  Take 1 tablet (20 mEq total) by mouth 2 (two) times daily.     predniSONE 5 MG tablet  Commonly known as:  DELTASONE   Take 5 mg by mouth daily with breakfast. Reported on 09/23/2015     rifaximin 550 MG Tabs tablet  Commonly known as:  XIFAXAN  Take 550 mg by mouth 2 (two) times daily.     torsemide 20 MG tablet  Commonly known as:  DEMADEX  Take 1 tablet (20 mg total) by mouth daily as needed. For edema / weight gain         The results of significant diagnostics from this hospitalization (including imaging, microbiology, ancillary and laboratory) are listed below for reference.    Significant Diagnostic Studies: Dg Chest 1 View  10/02/2015  CLINICAL DATA:  Trip and fall in a parking lot this evening. Anterior chest pain. EXAM: CHEST 1 VIEW COMPARISON:  02/28/2015 FINDINGS: Post median sternotomy and CABG and aortic valve rib. Single lead pacemaker remains in place with lead projecting over the right ventricle. Heart is normal in size. Chronic interstitial coarsening. No pneumothorax. No large pleural effusion or focal airspace disease. Remote right rib fractures are again seen. No acute osseous abnormality. Postsurgical change in the cervical spine, partially included. IMPRESSION: No acute process. Electronically Signed   By: Rubye Oaks M.D.   On: 10/02/2015 00:04   Ct Head Wo Contrast  10/02/2015  CLINICAL DATA:  Status post fall at [facility] parking lot, with knot at the forehead. Concern for head or cervical spine injury. Initial encounter. EXAM: CT HEAD WITHOUT CONTRAST CT CERVICAL SPINE WITHOUT CONTRAST TECHNIQUE: Multidetector CT imaging of the head and cervical spine was performed following the standard protocol without intravenous contrast. Multiplanar CT image reconstructions of the cervical spine were also generated. COMPARISON:  CT of the head performed 11/13/2014, and MRI of the brain performed 10/29/2014. CT of the neck performed 02/20/2014 FINDINGS: CT HEAD FINDINGS There is no evidence of acute infarction, mass lesion, or intra- or extra-axial hemorrhage on CT. Prominence of  the ventricles and sulci reflects mild cortical volume loss. Mild periventricular white matter change likely reflects small vessel ischemic microangiopathy. A chronic lacunar infarct is noted at the right pons. The fourth ventricle is within normal limits. The basal ganglia are  unremarkable in appearance. The cerebral hemispheres demonstrate grossly normal gray-white differentiation. No mass effect or midline shift is seen. There is no evidence of fracture; visualized osseous structures are unremarkable in appearance. The orbits are within normal limits. There appears to be a small amount of blood within the right maxillary sinus. The remaining paranasal sinuses and mastoid air cells are well-aerated. Soft tissue swelling is noted overlying the right frontal calvarium. CT CERVICAL SPINE FINDINGS There is no evidence of fracture or subluxation. The patient is status post anterior cervical spinal fusion at C3-C4, and posterior cervical spinal fusion at C7-T1. Small anterior and posterior disc osteophyte complexes are seen along the cervical spine. Vertebral bodies demonstrate normal height and alignment. Prevertebral soft tissues are within normal limits. The visualized portions of the thyroid gland are unremarkable in appearance. The visualized lung apices are clear. Prominent calcification is seen at the carotid bifurcations bilaterally, more prominent on the left, with likely moderate luminal narrowing bilaterally. IMPRESSION: 1. No evidence of traumatic intracranial injury or fracture. 2. Small amount of blood within the right maxillary sinus, without definite evidence of an associated fracture on reconstructions. 3. No evidence of fracture or subluxation along the cervical spine. 4. Soft tissue swelling overlying the right frontal calvarium. 5. Mild cortical volume loss and scattered small vessel ischemic microangiopathy. 6. Chronic lacunar infarct at the right pons. 7. Status post anterior cervical spinal fusion  at C3-C4, and posterior cervical spinal fusion at C7-T1. Mild underlying degenerative change noted. 8. Prominent calcification at the carotid bifurcations bilaterally, more prominent on the left, with likely moderate luminal narrowing bilaterally. Carotid ultrasound is recommended for further evaluation, when and as deemed clinically appropriate. Electronically Signed   By: Roanna Raider M.D.   On: 10/02/2015 00:04   Ct Cervical Spine Wo Contrast  10/02/2015  CLINICAL DATA:  Status post fall at [facility] parking lot, with knot at the forehead. Concern for head or cervical spine injury. Initial encounter. EXAM: CT HEAD WITHOUT CONTRAST CT CERVICAL SPINE WITHOUT CONTRAST TECHNIQUE: Multidetector CT imaging of the head and cervical spine was performed following the standard protocol without intravenous contrast. Multiplanar CT image reconstructions of the cervical spine were also generated. COMPARISON:  CT of the head performed 11/13/2014, and MRI of the brain performed 10/29/2014. CT of the neck performed 02/20/2014 FINDINGS: CT HEAD FINDINGS There is no evidence of acute infarction, mass lesion, or intra- or extra-axial hemorrhage on CT. Prominence of the ventricles and sulci reflects mild cortical volume loss. Mild periventricular white matter change likely reflects small vessel ischemic microangiopathy. A chronic lacunar infarct is noted at the right pons. The fourth ventricle is within normal limits. The basal ganglia are unremarkable in appearance. The cerebral hemispheres demonstrate grossly normal gray-white differentiation. No mass effect or midline shift is seen. There is no evidence of fracture; visualized osseous structures are unremarkable in appearance. The orbits are within normal limits. There appears to be a small amount of blood within the right maxillary sinus. The remaining paranasal sinuses and mastoid air cells are well-aerated. Soft tissue swelling is noted overlying the right  frontal calvarium. CT CERVICAL SPINE FINDINGS There is no evidence of fracture or subluxation. The patient is status post anterior cervical spinal fusion at C3-C4, and posterior cervical spinal fusion at C7-T1. Small anterior and posterior disc osteophyte complexes are seen along the cervical spine. Vertebral bodies demonstrate normal height and alignment. Prevertebral soft tissues are within normal limits. The visualized portions of the thyroid gland are  unremarkable in appearance. The visualized lung apices are clear. Prominent calcification is seen at the carotid bifurcations bilaterally, more prominent on the left, with likely moderate luminal narrowing bilaterally. IMPRESSION: 1. No evidence of traumatic intracranial injury or fracture. 2. Small amount of blood within the right maxillary sinus, without definite evidence of an associated fracture on reconstructions. 3. No evidence of fracture or subluxation along the cervical spine. 4. Soft tissue swelling overlying the right frontal calvarium. 5. Mild cortical volume loss and scattered small vessel ischemic microangiopathy. 6. Chronic lacunar infarct at the right pons. 7. Status post anterior cervical spinal fusion at C3-C4, and posterior cervical spinal fusion at C7-T1. Mild underlying degenerative change noted. 8. Prominent calcification at the carotid bifurcations bilaterally, more prominent on the left, with likely moderate luminal narrowing bilaterally. Carotid ultrasound is recommended for further evaluation, when and as deemed clinically appropriate. Electronically Signed   By: Roanna Raider M.D.   On: 10/02/2015 00:04   Dg Femur, Min 2 Views Right  10/02/2015  CLINICAL DATA:  Trip and fall in a parking lot this evening. Mid right thigh pain. EXAM: RIGHT FEMUR 2 VIEWS COMPARISON:  None. FINDINGS: There is no evidence of fracture or other focal bone lesions. Hip and knee alignment is maintained. Vascular calcifications are seen. Surgical clips in the  medial soft tissues. IMPRESSION: No right femur fracture. Electronically Signed   By: Rubye Oaks M.D.   On: 10/02/2015 00:02    Microbiology: Recent Results (from the past 240 hour(s))  MRSA PCR Screening     Status: None   Collection Time: 10/02/15 12:05 PM  Result Value Ref Range Status   MRSA by PCR NEGATIVE NEGATIVE Final    Comment:        The GeneXpert MRSA Assay (FDA approved for NASAL specimens only), is one component of a comprehensive MRSA colonization surveillance program. It is not intended to diagnose MRSA infection nor to guide or monitor treatment for MRSA infections.      Labs: Basic Metabolic Panel:  Recent Labs Lab 10/01/15 2209 10/02/15 0557  NA 135 134*  K 3.6 3.5  CL 100* 102  CO2 29 27  GLUCOSE 96 172*  BUN 15 16  CREATININE 1.00 0.91  CALCIUM 8.7* 8.3*   Liver Function Tests:  Recent Labs Lab 10/02/15 0557  AST 65*  ALT 56  ALKPHOS 130*  BILITOT 1.4*  PROT 6.1*  ALBUMIN 2.3*   No results for input(s): LIPASE, AMYLASE in the last 168 hours. No results for input(s): AMMONIA in the last 168 hours. CBC:  Recent Labs Lab 10/01/15 2209 10/02/15 0557  WBC 4.8 4.6  HGB 12.5* 11.3*  HCT 37.8* 33.5*  MCV 96.9 96.8  PLT 77* 63*   Cardiac Enzymes:  Recent Labs Lab 10/01/15 2209 10/02/15 0152 10/02/15 0745 10/02/15 1330  TROPONINI 0.04* 0.04* 0.05* 0.03   BNP: BNP (last 3 results)  Recent Labs  10/28/14 2250  BNP 76.0    CBG:  Recent Labs Lab 10/02/15 1149 10/02/15 1648 10/02/15 2056 10/03/15 0745 10/03/15 1121  GLUCAP 107* 145* 190* 135* 259*       Signed:  Lizania Bouchard  [facility] 10/03/2015, 1:43 PM

## 2015-10-03 NOTE — Discharge Instructions (Signed)
Follow with Donzetta Sprung, MD in 5-7 days  Please get a complete blood count and chemistry panel checked by your Primary MD at your next visit, and again as instructed by your Primary MD. Please get your medications reviewed and adjusted by your Primary MD.  Please request your Primary MD to go over all Hospital Tests and Procedure/Radiological results at the follow up, please get all Hospital records sent to your Prim MD by signing hospital release before you go home.  If you had Pneumonia of Lung problems at the Hospital: Please get a 2 view Chest X ray done in 6-8 weeks after hospital discharge or sooner if instructed by your Primary MD.  If you have Congestive Heart Failure: Please call your Cardiologist or Primary MD anytime you have any of the following symptoms:  1) 3 pound weight gain in 24 hours or 5 pounds in 1 week  2) shortness of breath, with or without a dry hacking cough  3) swelling in the hands, feet or stomach  4) if you have to sleep on extra pillows at night in order to breathe  Follow cardiac low salt diet and 1.5 lit/day fluid restriction.  If you have diabetes Accuchecks 4 times/day, Once in AM empty stomach and then before each meal. Log in all results and show them to your primary doctor at your next visit. If any glucose reading is under 80 or above 300 call your primary MD immediately.  If you have Seizure/Convulsions/Epilepsy: Please do not drive, operate heavy machinery, participate in activities at heights or participate in high speed sports until you have seen by Primary MD or a Neurologist and advised to do so again.  If you had Gastrointestinal Bleeding: Please ask your Primary MD to check a complete blood count within one week of discharge or at your next visit. Your endoscopic/colonoscopic biopsies that are pending at the time of discharge, will also need to followed by your Primary MD.  Get Medicines reviewed and adjusted. Please take all your  medications with you for your next visit with your Primary MD  Please request your Primary MD to go over all hospital tests and procedure/radiological results at the follow up, please ask your Primary MD to get all Hospital records sent to his/her office.  If you experience worsening of your admission symptoms, develop shortness of breath, life threatening emergency, suicidal or homicidal thoughts you must seek medical attention immediately by calling 911 or calling your MD immediately  if symptoms less severe.  You must read complete instructions/literature along with all the possible adverse reactions/side effects for all the Medicines you take and that have been prescribed to you. Take any new Medicines after you have completely understood and accpet all the possible adverse reactions/side effects.   Do not drive or operate heavy machinery when taking Pain medications.   Do not take more than prescribed Pain, Sleep and Anxiety Medications  Special Instructions: If you have smoked or chewed Tobacco  in the last 2 yrs please stop smoking, stop any regular Alcohol  and or any Recreational drug use.  Wear Seat belts while driving.  Please note You were cared for by a hospitalist during your hospital stay. If you have any questions about your discharge medications or the care you received while you were in the hospital after you are discharged, you can call the unit and asked to speak with the hospitalist on call if the hospitalist that took care of you is not available. Once  you are discharged, your primary care physician will handle any further medical issues. Please note that NO REFILLS for any discharge medications will be authorized once you are discharged, as it is imperative that you return to your primary care physician (or establish a relationship with a primary care physician if you do not have one) for your aftercare needs so that they can reassess your need for medications and monitor your  lab values.  You can reach the hospitalist office at phone [phone] or fax [phone]   If you do not have a primary care physician, you can call [phone] for a physician referral.  Activity: As tolerated with Full fall precautions use walker/cane & assistance as needed  Diet: heart healthy  Disposition Home

## 2015-10-10 ENCOUNTER — Encounter: Payer: Self-pay | Admitting: Adult Health

## 2015-10-10 ENCOUNTER — Ambulatory Visit (INDEPENDENT_AMBULATORY_CARE_PROVIDER_SITE_OTHER): Payer: Medicare Other | Admitting: Adult Health

## 2015-10-10 VITALS — BP 106/62 | HR 76 | Ht 71.0 in | Wt 218.0 lb

## 2015-10-10 DIAGNOSIS — I350 Nonrheumatic aortic (valve) stenosis: Secondary | ICD-10-CM

## 2015-10-10 DIAGNOSIS — I951 Orthostatic hypotension: Secondary | ICD-10-CM

## 2015-10-10 DIAGNOSIS — I2510 Atherosclerotic heart disease of native coronary artery without angina pectoris: Secondary | ICD-10-CM

## 2015-10-10 MED ORDER — MIDODRINE HCL 2.5 MG PO TABS: 2.5000 mg | ORAL_TABLET | Freq: Every day | ORAL | Status: AC

## 2015-10-10 NOTE — Patient Instructions (Addendum)
Your physician recommends that you schedule a follow-up appointment in: 2 weeks for a Blood Pressure Check   Your physician recommends that you schedule a follow-up appointment in: 1 Month with Dr. Purvis Sheffield.  Your physician has recommended you make the following change in your medication:   Start Taking Midodrine 2.5 mg Daily  If you need a refill on your cardiac medications before your next appointment, please call your pharmacy.  Thank you for choosing [facility]!

## 2015-10-10 NOTE — Progress Notes (Signed)
Cardiology Office Note   Date:  10/10/2015   ID:  [name], DOB 01/27/1946, MRN [id number]  PCP:  Donzetta Sprung, MD  Cardiologist: Inis Sizer, NP   No chief complaint on file.     History of Present Illness: [name] is a 70 y.o. male who presents for ongoing assessment and management of chronic diastolic heart failure, paroxysmal atrial fibrillation, found to be a poor candidate for anticoagulation due to portal hypertension, esophageal varices, but remains on Plavix and aspirin. Patient has a history of CAD with CABG and TAVR, symptomatic bradycardia with pacemaker in situ followed by Dr. Ladona Ridgel.  Was consultation [facility] after presenting with a syncopal episode. Echocardiogram revealed normal LV systolic function with EF of 60-65%, moderate LVH.Aortic valve: Edwards Sapien 3 Transcatheter Heart Valve (size 29 mm, model # 9600TFX, serial # [id number]) is in the AV position. There was no stenosis. There was no significant regurgitation.pacemaker was interrogated revealing normal function. His torsemide was changed to when necessary only. Diltiazem was discontinued, and he was to continue on nadolol alone.  He comes today feeling better and has been taken off of diltiazem, diuretics, and nadolol. He continues to have episodes of dizziness especially with position change. He is not driving. He has not passed out since being seen last.  Past Medical History  Diagnosis Date  . COPD (chronic obstructive pulmonary disease) (HCC)   . Elevated liver function tests   . Hypertension   . Aortic stenosis     a. 2D ECHO: 01/30/2014: ef 50-55%. No RWMAs. Severe OM:VEHMC area (VTI): 0.82 cm^2. Valve area (Vmax): 0.74 cm^2. Valve area (Vmean): 0.75 Cm^2. Mod LA/RA dilation. PA peak pressure 33  . CAD (coronary artery disease)   . GERD (gastroesophageal reflux disease)   . Chronic atrial flutter (HCC)   . 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 Morbid obesity (HCC)   . Coronary atherosclerosis of artery bypass graft 2010  . Acute on chronic diastolic heart failure (HCC) 02/01/2014  . Chronic diastolic congestive heart failure (HCC) 02/01/2014  . Epistaxis     Developed on Xarelto therapy  . Cirrhosis (HCC) 02/01/2014  . Portal hypertension (HCC) 02/01/2014  . Chronic pancreatitis (HCC) 02/01/2014    Noted on CT scan  . Shortness of breath   . Dizziness   . Heart murmur   . Asthma     uses nebulizers daily  . Dysrhythmia     fib/flutter  . Anemia     was on xalerto sept 2015 for afib - was taken off of - had to get unit of prbc  . History of blood transfusion 01/2014    "right after starting Xeralto"  . 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 Hyperlipidemia   . Pneumonia "several times:  . Type II diabetes mellitus (HCC)   . Rheumatoid arthritis(714.0)     "everywhere"  . Degenerative disc disease, cervical   . Degenerative disc disease, lumbar 2010  . Osteoarthritis     "everywhere"  . Chronic back pain     "lower back usually; upper back fior the last couple weeks"  (12/26/2014)  . ARF (acute renal failure) (HCC) 11/2014  . On home oxygen therapy     "  2.5L at night & prn during the day" (12/26/2014)  . Other myositis, left thigh dx'd 12/26/2014  . Acute renal failure (HCC) 12/30/2014  . Atrial fibrillation (HCC) 12/30/2014  . 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 Hyposmolality and/or hyponatremia 12/30/2014  . CHB (complete heart block) (HCC) 12/30/2014  . Elevated troponin level, secondary to CHB acute renal failure 12/30/2014  . Complication of anesthesia     "has a hard time breathing when he wakes up"    Past Surgical History  Procedure Laterality Date  . Coronary angioplasty with stent  placement  01/02/2009    s/p bare metal stent to the vein graft to the RCA 9/20. nml LV function   . Appendectomy  1950  . Coronary artery bypass graft  03/27/1997    CABG x 5 by Dr Cornelius Moras  . Anterior cervical decomp/discectomy fusion  12/2006  . Tonsillectomy and adenoidectomy  1950  . Lumbar disc surgery  2011    "put plastic wedge in"  . Esophagogastroduodenoscopy (egd) with propofol N/A 02/01/2014    Dr. Bosie Clos: small distal esophageal varices and mild portal gastropathy  . Colonoscopy with propofol N/A 02/01/2014    Sigmoid diverticulosis   . Multiple extractions with alveoloplasty N/A 03/12/2014    Procedure: MULTIPLE EXTRACTION OF TOOTH #'S 2,6,7,8,9,10,11,14,18,20,29,31 WITH ALVEOLOPLASTY AND GROSS DEBRIDMENT OF REMAINING TEETH;  Surgeon: Charlynne Pander, DDS;  Location: [facility] OR;  Service: Oral Surgery;  Laterality: N/A;  . Back surgery    . Transcatheter aortic valve replacement, transfemoral N/A 04/03/2014    Procedure: TRANSCATHETER AORTIC VALVE REPLACEMENT, TRANSFEMORAL;  Surgeon: Purcell Nails, MD;  Location: [facility] OR;  Service: Open Heart Surgery;  Laterality: N/A;  . Intraoperative transesophageal echocardiogram N/A 04/03/2014    Procedure: INTRAOPERATIVE TRANSESOPHAGEAL ECHOCARDIOGRAM;  Surgeon: Purcell Nails, MD;  Location: [facility] OR;  Service: Open Heart Surgery;  Laterality: N/A;  . Left and right heart catheterization with coronary/graft angiogram N/A 02/05/2014    Procedure: LEFT AND RIGHT HEART CATHETERIZATION WITH Isabel Caprice;  Surgeon: Peter M Swaziland, MD;  Location: [facility] CATH LAB;  Service: Cardiovascular;  Laterality: N/A;  . Posterior laminectomy / decompression cervical spine  05/2007  . Ep implantable device N/A 12/27/2014    Procedure: Pacemaker Implant;  Surgeon: Marinus Maw, MD;  Location: [facility] INVASIVE CV LAB;  Service: Cardiovascular;  Laterality: N/A;  . Insert / replace / remove pacemaker    . Esophagogastroduodenoscopy (egd) with propofol N/A 07/22/2015     Dr. Darrick Penna: grade 1 varices, portal gastropathy, Nadolol started. Repeat EGD in 1-2 years to reassess varices and possible banding   . Savory dilation N/A 07/22/2015    Procedure: SAVORY DILATION;  Surgeon: West Bali, MD;  Location: [facility] ENDO SUITE;  Service: Endoscopy;  Laterality: N/A;     Current Outpatient Prescriptions  Medication Sig Dispense Refill  . albuterol-ipratropium (COMBIVENT) 18-103 MCG/ACT inhaler Inhale 2 puffs into the lungs every 6 (six) hours as needed for wheezing or shortness of breath.     . Artificial Tear Ointment (DRY EYES OP) Apply 1 drop to eye daily as needed (dry eyes).    Marland Kitchen ascorbic acid (VITAMIN C) 500 MG tablet Take 500 mg by mouth 2 (two) times daily.     Marland Kitchen aspirin 81 MG tablet Take 1 tablet (81 mg total) by mouth daily.    . cetirizine (ZYRTEC) 10 MG tablet Take 10 mg by mouth at bedtime.    . clopidogrel (PLAVIX) 75  MG tablet Take 1 tablet (75 mg total) by mouth daily. 90 tablet 3  . co-enzyme Q-10 30 MG capsule Take 30 mg by mouth at bedtime.     . CRESTOR 5 MG tablet Take 5 mg by mouth every other day. 3 day weekly    . desonide (DESOWEN) 0.05 % cream Apply 1 application topically 2 (two) times daily as needed (for rash).     . DULoxetine (CYMBALTA) 60 MG capsule Take 60 mg by mouth daily.    Marland Kitchen ezetimibe (ZETIA) 10 MG tablet Take 10 mg by mouth at bedtime.     . flunisolide (NASAREL) 29 MCG/ACT (0.025%) nasal spray Place 2 sprays into the nose at bedtime as needed for rhinitis (for congestion). Dose is for each nostril.    . insulin NPH-regular Human (NOVOLIN 70/30) (70-30) 100 UNIT/ML injection Inject 15 Units into the skin 2 (two) times daily. (Patient taking differently: Inject 15-20 Units into the skin 2 (two) times daily. 40 as a base; <180 = 40 units) 10 mL 11  . ipratropium-albuterol (DUONEB) 0.5-2.5 (3) MG/3ML SOLN Take 3 mLs by nebulization every 6 (six) hours as needed (for shortness of breath).    Marland Kitchen ketoconazole (NIZORAL) 2 % cream Apply 1  application topically 2 (two) times daily as needed for irritation.     Marland Kitchen KRISTALOSE 20 G packet Take 10 g by mouth 2 (two) times daily as needed (diarrhea).     . loratadine (CLARITIN) 10 MG tablet Take 10 mg by mouth every morning.     . Multiple Vitamin (MULTIVITAMIN) tablet Take 1 tablet by mouth daily.      Marland Kitchen oxyCODONE-acetaminophen (PERCOCET/ROXICET) 5-325 MG per tablet Take 1-2 tablets by mouth every 4 (four) hours as needed for moderate pain. (Patient taking differently: Take 1 tablet by mouth every 4 (four) hours as needed for moderate pain. ) 40 tablet 0  . pantoprazole (PROTONIX) 40 MG tablet 1 po 30 mins prior to first meal (Patient taking differently: Take 40 mg by mouth daily as needed (acid reflux). ) 30 tablet 11  . potassium chloride SA (K-DUR,KLOR-CON) 20 MEQ tablet Take 1 tablet (20 mEq total) by mouth 2 (two) times daily. 180 tablet 3  . predniSONE (DELTASONE) 5 MG tablet Take 5 mg by mouth daily with breakfast. Reported on 09/23/2015    . rifaximin (XIFAXAN) 550 MG TABS tablet Take 550 mg by mouth 2 (two) times daily.    Marland Kitchen torsemide (DEMADEX) 20 MG tablet Take 1 tablet (20 mg total) by mouth daily as needed. For edema / weight gain (Patient not taking: Reported on 10/10/2015) 30 tablet 0   No current facility-administered medications for this visit.    Allergies:   Xarelto    Social History:  The patient  reports that he quit smoking about 18 years ago. His smoking use included Cigarettes. He started smoking about 54 years ago. He has a 105 pack-year smoking history. His smokeless tobacco use includes Chew. He reports that he does not drink alcohol or use illicit drugs.   Family History:  The patient's family history includes Asthma in his child; Cancer in his father; Emphysema in his brother; Hypertension in his mother; Liver cancer in his paternal grandfather; Rheum arthritis in his maternal aunt; Stroke in his brother. There is no history of Colon cancer.    ROS: All other  systems are reviewed and negative. Unless otherwise mentioned in H&P    PHYSICAL EXAM: VS:  BP 106/62 mmHg  Pulse  76  Ht 5\' 11"  (1.803 m)  Wt 218 lb (98.884 kg)  BMI 30.42 kg/m2  SpO2 95% , BMI Body mass index is 30.42 kg/(m^2). GEN: Well nourished, well developed, in no acute distress HEENT: normal Neck: no JVD, carotid bruits, or masses Cardiac: RRR; no murmurs, rubs, or gallops,no edema  Respiratory:  clear to auscultation bilaterally, normal work of breathing crackles in the right base. GI: soft, nontender, nondistended, + BS MS: no deformity or atrophyseveral lacerations on the right side of his face, arms and knees bilaterally. Skin: warm and dry, no rash Neuro:  Strength and sensation are intact Psych: euthymic mood, full affect   Recent Labs: 10/28/2014: B Natriuretic Peptide 76.0 12/26/2014: Magnesium 1.9; TSH 1.836 10/02/2015: ALT 56; BUN 16; Creatinine, Ser 0.91; Hemoglobin 11.3*; Platelets 63*; Potassium 3.5; Sodium 134*    Lipid Panel No results found for: CHOL, TRIG, HDL, CHOLHDL, VLDL, LDLCALC, LDLDIRECT    Wt Readings from Last 3 Encounters:  10/10/15 218 lb (98.884 kg)  10/02/15 200 lb (90.719 kg)  09/23/15 208 lb (94.348 kg)      ASSESSMENT AND PLAN:  1. Persistent hypotension: He has had medications discontinued which include diltiazem, atenolol, and diuretics. He continues to have dizziness especially with position change, and blood pressure stops here in the office. I will begin him on Midodrine 2.5 mg daily with possibility of increase it to twice a day.They blood pressure log at home. And he will see him in our office again in 2 weeks for a blood pressure check.  I have spoken to Dr. Wynne Dust, with [facility]. He finds no contraindications and beginning Midrin in the setting of portal hypertension and esophageal varices grade 1. They wish to followup with him in approximately one month to 6 weeks.  2. Paroxysmal atrial fibrillation: The patient is  on no anticoagulation therapy at this time.in the setting of esophageal varices and portal hypertension. Heart rate is controlled currently. Without any AV nodal blocking agents.Pacemaker was checked during recent hospitalization and found to be functioning normally.  3. History of TAVR: continues on aspirin and Plavix. Echocardiogram revealed normal functioning valve on recent hospitalization.  4. Coronary artery disease:status post coronary bypass grafting. Echocardiogram revealed normal LV systolic function on recent hospitalization.  5. Chest Congestion: the patient is not taking deep breaths to do to bruised ribs on the right. I've asked him to use a pillow to splint and that he needs to take deep breaths and cough. Will not pursue chest x-ray or any other testing at this time.  Current medicines are reviewed at length with the patient today.    Labs/ tests ordered today include:  No orders of the defined types were placed in this encounter.     Disposition:   FU with 2 weeks for blood pressure check, one month with Dr. Purvis Sheffield.   Signed, Joni Reining, NP  10/10/2015 3:42 PM    [facility] [address] [place], [place] [address] Phone: [phone]; Fax: [phone]

## 2015-10-10 NOTE — Progress Notes (Deleted)
Name: [name]    DOB: 27-Jun-1945  Age: 70 y.o.  MR#: [id number]       PCP:  Donzetta Sprung, MD      Insurance: Payor: MEDICARE / Plan: MEDICARE PART A AND B / Product Type: *No Product type* /   CC:   No chief complaint on file.   VS Filed Vitals:   10/10/15 1536  BP: 106/62  Pulse: 76  Height: 5\' 11"  (1.803 m)  Weight: 218 lb (98.884 kg)  SpO2: 95%    Weights Current Weight  10/10/15 218 lb (98.884 kg)  10/02/15 200 lb (90.719 kg)  09/23/15 208 lb (94.348 kg)    Blood Pressure  BP Readings from Last 3 Encounters:  10/10/15 106/62  10/03/15 117/59  09/23/15 119/74     Admit date:  (Not on file) Last encounter with RMR:  Visit date not found   Allergy Xarelto  Current Outpatient Prescriptions  Medication Sig Dispense Refill  . albuterol-ipratropium (COMBIVENT) 18-103 MCG/ACT inhaler Inhale 2 puffs into the lungs every 6 (six) hours as needed for wheezing or shortness of breath.     . Artificial Tear Ointment (DRY EYES OP) Apply 1 drop to eye daily as needed (dry eyes).    Marland Kitchen ascorbic acid (VITAMIN C) 500 MG tablet Take 500 mg by mouth 2 (two) times daily.     Marland Kitchen aspirin 81 MG tablet Take 1 tablet (81 mg total) by mouth daily.    . cetirizine (ZYRTEC) 10 MG tablet Take 10 mg by mouth at bedtime.    . clopidogrel (PLAVIX) 75 MG tablet Take 1 tablet (75 mg total) by mouth daily. 90 tablet 3  . co-enzyme Q-10 30 MG capsule Take 30 mg by mouth at bedtime.     . CRESTOR 5 MG tablet Take 5 mg by mouth every other day. 3 day weekly    . desonide (DESOWEN) 0.05 % cream Apply 1 application topically 2 (two) times daily as needed (for rash).     . DULoxetine (CYMBALTA) 60 MG capsule Take 60 mg by mouth daily.    Marland Kitchen ezetimibe (ZETIA) 10 MG tablet Take 10 mg by mouth at bedtime.     . flunisolide (NASAREL) 29 MCG/ACT (0.025%) nasal spray Place 2 sprays into the nose at bedtime as needed for rhinitis (for congestion). Dose is for each nostril.    . insulin NPH-regular Human  (NOVOLIN 70/30) (70-30) 100 UNIT/ML injection Inject 15 Units into the skin 2 (two) times daily. (Patient taking differently: Inject 15-20 Units into the skin 2 (two) times daily. 40 as a base; <180 = 40 units) 10 mL 11  . ipratropium-albuterol (DUONEB) 0.5-2.5 (3) MG/3ML SOLN Take 3 mLs by nebulization every 6 (six) hours as needed (for shortness of breath).    Marland Kitchen ketoconazole (NIZORAL) 2 % cream Apply 1 application topically 2 (two) times daily as needed for irritation.     Marland Kitchen KRISTALOSE 20 G packet Take 10 g by mouth 2 (two) times daily as needed (diarrhea).     . loratadine (CLARITIN) 10 MG tablet Take 10 mg by mouth every morning.     . Multiple Vitamin (MULTIVITAMIN) tablet Take 1 tablet by mouth daily.      Marland Kitchen oxyCODONE-acetaminophen (PERCOCET/ROXICET) 5-325 MG per tablet Take 1-2 tablets by mouth every 4 (four) hours as needed for moderate pain. (Patient taking differently: Take 1 tablet by mouth every 4 (four) hours as needed for moderate pain. ) 40 tablet 0  . pantoprazole (  PROTONIX) 40 MG tablet 1 po 30 mins prior to first meal (Patient taking differently: Take 40 mg by mouth daily as needed (acid reflux). ) 30 tablet 11  . potassium chloride SA (K-DUR,KLOR-CON) 20 MEQ tablet Take 1 tablet (20 mEq total) by mouth 2 (two) times daily. 180 tablet 3  . predniSONE (DELTASONE) 5 MG tablet Take 5 mg by mouth daily with breakfast. Reported on 09/23/2015    . rifaximin (XIFAXAN) 550 MG TABS tablet Take 550 mg by mouth 2 (two) times daily.    Marland Kitchen torsemide (DEMADEX) 20 MG tablet Take 1 tablet (20 mg total) by mouth daily as needed. For edema / weight gain (Patient not taking: Reported on 10/10/2015) 30 tablet 0   No current facility-administered medications for this visit.    Discontinued Meds:    Medications Discontinued During This Encounter  Medication Reason  . nadolol (CORGARD) 20 MG tablet Error    Patient Active Problem List   Diagnosis Date Noted  . Orthostatic hypotension 10/02/2015  .  Syncope 10/02/2015  . Loss of weight 07/09/2015  . Acute renal failure (HCC) 12/30/2014  . Atrial fibrillation, chronic 12/30/2014  . 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 Increased ammonia level 12/30/2014  . Hyposmolality and/or hyponatremia 12/30/2014  . CHB (complete heart block) (HCC) 12/30/2014  . Elevated troponin level, secondary to CHB acute renal failure 12/30/2014  . Mental status alteration, due to bradycardia and hepatic encephalopathy now improved 12/30/2014  . Symptomatic bradycardia 12/26/2014  . Chronic respiratory failure (HCC) 11/03/2014  . Thrombocytopenia (HCC) 10/30/2014  . Ataxia 10/29/2014  . 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 Severe aortic valve stenosis 04/03/2014  . Chronic periodontitis 03/12/2014  . Acute on chronic diastolic heart failure (HCC) 02/01/2014  . Chronic diastolic congestive heart failure (HCC) 02/01/2014  . Iron deficiency anemia 02/01/2014  . Cirrhosis (HCC) 02/01/2014  . Portal hypertension (HCC) 02/01/2014  . Chronic pancreatitis (HCC) 02/01/2014  . Degenerative disc disease, lumbar   . Epistaxis   . Chronic blood loss anemia 01/31/2014  . Morbid obesity (HCC)   . CHF (congestive heart failure) (HCC) 01/29/2014  . COPD GOLD III with reversibility  04/30/2013  . Dyspnea 03/23/2013  . Rheumatoid arthritis (HCC) 03/23/2013  . Aortic stenosis 03/16/2013  . HTN (hypertension) 03/16/2013  . Hyperlipidemia 03/16/2013  . LIVER FUNCTION TESTS, ABNORMAL, HX OF 02/25/2010  . Coronary atherosclerosis of artery bypass graft 01/15/2009  . Atrial flutter (HCC) 01/15/2009  . Sciatica 01/15/2009  . S/P CABG x 5 03/27/1997    LABS    Component Value Date/Time   NA 134* 10/02/2015 0557   NA 135 10/01/2015 2209   NA 138 07/09/2015 1113   K 3.5 10/02/2015 0557   K 3.6 10/01/2015 2209   K 4.3 07/09/2015 1113   CL 102 10/02/2015 0557   CL 100* 10/01/2015 2209   CL 99  07/09/2015 1113   CO2 27 10/02/2015 0557   CO2 29 10/01/2015 2209   CO2 29 07/09/2015 1113   GLUCOSE 172* 10/02/2015 0557   GLUCOSE 96 10/01/2015 2209   GLUCOSE 164* 07/09/2015 1113   BUN 16 10/02/2015 0557   BUN 15 10/01/2015 2209   BUN 20 07/09/2015 1113   CREATININE 0.91 10/02/2015 0557   CREATININE 1.00 10/01/2015 2209   CREATININE 1.17 07/09/2015 1113   CREATININE 2.12* 12/31/2014 0531   CALCIUM 8.3* 10/02/2015 0557   CALCIUM 8.7* 10/01/2015 2209   CALCIUM 9.3 07/09/2015  1113   GFRNONAA >60 10/02/2015 0557   GFRNONAA >60 10/01/2015 2209   GFRNONAA 63 07/09/2015 1113   GFRNONAA 30* 12/31/2014 0531   GFRAA >60 10/02/2015 0557   GFRAA >60 10/01/2015 2209   GFRAA 73 07/09/2015 1113   GFRAA 35* 12/31/2014 0531   CMP     Component Value Date/Time   NA 134* 10/02/2015 0557   K 3.5 10/02/2015 0557   CL 102 10/02/2015 0557   CO2 27 10/02/2015 0557   GLUCOSE 172* 10/02/2015 0557   BUN 16 10/02/2015 0557   CREATININE 0.91 10/02/2015 0557   CREATININE 1.17 07/09/2015 1113   CALCIUM 8.3* 10/02/2015 0557   PROT 6.1* 10/02/2015 0557   ALBUMIN 2.3* 10/02/2015 0557   AST 65* 10/02/2015 0557   ALT 56 10/02/2015 0557   ALKPHOS 130* 10/02/2015 0557   BILITOT 1.4* 10/02/2015 0557   GFRNONAA >60 10/02/2015 0557   GFRNONAA 63 07/09/2015 1113   GFRAA >60 10/02/2015 0557   GFRAA 73 07/09/2015 1113       Component Value Date/Time   WBC 4.6 10/02/2015 0557   WBC 4.8 10/01/2015 2209   WBC 6.1 07/09/2015 1113   WBC 3.3* 11/24/2011 1321   HGB 11.3* 10/02/2015 0557   HGB 12.5* 10/01/2015 2209   HGB 12.5* 07/09/2015 1113   HGB 15.1 11/24/2011 1321   HCT 33.5* 10/02/2015 0557   HCT 37.8* 10/01/2015 2209   HCT 38.4* 07/09/2015 1113   HCT 45.3 11/24/2011 1321   MCV 96.8 10/02/2015 0557   MCV 96.9 10/01/2015 2209   MCV 97.5 07/09/2015 1113   MCV 93.8 11/24/2011 1321    Lipid Panel  No results found for: CHOL, TRIG, HDL, CHOLHDL, VLDL, LDLCALC, LDLDIRECT  ABG    Component  Value Date/Time   PHART 7.383 04/03/2014 1406   PCO2ART 49.1* 04/03/2014 1406   PO2ART 102.0* 04/03/2014 1406   HCO3 29.2* 04/03/2014 1406   TCO2 24 04/03/2014 2019   ACIDBASEDEF 0.0 12/27/2008 1412   O2SAT 98.0 04/03/2014 1406     Lab Results  Component Value Date   TSH 1.836 12/26/2014   BNP (last 3 results)  Recent Labs  10/28/14 2250  BNP 76.0    ProBNP (last 3 results) No results for input(s): PROBNP in the last 8760 hours.  Cardiac Panel (last 3 results) No results for input(s): CKTOTAL, CKMB, TROPONINI, RELINDX in the last 72 hours.  Iron/TIBC/Ferritin/ %Sat    Component Value Date/Time   IRON 45* 07/09/2015 1113   TIBC 311 07/09/2015 1113   FERRITIN 53 07/09/2015 1113   IRONPCTSAT 14* 07/09/2015 1113     EKG Orders placed or performed during the hospital encounter of 10/01/15  . EKG 12-Lead  . EKG 12-Lead  . ED EKG  . ED EKG     Prior Assessment and Plan Problem List as of 10/10/2015      Cardiovascular and Mediastinum   Coronary atherosclerosis of artery bypass graft   Last Assessment & Plan 12/10/2010 Office Visit Written 12/12/2010 10:01 AM by June Leap, MD    Continue medical therapy with dual antiplatelet therapy. The patient has a bare-metal stents the stents placed in the vein graft. Ejection fraction is stable by current echocardiogram      Atrial flutter [facility])   Last Assessment & Plan 12/10/2010 Office Visit Written 12/12/2010 10:02 AM by June Leap, MD    No recurrent arrhythmias no indication for anticoagulation at the present time  Aortic stenosis   HTN (hypertension)   Last Assessment & Plan 03/31/2015 Office Visit Written 03/31/2015  9:04 AM by Marinus Maw, MD    His blood pressure is increased today. He did not take his morning meds. Will follow.       CHF (congestive heart failure) (HCC)   Chronic diastolic congestive heart failure [facility])   Last Assessment & Plan 03/31/2015 Office Visit Written 03/31/2015  9:03 AM by Marinus Maw, MD    His symptoms are class 2. He is fairly sedentary. He will continue his current meds.      Portal hypertension (HCC)   Acute on chronic diastolic heart failure (HCC)   Severe aortic valve stenosis   Atrial fibrillation, chronic   Last Assessment & Plan 03/31/2015 Office Visit Written 03/31/2015  9:02 AM by Marinus Maw, MD    His ventricular rate is well controlled. No change in medications.       CHB (complete heart block) (HCC)   Orthostatic hypotension   Syncope     Respiratory   COPD GOLD III with reversibility    Last Assessment & Plan 04/30/2013 Office Visit Written 05/02/2013  8:44 AM by Nyoka Cowden, MD    - PFT's 04/30/2013 FEV1  1.51 (44%) p B2 (which was a 25% improvement) and ratio 52 with dlco 59 corrects to 74%  I had an extended summary  discussion with the patient today lasting 15 to 20 minutes of a 25 minute visit on the following issues:  pft's reviewed looking at the cup half full in that he clearly does have a reversible component  He clearly is better now on ICS/LABA  and now more limited by knees not sob so would leave well enough alone but if does become more limited by sob best option would be lama like spiriva or turdorza as add on.  Since maintaining on pred by rheum one other option to consolidate rx is just to use anoro daily  Advised him that regardless what we pick as a "Plan A" we use the amt of "Plan B" use (saba use ) to determine whether a change in Plan A needed.  For now he doing fine so no change rx needed    Each maintenance medication was reviewed in detail including most importantly the difference between maintenance and as needed and under what circumstances the prns are to be used.  Please see instructions for details which were reviewed in writing and the patient given a copy.        Epistaxis   Chronic respiratory failure (HCC)     Digestive   Cirrhosis [facility])   Last Assessment & Plan 09/23/2015 Office Visit Written  09/30/2015  4:57 PM by Nira Retort, NP    Likely secondary to NASH. Persistent weight loss noted. Patient does not desire to pursue liver biopsy, hepatitis vaccinations, or Vascular surgery referral for evaluation of possible chronic mesenteric ischemia. I spent a significant amount of time discussing the risks involved with declining aspects of care. Patient is well aware and appears tired, possibly depressed but denies this, and wife is quite supportive of him. EGD up-to-date and needs surveillance in 1-2 years.   US abdomen in Sept 2017: we will place on recall Referral to IR Vascular specialists if agreeable in the future due to CTA findings Continue Nadolol Return in 6 months Limited options to offer patient as he does not desire aggressive treatment or evaluation.  Chronic pancreatitis (HCC)   Chronic periodontitis     Nervous and Auditory   Sciatica     Musculoskeletal and Integument   Rheumatoid arthritis [facility])   Last Assessment & Plan 03/21/2013 Office Visit Written 03/23/2013  8:35 AM by Nyoka Cowden, MD    No evidence for sign RA lung dz at present, steroid dep longterm can adjust dose for joints but should not need to do so for lungs      Degenerative disc disease, lumbar     Genitourinary   Acute renal failure (HCC)     Other   Hyperlipidemia   S/P CABG x 5   Morbid obesity (HCC)   LIVER FUNCTION TESTS, ABNORMAL, HX OF   Last Assessment & Plan 12/10/2010 Office Visit Written 12/12/2010 10:02 AM by June Leap, MD    Followed by Dr. Reuel Boom. Apparently chronically elevated      Dyspnea   Last Assessment & Plan 03/21/2013 Office Visit Written 03/23/2013  8:33 AM by Nyoka Cowden, MD    -03/21/2013  Walked RA  2 laps @ 185 ft each stopped due to  Weak legs and sob with sats still 92%   When respiratory symptoms begin or become refractory well after a patient reports complete smoking cessation,  Especially when this wasn't the case while they were smoking, a red  flag is raised based on the work of Dr Primitivo Gauze which states:  if you quit smoking when your best day FEV1 is still well preserved it is highly unlikely you will progress to severe disease.  That is to say, once the smoking stops,  the symptoms should not suddenly erupt or markedly worsen.  If so, the differential diagnosis should include  obesity/deconditioning,  LPR/Reflux/Aspiration syndromes,  occult CHF, or  especially side effect of medications commonly used in this population.   For now try add perfomist to bud and change duoneb/combivent to backup until returns for pfts  ? Acid (or non-acid) GERD > always difficult to exclude as up to 75% of pts in some series report no assoc GI/ Heartburn symptoms> rec max (24h)  acid suppression and diet restrictions/ reviewed and instructions given in writting   Obesity/ deconditioning clearly a concern as well ? Rehab potential > discuss next ov   ? CHF > see echo 02/15/13  C/w AS:  Mean gradient: 20mm Hg (S). 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 Will recheck in several months.       Increased ammonia level   Hyposmolality and/or hyponatremia   Elevated troponin level, secondary to CHB acute renal failure   Mental status alteration, due to bradycardia and hepatic encephalopathy now improved   Loss of weight       Imaging: Dg Chest 1 View  10/02/2015  CLINICAL DATA:  Trip and fall in a parking lot this evening. Anterior chest pain. EXAM: CHEST 1 VIEW COMPARISON:  02/28/2015 FINDINGS: Post median sternotomy and CABG and aortic valve rib. Single  lead pacemaker remains in place with lead projecting over the  right ventricle. Heart is normal in size. Chronic interstitial coarsening. No pneumothorax. No large pleural effusion or focal airspace disease. Remote right rib fractures are again seen. No acute osseous abnormality. Postsurgical change in the cervical spine, partially included. IMPRESSION: No acute process. Electronically Signed   By: Rubye Oaks M.D.   On: 10/02/2015 00:04   Ct Head Wo Contrast  10/02/2015  CLINICAL DATA:  Status post fall at [facility] parking lot, with knot at the forehead. Concern for head or cervical spine injury. Initial encounter. EXAM: CT HEAD WITHOUT CONTRAST CT CERVICAL SPINE WITHOUT CONTRAST TECHNIQUE: Multidetector CT imaging of the head and cervical spine was performed following the standard protocol without intravenous contrast. Multiplanar CT image reconstructions of the cervical spine were also generated. COMPARISON:  CT of the head performed 11/13/2014, and MRI of the brain performed 10/29/2014. CT of the neck performed 02/20/2014 FINDINGS: CT HEAD FINDINGS There is no evidence of acute infarction, mass lesion, or intra- or extra-axial hemorrhage on CT. Prominence of the ventricles and sulci reflects mild cortical volume loss. Mild periventricular white matter change likely reflects small vessel ischemic microangiopathy. A chronic lacunar infarct is noted at the right pons. The fourth ventricle is within normal limits. The basal ganglia are unremarkable in appearance. The cerebral hemispheres demonstrate grossly normal gray-white differentiation. No mass effect or midline shift is seen. There is no evidence of fracture; visualized osseous structures are unremarkable in appearance. The orbits are within normal limits. There appears to be a small amount of blood within the right maxillary sinus. The remaining paranasal sinuses and mastoid air cells are well-aerated. Soft tissue swelling is noted  overlying the right frontal calvarium. CT CERVICAL SPINE FINDINGS There is no evidence of fracture or subluxation. The patient is status post anterior cervical spinal fusion at C3-C4, and posterior cervical spinal fusion at C7-T1. Small anterior and posterior disc osteophyte complexes are seen along the cervical spine. Vertebral bodies demonstrate normal height and alignment. Prevertebral soft tissues are within normal limits. The visualized portions of the thyroid gland are unremarkable in appearance. The visualized lung apices are clear. Prominent calcification is seen at the carotid bifurcations bilaterally, more prominent on the left, with likely moderate luminal narrowing bilaterally. IMPRESSION: 1. No evidence of traumatic intracranial injury or fracture. 2. Small amount of blood within the right maxillary sinus, without definite evidence of an associated fracture on reconstructions. 3. No evidence of fracture or subluxation along the cervical spine. 4. Soft tissue swelling overlying the right frontal calvarium. 5. Mild cortical volume loss and scattered small vessel ischemic microangiopathy. 6. Chronic lacunar infarct at the right pons. 7. Status post anterior cervical spinal fusion at C3-C4, and posterior cervical spinal fusion at C7-T1. Mild underlying degenerative change noted. 8. Prominent calcification at the carotid bifurcations bilaterally, more prominent on the left, with likely moderate luminal narrowing bilaterally. Carotid ultrasound is recommended for further evaluation, when and as deemed clinically appropriate. Electronically Signed   By: Roanna Raider M.D.   On: 10/02/2015 00:04   Ct Cervical Spine Wo Contrast  10/02/2015  CLINICAL DATA:  Status post fall at [facility] parking lot, with knot at the forehead. Concern for head or cervical spine injury. Initial encounter. EXAM: CT HEAD WITHOUT CONTRAST CT CERVICAL SPINE WITHOUT CONTRAST TECHNIQUE: Multidetector CT imaging of the  head and cervical spine was performed following the standard protocol without intravenous contrast. Multiplanar CT image reconstructions of the cervical spine were also generated. COMPARISON:  CT of the  head performed 11/13/2014, and MRI of the brain performed 10/29/2014. CT of the neck performed 02/20/2014 FINDINGS: CT HEAD FINDINGS There is no evidence of acute infarction, mass lesion, or intra- or extra-axial hemorrhage on CT. Prominence of the ventricles and sulci reflects mild cortical volume loss. Mild periventricular white matter change likely reflects small vessel ischemic microangiopathy. A chronic lacunar infarct is noted at the right pons. The fourth ventricle is within normal limits. The basal ganglia are unremarkable in appearance. The cerebral hemispheres demonstrate grossly normal gray-white differentiation. No mass effect or midline shift is seen. There is no evidence of fracture; visualized osseous structures are unremarkable in appearance. The orbits are within normal limits. There appears to be a small amount of blood within the right maxillary sinus. The remaining paranasal sinuses and mastoid air cells are well-aerated. Soft tissue swelling is noted overlying the right frontal calvarium. CT CERVICAL SPINE FINDINGS There is no evidence of fracture or subluxation. The patient is status post anterior cervical spinal fusion at C3-C4, and posterior cervical spinal fusion at C7-T1. Small anterior and posterior disc osteophyte complexes are seen along the cervical spine. Vertebral bodies demonstrate normal height and alignment. Prevertebral soft tissues are within normal limits. The visualized portions of the thyroid gland are unremarkable in appearance. The visualized lung apices are clear. Prominent calcification is seen at the carotid bifurcations bilaterally, more prominent on the left, with likely moderate luminal narrowing bilaterally. IMPRESSION: 1. No evidence of traumatic intracranial injury  or fracture. 2. Small amount of blood within the right maxillary sinus, without definite evidence of an associated fracture on reconstructions. 3. No evidence of fracture or subluxation along the cervical spine. 4. Soft tissue swelling overlying the right frontal calvarium. 5. Mild cortical volume loss and scattered small vessel ischemic microangiopathy. 6. Chronic lacunar infarct at the right pons. 7. Status post anterior cervical spinal fusion at C3-C4, and posterior cervical spinal fusion at C7-T1. Mild underlying degenerative change noted. 8. Prominent calcification at the carotid bifurcations bilaterally, more prominent on the left, with likely moderate luminal narrowing bilaterally. Carotid ultrasound is recommended for further evaluation, when and as deemed clinically appropriate. Electronically Signed   By: Roanna Raider M.D.   On: 10/02/2015 00:04   Dg Femur, Min 2 Views Right  10/02/2015  CLINICAL DATA:  Trip and fall in a parking lot this evening. Mid right thigh pain. EXAM: RIGHT FEMUR 2 VIEWS COMPARISON:  None. FINDINGS: There is no evidence of fracture or other focal bone lesions. Hip and knee alignment is maintained. Vascular calcifications are seen. Surgical clips in the medial soft tissues. IMPRESSION: No right femur fracture. Electronically Signed   By: Rubye Oaks M.D.   On: 10/02/2015 00:02

## 2015-10-14 LAB — CUP PACEART REMOTE DEVICE CHECK
Battery Voltage: 2.79 V
Date Time Interrogation Session: 20170601024145
Implantable Lead Model: 5076
Lead Channel Impedance Value: 598 Ohm
Lead Channel Setting Sensing Sensitivity: 4 mV
MDC IDC LEAD IMPLANT DT: 20160826
MDC IDC LEAD LOCATION: 753860
MDC IDC MSMT BATTERY IMPEDANCE: 159 Ohm
MDC IDC MSMT BATTERY REMAINING LONGEVITY: 109 mo
MDC IDC MSMT LEADCHNL RA IMPEDANCE VALUE: 0 Ohm
MDC IDC SET LEADCHNL RV PACING AMPLITUDE: 2.5 V
MDC IDC SET LEADCHNL RV PACING PULSEWIDTH: 0.4 ms
MDC IDC STAT BRADY RV PERCENT PACED: 87 %

## 2015-10-21 ENCOUNTER — Encounter: Payer: Self-pay | Admitting: Cardiology

## 2015-10-22 ENCOUNTER — Telehealth: Payer: Self-pay | Admitting: Cardiovascular Disease

## 2015-10-22 NOTE — Telephone Encounter (Signed)
Patient had no complaints after BP readings,they want to know if he still has to come for BP check on Friday ?    Will forward to Ms Lyman Bishop NP for advice

## 2015-10-22 NOTE — Telephone Encounter (Signed)
Particia Jasper, RN ([facility]) called in regards to patients blood pressure readings. Sitting 120/74, standing 100/60 after physical therapy 140.66 HR 74 irregular  Please call [phone]

## 2015-10-23 NOTE — Telephone Encounter (Signed)
Yes. I would like and official BP reading in our office. Thanks.

## 2015-10-23 NOTE — Telephone Encounter (Signed)
Called pt back to let him know he still needs to keep his Friday appointment.

## 2015-10-23 NOTE — Telephone Encounter (Signed)
Wife called,pt will come for apt

## 2015-10-24 ENCOUNTER — Ambulatory Visit (INDEPENDENT_AMBULATORY_CARE_PROVIDER_SITE_OTHER): Payer: Medicare Other | Admitting: *Deleted

## 2015-10-24 DIAGNOSIS — I951 Orthostatic hypotension: Secondary | ICD-10-CM | POA: Diagnosis not present

## 2015-10-24 NOTE — Progress Notes (Signed)
Patient states that after standing for awhile he did feel dizzy. Wife states that pt falls after taking 2 steps after going from a sitting position to a standing position.

## 2015-10-24 NOTE — Progress Notes (Signed)
Patient advised to stand for a few moments before walking forward. Keep near something to hold on to. Keep appointment previously scheduled. Stay hydrated. Can take midodrine 8 hours after first dose. Up to 3 tablets a day in divided doses 8 hours a part.

## 2015-10-26 ENCOUNTER — Other Ambulatory Visit: Payer: Self-pay | Admitting: Cardiovascular Disease

## 2015-11-19 ENCOUNTER — Ambulatory Visit (INDEPENDENT_AMBULATORY_CARE_PROVIDER_SITE_OTHER): Payer: Medicare Other | Admitting: Cardiovascular Disease

## 2015-11-19 VITALS — BP 130/80 | HR 69 | Ht 71.0 in | Wt 227.0 lb

## 2015-11-19 DIAGNOSIS — I25812 Atherosclerosis of bypass graft of coronary artery of transplanted heart without angina pectoris: Secondary | ICD-10-CM

## 2015-11-19 DIAGNOSIS — I951 Orthostatic hypotension: Secondary | ICD-10-CM

## 2015-11-19 DIAGNOSIS — I2510 Atherosclerotic heart disease of native coronary artery without angina pectoris: Secondary | ICD-10-CM

## 2015-11-19 DIAGNOSIS — E785 Hyperlipidemia, unspecified: Secondary | ICD-10-CM

## 2015-11-19 DIAGNOSIS — I482 Chronic atrial fibrillation, unspecified: Secondary | ICD-10-CM

## 2015-11-19 DIAGNOSIS — Z9289 Personal history of other medical treatment: Secondary | ICD-10-CM

## 2015-11-19 DIAGNOSIS — I10 Essential (primary) hypertension: Secondary | ICD-10-CM

## 2015-11-19 DIAGNOSIS — Z954 Presence of other heart-valve replacement: Secondary | ICD-10-CM | POA: Diagnosis not present

## 2015-11-19 DIAGNOSIS — I5032 Chronic diastolic (congestive) heart failure: Secondary | ICD-10-CM

## 2015-11-19 DIAGNOSIS — Z87898 Personal history of other specified conditions: Secondary | ICD-10-CM

## 2015-11-19 DIAGNOSIS — Z950 Presence of cardiac pacemaker: Secondary | ICD-10-CM

## 2015-11-19 DIAGNOSIS — Z952 Presence of prosthetic heart valve: Secondary | ICD-10-CM

## 2015-11-19 MED ORDER — NADOLOL 20 MG PO TABS: 10.0000 mg | ORAL_TABLET | Freq: Every day | ORAL | Status: AC

## 2015-11-19 NOTE — Progress Notes (Signed)
Patient ID: [name], male   DOB: [birth year], 70 y.o.   MRN: [id number]      SUBJECTIVE: The patient presents for routine cardiovascular follow-up. He has a history of chronic diastolic heart failure, atrial fibrillation and flutter, CABG, and TAVR.  He was hospitalized for orthostatic syncope in June 2017. It was deemed secondary to chronic diarrhea related to lactulose, poor oral intake, diuretic use, and combination of home beta blocker and calcium channel blocker. Pacemaker was interrogated and showed normal function. Torsemide was changed to as needed and diltiazem was discontinued.  Echocardiogram on 10/03/15 showed normal left ventricular systolic function, EF 60-65%, moderate LVH, and normally functioning prosthetic aortic valve.  He still complains of dizziness and near syncope. He is now on midodrine. He denies exertional chest pain, leg swelling, and shortness of breath. Battling shingles. Does not have much of an appetite.   Review of Systems: As per "subjective", otherwise negative.  Allergies  Allergen Reactions  . Xarelto [Rivaroxaban]     Caused bleeding from unknown source     Current Outpatient Prescriptions  Medication Sig Dispense Refill  . albuterol-ipratropium (COMBIVENT) 18-103 MCG/ACT inhaler Inhale 2 puffs into the lungs every 6 (six) hours as needed for wheezing or shortness of breath.     . Artificial Tear Ointment (DRY EYES OP) Apply 1 drop to eye daily as needed (dry eyes).    Marland Kitchen ascorbic acid (VITAMIN C) 500 MG tablet Take 500 mg by mouth 2 (two) times daily.     Marland Kitchen aspirin 81 MG tablet Take 1 tablet (81 mg total) by mouth daily.    . cetirizine (ZYRTEC) 10 MG tablet Take 10 mg by mouth at bedtime.    . clopidogrel (PLAVIX) 75 MG tablet Take 1 tablet (75 mg total) by mouth daily. 90 tablet 3  . co-enzyme Q-10 30 MG capsule Take 30 mg by mouth at bedtime.     . CRESTOR 5 MG tablet Take 5 mg by mouth every other day. 3 day weekly    . desonide  (DESOWEN) 0.05 % cream Apply 1 application topically 2 (two) times daily as needed (for rash).     . DULoxetine (CYMBALTA) 60 MG capsule Take 60 mg by mouth daily.    Marland Kitchen ezetimibe (ZETIA) 10 MG tablet Take 10 mg by mouth at bedtime.     . flunisolide (NASAREL) 29 MCG/ACT (0.025%) nasal spray Place 2 sprays into the nose at bedtime as needed for rhinitis (for congestion). Dose is for each nostril.    . insulin NPH-regular Human (NOVOLIN 70/30) (70-30) 100 UNIT/ML injection Inject 15 Units into the skin 2 (two) times daily. (Patient taking differently: Inject 15-20 Units into the skin 2 (two) times daily. 40 as a base; <180 = 40 units) 10 mL 11  . ipratropium-albuterol (DUONEB) 0.5-2.5 (3) MG/3ML SOLN Take 3 mLs by nebulization every 6 (six) hours as needed (for shortness of breath).    Marland Kitchen ketoconazole (NIZORAL) 2 % cream Apply 1 application topically 2 (two) times daily as needed for irritation.     Marland Kitchen KRISTALOSE 20 G packet Take 10 g by mouth 2 (two) times daily as needed (diarrhea).     . loratadine (CLARITIN) 10 MG tablet Take 10 mg by mouth every morning.     . midodrine (PROAMATINE) 2.5 MG tablet Take 1 tablet (2.5 mg total) by mouth daily. 30 tablet 6  . Multiple Vitamin (MULTIVITAMIN) tablet Take 1 tablet by mouth daily.      Marland Kitchen  oxyCODONE-acetaminophen (PERCOCET/ROXICET) 5-325 MG per tablet Take 1-2 tablets by mouth every 4 (four) hours as needed for moderate pain. (Patient taking differently: Take 1 tablet by mouth every 4 (four) hours as needed for moderate pain. ) 40 tablet 0  . pantoprazole (PROTONIX) 40 MG tablet 1 po 30 mins prior to first meal (Patient taking differently: Take 40 mg by mouth daily as needed (acid reflux). ) 30 tablet 11  . potassium chloride SA (K-DUR,KLOR-CON) 20 MEQ tablet Take 1 tablet (20 mEq total) by mouth 2 (two) times daily. 180 tablet 3  . predniSONE (DELTASONE) 20 MG tablet     . rifaximin (XIFAXAN) 550 MG TABS tablet Take 550 mg by mouth 2 (two) times daily.    Marland Kitchen  torsemide (DEMADEX) 20 MG tablet Take 1 tablet (20 mg total) by mouth daily as needed. For edema / weight gain 30 tablet 0  . valACYclovir (VALTREX) 1000 MG tablet      No current facility-administered medications for this visit.    Past Medical History  Diagnosis Date  . COPD (chronic obstructive pulmonary disease) (HCC)   . Elevated liver function tests   . Hypertension   . Aortic stenosis     a. 2D ECHO: 01/30/2014: ef 50-55%. No RWMAs. Severe WL:NLGXQ area (VTI): 0.82 cm^2. Valve area (Vmax): 0.74 cm^2. Valve area (Vmean): 0.75 Cm^2. Mod LA/RA dilation. PA peak pressure 33  . CAD (coronary artery disease)   . GERD (gastroesophageal reflux disease)   . Chronic atrial flutter (HCC)   . 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 Morbid obesity (HCC)   . Coronary atherosclerosis of artery bypass graft 2010  . Acute on chronic diastolic heart failure (HCC) 02/01/2014  . Chronic diastolic congestive heart failure (HCC) 02/01/2014  . Epistaxis     Developed on Xarelto therapy  . Cirrhosis (HCC) 02/01/2014  . Portal hypertension (HCC) 02/01/2014  . Chronic pancreatitis (HCC) 02/01/2014    Noted on CT scan  . Shortness of breath   . Dizziness   . Heart murmur   . Asthma     uses nebulizers daily  . Dysrhythmia     fib/flutter  . Anemia     was on xalerto sept 2015 for afib - was taken off of - had to get unit of prbc  . History of blood transfusion 01/2014    "right after starting Xeralto"  . 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 Hyperlipidemia   . Pneumonia "several times:  . Type II diabetes mellitus (HCC)   . Rheumatoid arthritis(714.0)     "everywhere"  . Degenerative disc disease, cervical   . Degenerative disc disease, lumbar 2010  . Osteoarthritis     "everywhere"  .  Chronic back pain     "lower back usually; upper back fior the last couple weeks"  (12/26/2014)  . ARF (acute renal failure) (HCC) 11/2014  . On home oxygen therapy     "2.5L at night & prn during the day" (12/26/2014)  . Other myositis, left thigh dx'd 12/26/2014  . Acute renal failure (HCC) 12/30/2014  . Atrial fibrillation (HCC) 12/30/2014  . 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 Hyposmolality and/or hyponatremia 12/30/2014  . CHB (complete  heart block) (HCC) 12/30/2014  . Elevated troponin level, secondary to CHB acute renal failure 12/30/2014  . Complication of anesthesia     "has a hard time breathing when he wakes up"    Past Surgical History  Procedure Laterality Date  . Coronary angioplasty with stent placement  01/02/2009    s/p bare metal stent to the vein graft to the RCA 9/20. nml LV function   . Appendectomy  1950  . Coronary artery bypass graft  03/27/1997    CABG x 5 by Dr Cornelius Moras  . Anterior cervical decomp/discectomy fusion  12/2006  . Tonsillectomy and adenoidectomy  1950  . Lumbar disc surgery  2011    "put plastic wedge in"  . Esophagogastroduodenoscopy (egd) with propofol N/A 02/01/2014    Dr. Bosie Clos: small distal esophageal varices and mild portal gastropathy  . Colonoscopy with propofol N/A 02/01/2014    Sigmoid diverticulosis   . Multiple extractions with alveoloplasty N/A 03/12/2014    Procedure: MULTIPLE EXTRACTION OF TOOTH #'S 2,6,7,8,9,10,11,14,18,20,29,31 WITH ALVEOLOPLASTY AND GROSS DEBRIDMENT OF REMAINING TEETH;  Surgeon: Charlynne Pander, DDS;  Location: [facility] OR;  Service: Oral Surgery;  Laterality: N/A;  . Back surgery    . Transcatheter aortic valve replacement, transfemoral N/A 04/03/2014    Procedure: TRANSCATHETER AORTIC VALVE REPLACEMENT, TRANSFEMORAL;  Surgeon: Purcell Nails, MD;  Location: [facility] OR;  Service: Open Heart Surgery;  Laterality: N/A;  . Intraoperative transesophageal echocardiogram N/A 04/03/2014     Procedure: INTRAOPERATIVE TRANSESOPHAGEAL ECHOCARDIOGRAM;  Surgeon: Purcell Nails, MD;  Location: [facility] OR;  Service: Open Heart Surgery;  Laterality: N/A;  . Left and right heart catheterization with coronary/graft angiogram N/A 02/05/2014    Procedure: LEFT AND RIGHT HEART CATHETERIZATION WITH Isabel Caprice;  Surgeon: Peter M Swaziland, MD;  Location: [facility] CATH LAB;  Service: Cardiovascular;  Laterality: N/A;  . Posterior laminectomy / decompression cervical spine  05/2007  . Ep implantable device N/A 12/27/2014    Procedure: Pacemaker Implant;  Surgeon: Marinus Maw, MD;  Location[facility] INVASIVE CV LAB;  Service: Cardiovascular;  Laterality: N/A;  . Insert / replace / remove pacemaker    . Esophagogastroduodenoscopy (egd) with propofol N/A 07/22/2015    Dr. Darrick Penna: grade 1 varices, portal gastropathy, Nadolol started. Repeat EGD in 1-2 years to reassess varices and possible banding   . Savory dilation N/A 07/22/2015    Procedure: SAVORY DILATION;  Surgeon: West Bali, MD;  Location: [facility] ENDO SUITE;  Service: Endoscopy;  Laterality: N/A;    Social History   Social History  . Marital Status: Married    Spouse Name: N/A  . Number of Children: N/A  . Years of Education: N/A   Occupational History  . Retired Special educational needs teacher    Social History Main Topics  . Smoking status: Former Smoker -- 3.00 packs/day for 35 years    Types: Cigarettes    Start date: 09/15/1961    Quit date: 11/23/1996  . Smokeless tobacco: Current User    Types: Chew  . Alcohol Use: No  . Drug Use: No  . Sexual Activity: Yes   Other Topics Concern  . Not on file   Social History Narrative   Married, retired.      Filed Vitals:   11/19/15 0929  BP: 130/80  Pulse: 69  Height: 5\' 11"  (1.803 m)  Weight: 227 lb (102.967 kg)  SpO2: 96%    PHYSICAL EXAM General: NAD HEENT: Normal. Neck: No JVD, no thyromegaly. Lungs: Clear to auscultation  bilaterally with normal respiratory effort. CV: Nondisplaced PMI.   Regular rate and rhythm, normal S1/S2, no S3/S4, no murmur. No pretibial or periankle edema.  No carotid bruit.   Abdomen: Soft, nontender, no distention.  Neurologic: Alert and oriented.  Psych: Normal affect. Skin: Normal. Musculoskeletal: No gross deformities.    ECG: Most recent ECG reviewed.      ASSESSMENT AND PLAN: 1. CAD: No anginal discomfort. Has SVG stenosis, for which medical therapy was recommended. Will continue Plavix, ASA, Zetia, nadolol, and Crestor.  2. Essential HTN: Controlled. No changes.  3. Hyperlipidemia: On Zetia and Crestor. Followed by PCP. Intolerant to Lipitor.   4. Paroxysmal atrial flutter/atrial fibrillation: Now paced. Symptomatically stable. He is a poor candidate for anticoagulation given portal hypertension with esophageal varices. Will continue ASA and Plavix for now. On nadolol.  5. Severe aortic stenosis s/p TAVR: Echocardiogram in 04/2015 showed a normally functioning prosthetic aortic valve and normal LV systolic function.   6. Chronic diastolic heart failure: Euvolemic. Continue torsemide prn.  7. Symptomatic bradycardia s/p pacemaker: Stable. Follows with Dr. Ladona Ridgel.  8. Orthostatic syncope: On midodrine. Needs to see GI.  Dispo: fu 6 months.  Time spent: 40 minutes, of which greater than 50% was spent reviewing symptoms, relevant blood tests and studies, and discussing management plan with the patient.   Prentice Docker, M.D., F.A.C.C.

## 2015-11-19 NOTE — Patient Instructions (Signed)

## 2015-12-04 ENCOUNTER — Encounter (INDEPENDENT_AMBULATORY_CARE_PROVIDER_SITE_OTHER): Payer: Self-pay | Admitting: Internal Medicine

## 2015-12-04 ENCOUNTER — Ambulatory Visit (INDEPENDENT_AMBULATORY_CARE_PROVIDER_SITE_OTHER): Payer: Medicare Other | Admitting: Internal Medicine

## 2015-12-04 VITALS — BP 110/80 | HR 66 | Temp 98.3°F | Ht 71.0 in | Wt 214.7 lb

## 2015-12-04 DIAGNOSIS — K7031 Alcoholic cirrhosis of liver with ascites: Secondary | ICD-10-CM

## 2015-12-04 DIAGNOSIS — K7682 Hepatic encephalopathy: Secondary | ICD-10-CM

## 2015-12-04 DIAGNOSIS — K7290 Hepatic failure, unspecified without coma: Secondary | ICD-10-CM

## 2015-12-04 DIAGNOSIS — I2510 Atherosclerotic heart disease of native coronary artery without angina pectoris: Secondary | ICD-10-CM | POA: Diagnosis not present

## 2015-12-04 NOTE — Patient Instructions (Signed)
Physician will call with results of blood tests when completed. 

## 2015-12-04 NOTE — Progress Notes (Signed)
Reason for consultation:  Management of decompensated liver disease.  History of present illness.:  Patient is 70 year old Caucasian male who is referred through courtesy of Dr. Donzetta Sprung for GI evaluation regarding cirrhosis which was diagnosed about 2 years ago. He has been seen at [facility] by Dr. Darrick Penna and associates. He did have EGD by Dr. Darrick Penna on 07/22/2015 revealing grade 1 esophageal varices and severe portal gastropathy. He is on nadolol for primary prophylaxis.  Patient now presents with abdominal distention which started about a month ago. He feels abdominal distention has not increased in the last week or so. He has not experienced abdominal pain fever or chills. He also denies melena or rectal bleeding. He has at least one bowel movement per day. According to his wife he has not been confused lately. 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 He is still having significant pain. He also complains of chronic back pain and is on pain medication. He also gives history of multiple herniae including left inguinal hernia but he is deemed to be too high risk for surgery. Continues to feel very weak. He has had low blood pressure. He therefore has been advised to liberalize salt intake and he is also on midodrine.   Current Medications: Outpatient Encounter Prescriptions as of 12/04/2015  Medication Sig  . albuterol-ipratropium (COMBIVENT) 18-103 MCG/ACT inhaler Inhale 2 puffs into the lungs every 6 (six) hours as needed for wheezing or shortness of breath.   . Artificial Tear Ointment (DRY EYES OP) Apply 1 drop to eye daily as needed (dry eyes).  Marland Kitchen ascorbic acid (VITAMIN C) 500 MG tablet Take 500 mg by mouth 2 (two) times daily.   Marland Kitchen aspirin 81 MG tablet Take 1 tablet (81 mg total) by mouth daily.  . cetirizine (ZYRTEC) 10 MG tablet Take 10 mg by mouth at bedtime.  . clopidogrel (PLAVIX) 75 MG tablet Take 1 tablet (75 mg total) by mouth  daily.  Marland Kitchen co-enzyme Q-10 30 MG capsule Take 30 mg by mouth at bedtime.   . CRESTOR 5 MG tablet Take 5 mg by mouth every other day. 3 day weekly  . desonide (DESOWEN) 0.05 % cream Apply 1 application topically 2 (two) times daily as needed (for rash).   . DULoxetine (CYMBALTA) 60 MG capsule Take 60 mg by mouth daily.  Marland Kitchen ezetimibe (ZETIA) 10 MG tablet Take 10 mg by mouth at bedtime.   . flunisolide (NASAREL) 29 MCG/ACT (0.025%) nasal spray Place 2 sprays into the nose at bedtime as needed for rhinitis (for congestion). Dose is for each nostril.  . insulin NPH-regular Human (NOVOLIN 70/30) (70-30) 100 UNIT/ML injection Inject 15 Units into the skin 2 (two) times daily. (Patient taking differently: Inject 15-20 Units into the skin 2 (two) times daily. 40 as a base; <180 = 40 units)  . ipratropium-albuterol (DUONEB) 0.5-2.5 (3) MG/3ML SOLN Take 3 mLs by nebulization every 6 (six) hours as needed (for shortness of breath).  Marland Kitchen ketoconazole (NIZORAL) 2 % cream Apply 1 application topically 2 (two) times daily as needed for irritation.   Marland Kitchen KRISTALOSE 20 G packet Take 10 g by mouth 2 (two) times daily as needed (diarrhea).   . loratadine (CLARITIN) 10 MG tablet Take 10 mg by mouth every morning.   . midodrine (PROAMATINE) 2.5 MG tablet Take 1 tablet (2.5 mg total) by mouth daily.  . Multiple Vitamin (MULTIVITAMIN) tablet Take 1 tablet by mouth daily.    . nadolol (CORGARD)  20 MG tablet Take 0.5 tablets (10 mg total) by mouth daily.  Marland Kitchen oxyCODONE-acetaminophen (PERCOCET/ROXICET) 5-325 MG per tablet Take 1-2 tablets by mouth every 4 (four) hours as needed for moderate pain. (Patient taking differently: Take 1 tablet by mouth every 4 (four) hours as needed for moderate pain. )  . pantoprazole (PROTONIX) 40 MG tablet 1 po 30 mins prior to first meal (Patient taking differently: Take 40 mg by mouth daily as needed (acid reflux). )  . potassium chloride SA (K-DUR,KLOR-CON) 20 MEQ tablet Take 1 tablet (20 mEq total)  by mouth 2 (two) times daily.  . predniSONE (DELTASONE) 20 MG tablet Take 5 mg by mouth daily.   . rifaximin (XIFAXAN) 550 MG TABS tablet Take 550 mg by mouth 2 (two) times daily.  Marland Kitchen torsemide (DEMADEX) 20 MG tablet Take 1 tablet (20 mg total) by mouth daily as needed. For edema / weight gain  . [DISCONTINUED] valACYclovir (VALTREX) 1000 MG tablet    No facility-administered encounter medications on file as of 12/04/2015.    Past Medical History:  Diagnosis Date  . Marland Kitchen  02/01/2014    12/30/2014  . Anemia    was on xalerto sept 2015 for afib - was taken off of - had to get unit of prbc  . Aortic stenosis    a. 2D ECHO: 01/30/2014: ef 50-55%. No RWMAs. Severe EP:PIRJJ area (VTI): 0.82 cm^2. Valve area (Vmax): 0.74 cm^2. Valve area (Vmean): 0.75 Cm^2. Mod LA/RA dilation. PA peak pressure 33  .  11/2014  . Asthma      . Atrial fibrillation (HCC) 12/30/2014  . CAD (coronary artery disease)   . CHB (complete heart block) (HCC)Status post pacemaker implant August 2016  12/30/2014  . Chronic atrial flutter (HCC)   . Chronic back pain    "lower back usually; upper back fior the last couple weeks"  (12/26/2014)  . Chronic diastolic congestive heart failure (HCC) 02/01/2014  . Chronic pancreatitis (HCC) 02/01/2014   Noted on CT scan  . Cirrhosis (HCC) 02/01/2014  .       Marland Kitchen COPD (chronic obstructive pulmonary disease) (HCC)   . Coronary atherosclerosis of artery bypass graft 2010  . Degenerative disc disease, cervical   . Degenerative disc disease, lumbar 2010      .       .    .  12/30/2014  .       Marland Kitchen GERD (gastroesophageal reflux disease)       .       Marland Kitchen Hyperlipidemia   . Hypertension     12/30/2014  . Morbid obesity (HCC)Peak weight 306 pounds  few years ago.    . On home oxygen therapy    "2.5L at night & prn during the day" (12/26/2014)  . Osteoarthritis    "everywhere"  .  dx'd 12/26/2014  .  "several times:  .  02/01/2014  . Rheumatoid arthritis(714.0)    "everywhere"  . S/P CABG  x 5 03/27/1997   LIMA to LAD, SVG to D1, SVG to OM1-OM2, SVG to RCA with open vein harvest from right thigh and leg  . 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 Shortness of breath   . Type II diabetes mellitus (HCC)     Allergies: Allergies  Allergen Reactions  . Xarelto [Rivaroxaban]  Caused bleeding from unknown source    Family history: Father died of renal cell carcinoma at age 49 with in 3 months of diagnosis. Mother is 63 years old and in fair health. Brother died at age 20 of diabetic complications. He has sister age 62 in good health.  Social history: He is married and accompanied by his wife Clydie Braun today. They have 5 children. One son age 93 is morbidly obese has diabetes and CAD. Other sons and 2 daughters are in good health. Patient is now retired. 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 He has never drank alcohol. He smoked 1-2 packs of cigarettes per day for about 35 years. He quit in 1998.    Physical examination: Blood pressure 110/80, pulse 66, temperature 98.3 F (36.8 C), temperature source Oral, height 5\' 11"  (1.803 m), weight 214 lb 11.2 oz (97.4 kg). Patient is alert and in no acute distress. He appears chronically ill. He is ambulating with cane and assistance.. He has fine macular rash over her forehead Conjunctiva is pink. Sclera is nonicteric Oropharyngeal mucosa is normal. He has complete upper and partial lower dental plates. No neck masses or thyromegaly noted. Maculopapular and vesicular rash noted along left thoracic dermatome. Cardiac exam with regular rhythm normal S1 and loud S2. No murmur or gallop noted. Lungs are clear to auscultation except breast sounds are diminished at left base. Abdomen is distended  but not tense. He has hernia in epigastric region to the right of midline which is completely reducible and nontender. He has umbilicus hernia which is smaller than the one in epigastric region and I left inguinal hernia which is soft but not reducible. Shifting dullness is present. No organomegaly or masses.  He has trace edema around ankles slightly more on the left side.  Labs/studies Results: Lab data from 10/02/2015  WBC 4.6, H&H 11.3 and 33.5 and platelet count 63K.  M sodium 134 potassium 3.5, chloride 102, CO2 27, glucose 172.  BUN 16 and creatinine 0.91  Serum calcium 8.3.  Bilirubin 1.4, AP 1:30, AST 65, ALT 56 and albumin 2.3 with total protein of 6.1.   Assessment:  #1. 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 Ascites is significant but not tense. Therefore will hold off abdominal tap. With history of hypotension escalating diuretic is not a good idea. It would result in dehydration and azotemia. If renal function is preserved may add low-dose spironolactone. His ascites may be well managed by periodic abdominal paracentesis. Hepatic encephalopathy appears to be under control with lactulose and Xifaxan. He had CT and she abdomen pelvis in March this ear. He therefore could wait for an ultrasound until after next visit. Patient's long-term prognosis is very poor and unfortunately he is not a candidate for transplant evaluation given his age and multiple core morbidities. If hypotension remains an issue nadolol could be discontinued. EGD 4 months ago revealed small varices and I believe risk of weight is so bleed is very low.  #2. Patient has umbilicus and ventral hernia as well as left and will hernia. He is high risk for surgery and is being observed.  Recommendations:  Patient will go the lab for CBC, comprehensive chemistry panel, INR and AFP. Patient will continue to monitor his weight at home. Patient will call office if  ascites becomes tense in which case he will need abdominal paracentesis. Office visit in 8 weeks.

## 2015-12-05 ENCOUNTER — Other Ambulatory Visit: Payer: Self-pay

## 2015-12-05 ENCOUNTER — Telehealth: Payer: Self-pay | Admitting: Internal Medicine

## 2015-12-05 DIAGNOSIS — K7030 Alcoholic cirrhosis of liver without ascites: Secondary | ICD-10-CM

## 2015-12-05 NOTE — Telephone Encounter (Signed)
Pt is on the Sept recall to have a U/S abdomen

## 2015-12-05 NOTE — Telephone Encounter (Signed)
Letter mailed

## 2015-12-11 LAB — COMPREHENSIVE METABOLIC PANEL
ALBUMIN: 2.3 g/dL — AB (ref 3.6–5.1)
ALT: 31 U/L (ref 9–46)
AST: 52 U/L — AB (ref 10–35)
Alkaline Phosphatase: 147 U/L — ABNORMAL HIGH (ref 40–115)
BILIRUBIN TOTAL: 1.9 mg/dL — AB (ref 0.2–1.2)
BUN: 13 mg/dL (ref 7–25)
CALCIUM: 8.7 mg/dL (ref 8.6–10.3)
CHLORIDE: 96 mmol/L — AB (ref 98–110)
CO2: 31 mmol/L (ref 20–31)
Creat: 1.13 mg/dL (ref 0.70–1.18)
Glucose, Bld: 128 mg/dL — ABNORMAL HIGH (ref 65–99)
POTASSIUM: 4.5 mmol/L (ref 3.5–5.3)
Sodium: 135 mmol/L (ref 135–146)
Total Protein: 6.6 g/dL (ref 6.1–8.1)

## 2015-12-11 LAB — CBC
HEMATOCRIT: 39.5 % (ref 38.5–50.0)
Hemoglobin: 13.1 g/dL — ABNORMAL LOW (ref 13.2–17.1)
MCH: 32.9 pg (ref 27.0–33.0)
MCHC: 33.2 g/dL (ref 32.0–36.0)
MCV: 99.2 fL (ref 80.0–100.0)
MPV: 11.3 fL (ref 7.5–12.5)
Platelets: 121 10*3/uL — ABNORMAL LOW (ref 140–400)
RBC: 3.98 MIL/uL — ABNORMAL LOW (ref 4.20–5.80)
RDW: 16.9 % — AB (ref 11.0–15.0)
WBC: 6.4 10*3/uL (ref 3.8–10.8)

## 2015-12-11 LAB — AFP TUMOR MARKER: AFP-Tumor Marker: 1.3 ng/mL (ref <6.1)

## 2015-12-11 LAB — PROTIME-INR
INR: 1.1
Prothrombin Time: 12.1 s — ABNORMAL HIGH (ref 9.0–11.5)

## 2015-12-15 ENCOUNTER — Telehealth: Payer: Self-pay | Admitting: Internal Medicine

## 2015-12-15 ENCOUNTER — Other Ambulatory Visit (INDEPENDENT_AMBULATORY_CARE_PROVIDER_SITE_OTHER): Payer: Self-pay | Admitting: Internal Medicine

## 2015-12-15 DIAGNOSIS — K7469 Other cirrhosis of liver: Secondary | ICD-10-CM

## 2015-12-15 DIAGNOSIS — R188 Other ascites: Secondary | ICD-10-CM

## 2015-12-15 IMAGING — CR DG CHEST 1V PORT SAME DAY
1 series · 1 of 1 positions shown · non-contrast
Comparison: CT and chest radiograph, 01/28/2014

CLINICAL DATA: CHF.

EXAM:
PORTABLE CHEST - 1 VIEW SAME DAY

[AP]
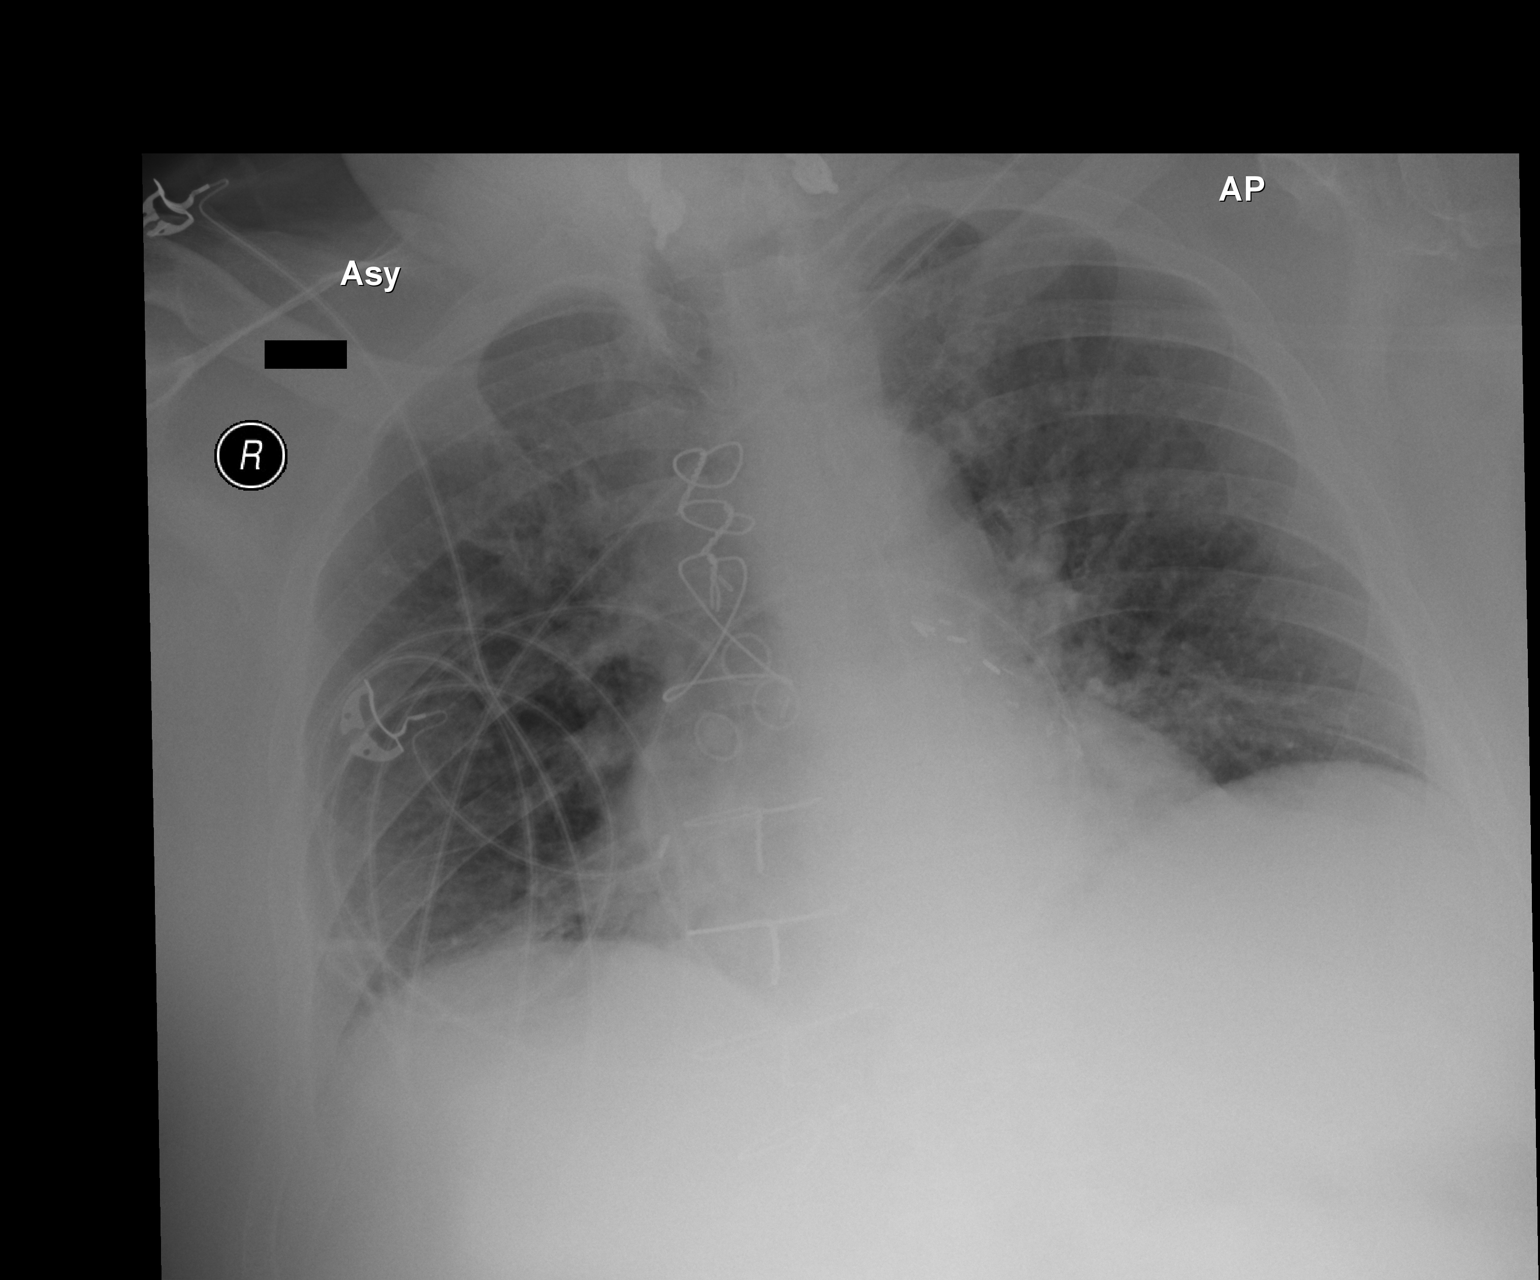

[1 of 1 positions shown; findings below may reference images not displayed]

FINDINGS: Changes from CABG surgery are stable. Cardiac silhouette is normal
in size and configuration. No convincing mediastinal or hilar
masses. There is mild vascular congestion. Right basilar opacity
likely combination of a small effusion and atelectasis. No overt
pulmonary edema or convincing infiltrate. No pneumothorax.
IMPRESSION: 1. Mild vascular congestion without overt pulmonary edema. No
convincing pneumonia. Small effusion with right lung base
atelectasis.

## 2015-12-15 NOTE — Telephone Encounter (Signed)
Patient wife is unsure of when the next remote check should be. Please call to confirm when she should plan to do this.

## 2015-12-15 NOTE — Telephone Encounter (Signed)
Returned wife's call. She was concerned that because they were unable to get in to see Dr. Ladona Ridgel until November that he needed to have a remote check prior to that time. I scheduled a remote check for 12/31/2015 to continue the 91 day checks and informed pt's wife. She will call with any other concerns.

## 2015-12-16 ENCOUNTER — Encounter (HOSPITAL_COMMUNITY): Payer: Self-pay

## 2015-12-16 ENCOUNTER — Ambulatory Visit (HOSPITAL_COMMUNITY)
Admission: RE | Admit: 2015-12-16 | Discharge: 2015-12-16 | Disposition: A | Discharge status: Home / Self Care | Payer: Medicare Other | Source: Ambulatory Visit | Attending: Internal Medicine | Admitting: Internal Medicine

## 2015-12-16 DIAGNOSIS — K7469 Other cirrhosis of liver: Secondary | ICD-10-CM | POA: Diagnosis present

## 2015-12-16 DIAGNOSIS — R188 Other ascites: Secondary | ICD-10-CM

## 2015-12-16 LAB — BODY FLUID CELL COUNT WITH DIFFERENTIAL
EOS FL: 0 %
LYMPHS FL: 82 %
MONOCYTE-MACROPHAGE-SEROUS FLUID: 12 % — AB (ref 50–90)
NEUTROPHIL FLUID: 6 % (ref 0–25)
WBC FLUID: 95 uL (ref 0–1000)

## 2015-12-16 LAB — PROTEIN, BODY FLUID: Total protein, fluid: <3 g/dL

## 2015-12-16 LAB — GRAM STAIN

## 2015-12-16 LAB — GLUCOSE, SEROUS FLUID: GLUCOSE FL: 174 mg/dL

## 2015-12-16 NOTE — Progress Notes (Signed)
Paracentesis complete no signs of distress. 4000 ml yellow colored ascites removed.  

## 2015-12-17 LAB — PATHOLOGIST SMEAR REVIEW

## 2015-12-17 LAB — ACID FAST SMEAR (AFB, MYCOBACTERIA): Acid Fast Smear: NEGATIVE

## 2015-12-22 LAB — CULTURE, BODY FLUID-BOTTLE: CULTURE: NO GROWTH

## 2015-12-22 LAB — CULTURE, BODY FLUID W GRAM STAIN -BOTTLE

## 2015-12-23 ENCOUNTER — Other Ambulatory Visit: Payer: Self-pay

## 2015-12-23 ENCOUNTER — Encounter (HOSPITAL_COMMUNITY): Payer: Self-pay | Admitting: *Deleted

## 2015-12-23 ENCOUNTER — Emergency Department (HOSPITAL_COMMUNITY): Payer: Medicare Other

## 2015-12-23 ENCOUNTER — Emergency Department (HOSPITAL_COMMUNITY)
Admission: EM | Admit: 2015-12-23 | Discharge: 2015-12-23 | Disposition: A | Discharge status: Home / Self Care | Payer: Medicare Other | Attending: Emergency Medicine | Admitting: Emergency Medicine

## 2015-12-23 DIAGNOSIS — Z23 Encounter for immunization: Secondary | ICD-10-CM | POA: Diagnosis not present

## 2015-12-23 DIAGNOSIS — Z79899 Other long term (current) drug therapy: Secondary | ICD-10-CM | POA: Diagnosis not present

## 2015-12-23 DIAGNOSIS — Z950 Presence of cardiac pacemaker: Secondary | ICD-10-CM | POA: Insufficient documentation

## 2015-12-23 DIAGNOSIS — I4892 Unspecified atrial flutter: Secondary | ICD-10-CM | POA: Insufficient documentation

## 2015-12-23 DIAGNOSIS — J45909 Unspecified asthma, uncomplicated: Secondary | ICD-10-CM | POA: Insufficient documentation

## 2015-12-23 DIAGNOSIS — Z794 Long term (current) use of insulin: Secondary | ICD-10-CM | POA: Diagnosis not present

## 2015-12-23 DIAGNOSIS — I4891 Unspecified atrial fibrillation: Secondary | ICD-10-CM | POA: Diagnosis not present

## 2015-12-23 DIAGNOSIS — Y999 Unspecified external cause status: Secondary | ICD-10-CM | POA: Insufficient documentation

## 2015-12-23 DIAGNOSIS — Z87891 Personal history of nicotine dependence: Secondary | ICD-10-CM | POA: Diagnosis not present

## 2015-12-23 DIAGNOSIS — S065X9A Traumatic subdural hemorrhage with loss of consciousness of unspecified duration, initial encounter: Secondary | ICD-10-CM

## 2015-12-23 DIAGNOSIS — W182XXA Fall in (into) shower or empty bathtub, initial encounter: Secondary | ICD-10-CM | POA: Diagnosis not present

## 2015-12-23 DIAGNOSIS — E119 Type 2 diabetes mellitus without complications: Secondary | ICD-10-CM | POA: Diagnosis not present

## 2015-12-23 DIAGNOSIS — J449 Chronic obstructive pulmonary disease, unspecified: Secondary | ICD-10-CM | POA: Insufficient documentation

## 2015-12-23 DIAGNOSIS — S0990XA Unspecified injury of head, initial encounter: Secondary | ICD-10-CM | POA: Diagnosis present

## 2015-12-23 DIAGNOSIS — S0101XA Laceration without foreign body of scalp, initial encounter: Secondary | ICD-10-CM | POA: Diagnosis not present

## 2015-12-23 DIAGNOSIS — S065X0A Traumatic subdural hemorrhage without loss of consciousness, initial encounter: Secondary | ICD-10-CM | POA: Diagnosis not present

## 2015-12-23 DIAGNOSIS — Y93E1 Activity, personal bathing and showering: Secondary | ICD-10-CM | POA: Insufficient documentation

## 2015-12-23 DIAGNOSIS — Z7982 Long term (current) use of aspirin: Secondary | ICD-10-CM | POA: Insufficient documentation

## 2015-12-23 DIAGNOSIS — I110 Hypertensive heart disease with heart failure: Secondary | ICD-10-CM | POA: Diagnosis not present

## 2015-12-23 DIAGNOSIS — I2510 Atherosclerotic heart disease of native coronary artery without angina pectoris: Secondary | ICD-10-CM | POA: Insufficient documentation

## 2015-12-23 DIAGNOSIS — W19XXXA Unspecified fall, initial encounter: Secondary | ICD-10-CM

## 2015-12-23 DIAGNOSIS — S40021A Contusion of right upper arm, initial encounter: Secondary | ICD-10-CM | POA: Insufficient documentation

## 2015-12-23 DIAGNOSIS — S40811A Abrasion of right upper arm, initial encounter: Secondary | ICD-10-CM

## 2015-12-23 DIAGNOSIS — Y92002 Bathroom of unspecified non-institutional (private) residence single-family (private) house as the place of occurrence of the external cause: Secondary | ICD-10-CM | POA: Diagnosis not present

## 2015-12-23 DIAGNOSIS — I5032 Chronic diastolic (congestive) heart failure: Secondary | ICD-10-CM | POA: Insufficient documentation

## 2015-12-23 DIAGNOSIS — S065XAA Traumatic subdural hemorrhage with loss of consciousness status unknown, initial encounter: Secondary | ICD-10-CM

## 2015-12-23 LAB — CBC WITH DIFFERENTIAL/PLATELET
BASOS PCT: 1 %
Basophils Absolute: 0.1 10*3/uL (ref 0.0–0.1)
Eosinophils Absolute: 0.2 10*3/uL (ref 0.0–0.7)
Eosinophils Relative: 2 %
HEMATOCRIT: 40.3 % (ref 39.0–52.0)
Hemoglobin: 13.4 g/dL (ref 13.0–17.0)
LYMPHS PCT: 7 %
Lymphs Abs: 0.5 10*3/uL — ABNORMAL LOW (ref 0.7–4.0)
MCH: 33.8 pg (ref 26.0–34.0)
MCHC: 33.3 g/dL (ref 30.0–36.0)
MCV: 101.5 fL — AB (ref 78.0–100.0)
MONO ABS: 1.1 10*3/uL — AB (ref 0.1–1.0)
MONOS PCT: 15 %
NEUTROS ABS: 5.6 10*3/uL (ref 1.7–7.7)
Neutrophils Relative %: 75 %
Platelets: 121 10*3/uL — ABNORMAL LOW (ref 150–400)
RBC: 3.97 MIL/uL — ABNORMAL LOW (ref 4.22–5.81)
RDW: 18.1 % — AB (ref 11.5–15.5)
WBC: 7.5 10*3/uL (ref 4.0–10.5)

## 2015-12-23 LAB — BASIC METABOLIC PANEL
ANION GAP: 8 (ref 5–15)
BUN: 19 mg/dL (ref 6–20)
CALCIUM: 9.4 mg/dL (ref 8.9–10.3)
CO2: 23 mmol/L (ref 22–32)
Chloride: 102 mmol/L (ref 101–111)
Creatinine, Ser: 1.33 mg/dL — ABNORMAL HIGH (ref 0.61–1.24)
GFR calc Af Amer: >60 mL/min (ref >60)
GFR calc non Af Amer: 52 mL/min — ABNORMAL LOW (ref >60)
GLUCOSE: 154 mg/dL — AB (ref 65–99)
Potassium: 5 mmol/L (ref 3.5–5.1)
Sodium: 133 mmol/L — ABNORMAL LOW (ref 135–145)

## 2015-12-23 MED ORDER — TETANUS-DIPHTH-ACELL PERTUSSIS 5-2.5-18.5 LF-MCG/0.5 IM SUSP
0.5000 mL | Freq: Once | INTRAMUSCULAR | Status: AC
  Administered 2015-12-23: 0.5 mL via INTRAMUSCULAR
  Filled 2015-12-23: qty 0.5

## 2015-12-23 MED ORDER — HYDROGEN PEROXIDE 3 % EX SOLN
CUTANEOUS | Status: AC
  Administered 2015-12-23: 1
  Filled 2015-12-23: qty 473

## 2015-12-23 MED ORDER — SODIUM CHLORIDE 0.9 % IV BOLUS (SEPSIS)
500.0000 mL | Freq: Once | INTRAVENOUS | Status: AC
  Administered 2015-12-23: 500 mL via INTRAVENOUS

## 2015-12-23 MED ORDER — ONDANSETRON HCL 8 MG PO TABS: 8.0000 mg | ORAL_TABLET | Freq: Three times a day (TID) | ORAL | 0 refills | Status: DC | PRN

## 2015-12-23 NOTE — Discharge Instructions (Signed)
Get plenty of rest and drink fluids.  Return here if needed, for problems indicative of worsening head injury.  We are going to have home health services, evaluate you in the home setting, to see if additional care needs to be given.

## 2015-12-23 NOTE — ED Provider Notes (Signed)
[facility]Y DEPT Provider Note   CSN: [id number] Arrival date & time: 12/23/15  1639     History   Chief Complaint Chief Complaint  Patient presents with  . Laceration    HPI [name] is a 70 y.o. male.  He presents for evaluation of injuries from fall. He states that he was standing, and fell backwards striking his head. He feels like he fell because his balance has been off for several months. His wife was able to summon help, by an ambulance. Complains of scrapes in his right arm. He denies other preceding symptoms. He denies nausea, vomiting, but has not been eating as well as usual. He denies injuries to back, arms, chest, or legs. He is taking his usual medications including Plavix. There are no other known modifying factors.  HPI  Past Medical History:  Diagnosis Date  . Acute on chronic diastolic heart failure (HCC) 02/01/2014  . Acute renal failure (HCC) 12/30/2014  . Anemia    was on xalerto sept 2015 for afib - was taken off of - had to get unit of prbc  . Aortic stenosis    a. 2D ECHO: 01/30/2014: ef 50-55%. No RWMAs. Severe OI:ZTIWP area (VTI): 0.82 cm^2. Valve area (Vmax): 0.74 cm^2. Valve area (Vmean): 0.75 Cm^2. Mod LA/RA dilation. PA peak pressure 33  . ARF (acute renal failure) (HCC) 11/2014  . Asthma    uses nebulizers daily  . Atrial fibrillation (HCC) 12/30/2014  . CAD (coronary artery disease)   . CHB (complete heart block) (HCC) 12/30/2014  . Chronic atrial flutter (HCC)   . Chronic back pain    "lower back usually; upper back fior the last couple weeks"  (12/26/2014)  . Chronic diastolic congestive heart failure (HCC) 02/01/2014  . Chronic pancreatitis (HCC) 02/01/2014   Noted on CT scan  . Cirrhosis (HCC) 02/01/2014  . Complication of anesthesia    "has a hard time breathing when he wakes up"  . COPD (chronic obstructive pulmonary disease) (HCC)   . Coronary atherosclerosis of artery bypass graft 2010  . Degenerative disc disease, cervical     . Degenerative disc disease, lumbar 2010  . Dizziness   . Dysrhythmia    fib/flutter  . Elevated liver function tests   . Elevated troponin level, secondary to CHB acute renal failure 12/30/2014  . Epistaxis    Developed on Xarelto therapy  . GERD (gastroesophageal reflux disease)   . Heart murmur   . History of blood transfusion 01/2014   "right after starting Xeralto"  . Hyperlipidemia   . Hypertension   . Hyposmolality and/or hyponatremia 12/30/2014  . Morbid obesity (HCC)   . On home oxygen therapy    "2.5L at night & prn during the day" (12/26/2014)  . Osteoarthritis    "everywhere"  . Other myositis, left thigh dx'd 12/26/2014  . Pneumonia "several times:  . Portal hypertension (HCC) 02/01/2014  . Rheumatoid arthritis(714.0)    "everywhere"  . 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 Shortness of breath   . Type II diabetes mellitus [facility])     Patient Active Problem List   Diagnosis Date Noted  .  Orthostatic hypotension 10/02/2015  . Syncope 10/02/2015  . Loss of weight 07/09/2015  . Acute renal failure (HCC) 12/30/2014  . Atrial fibrillation, chronic 12/30/2014  . 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 Increased ammonia level 12/30/2014  . Hyposmolality and/or hyponatremia 12/30/2014  . CHB (complete heart block) (HCC) 12/30/2014  . Elevated troponin level, secondary to CHB acute renal failure 12/30/2014  . Mental status alteration, due to bradycardia and hepatic encephalopathy now improved 12/30/2014  . Symptomatic bradycardia 12/26/2014  . Chronic respiratory failure (HCC) 11/03/2014  . Thrombocytopenia (HCC)  10/30/2014  . Ataxia 10/29/2014  . 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 Severe aortic valve stenosis 04/03/2014  . Chronic periodontitis 03/12/2014  . Acute on chronic diastolic heart failure (HCC) 02/01/2014  . Chronic diastolic congestive heart failure (HCC) 02/01/2014  . Iron deficiency anemia 02/01/2014  . Cirrhosis (HCC) 02/01/2014  . Portal hypertension (HCC) 02/01/2014  . Chronic pancreatitis (HCC) 02/01/2014  . Degenerative disc disease, lumbar   . Epistaxis   . Chronic blood loss anemia 01/31/2014  . Morbid obesity (HCC)   . CHF (congestive heart failure) (HCC) 01/29/2014  . COPD GOLD III with reversibility  04/30/2013  . Dyspnea 03/23/2013  . Rheumatoid arthritis (HCC) 03/23/2013  . Aortic stenosis 03/16/2013  . HTN (hypertension) 03/16/2013  . Hyperlipidemia 03/16/2013  . LIVER FUNCTION TESTS, ABNORMAL, HX OF 02/25/2010  . Coronary atherosclerosis of artery bypass graft 01/15/2009  . Atrial flutter (HCC) 01/15/2009  . Sciatica 01/15/2009  . 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 ANTERIOR CERVICAL DECOMP/DISCECTOMY FUSION  12/2006  . APPENDECTOMY  1950  . BACK SURGERY    . COLONOSCOPY WITH PROPOFOL N/A 02/01/2014   Sigmoid diverticulosis   . CORONARY ANGIOPLASTY WITH STENT PLACEMENT  01/02/2009   s/p bare metal stent to the vein graft to the RCA 9/20. nml LV function   . CORONARY ARTERY BYPASS GRAFT  03/27/1997   CABG x 5 by Dr Cornelius Moras  . EP IMPLANTABLE DEVICE N/A 12/27/2014   Procedure: Pacemaker Implant;  Surgeon: Marinus Maw, MD;  Location: [facility] INVASIVE CV LAB;  Service: Cardiovascular;  Laterality: N/A;  . ESOPHAGOGASTRODUODENOSCOPY (EGD) WITH PROPOFOL N/A 02/01/2014   Dr. Bosie Clos: small distal esophageal varices and mild portal gastropathy  . ESOPHAGOGASTRODUODENOSCOPY (EGD) WITH PROPOFOL N/A 07/22/2015   Dr. Darrick Penna: grade 1 varices, portal gastropathy, Nadolol started. Repeat EGD in 1-2 years  to reassess varices and possible banding   . INSERT / REPLACE / REMOVE PACEMAKER    . INTRAOPERATIVE TRANSESOPHAGEAL ECHOCARDIOGRAM N/A 04/03/2014   Procedure: INTRAOPERATIVE TRANSESOPHAGEAL ECHOCARDIOGRAM;  Surgeon: Purcell Nails, MD;  Location[facility];  Service: Open Heart Surgery;  Laterality: N/A;  . LEFT AND RIGHT HEART CATHETERIZATION WITH CORONARY/GRAFT ANGIOGRAM N/A 02/05/2014   Procedure: LEFT AND RIGHT HEART CATHETERIZATION WITH Isabel Caprice;  Surgeon: Peter M Swaziland, MD;  Location: [facility] CATH LAB;  Service: Cardiovascular;  Laterality: N/A;  . LUMBAR DISC SURGERY  2011   "put plastic wedge in"  . MULTIPLE EXTRACTIONS WITH ALVEOLOPLASTY N/A 03/12/2014   Procedure: MULTIPLE EXTRACTION OF TOOTH #'S 2,6,7,8,9,10,11,14,18,20,29,31 WITH ALVEOLOPLASTY AND GROSS DEBRIDMENT OF REMAINING TEETH;  Surgeon: Charlynne Pander, DDS;  Location: [facility] OR;  Service: Oral Surgery;  Laterality: N/A;  . POSTERIOR LAMINECTOMY / DECOMPRESSION CERVICAL SPINE  05/2007  . SAVORY DILATION N/A 07/22/2015   Procedure: SAVORY DILATION;  Surgeon: West Bali, MD;  Location:  [facility] ENDO SUITE;  Service: Endoscopy;  Laterality: N/A;  . TONSILLECTOMY AND ADENOIDECTOMY  1950  . TRANSCATHETER AORTIC VALVE REPLACEMENT, TRANSFEMORAL N/A 04/03/2014   Procedure: TRANSCATHETER AORTIC VALVE REPLACEMENT, TRANSFEMORAL;  Surgeon: Purcell Nails, MD;  Location: [facility] OR;  Service: Open Heart Surgery;  Laterality: N/A;       Home Medications    Prior to Admission medications   Medication Sig Start Date End Date Taking? Authorizing Provider  albuterol-ipratropium (COMBIVENT) 18-103 MCG/ACT inhaler Inhale 2 puffs into the lungs every 6 (six) hours as needed for wheezing or shortness of breath.    Yes Historical Provider, MD  ascorbic acid (VITAMIN C) 500 MG tablet Take 500 mg by mouth 2 (two) times daily.    Yes Historical Provider, MD  aspirin 81 MG tablet Take 1 tablet (81 mg total) by mouth daily. 02/07/14  Yes Rhonda G  Barrett, PA-C  cetirizine (ZYRTEC) 10 MG tablet Take 10 mg by mouth at bedtime.   Yes Historical Provider, MD  clopidogrel (PLAVIX) 75 MG tablet Take 1 tablet (75 mg total) by mouth daily. 03/10/15  Yes Laqueta Linden, MD  co-enzyme Q-10 30 MG capsule Take 30 mg by mouth at bedtime.    Yes Historical Provider, MD  CRESTOR 5 MG tablet Take 5 mg by mouth every other day. 3 day weekly 04/11/15  Yes Historical Provider, MD  desonide (DESOWEN) 0.05 % cream Apply 1 application topically 2 (two) times daily as needed (for rash).    Yes Historical Provider, MD  DULoxetine (CYMBALTA) 60 MG capsule Take 60 mg by mouth daily. 09/10/15  Yes Historical Provider, MD  ezetimibe (ZETIA) 10 MG tablet Take 10 mg by mouth at bedtime.    Yes Historical Provider, MD  flunisolide (NASAREL) 29 MCG/ACT (0.025%) nasal spray Place 2 sprays into the nose at bedtime as needed for rhinitis (for congestion). Dose is for each nostril.   Yes Historical Provider, MD  gabapentin (NEURONTIN) 300 MG capsule Take 300 mg by mouth 2 (two) times daily. 12/12/15  Yes Historical Provider, MD  insulin NPH-regular Human (NOVOLIN 70/30) (70-30) 100 UNIT/ML injection Inject 15 Units into the skin 2 (two) times daily. Patient taking differently: Inject 15-20 Units into the skin 2 (two) times daily. 40 as a base; <180 = 40 units 11/04/14  Yes Erick Blinks, MD  ipratropium-albuterol (DUONEB) 0.5-2.5 (3) MG/3ML SOLN Take 3 mLs by nebulization 2 (two) times daily.    Yes Historical Provider, MD  KRISTALOSE 20 G packet Take 10 g by mouth 2 (two) times daily as needed (diarrhea).  12/15/14  Yes Historical Provider, MD  loratadine (CLARITIN) 10 MG tablet Take 10 mg by mouth every morning.    Yes Historical Provider, MD  midodrine (PROAMATINE) 2.5 MG tablet Take 1 tablet (2.5 mg total) by mouth daily. 10/10/15  Yes Jodelle Gross, NP  Multiple Vitamin (MULTIVITAMIN) tablet Take 1 tablet by mouth daily.     Yes Historical Provider, MD    oxyCODONE-acetaminophen (PERCOCET/ROXICET) 5-325 MG per tablet Take 1-2 tablets by mouth every 4 (four) hours as needed for moderate pain. Patient taking differently: Take 1 tablet by mouth every 4 (four) hours as needed for moderate pain.  03/12/14  Yes Charlynne Pander, DDS  pantoprazole (PROTONIX) 40 MG tablet 1 po 30 mins prior to first meal Patient taking differently: Take 40 mg by mouth daily.  07/22/15  Yes West Bali, MD  potassium chloride SA (K-DUR,KLOR-CON) 20 MEQ tablet Take 1 tablet (20  mEq total) by mouth 2 (two) times daily. 05/01/15  Yes Laqueta Linden, MD  predniSONE (DELTASONE) 20 MG tablet Take 5 mg by mouth daily.  11/18/15  Yes Historical Provider, MD  rifaximin (XIFAXAN) 550 MG TABS tablet Take 550 mg by mouth 2 (two) times daily.   Yes Historical Provider, MD  torsemide (DEMADEX) 20 MG tablet Take 1 tablet (20 mg total) by mouth daily as needed. For edema / weight gain 10/03/15  Yes Costin Otelia Sergeant, MD  nadolol (CORGARD) 20 MG tablet Take 0.5 tablets (10 mg total) by mouth daily. Patient not taking: Reported on 12/23/2015 11/19/15   Laqueta Linden, MD    Family History Family History  Problem Relation Age of Onset  . Hypertension Mother   . Cancer Father   . Stroke Brother   . Liver cancer Paternal Grandfather   . Rheum arthritis Maternal Aunt   . Asthma Child   . Emphysema Brother     smoked  . Colon cancer Neg Hx     Social History Social History  Substance Use Topics  . Smoking status: Former Smoker    Packs/day: 3.00    Years: 35.00    Types: Cigarettes    Start date: 09/15/1961    Quit date: 11/23/1996  . Smokeless tobacco: Current User    Types: Chew  . Alcohol use No     Allergies   Xarelto [rivaroxaban]   Review of Systems Review of Systems  All other systems reviewed and are negative.    Physical Exam Updated Vital Signs BP 161/69 (BP Location: Left Arm)   Pulse 66   Temp 97.5 F (36.4 C) (Axillary)   Resp 18   Ht 5'  11" (1.803 m)   Wt 210 lb (95.3 kg)   SpO2 97%   BMI 29.29 kg/m   Physical Exam  Constitutional: He is oriented to person, place, and time. He appears well-developed.  Frail, appears older than stated age.  HENT:  Head: Normocephalic.  Right Ear: External ear normal.  Left Ear: External ear normal.  Contusion and laceration right parietal.  Eyes: Conjunctivae and EOM are normal. Pupils are equal, round, and reactive to light.  Neck: Normal range of motion and phonation normal. Neck supple.  Cardiovascular: Normal rate, regular rhythm and normal heart sounds.   Systolic hypertension.  Pulmonary/Chest: Effort normal and breath sounds normal. He exhibits no tenderness (No crepitation or instability) and no bony tenderness.  Abdominal: Soft. He exhibits distension. There is no guarding.  Musculoskeletal: Normal range of motion.  Superficial abrasions, upper and lower right forearm. Multiple scattered contusions of the right arm.  Neurological: He is alert and oriented to person, place, and time. No cranial nerve deficit or sensory deficit. He exhibits normal muscle tone. Coordination normal.  Skin: Skin is warm, dry and intact.  Psychiatric: He has a normal mood and affect. His behavior is normal. Judgment and thought content normal.  Nursing note and vitals reviewed.    ED Treatments / Results  Labs (all labs ordered are listed, but only abnormal results are displayed) Labs Reviewed  BASIC METABOLIC PANEL - Abnormal; Notable for the following:       Result Value   Sodium 133 (*)    Glucose, Bld 154 (*)    Creatinine, Ser 1.33 (*)    GFR calc non Af Amer 52 (*)    All other components within normal limits  CBC WITH DIFFERENTIAL/PLATELET - Abnormal; Notable for the following:  RBC 3.97 (*)    MCV 101.5 (*)    RDW 18.1 (*)    Platelets 121 (*)    Lymphs Abs 0.5 (*)    Monocytes Absolute 1.1 (*)    All other components within normal limits  URINALYSIS, ROUTINE W REFLEX  MICROSCOPIC (NOT [facility])    EKG  EKG Interpretation None       Radiology Dg Chest 2 View  Result Date: 12/23/2015 CLINICAL DATA:  Patient status post fall in the bathtub. Shortness of breath. EXAM: CHEST  2 VIEW COMPARISON:  Chest radiograph 10/01/2015 FINDINGS: Low lung volumes. Left-greater-than-right basilar heterogeneous opacities. No pleural effusion. No pneumothorax. Mid thoracic spine degenerative changes. Single lead pacer apparatus overlies the left hemi thorax. Nonspecific radiodensity projecting over the left lower lateral hemi thorax. Healing posterior right sixth and seventh rib fractures. IMPRESSION: Low lung volumes with left-greater-than-right basilar airspace opacities favored represent atelectasis. Consider repeat evaluation with improved aeration. Electronically Signed   By: Annia Beltrew  Davis M.D.   On: 12/23/2015 18:21    Procedures .Marland Kitchen.Laceration Repair Date/Time: 12/23/2015 7:47 PM Performed by: Mancel BaleWENTZ, Tamelia Michalowski Authorized by: Mancel BaleWENTZ, Loralyn Rachel     LACERATION REPAIR Performed by: Flint MelterWENTZ,Rondalyn Belford L Consent: Verbal consent obtained. Risks and benefits: risks, benefits and alternatives were discussed Patient identity confirmed: provided demographic data Time out performed prior to procedure Prepped and Draped in normal sterile fashion Wound explored Laceration Location: right parietal Laceration Length: 3.0cm No Foreign Bodies seen or palpated  Irrigation method: saline on gauze Amount of cleaning: standard Skin closure: staples Number of sutures or staples: 3  Patient tolerance: Patient tolerated the procedure well with no immediate complications.        (including critical care time)  Medications Ordered in ED Medications  sodium chloride 0.9 % bolus 500 mL (500 mLs Intravenous New Bag/Given 12/23/15 1750)     Initial Impression / Assessment and Plan / ED Course  I have reviewed the triage vital signs and the nursing notes.  Pertinent labs & imaging  results that were available during my care of the patient were reviewed by me and considered in my medical decision making (see chart for details).  Clinical Course  Comment By Time  Discussed CT results, with radiologist. No shift, secondary to subdural hematoma. Mancel BaleElliott Antowan Samford, MD 08/22 1907  Radiologic imaging report reviewed and images by CT  - viewed, by me.  Mancel BaleElliott Raney Koeppen, MD 08/22 1907  He continues to be comfortable, alert and cooperative. No change in neurologic status. Wife is here now and she was updated on findings. Mancel BaleElliott Minola Guin, MD 08/22 1946   19:50- Consult neurosurgery  Medications  Tdap (BOOSTRIX) injection 0.5 mL (not administered)  hydrogen peroxide 3 % external solution (not administered)  sodium chloride 0.9 % bolus 500 mL (500 mLs Intravenous New Bag/Given 12/23/15 1750)    Patient Vitals for the past 24 hrs:  BP Temp Temp src Pulse Resp SpO2 Height Weight  12/23/15 1730 145/67 - - (!) 56 - 100 % - -  12/23/15 1700 161/63 - - 61 - 99 % - -  12/23/15 1650 - - - - - - 5\' 11"  (1.803 m) 210 lb (95.3 kg)  12/23/15 1647 161/69 97.5 F (36.4 C) Axillary 66 18 97 % - -  12/23/15 1643 - - - - - 97 % - -    20:10- days. Discussed with neurosurgery, Dr. Patric DykesNunkumar. He reviewed the films. He states that the patient is stable and does not need follow-up evaluations, by  neurosurgery or any planned follow-up scans. He states that the patient should be entertained off Plavix for now until he sees his cardiologist.   8:56 PM Reevaluation with update and discussion. After initial assessment and treatment, an updated evaluation reveals patient remains stable, alert, with a unchanged neurologic exam. Findings discussed with patient and wife. 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 He primarily moves from bed to chair or chair to rolling walker with a seat on it, on his own. She is able to assist him with these movements. She understands the findings and is in agreement  with his discharge at this time. She agrees with in-home evaluation by [facility] health, [facility], to assist with care, and reevaluate his physical therapy needs. All questions were answered.Mancel Bale L   CRITICAL CARE Performed by: Flint Melter Total critical care time: 40 minutes Critical care time was exclusive of separately billable procedures and treating other patients. Critical care was necessary to treat or prevent imminent or life-threatening deterioration. Critical care was time spent personally by me on the following activities: development of treatment plan with patient and/or surrogate as well as nursing, discussions with consultants, evaluation of patient's response to treatment, examination of patient, obtaining history from patient or surrogate, ordering and performing treatments and interventions, ordering and review of laboratory studies, ordering and review of radiographic studies, pulse oximetry and re-evaluation of patient's condition.   Final Clinical Impressions(s) / ED Diagnoses   Final diagnoses:  Subdural hematoma (HCC)  Fall, initial encounter  Scalp laceration, initial encounter  Abrasion of arm, right, initial encounter  Contusion of arm, right, initial encounter    Fall with head injury, and small subdural. Patient stable clinically during period of ED observation. He is on Plavix, which can potentiate bleeding.Plavix will be discontinued because the risk of bleeding is greater than the risk of stroke from atrial fibrillation. Patient's wife understands that he needs to follow-up with his cardiologist regarding an appropriate time to reinitiate the Plavix. Screening medical exam does not indicate any acute disorder. His fall is likely from his chronic weakness and instability.    Nursing Notes Reviewed/ Care Coordinated Applicable Imaging Reviewed Interpretation of Laboratory Data incorporated into ED treatment  The patient appears reasonably screened  and/or stabilized for discharge and I doubt any other medical condition or other [facility] requiring further screening, evaluation, or treatment in the ED at this time prior to discharge.  Plan: Home Medications- stop Plavix, continue others; Home Treatments- rest, fluids; return here if the recommended treatment, does not improve the symptoms; Recommended follow up- PCP 1 week, and in-home health services as soon as possible. Using the company [facility]    New Prescriptions Current Discharge Medication List       Mancel Bale, MD 12/23/15 2107

## 2015-12-29 ENCOUNTER — Telehealth (INDEPENDENT_AMBULATORY_CARE_PROVIDER_SITE_OTHER): Payer: Self-pay | Admitting: *Deleted

## 2015-12-29 NOTE — Telephone Encounter (Signed)
Patient's wife left message -- he needs to have fluid drawn off -- please advise if ok --

## 2015-12-30 ENCOUNTER — Other Ambulatory Visit: Payer: Self-pay

## 2015-12-30 ENCOUNTER — Telehealth: Payer: Self-pay | Admitting: Cardiovascular Disease

## 2015-12-30 NOTE — Telephone Encounter (Signed)
Pt aware and appt made with Herma Carson PA in [place] office 9/6

## 2015-12-30 NOTE — Telephone Encounter (Signed)
Would like to know when he is suppose to start back on his plavix

## 2015-12-30 NOTE — Telephone Encounter (Signed)
Pt was in ED for fall and plavix was d/c'd for risk of bleeding, provider notes indicate that pt f/u with cardiology for when pt could resume plavix. Will forward to covering provider

## 2015-12-30 NOTE — Telephone Encounter (Signed)
Would recommend OV to further discuss if we can arrange.  Dayna Dunn PA-C

## 2015-12-31 ENCOUNTER — Other Ambulatory Visit (INDEPENDENT_AMBULATORY_CARE_PROVIDER_SITE_OTHER): Payer: Self-pay | Admitting: *Deleted

## 2015-12-31 ENCOUNTER — Telehealth: Payer: Self-pay | Admitting: Cardiology

## 2015-12-31 ENCOUNTER — Encounter: Payer: Medicare Other | Admitting: *Deleted

## 2015-12-31 DIAGNOSIS — R188 Other ascites: Secondary | ICD-10-CM

## 2015-12-31 NOTE — Telephone Encounter (Signed)
Ok to schedule [facility]AP

## 2015-12-31 NOTE — Telephone Encounter (Signed)
Confirmed remote transmission w/ pt wife.   

## 2015-12-31 NOTE — Telephone Encounter (Signed)
US paracentesis sch'd 01/01/16 at 11:00 (1045), left detailed message for patient

## 2016-01-01 ENCOUNTER — Ambulatory Visit (HOSPITAL_COMMUNITY)
Admission: RE | Admit: 2016-01-01 | Discharge: 2016-01-01 | Disposition: A | Discharge status: Home / Self Care | Payer: Medicare Other | Source: Ambulatory Visit | Attending: Internal Medicine | Admitting: Internal Medicine

## 2016-01-01 DIAGNOSIS — K7460 Unspecified cirrhosis of liver: Secondary | ICD-10-CM | POA: Diagnosis not present

## 2016-01-01 DIAGNOSIS — R188 Other ascites: Secondary | ICD-10-CM | POA: Diagnosis present

## 2016-01-01 LAB — BODY FLUID CELL COUNT WITH DIFFERENTIAL
EOS FL: 0 %
Lymphs, Fluid: 59 %
MONOCYTE-MACROPHAGE-SEROUS FLUID: 15 % — AB (ref 50–90)
Neutrophil Count, Fluid: 26 % — ABNORMAL HIGH (ref 0–25)
Total Nucleated Cell Count, Fluid: 69 cu mm (ref 0–1000)

## 2016-01-01 LAB — GRAM STAIN

## 2016-01-01 NOTE — Progress Notes (Signed)
Paracentesis complete no signs of distress; 6L yellow colored ascites removed.  

## 2016-01-01 NOTE — Procedures (Signed)
PreOperative Dx: Cirrhosis, ascites Postoperative Dx: Cirrhosis, ascites Procedure:   US guided paracentesis Radiologist:  Zakarie Sturdivant Anesthesia:  10 ml of1% lidocaine Specimen:  6 L of yellow ascitic fluid EBL:   < 1 ml Complications: None  

## 2016-01-02 ENCOUNTER — Encounter: Payer: Self-pay | Admitting: Cardiology

## 2016-01-02 LAB — PATHOLOGIST SMEAR REVIEW

## 2016-01-02 NOTE — Progress Notes (Signed)
Letter  

## 2016-01-06 LAB — CULTURE, BODY FLUID-BOTTLE: CULTURE: NO GROWTH

## 2016-01-06 LAB — CULTURE, BODY FLUID W GRAM STAIN -BOTTLE

## 2016-01-07 ENCOUNTER — Ambulatory Visit: Payer: Self-pay | Admitting: Physician Assistant

## 2016-01-21 ENCOUNTER — Ambulatory Visit: Payer: Self-pay | Admitting: Physician Assistant

## 2016-01-22 ENCOUNTER — Ambulatory Visit: Payer: Self-pay | Admitting: Gastroenterology

## 2016-01-26 ENCOUNTER — Telehealth: Payer: Self-pay | Admitting: Cardiology

## 2016-01-26 NOTE — Telephone Encounter (Signed)
Scheduler spoke w/ pt wife and informed her that pt remote transmission was not been received since 10-01-2015. Pt wife wanted to know if it is exteremly important that remote transmission is completed. Pt is on hospice and she is not sure how much time he has left. I informed scheduler to tell pt wife that if she is unable to send remote transmission in the case that the pt expires it is ok. Scheduler informed pt wife of this.

## 2016-01-28 LAB — ACID FAST CULTURE WITH REFLEXED SENSITIVITIES

## 2016-01-28 LAB — ACID FAST CULTURE WITH REFLEXED SENSITIVITIES (MYCOBACTERIA): Acid Fast Culture: NEGATIVE

## 2016-02-03 ENCOUNTER — Ambulatory Visit (INDEPENDENT_AMBULATORY_CARE_PROVIDER_SITE_OTHER): Payer: Self-pay | Admitting: Internal Medicine

## 2016-03-03 DEATH — deceased

## 2016-03-15 ENCOUNTER — Encounter: Payer: Self-pay | Admitting: Internal Medicine

## 2016-03-29 ENCOUNTER — Ambulatory Visit: Payer: Self-pay | Admitting: Gastroenterology

## 2017-08-15 IMAGING — DX DG CHEST 1V
1 series · 1 of 1 positions shown · non-contrast
Comparison: 02/28/2015

CLINICAL DATA: Trip and fall in a parking lot this evening.
Anterior chest pain.

EXAM:
CHEST 1 VIEW

[chest ap]
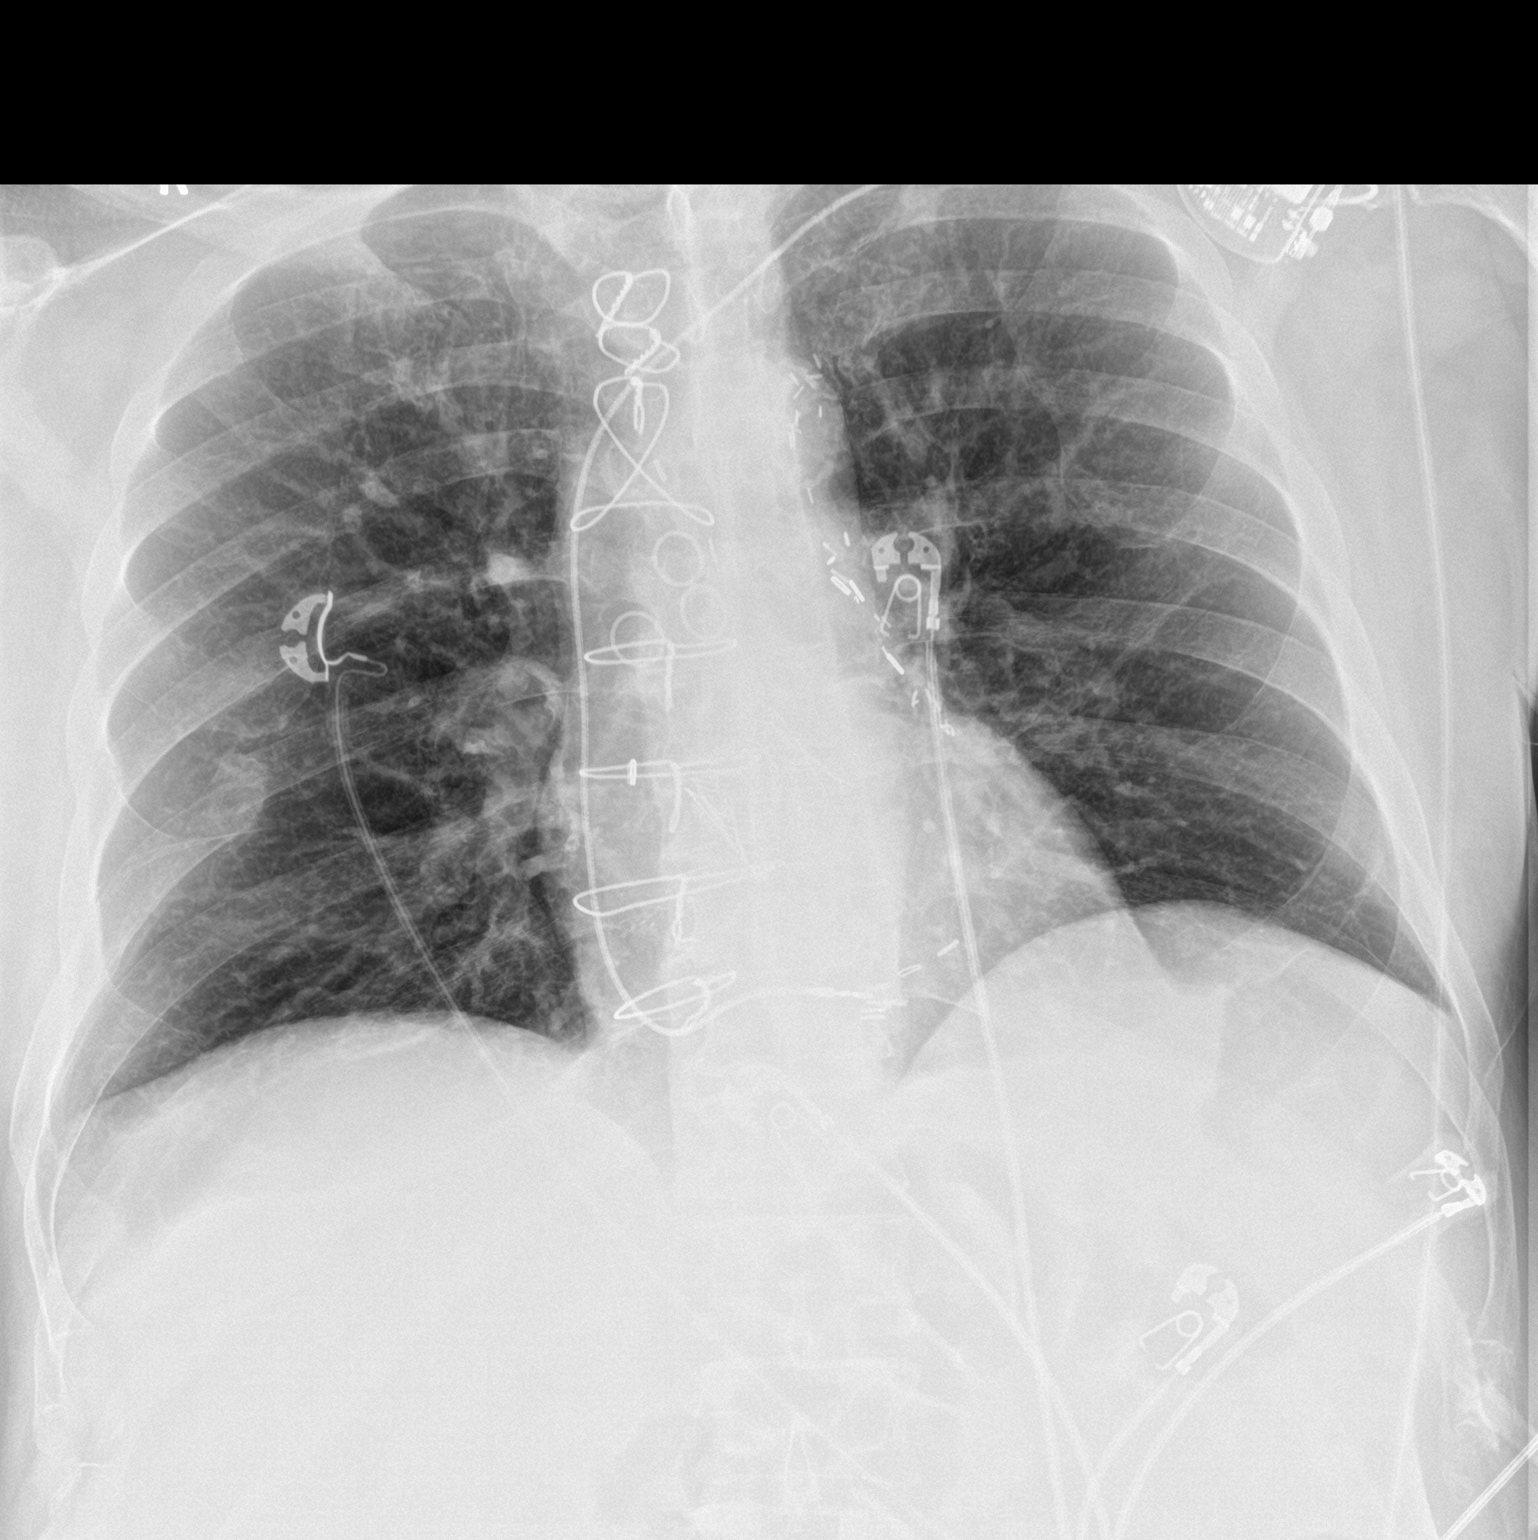

[1 of 1 positions shown; findings below may reference images not displayed]

FINDINGS: Post median sternotomy and CABG and aortic valve rib. Single lead
pacemaker remains in place with lead projecting over the right
ventricle. Heart is normal in size. Chronic interstitial coarsening.
No pneumothorax. No large pleural effusion or focal airspace
disease. Remote right rib fractures are again seen. No acute osseous
abnormality. Postsurgical change in the cervical spine, partially
included.
IMPRESSION: No acute process.

## 2017-08-15 IMAGING — DX DG FEMUR 2+V*R*
4 series · 4 of 4 positions shown · non-contrast
Comparison: None.

CLINICAL DATA: Trip and fall in a parking lot this evening. Mid
right thigh pain.

EXAM:
RIGHT FEMUR 2 VIEWS

[femur ap (1 of 2)]
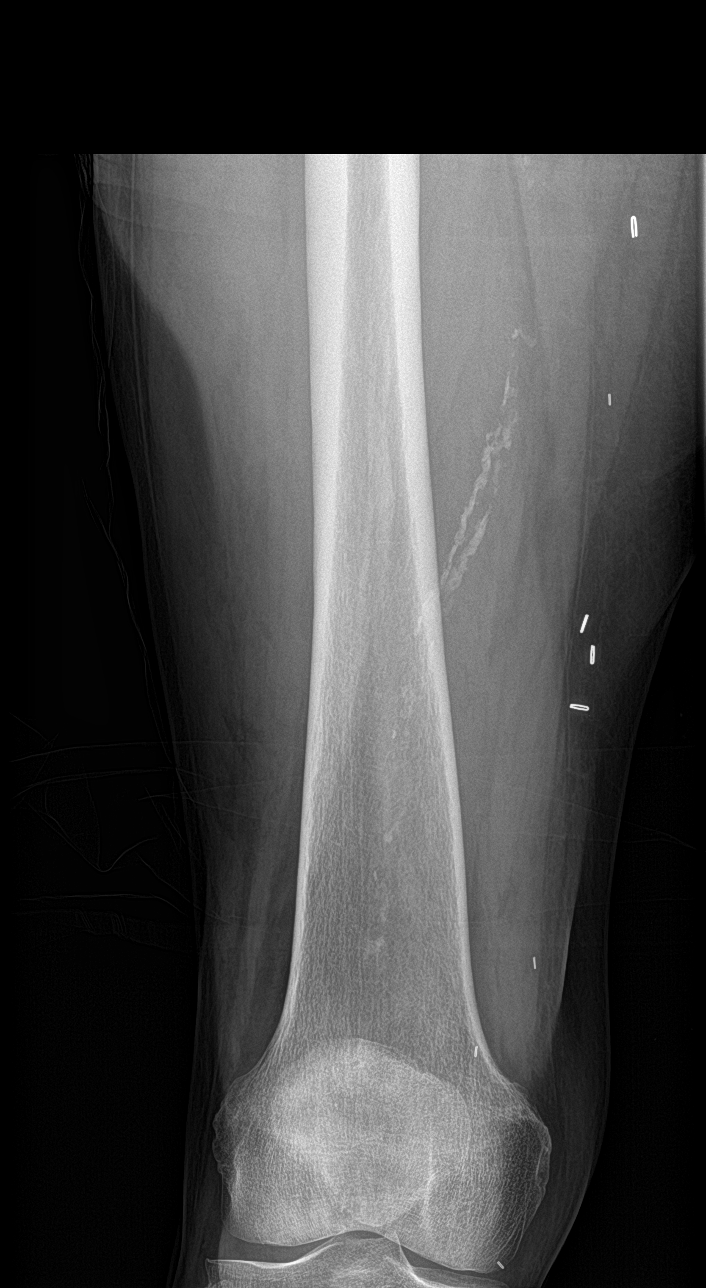

[femur lat (1 of 2)]
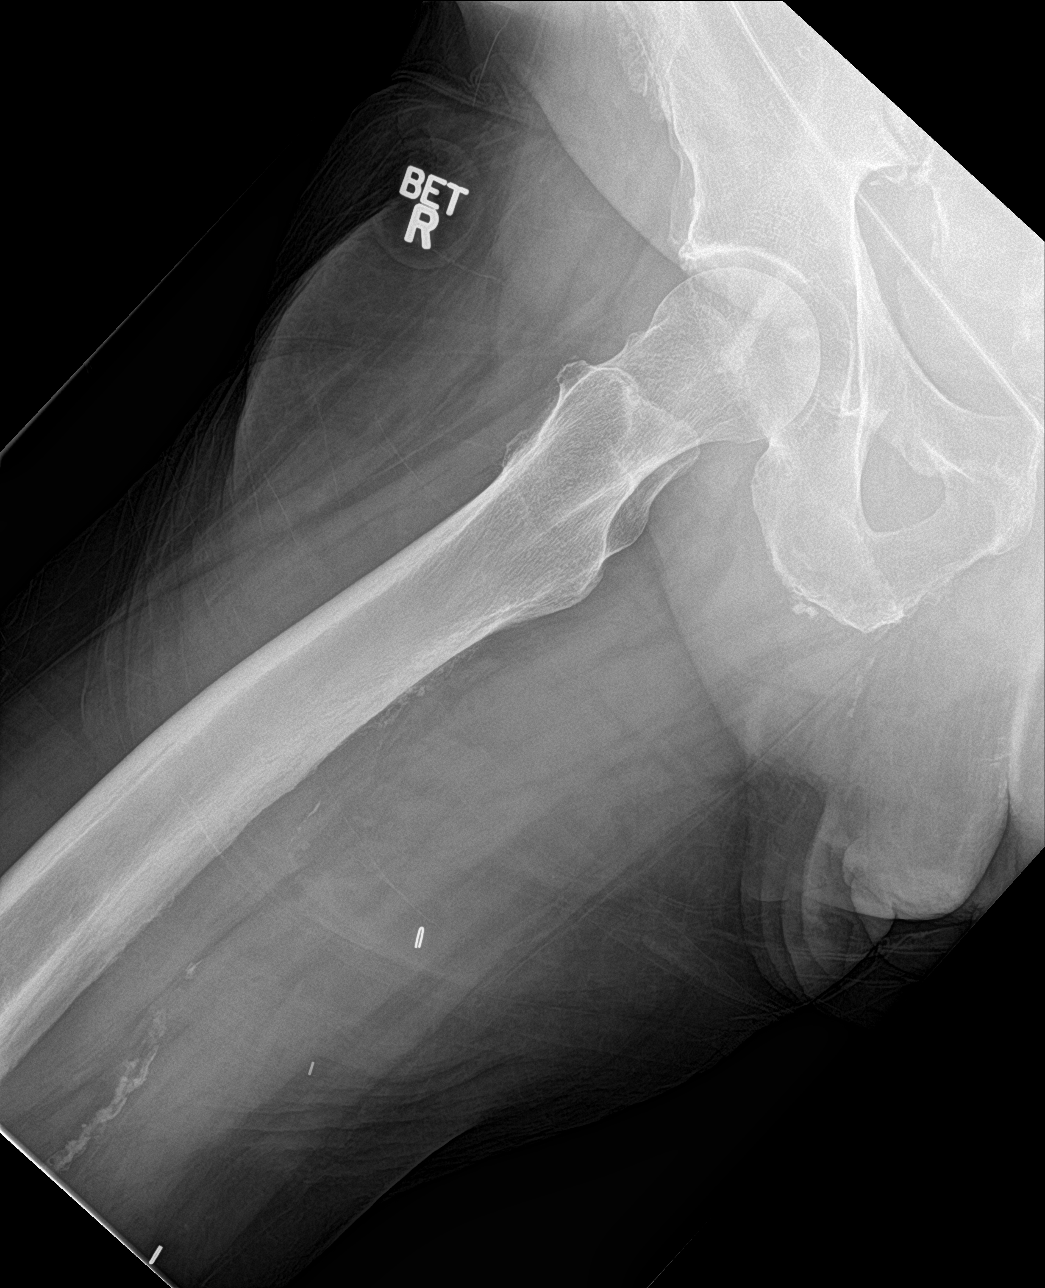

[femur lat (2 of 2)]
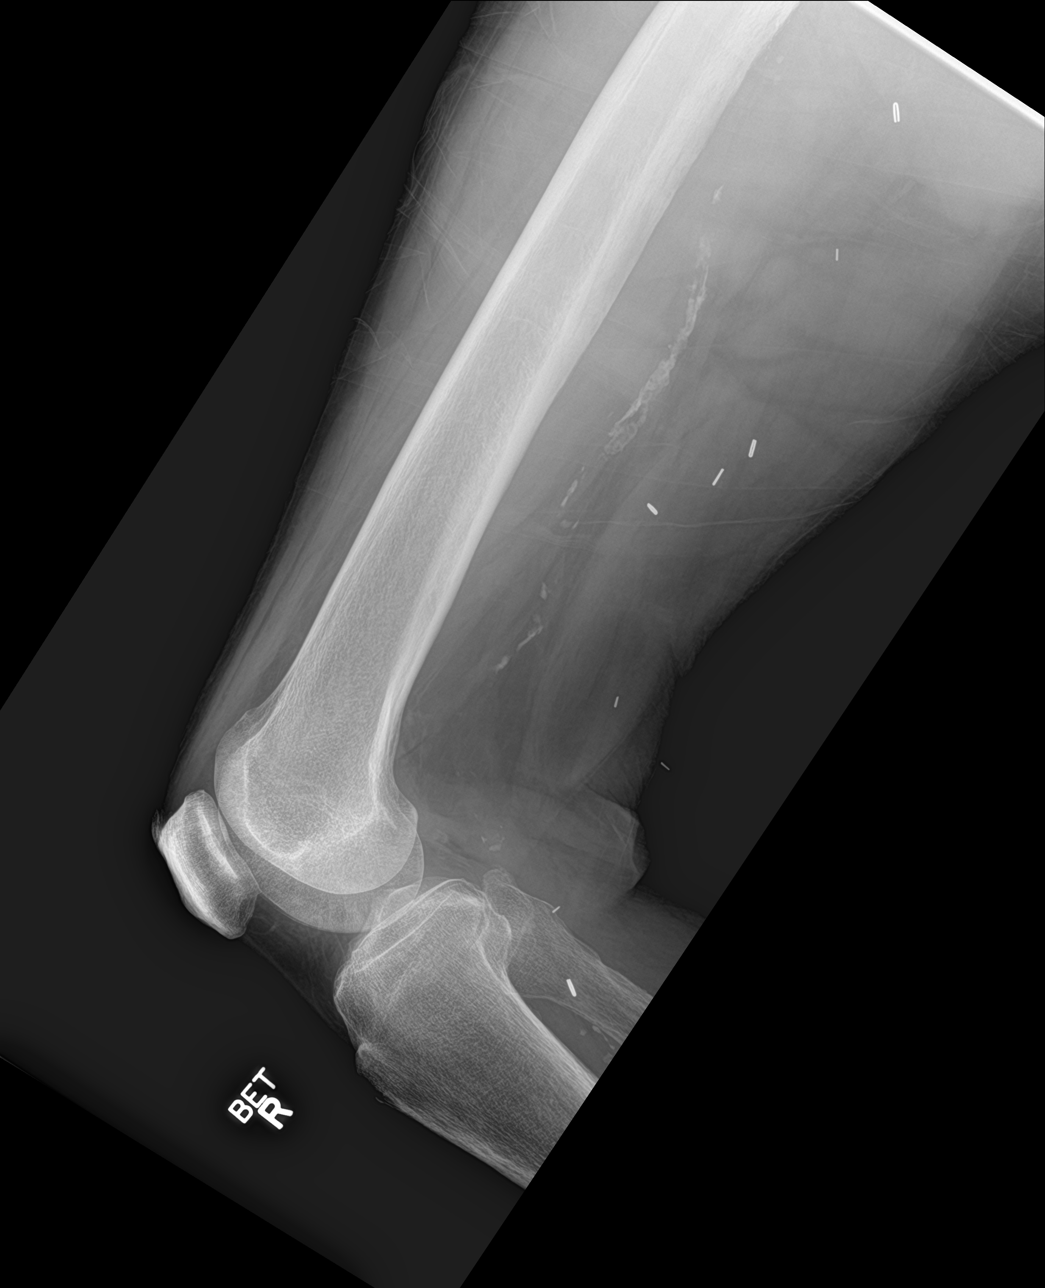

[femur ap (2 of 2)]
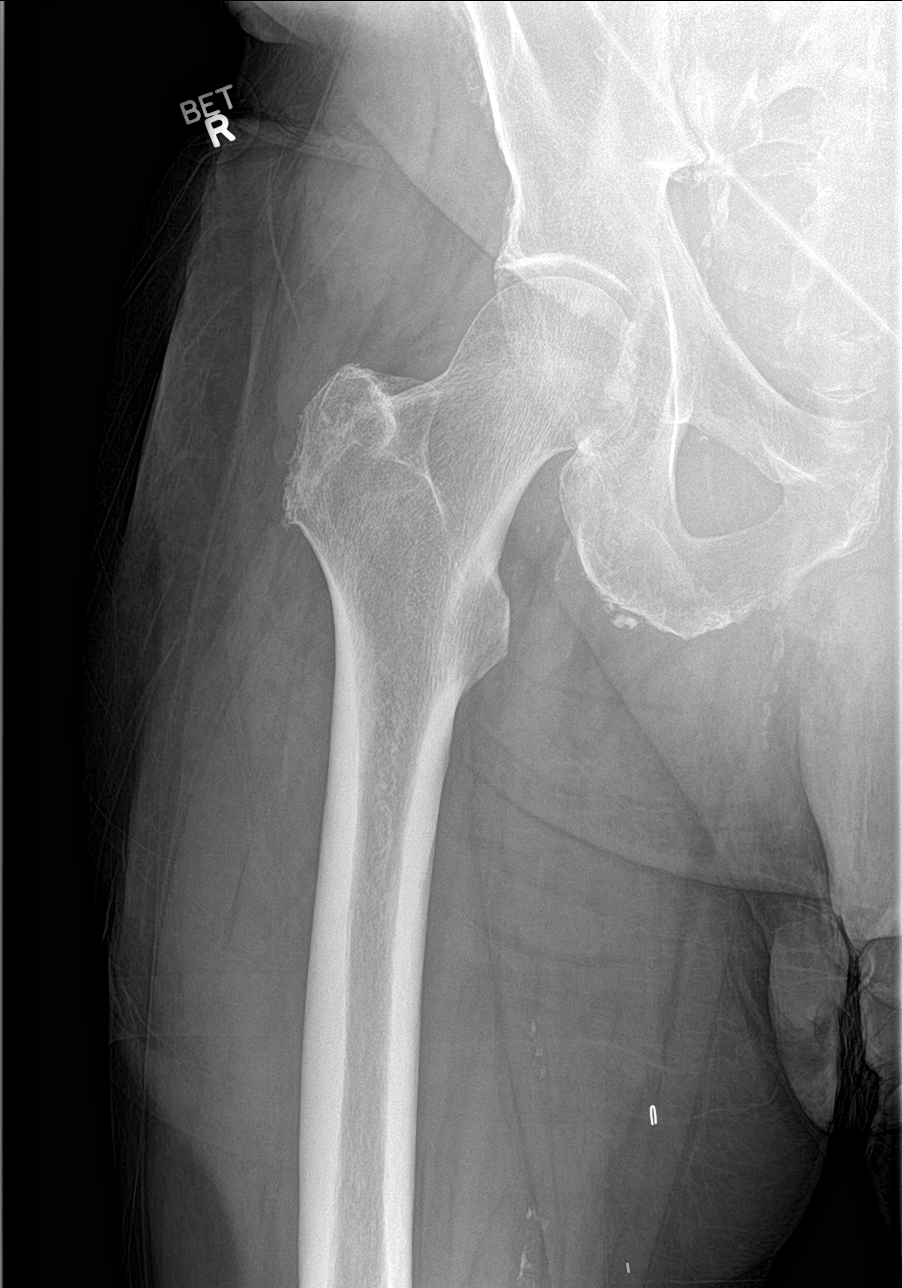

[4 of 4 positions shown; findings below may reference images not displayed]

FINDINGS: There is no evidence of fracture or other focal bone lesions. Hip
and knee alignment is maintained. Vascular calcifications are seen.
Surgical clips in the medial soft tissues.
IMPRESSION: No right femur fracture.
